# Patient Record
Sex: Female | Born: 1960
Health system: Southern US, Community
[De-identification: ages and names within clinical notes are randomized; demographics above are authoritative.]

## PROBLEM LIST (undated history)

## (undated) DIAGNOSIS — F32A Depression, unspecified: Secondary | ICD-10-CM

## (undated) DIAGNOSIS — F419 Anxiety disorder, unspecified: Secondary | ICD-10-CM

## (undated) DIAGNOSIS — Z9889 Other specified postprocedural states: Secondary | ICD-10-CM

## (undated) DIAGNOSIS — E876 Hypokalemia: Secondary | ICD-10-CM

## (undated) DIAGNOSIS — R112 Nausea with vomiting, unspecified: Secondary | ICD-10-CM

## (undated) HISTORY — PX: AUGMENTATION MAMMAPLASTY: SUR837

## (undated) HISTORY — PX: BACK SURGERY: SHX140

## (undated) HISTORY — DX: Hypokalemia: E87.6

## (undated) HISTORY — PX: BREAST BIOPSY: SHX20

## (undated) HISTORY — PX: FRACTURE SURGERY: SHX138

---

## 1994-11-20 HISTORY — PX: AUGMENTATION MAMMAPLASTY: SUR837

## 2016-05-15 MED FILL — ATOMOXETINE HCL 80 MG CAP: 80 | 30 days supply | Qty: 30 | Fill #0

## 2016-05-15 MED FILL — traZODone HCL 50 MG TABS: 50 | 30 days supply | Qty: 30 | Fill #0

## 2016-06-06 MED FILL — FLUoxetine HCL 20 MG CAPS: 20 | 30 days supply | Qty: 60 | Fill #0

## 2016-06-06 MED FILL — busPIRone HCL 5 MG TABS: 5 | 30 days supply | Qty: 90 | Fill #0

## 2016-06-13 DIAGNOSIS — F5101 Primary insomnia: Secondary | ICD-10-CM | POA: Diagnosis not present

## 2016-06-13 DIAGNOSIS — F411 Generalized anxiety disorder: Secondary | ICD-10-CM | POA: Diagnosis not present

## 2016-06-13 DIAGNOSIS — F1021 Alcohol dependence, in remission: Secondary | ICD-10-CM | POA: Diagnosis not present

## 2016-06-13 DIAGNOSIS — Z79890 Hormone replacement therapy (postmenopausal): Secondary | ICD-10-CM | POA: Diagnosis not present

## 2016-06-21 MED FILL — traZODone HCL 50 MG TABS: 50 | 30 days supply | Qty: 30 | Fill #0

## 2016-07-19 MED FILL — traZODone HCL 50 MG TABS: 50 | 30 days supply | Qty: 30 | Fill #1

## 2016-07-20 MED FILL — FLUoxetine HCL 20 MG CAPS: 20 | 30 days supply | Qty: 60 | Fill #0

## 2016-07-21 MED FILL — DEXTROAMP-AMPHET ER 15 MG C: 15 | 30 days supply | Qty: 30 | Fill #0

## 2016-08-21 MED FILL — traZODone HCL 50 MG TABS: 50 | 30 days supply | Qty: 30 | Fill #2

## 2016-08-21 MED FILL — FLUoxetine HCL 20 MG CAPS: 20 | 30 days supply | Qty: 60 | Fill #1

## 2016-08-31 MED FILL — DEXTROAMP-AMPHET ER 15 MG C: 15 | 30 days supply | Qty: 30 | Fill #0

## 2016-09-01 MED FILL — PROGESTERONE 100 MG CAPSULE: 100 | 30 days supply | Qty: 30 | Fill #0

## 2016-09-01 MED FILL — ESTRADIOL 0.05 MG PATCH: 0.05 | 28 days supply | Qty: 8 | Fill #0

## 2016-10-02 MED FILL — PROGESTERONE 100 MG CAPSULE: 100 | 30 days supply | Qty: 30 | Fill #1

## 2016-10-02 MED FILL — busPIRone HCL 5 MG TABS: 5 | 30 days supply | Qty: 90 | Fill #1

## 2016-10-02 MED FILL — traZODone HCL 50 MG TABS: 50 | 30 days supply | Qty: 30 | Fill #3

## 2016-10-23 MED FILL — DEXTROAMP-AMPHET ER 15 MG C: 15 | 30 days supply | Qty: 30 | Fill #0

## 2016-10-23 MED FILL — FLUoxetine HCL 20 MG CAPS: 20 | 30 days supply | Qty: 60 | Fill #2

## 2016-10-23 MED FILL — ESTRADIOL 0.05 MG PATCH: 0.05 | 28 days supply | Qty: 8 | Fill #1

## 2016-11-14 MED FILL — PROGESTERONE 100 MG CAPSULE: 100 | 30 days supply | Qty: 30 | Fill #2

## 2016-11-14 MED FILL — traZODone HCL 50 MG TABS: 50 | 30 days supply | Qty: 30 | Fill #4

## 2016-11-30 MED FILL — ESTRADIOL 0.05 MG PATCH: 0.05 | 28 days supply | Qty: 8 | Fill #2

## 2016-12-27 DIAGNOSIS — Z5181 Encounter for therapeutic drug level monitoring: Secondary | ICD-10-CM | POA: Diagnosis not present

## 2016-12-27 DIAGNOSIS — E782 Mixed hyperlipidemia: Secondary | ICD-10-CM | POA: Diagnosis not present

## 2016-12-27 DIAGNOSIS — F411 Generalized anxiety disorder: Secondary | ICD-10-CM | POA: Diagnosis not present

## 2016-12-27 DIAGNOSIS — Z0000 Encounter for general adult medical examination without abnormal findings: Secondary | ICD-10-CM | POA: Diagnosis not present

## 2016-12-27 DIAGNOSIS — H9313 Tinnitus, bilateral: Secondary | ICD-10-CM | POA: Diagnosis not present

## 2016-12-27 DIAGNOSIS — I7300 Raynaud's syndrome without gangrene: Secondary | ICD-10-CM | POA: Diagnosis not present

## 2016-12-27 DIAGNOSIS — F1021 Alcohol dependence, in remission: Secondary | ICD-10-CM | POA: Diagnosis not present

## 2016-12-27 MED FILL — GABAPENTIN 300 MG CAPSULE: 300 | 30 days supply | Qty: 90 | Fill #0

## 2017-01-01 MED FILL — PROGESTERONE 100 MG CAPSULE: 100 | 30 days supply | Qty: 30 | Fill #3

## 2017-01-01 MED FILL — traZODone HCL 50 MG TABS: 50 | 30 days supply | Qty: 30 | Fill #5

## 2017-01-24 MED FILL — FLUoxetine HCL 20 MG CAPS: 20 | 30 days supply | Qty: 60 | Fill #3

## 2017-01-25 MED FILL — PROGESTERONE 100 MG CAPSULE: 100 | 30 days supply | Qty: 30 | Fill #4

## 2017-02-26 MED FILL — traZODone HCL 50 MG TABS: 50 | 30 days supply | Qty: 30 | Fill #6

## 2017-02-26 MED FILL — PROGESTERONE 100 MG CAPSULE: 100 | 30 days supply | Qty: 30 | Fill #5

## 2017-02-26 MED FILL — GABAPENTIN 300 MG CAPSULE: 300 | 30 days supply | Qty: 90 | Fill #1

## 2017-02-26 MED FILL — FLUoxetine HCL 20 MG CAPS: 20 | 30 days supply | Qty: 60 | Fill #4

## 2017-02-26 MED FILL — ESTRADIOL 0.05 MG PATCH: 0.05 | 28 days supply | Qty: 8 | Fill #3

## 2017-05-03 MED FILL — ESTRADIOL 0.05 MG PATCH: 0.05 | 28 days supply | Qty: 8 | Fill #4

## 2017-05-03 MED FILL — GABAPENTIN 300 MG CAPSULE: 300 | 30 days supply | Qty: 90 | Fill #2

## 2017-05-03 MED FILL — FLUoxetine HCL 20 MG CAPS: 20 | 30 days supply | Qty: 60 | Fill #5

## 2017-05-03 MED FILL — traZODone HCL 50 MG TABS: 50 | 30 days supply | Qty: 30 | Fill #7

## 2017-07-10 MED FILL — traZODone HCL 50 MG TABS: 50 | 30 days supply | Qty: 30 | Fill #0

## 2017-07-10 MED FILL — GABAPENTIN 300 MG CAPSULE: 300 | 30 days supply | Qty: 90 | Fill #3

## 2017-07-10 MED FILL — ESTRADIOL 0.05 MG PATCH: 0.05 | 28 days supply | Qty: 8 | Fill #5

## 2017-07-10 MED FILL — FLUoxetine HCL 20 MG CAPS: 20 | 30 days supply | Qty: 60 | Fill #6

## 2017-08-14 DIAGNOSIS — L259 Unspecified contact dermatitis, unspecified cause: Secondary | ICD-10-CM | POA: Diagnosis not present

## 2017-08-14 DIAGNOSIS — F5101 Primary insomnia: Secondary | ICD-10-CM | POA: Diagnosis not present

## 2017-08-14 MED FILL — CLOBETASOL PROP 0.05% SPRAY: 0.05 | 14 days supply | Qty: 59 | Fill #0

## 2017-08-14 MED FILL — clonazePAM 1 MG TABS: 1 | 30 days supply | Qty: 30 | Fill #0

## 2017-09-17 MED FILL — GABAPENTIN 300 MG CAPSULE: 300 | 30 days supply | Qty: 90 | Fill #4

## 2017-09-17 MED FILL — traZODone HCL 50 MG TABS: 50 | 30 days supply | Qty: 30 | Fill #1

## 2017-09-17 MED FILL — FLUoxetine HCL 20 MG CAPS: 20 | 30 days supply | Qty: 60 | Fill #0

## 2017-09-27 ENCOUNTER — Encounter (HOSPITAL_COMMUNITY): Payer: Self-pay | Admitting: Emergency Medicine

## 2017-09-27 ENCOUNTER — Ambulatory Visit (HOSPITAL_COMMUNITY)
Admission: EM | Admit: 2017-09-27 | Discharge: 2017-09-27 | Disposition: A | Discharge status: Home / Self Care | Payer: 59 | Attending: Family Medicine | Admitting: Family Medicine

## 2017-09-27 DIAGNOSIS — H10013 Acute follicular conjunctivitis, bilateral: Secondary | ICD-10-CM

## 2017-09-27 HISTORY — DX: Anxiety disorder, unspecified: F41.9

## 2017-09-27 MED ORDER — TOBRAMYCIN-DEXAMETHASONE 0.3-0.1 % OP SUSP: 1.0000 [drp] | Freq: Three times a day (TID) | OPHTHALMIC | 0 refills | Status: DC

## 2017-09-27 NOTE — ED Provider Notes (Signed)
  [place]   [id number] 09/27/17 Arrival Time: 1905   SUBJECTIVE:  Debra Rhodes is a 56 y.o. female who presents to the urgent care with complaint of bilateral eye redness and itching for 3 weeks. PT assumed it was allergies, but PT has worsened itching and pain.      Past Medical History:  Diagnosis Date  . Anxiety    No family history on file. Social History   Socioeconomic History  . Marital status: Single    Spouse name: Not on file  . Number of children: Not on file  . Years of education: Not on file  . Highest education level: Not on file  Social Needs  . Financial resource strain: Not on file  . Food insecurity - worry: Not on file  . Food insecurity - inability: Not on file  . Transportation needs - medical: Not on file  . Transportation needs - non-medical: Not on file  Occupational History  . Not on file  Tobacco Use  . Smoking status: Never Smoker  . Smokeless tobacco: Never Used  Substance and Sexual Activity  . Alcohol use: No    Frequency: Never  . Drug use: No  . Sexual activity: Not on file  Other Topics Concern  . Not on file  Social History Narrative  . Not on file   Current Meds  Medication Sig  . fexofenadine (ALLEGRA) 30 MG tablet Take 30 mg 2 (two) times daily by mouth.  Marland Kitchen FLUoxetine (PROZAC) 20 MG tablet Take 20 mg daily by mouth.  . traZODone (DESYREL) 50 MG tablet Take 50 mg at bedtime by mouth.   Allergies  Allergen Reactions  . Latex Rash      ROS: As per HPI, remainder of ROS negative.   OBJECTIVE:   Vitals:   09/27/17 1938  BP: 120/76  Pulse: (!) 50  Resp: 16  Temp: 98.2 F (36.8 C)  TempSrc: Oral  SpO2: 100%  Weight: 127 lb (57.6 kg)  Height: 5\' 3"  (1.6 m)     General appearance: alert; no distress Eyes: PERRL; EOMI; conjunctiva normal HENT: normocephalic; atraumatic; TMs normal, canal normal, external ears normal without trauma; nasal mucosa normal; oral mucosa normal Neck: supple Lungs:  clear to auscultation bilaterally Heart: regular rate and rhythm Abdomen: soft, non-tender; bowel sounds normal; no masses or organomegaly; no guarding or rebound tenderness Back: no CVA tenderness Extremities: no cyanosis or edema; symmetrical with no gross deformities Skin: warm and dry Neurologic: normal gait; grossly normal Psychological: alert and cooperative; normal mood and affect      Labs:  No results found for this or any previous visit.  Labs Reviewed - No data to display  No results found.     ASSESSMENT & PLAN:  No diagnosis found.  Meds ordered this encounter  Medications  . FLUoxetine (PROZAC) 20 MG tablet    Sig: Take 20 mg daily by mouth.  . traZODone (DESYREL) 50 MG tablet    Sig: Take 50 mg at bedtime by mouth.  . fexofenadine (ALLEGRA) 30 MG tablet    Sig: Take 30 mg 2 (two) times daily by mouth.    Reviewed expectations re: course of current medical issues. Questions answered. Outlined signs and symptoms indicating need for more acute intervention. Patient verbalized understanding. After Visit Summary given.    Procedures:      Robyn Haber, MD 09/27/17 2045

## 2017-09-27 NOTE — ED Triage Notes (Signed)
PT reports bilateral eye redness and itching for 3 weeks. PT assumed it was allergies, but PT has worsened itching and pain.

## 2017-10-30 MED FILL — FLUoxetine HCL 20 MG CAPS: 20 | 30 days supply | Qty: 60 | Fill #1

## 2017-10-30 MED FILL — traZODone HCL 50 MG TABS: 50 | 30 days supply | Qty: 30 | Fill #2

## 2017-10-30 MED FILL — GABAPENTIN 300 MG CAPSULE: 300 | 30 days supply | Qty: 90 | Fill #5

## 2017-11-27 MED FILL — traZODone HCL 50 MG TABS: 50 | 30 days supply | Qty: 30 | Fill #3

## 2017-12-12 ENCOUNTER — Ambulatory Visit: Payer: Self-pay | Admitting: Emergency Medicine

## 2017-12-12 VITALS — BP 100/75 | HR 52 | Temp 97.4°F | Resp 16 | Wt 131.6 lb

## 2017-12-12 DIAGNOSIS — J40 Bronchitis, not specified as acute or chronic: Secondary | ICD-10-CM

## 2017-12-12 MED ORDER — PREDNISONE 50 MG PO TABS: ORAL_TABLET | ORAL | 0 refills | Status: DC

## 2017-12-12 MED ORDER — GUAIFENESIN-CODEINE 100-10 MG/5ML PO SOLN: 5.0000 mL | Freq: Four times a day (QID) | ORAL | 0 refills | Status: DC | PRN

## 2017-12-12 MED ORDER — AZITHROMYCIN 250 MG PO TABS: ORAL_TABLET | ORAL | 0 refills | Status: DC

## 2017-12-12 NOTE — Patient Instructions (Signed)

## 2017-12-12 NOTE — Progress Notes (Signed)
Subjective:     Debra Rhodes is a 57 y.o. female who presents for evaluation of symptoms of a URI. Symptoms include cough described as productive of green sputum and fatigue. Onset of symptoms was 2 weeks ago, and has been gradually worsening since that time. Treatment to date: antihistamines, cough suppressants and decongestants.  The following portions of the patient's history were reviewed and updated as appropriate: allergies and current medications.  Review of Systems Pertinent items noted in HPI and remainder of comprehensive ROS otherwise negative.   Objective:    BP 100/75 (BP Location: Right Arm, Patient Position: Sitting, Cuff Size: Normal)   Pulse (!) 52   Temp (!) 97.4 F (36.3 C) (Oral)   Resp 16   Wt 131 lb 9.6 oz (59.7 kg)   SpO2 99%   BMI 23.31 kg/m  General appearance: alert, cooperative and appears stated age Head: Normocephalic, without obvious abnormality, atraumatic Eyes: negative Ears: normal TM's and external ear canals both ears Nose: Nares normal. Septum midline. Mucosa normal. No drainage or sinus tenderness. Throat: lips, mucosa, and tongue normal; teeth and gums normal Lungs: clear to auscultation bilaterally Heart: regular rate and rhythm Pulses: 2+ and symmetric Skin: Skin color, texture, turgor normal. No rashes or lesions   Assessment:    bronchitis and viral upper respiratory illness   Plan:    Suggested symptomatic OTC remedies. Nasal saline spray for congestion. Zithromax per orders. Follow up as needed.   Counseling provided on avoiding driving or sedating medications while taking codeine.

## 2017-12-26 MED FILL — GABAPENTIN 300 MG CAPSULE: 300 | 30 days supply | Qty: 90 | Fill #6

## 2017-12-28 MED FILL — traZODone HCL 50 MG TABS: 50 | 30 days supply | Qty: 30 | Fill #4

## 2018-01-09 ENCOUNTER — Telehealth: Payer: Self-pay

## 2018-01-09 DIAGNOSIS — F988 Other specified behavioral and emotional disorders with onset usually occurring in childhood and adolescence: Secondary | ICD-10-CM | POA: Diagnosis not present

## 2018-01-09 DIAGNOSIS — Z0000 Encounter for general adult medical examination without abnormal findings: Secondary | ICD-10-CM | POA: Diagnosis not present

## 2018-01-09 DIAGNOSIS — E782 Mixed hyperlipidemia: Secondary | ICD-10-CM | POA: Diagnosis not present

## 2018-01-09 DIAGNOSIS — Z5181 Encounter for therapeutic drug level monitoring: Secondary | ICD-10-CM | POA: Diagnosis not present

## 2018-01-09 DIAGNOSIS — R001 Bradycardia, unspecified: Secondary | ICD-10-CM | POA: Diagnosis not present

## 2018-01-09 NOTE — Telephone Encounter (Signed)
SENT REFERRAL TO SCHEDULING 

## 2018-01-10 MED FILL — METHYLPHENIDATE 5 MG TABLET: 5 | 30 days supply | Qty: 60 | Fill #0

## 2018-01-15 ENCOUNTER — Other Ambulatory Visit: Payer: Self-pay | Admitting: Family Medicine

## 2018-01-15 DIAGNOSIS — Z139 Encounter for screening, unspecified: Secondary | ICD-10-CM

## 2018-01-30 MED FILL — traZODone HCL 50 MG TABS: 50 | 30 days supply | Qty: 30 | Fill #5

## 2018-02-05 ENCOUNTER — Ambulatory Visit
Admission: RE | Admit: 2018-02-05 | Discharge: 2018-02-05 | Disposition: A | Discharge status: Home / Self Care | Payer: 59 | Source: Ambulatory Visit | Attending: Family Medicine | Admitting: Family Medicine

## 2018-02-05 DIAGNOSIS — Z139 Encounter for screening, unspecified: Secondary | ICD-10-CM

## 2018-02-05 DIAGNOSIS — Z1231 Encounter for screening mammogram for malignant neoplasm of breast: Secondary | ICD-10-CM | POA: Diagnosis not present

## 2018-02-26 MED FILL — traZODone HCL 50 MG TABS: 50 | 30 days supply | Qty: 30 | Fill #6

## 2018-02-27 MED FILL — GABAPENTIN 300 MG CAPSULE: 300 | 30 days supply | Qty: 90 | Fill #0

## 2018-03-13 MED FILL — PARoxetine HCL 20 MG TABS: 20 | 30 days supply | Qty: 30 | Fill #0

## 2018-04-01 MED FILL — traZODone HCL 50 MG TABS: 50 | 30 days supply | Qty: 30 | Fill #7

## 2018-04-30 MED FILL — traZODone HCL 50 MG TABS: 50 | 30 days supply | Qty: 30 | Fill #8

## 2018-04-30 MED FILL — PARoxetine HCL 20 MG TABS: 20 | 30 days supply | Qty: 30 | Fill #1

## 2018-06-05 MED FILL — PARoxetine HCL 20 MG TABS: 20 | 30 days supply | Qty: 30 | Fill #2

## 2018-06-05 MED FILL — traZODone HCL 50 MG TABS: 50 | 30 days supply | Qty: 30 | Fill #9

## 2018-07-08 MED FILL — traZODone HCL 50 MG TABS: 50 | 30 days supply | Qty: 30 | Fill #10

## 2018-07-08 MED FILL — GABAPENTIN 300 MG CAPSULE: 300 | 30 days supply | Qty: 90 | Fill #1

## 2018-08-14 MED FILL — metroNIDAZOLE 500 MG TABS: 500 | 7 days supply | Qty: 14 | Fill #0

## 2018-08-14 MED FILL — FLUCONAZOLE 150 MG TABS: 150 | 4 days supply | Qty: 2 | Fill #0

## 2018-08-15 MED FILL — traZODone HCL 50 MG TABS: 50 | 30 days supply | Qty: 30 | Fill #0

## 2018-08-22 DIAGNOSIS — Z23 Encounter for immunization: Secondary | ICD-10-CM | POA: Diagnosis not present

## 2018-08-22 DIAGNOSIS — Q828 Other specified congenital malformations of skin: Secondary | ICD-10-CM | POA: Diagnosis not present

## 2018-08-22 DIAGNOSIS — D485 Neoplasm of uncertain behavior of skin: Secondary | ICD-10-CM | POA: Diagnosis not present

## 2018-09-19 MED FILL — PARoxetine HCL 20 MG TABS: 20 | 30 days supply | Qty: 30 | Fill #0

## 2018-09-19 MED FILL — traZODone HCL 50 MG TABS: 50 | 30 days supply | Qty: 30 | Fill #1

## 2018-10-07 DIAGNOSIS — F988 Other specified behavioral and emotional disorders with onset usually occurring in childhood and adolescence: Secondary | ICD-10-CM | POA: Diagnosis not present

## 2018-10-07 DIAGNOSIS — R5382 Chronic fatigue, unspecified: Secondary | ICD-10-CM | POA: Diagnosis not present

## 2018-10-07 DIAGNOSIS — Z1211 Encounter for screening for malignant neoplasm of colon: Secondary | ICD-10-CM | POA: Diagnosis not present

## 2018-10-21 MED FILL — traZODone HCL 50 MG TABS: 50 | 30 days supply | Qty: 30 | Fill #2

## 2018-11-21 MED FILL — traZODone HCL 50 MG TABS: 50 | 30 days supply | Qty: 30 | Fill #3

## 2018-11-21 MED FILL — PARoxetine HCL 20 MG TABS: 20 | 30 days supply | Qty: 30 | Fill #1

## 2018-12-27 MED FILL — traZODone HCL 50 MG TABS: 50 | 30 days supply | Qty: 30 | Fill #4

## 2019-01-24 MED FILL — traZODone HCL 50 MG TABS: 50 | 30 days supply | Qty: 30 | Fill #5

## 2019-01-24 MED FILL — OLOPATADINE HCL 0.1% EYE DR: 0.1 | 25 days supply | Qty: 5 | Fill #0

## 2019-01-24 MED FILL — PARoxetine HCL 20 MG TABS: 20 | 30 days supply | Qty: 30 | Fill #2

## 2019-02-03 MED FILL — traMADol HCL 50 MG TABS: 50 | 10 days supply | Qty: 40 | Fill #0

## 2019-02-03 MED FILL — predniSONE 20 MG TABS: 20 | 5 days supply | Qty: 10 | Fill #0

## 2019-02-04 DIAGNOSIS — M25561 Pain in right knee: Secondary | ICD-10-CM | POA: Insufficient documentation

## 2019-02-04 HISTORY — DX: Pain in right knee: M25.561

## 2019-02-10 DIAGNOSIS — M7120 Synovial cyst of popliteal space [Baker], unspecified knee: Secondary | ICD-10-CM | POA: Insufficient documentation

## 2019-02-10 MED FILL — OLOPATADINE HCL 0.1 % SOLN: 0.1 | 25 days supply | Qty: 5 | Fill #1

## 2019-02-10 MED FILL — GABAPENTIN 300 MG CAPSULE: 300 | 30 days supply | Qty: 90 | Fill #0

## 2019-02-10 MED FILL — PARoxetine HCL 20 MG TABS: 20 | 30 days supply | Qty: 30 | Fill #3

## 2019-02-11 MED FILL — traZODone HCL 100 MG TABS: 100 | 90 days supply | Qty: 90 | Fill #0

## 2019-02-17 ENCOUNTER — Other Ambulatory Visit (HOSPITAL_COMMUNITY)
Admission: RE | Admit: 2019-02-17 | Discharge: 2019-02-17 | Disposition: A | Discharge status: Home / Self Care | Payer: No Typology Code available for payment source | Source: Ambulatory Visit | Attending: Family Medicine | Admitting: Family Medicine

## 2019-02-17 ENCOUNTER — Other Ambulatory Visit: Payer: Self-pay | Admitting: Family Medicine

## 2019-02-17 DIAGNOSIS — Z01411 Encounter for gynecological examination (general) (routine) with abnormal findings: Secondary | ICD-10-CM | POA: Insufficient documentation

## 2019-02-19 LAB — CYTOLOGY - PAP
Adequacy: ABSENT
Diagnosis: NEGATIVE
HPV: NOT DETECTED

## 2019-03-20 MED FILL — FLUCONAZOLE 150 MG TABS: 150 | 3 days supply | Qty: 2 | Fill #0

## 2019-03-20 MED FILL — METRONIDAZOLE 500 MG TABS: 500 | 7 days supply | Qty: 14 | Fill #0

## 2019-03-20 MED FILL — traMADol HCL 50 MG TABS: 50 | 10 days supply | Qty: 40 | Fill #1

## 2019-05-05 NOTE — H&P (Signed)
TOTAL KNEE ADMISSION H&P  Patient is being admitted for right total knee arthroplasty.  Subjective:  Chief Complaint:      Right knee primary OA / pain  HPI: Debra Rhodes, 58 y.o. female, has a history of pain and functional disability in the right knee due to arthritis and has failed non-surgical conservative treatments for greater than 12 weeks to includeNSAID's and/or analgesics, corticosteriod injections and activity modification.  Onset of symptoms was gradual, starting >10 years ago with gradually worsening course since that time. The patient noted no past surgery on the right knee(s).  Patient currently rates pain in the right knee(s) at 6 out of 10 with activity. Patient has night pain, worsening of pain with activity and weight bearing, pain that interferes with activities of daily living and pain with passive range of motion.  Patient has evidence of periarticular osteophytes and joint space narrowing by imaging studies.  There is no active infection. Risks, benefits and expectations were discussed with the patient.  Risks including but not limited to the risk of anesthesia, blood clots, nerve damage, blood vessel damage, failure of the prosthesis, infection and up to and including death.  Patient understand the risks, benefits and expectations and wishes to proceed with surgery.    PCP: Maurice Small, MD  D/C Plans:       Home   Post-op Meds:       No Rx given  Tranexamic Acid:      To be given - IV   Decadron:      Is to be given  FYI:     ASA  Norco  Low resting HR  DME:    Rx given for - RW & 3-n-1  PT:   OPPT  Pharmacy: [facility]    Past Medical History:  Diagnosis Date  . Anxiety     Past Surgical History:  Procedure Laterality Date  . AUGMENTATION MAMMAPLASTY Bilateral   . BACK SURGERY    . CESAREAN SECTION    . FRACTURE SURGERY      No current facility-administered medications for this encounter.    Current Outpatient Medications   Medication Sig Dispense Refill Last Dose  . Biotin w/ Vitamins C & E (HAIR/SKIN/NAILS PO) Take 1 tablet by mouth daily.     . fexofenadine (ALLEGRA) 180 MG tablet Take 180 mg by mouth daily.      Marland Kitchen gabapentin (NEURONTIN) 100 MG capsule Take 100 mg by mouth daily as needed (pain).      . Magnesium 500 MG TABS Take 500 mg by mouth daily.     Marland Kitchen PARoxetine (PAXIL) 20 MG tablet Take 20 mg by mouth daily.     . traZODone (DESYREL) 50 MG tablet Take 50-75 mg by mouth at bedtime.      Marland Kitchen azithromycin (ZITHROMAX) 250 MG tablet Take 2 tablets today, then one daily until finished (Patient not taking: Reported on 05/02/2019) 6 tablet 0 Not Taking at Unknown time  . guaiFENesin-codeine 100-10 MG/5ML syrup Take 5 mLs by mouth every 6 (six) hours as needed for cough. (Patient not taking: Reported on 05/02/2019) 120 mL 0 Not Taking at Unknown time  . predniSONE (DELTASONE) 50 MG tablet Take 1 tablet daily with food (Patient not taking: Reported on 05/02/2019) 5 tablet 0 Not Taking at Unknown time  . tobramycin-dexamethasone (TOBRADEX) ophthalmic solution Place 1 drop 3 (three) times daily into both eyes. (Patient not taking: Reported on 12/12/2017) 5 mL 0  Allergies  Allergen Reactions  . Latex Shortness Of Breath, Swelling and Rash    Social History   Tobacco Use  . Smoking status: Never Smoker  . Smokeless tobacco: Never Used  Substance Use Topics  . Alcohol use: No    Frequency: Never       Review of Systems  Constitutional: Negative.   HENT: Negative.   Eyes: Negative.   Respiratory: Negative.   Cardiovascular: Negative.   Gastrointestinal: Negative.   Genitourinary: Negative.   Musculoskeletal: Positive for joint pain.  Skin: Negative.   Neurological: Negative.   Endo/Heme/Allergies: Negative.   Psychiatric/Behavioral: The patient is nervous/anxious.     Objective:  Physical Exam  Constitutional: She is oriented to person, place, and time. She appears well-developed.  HENT:  Head:  Normocephalic.  Eyes: Pupils are equal, round, and reactive to light.  Neck: Neck supple. No JVD present. No tracheal deviation present. No thyromegaly present.  Cardiovascular: Normal rate, regular rhythm and intact distal pulses.  Respiratory: Effort normal and breath sounds normal. No respiratory distress. She has no wheezes.  GI: Soft. There is no abdominal tenderness. There is no guarding.  Musculoskeletal:     Right knee: She exhibits swelling and bony tenderness. She exhibits no ecchymosis, no deformity, no laceration and no erythema. Tenderness found.  Lymphadenopathy:    She has no cervical adenopathy.  Neurological: She is alert and oriented to person, place, and time. A sensory deficit (left foot numbness) is present.  Skin: Skin is warm and dry.  Psychiatric: She has a normal mood and affect.      Labs:  Estimated body mass index is 23.31 kg/m as calculated from the following:   Height as of 09/27/17: 5\' 3"  (1.6 m).   Weight as of 12/12/17: 59.7 kg.   Imaging Review  Plain radiographs demonstrate severe degenerative joint disease of the right knee(s).  The bone quality appears to be good for age and reported activity level.      Assessment/Plan:  End stage arthritis, right knee   The patient history, physical examination, clinical judgment of the provider and imaging studies are consistent with end stage degenerative joint disease of the right knee(s) and total knee arthroplasty is deemed medically necessary. The treatment options including medical management, injection therapy arthroscopy and arthroplasty were discussed at length. The risks and benefits of total knee arthroplasty were presented and reviewed. The risks due to aseptic loosening, infection, stiffness, patella tracking problems, thromboembolic complications and other imponderables were discussed. The patient acknowledged the explanation, agreed to proceed with the plan and consent was signed. Patient is  being admitted for inpatient treatment for surgery, pain control, PT, OT, prophylactic antibiotics, VTE prophylaxis, progressive ambulation and ADL's and discharge planning. The patient is planning to be discharged home.    Patient's anticipated LOS is less than 2 midnights, meeting these requirements: - Younger than 54 - Lives within 1 hour of care - Has a competent adult at home to recover with post-op recover - NO history of  - Chronic pain requiring opiods  - Diabetes  - Coronary Artery Disease  - Heart failure  - Heart attack  - Stroke  - DVT/VTE  - Cardiac arrhythmia  - Respiratory Failure/COPD  - Renal failure  - Anemia  - Advanced Liver disease    [place]. Nikia Mangino   PA-C  05/05/2019, 11:11 AM

## 2019-05-07 NOTE — Patient Instructions (Addendum)
Debra Rhodes    Your procedure is scheduled on: 05-13-2019   Report to [facility]  Entrance Report to admitting at 1:15 PM   YOU NEED TO HAVE A COVID 19 TEST ON_6/18______ @_12 :00______, THIS TEST MUST BE DONE BEFORE SURGERY, COME TO [place] ENTRANCE  ONCE YOUR COVID TEST IS COMPLETED, PLEASE BEGIN THE QUARANTINE INSTRUCTIONS AS OUTLINED IN YOUR HANDOUT.   Call this number if you have problems the morning of surgery [phone]    Remember:  [place], NO CHEWING GUM CANDY OR MINTS.   NO SOLID FOOD AFTER MIDNIGHT THE NIGHT PRIOR TO SURGERY. NOTHING BY MOUTH EXCEPT CLEAR LIQUIDS UNTIL 9563 PM. . PLEASE FINISH ENSURE DRINK PER SURGEON ORDER 3 HOURS PRIOR TO SCHEDULED SURGERY TIME WHICH NEEDS TO BE COMPLETED AT 1245 PM.    CLEAR LIQUID DIET   Foods Allowed                                                                     Foods Excluded  Coffee and tea, regular and decaf                             liquids that you cannot  Plain Jell-O in any flavor                                             see through such as: Fruit ices (not with fruit pulp)                                     milk, soups, orange juice  Iced Popsicles                                    All solid food Carbonated beverages, regular and diet                                    Cranberry, grape and apple juices Sports drinks like Gatorade Lightly seasoned clear broth or consume(fat free) Sugar, honey syrup  Sample Menu Breakfast                                Lunch                                     Supper Cranberry juice                    Beef broth  Chicken broth Jell-O                                     Grape juice                           Apple juice Coffee or tea                        Jell-O                                      Popsicle                 Coffee or tea                        Coffee or tea  _____________________________________________________________________     Take these medicines the morning of surgery with A SIP OF WATER: GABAPENTIN (NEURONTIN), PAROXETINE (PAXIL), FEXOFENADINE (ALLEGRA)                                You may not have any metal on your body including hair pins and              piercings  Do not wear jewelry, make-up, lotions, powders or perfumes, deodorant             Do not wear nail polish.  Do not shave  48 hours prior to surgery.                 Do not bring valuables to the hospital. [place].  Contacts, dentures or bridgework may not be worn into surgery.  Leave suitcase in the car. After surgery it may be brought to your room.     _____________________________________________________________________             [facility] - Preparing for Surgery Before surgery, you can play an important role.  Because skin is not sterile, your skin needs to be as free of germs as possible.  You can reduce the number of germs on your skin by washing with CHG (chlorahexidine gluconate) soap before surgery.  CHG is an antiseptic cleaner which kills germs and bonds with the skin to continue killing germs even after washing. Please DO NOT use if you have an allergy to CHG or antibacterial soaps.  If your skin becomes reddened/irritated stop using the CHG and inform your nurse when you arrive at Short Stay. Do not shave (including legs and underarms) for at least 48 hours prior to the first CHG shower.  You may shave your face/neck. Please follow these instructions carefully:  1.  Shower with CHG Soap the night before surgery and the  morning of Surgery.  2.  If you choose to wash your hair, wash your hair first as usual with your  normal  shampoo.  3.  After you shampoo, rinse your hair and body thoroughly to remove the  shampoo.  4.  Use CHG as you would any other liquid soap.  You can apply chg directly  to the skin and wash                       Gently with a scrungie or clean washcloth.  5.  Apply the CHG Soap to your body ONLY FROM THE NECK DOWN.   Do not use on face/ open                           Wound or open sores. Avoid contact with eyes, ears mouth and genitals (private parts).                       Wash face,  Genitals (private parts) with your normal soap.             6.  Wash thoroughly, paying special attention to the area where your surgery  will be performed.  7.  Thoroughly rinse your body with warm water from the neck down.  8.  DO NOT shower/wash with your normal soap after using and rinsing off  the CHG Soap.                9.  Pat yourself dry with a clean towel.            10.  Wear clean pajamas.            11.  Place clean sheets on your bed the night of your first shower and do not  sleep with pets. Day of Surgery : Do not apply any lotions/deodorants the morning of surgery.  Please wear clean clothes to the hospital/surgery center.  FAILURE TO FOLLOW THESE INSTRUCTIONS MAY RESULT IN THE CANCELLATION OF YOUR SURGERY PATIENT SIGNATURE_________________________________  NURSE SIGNATURE__________________________________  ________________________________________________________________________   Adam Phenix  An incentive spirometer is a tool that can help keep your lungs clear and active. This tool measures how well you are filling your lungs with each breath. Taking long deep breaths may help reverse or decrease the chance of developing breathing (pulmonary) problems (especially infection) following:  A long period of time when you are unable to move or be active. BEFORE THE PROCEDURE   If the spirometer includes an indicator to show your best effort, your nurse or respiratory therapist will set it to a desired goal.  If possible, sit up straight or lean slightly forward.  Try not to slouch.  Hold the incentive spirometer in an upright position. INSTRUCTIONS FOR USE  1. Sit on the edge of your bed if possible, or sit up as far as you can in bed or on a chair. 2. Hold the incentive spirometer in an upright position. 3. Breathe out normally. 4. Place the mouthpiece in your mouth and seal your lips tightly around it. 5. Breathe in slowly and as deeply as possible, raising the piston or the ball toward the top of the column. 6. Hold your breath for 3-5 seconds or for as long as possible. Allow the piston or ball to fall to the bottom of the column. 7. Remove the mouthpiece from your mouth and breathe out normally. 8. Rest for a few seconds and repeat Steps 1 through 7 at least 10 times every 1-2 hours when you are awake. Take your time and take a few normal breaths between deep breaths. 9. The spirometer may include an indicator to show  your best effort. Use the indicator as a goal to work toward during each repetition. 10. After each set of 10 deep breaths, practice coughing to be sure your lungs are clear. If you have an incision (the cut made at the time of surgery), support your incision when coughing by placing a pillow or rolled up towels firmly against it. Once you are able to get out of bed, walk around indoors and cough well. You may stop using the incentive spirometer when instructed by your caregiver.  RISKS AND COMPLICATIONS  Take your time so you do not get dizzy or light-headed.  If you are in pain, you may need to take or ask for pain medication before doing incentive spirometry. It is harder to take a deep breath if you are having pain. AFTER USE  Rest and breathe slowly and easily.  It can be helpful to keep track of a log of your progress. Your caregiver can provide you with a simple table to help with this. If you are using the spirometer at home, follow these instructions: [place] IF:   You are having difficultly using the  spirometer.  You have trouble using the spirometer as often as instructed.  Your pain medication is not giving enough relief while using the spirometer.  You develop fever of 100.5 F (38.1 C) or higher. SEEK IMMEDIATE MEDICAL CARE IF:   You cough up bloody sputum that had not been present before.  You develop fever of 102 F (38.9 C) or greater.  You develop worsening pain at or near the incision site. MAKE SURE YOU:   Understand these instructions.  Will watch your condition.  Will get help right away if you are not doing well or get worse. Document Released: 03/19/2007 Document Revised: 01/29/2012 Document Reviewed: 05/20/2007 Exit[employer] Patient Information 2014 Exit[employer], [place].   ________________________________________________________________________  WHAT IS A BLOOD TRANSFUSION? Blood Transfusion Information  A transfusion is the replacement of blood or some of its parts. Blood is made up of multiple cells which provide different functions.  Red blood cells carry oxygen and are used for blood loss replacement.  White blood cells fight against infection.  Platelets control bleeding.  Plasma helps clot blood.  Other blood products are available for specialized needs, such as hemophilia or other clotting disorders. BEFORE THE TRANSFUSION  Who gives blood for transfusions?   Healthy volunteers who are fully evaluated to make sure their blood is safe. This is blood bank blood. Transfusion therapy is the safest it has ever been in the practice of medicine. Before blood is taken from a donor, a complete history is taken to make sure that person has no history of diseases nor engages in risky social behavior (examples are intravenous drug use or sexual activity with multiple partners). The donor's travel history is screened to minimize risk of transmitting infections, such as malaria. The donated blood is tested for signs of infectious diseases, such as HIV and hepatitis.  The blood is then tested to be sure it is compatible with you in order to minimize the chance of a transfusion reaction. If you or a relative donates blood, this is often done in anticipation of surgery and is not appropriate for emergency situations. It takes many days to process the donated blood. RISKS AND COMPLICATIONS Although transfusion therapy is very safe and saves many lives, the main dangers of transfusion include:   Getting an infectious disease.  Developing a transfusion reaction. This is an allergic reaction to  something in the blood you were given. Every precaution is taken to prevent this. The decision to have a blood transfusion has been considered carefully by your caregiver before blood is given. Blood is not given unless the benefits outweigh the risks. AFTER THE TRANSFUSION  Right after receiving a blood transfusion, you will usually feel much better and more energetic. This is especially true if your red blood cells have gotten low (anemic). The transfusion raises the level of the red blood cells which carry oxygen, and this usually causes an energy increase.  The nurse administering the transfusion will monitor you carefully for complications. HOME CARE INSTRUCTIONS  No special instructions are needed after a transfusion. You may find your energy is better. Speak with your caregiver about any limitations on activity for underlying diseases you may have. SEEK MEDICAL CARE IF:   Your condition is not improving after your transfusion.  You develop redness or irritation at the intravenous (IV) site. SEEK IMMEDIATE MEDICAL CARE IF:  Any of the following symptoms occur over the next 12 hours:  Shaking chills.  You have a temperature by mouth above 102 F (38.9 C), not controlled by medicine.  Chest, back, or muscle pain.  People around you feel you are not acting correctly or are confused.  Shortness of breath or difficulty breathing.  Dizziness and fainting.  You  get a rash or develop hives.  You have a decrease in urine output.  Your urine turns a dark color or changes to pink, red, or brown. Any of the following symptoms occur over the next 10 days:  You have a temperature by mouth above 102 F (38.9 C), not controlled by medicine.  Shortness of breath.  Weakness after normal activity.  The white part of the eye turns yellow (jaundice).  You have a decrease in the amount of urine or are urinating less often.  Your urine turns a dark color or changes to pink, red, or brown. Document Released: 11/03/2000 Document Revised: 01/29/2012 Document Reviewed: 06/22/2008 [facility] Patient Information 2014 [place], [place].  _______________________________________________________________________

## 2019-05-08 ENCOUNTER — Encounter (HOSPITAL_COMMUNITY): Payer: Self-pay

## 2019-05-08 ENCOUNTER — Other Ambulatory Visit: Payer: Self-pay

## 2019-05-08 ENCOUNTER — Encounter (HOSPITAL_COMMUNITY)
Admission: RE | Admit: 2019-05-08 | Discharge: 2019-05-08 | Disposition: A | Discharge status: Home / Self Care | Payer: No Typology Code available for payment source | Source: Ambulatory Visit | Attending: Orthopedic Surgery | Admitting: Orthopedic Surgery

## 2019-05-08 DIAGNOSIS — Z01812 Encounter for preprocedural laboratory examination: Secondary | ICD-10-CM | POA: Insufficient documentation

## 2019-05-08 HISTORY — DX: Other specified postprocedural states: Z98.890

## 2019-05-08 HISTORY — DX: Other specified postprocedural states: R11.2

## 2019-05-08 LAB — CBC
HCT: 39.6 % (ref 36.0–46.0)
Hemoglobin: 12.9 g/dL (ref 12.0–15.0)
MCH: 30.5 pg (ref 26.0–34.0)
MCHC: 32.6 g/dL (ref 30.0–36.0)
MCV: 93.6 fL (ref 80.0–100.0)
Platelets: 223 10*3/uL (ref 150–400)
RBC: 4.23 MIL/uL (ref 3.87–5.11)
RDW: 12.6 % (ref 11.5–15.5)
WBC: 3.7 10*3/uL — ABNORMAL LOW (ref 4.0–10.5)
nRBC: 0 % (ref 0.0–0.2)

## 2019-05-08 LAB — SURGICAL PCR SCREEN
MRSA, PCR: NEGATIVE
Staphylococcus aureus: NEGATIVE

## 2019-05-08 LAB — ABO/RH: ABO/RH(D): A POS

## 2019-05-09 ENCOUNTER — Other Ambulatory Visit (HOSPITAL_COMMUNITY)
Admission: RE | Admit: 2019-05-09 | Discharge: 2019-05-09 | Disposition: A | Discharge status: Home / Self Care | Payer: No Typology Code available for payment source | Source: Ambulatory Visit | Attending: Orthopedic Surgery | Admitting: Orthopedic Surgery

## 2019-05-09 DIAGNOSIS — Z01812 Encounter for preprocedural laboratory examination: Secondary | ICD-10-CM | POA: Diagnosis not present

## 2019-05-09 LAB — SARS CORONAVIRUS 2 (TAT 6-24 HRS): SARS Coronavirus 2: NEGATIVE

## 2019-05-09 MED FILL — PARoxetine HCL 20 MG TABS: 20 | 30 days supply | Qty: 30 | Fill #0

## 2019-05-09 MED FILL — traZODone HCL 100 MG TABS: 100 | 90 days supply | Qty: 90 | Fill #1

## 2019-05-13 ENCOUNTER — Other Ambulatory Visit: Payer: Self-pay

## 2019-05-13 ENCOUNTER — Ambulatory Visit (HOSPITAL_COMMUNITY): Payer: No Typology Code available for payment source | Admitting: Certified Registered Nurse Anesthetist

## 2019-05-13 ENCOUNTER — Encounter (HOSPITAL_COMMUNITY): Payer: Self-pay | Admitting: *Deleted

## 2019-05-13 ENCOUNTER — Observation Stay (HOSPITAL_COMMUNITY)
Admission: RE | Admit: 2019-05-13 | Discharge: 2019-05-14 | Disposition: A | Discharge status: Home / Self Care | Payer: No Typology Code available for payment source | Attending: Orthopedic Surgery | Admitting: Orthopedic Surgery

## 2019-05-13 ENCOUNTER — Encounter (HOSPITAL_COMMUNITY)
Admission: RE | Disposition: A | Discharge status: Home / Self Care | Payer: Self-pay | Source: Home / Self Care | Attending: Orthopedic Surgery

## 2019-05-13 ENCOUNTER — Ambulatory Visit (HOSPITAL_COMMUNITY): Payer: No Typology Code available for payment source | Admitting: Physician Assistant

## 2019-05-13 DIAGNOSIS — E663 Overweight: Secondary | ICD-10-CM | POA: Diagnosis present

## 2019-05-13 DIAGNOSIS — F419 Anxiety disorder, unspecified: Secondary | ICD-10-CM | POA: Diagnosis not present

## 2019-05-13 DIAGNOSIS — Z79899 Other long term (current) drug therapy: Secondary | ICD-10-CM | POA: Insufficient documentation

## 2019-05-13 DIAGNOSIS — M25761 Osteophyte, right knee: Secondary | ICD-10-CM | POA: Diagnosis not present

## 2019-05-13 DIAGNOSIS — M1711 Unilateral primary osteoarthritis, right knee: Principal | ICD-10-CM | POA: Insufficient documentation

## 2019-05-13 DIAGNOSIS — Z96651 Presence of right artificial knee joint: Secondary | ICD-10-CM

## 2019-05-13 HISTORY — PX: TOTAL KNEE ARTHROPLASTY: SHX125

## 2019-05-13 LAB — TYPE AND SCREEN
ABO/RH(D): A POS
Antibody Screen: NEGATIVE

## 2019-05-13 SURGERY — ARTHROPLASTY, KNEE, TOTAL
Anesthesia: Spinal | Site: Knee | Laterality: Right

## 2019-05-13 MED ORDER — BUPIVACAINE IN DEXTROSE 0.75-8.25 % IT SOLN
INTRATHECAL | Status: DC | PRN
  Administered 2019-05-13: 1.8 mL via INTRATHECAL

## 2019-05-13 MED ORDER — CELECOXIB 200 MG PO CAPS
200.0000 mg | ORAL_CAPSULE | Freq: Two times a day (BID) | ORAL | Status: DC
  Administered 2019-05-13 – 2019-05-14 (×2): 200 mg via ORAL
  Filled 2019-05-13 (×2): qty 1

## 2019-05-13 MED ORDER — CEFAZOLIN SODIUM-DEXTROSE 2-4 GM/100ML-% IV SOLN
2.0000 g | Freq: Four times a day (QID) | INTRAVENOUS | Status: AC
  Administered 2019-05-13 – 2019-05-14 (×2): 2 g via INTRAVENOUS
  Filled 2019-05-13 (×2): qty 100

## 2019-05-13 MED ORDER — LACTATED RINGERS IV SOLN
INTRAVENOUS | Status: DC
  Administered 2019-05-13 (×3): via INTRAVENOUS

## 2019-05-13 MED ORDER — HYDROMORPHONE HCL 1 MG/ML IJ SOLN
INTRAMUSCULAR | Status: AC
  Filled 2019-05-13: qty 2

## 2019-05-13 MED ORDER — HYDROCODONE-ACETAMINOPHEN 7.5-325 MG PO TABS
1.0000 | ORAL_TABLET | ORAL | Status: DC | PRN
  Administered 2019-05-13 – 2019-05-14 (×2): 1 via ORAL
  Filled 2019-05-13 (×2): qty 1

## 2019-05-13 MED ORDER — TRAZODONE HCL 50 MG PO TABS
50.0000 mg | ORAL_TABLET | Freq: Every day | ORAL | Status: DC
  Filled 2019-05-13: qty 1

## 2019-05-13 MED ORDER — PROPOFOL 10 MG/ML IV BOLUS
INTRAVENOUS | Status: AC
  Filled 2019-05-13: qty 80

## 2019-05-13 MED ORDER — PROPOFOL 10 MG/ML IV BOLUS
INTRAVENOUS | Status: DC | PRN
  Administered 2019-05-13: 30 mg via INTRAVENOUS

## 2019-05-13 MED ORDER — PROMETHAZINE HCL 25 MG/ML IJ SOLN: 6.2500 mg | INTRAMUSCULAR | Status: DC | PRN

## 2019-05-13 MED ORDER — HYDROMORPHONE HCL 1 MG/ML IJ SOLN
0.2500 mg | INTRAMUSCULAR | Status: DC | PRN
  Administered 2019-05-13 (×4): 0.5 mg via INTRAVENOUS

## 2019-05-13 MED ORDER — MIDAZOLAM HCL 2 MG/2ML IJ SOLN
INTRAMUSCULAR | Status: AC
  Filled 2019-05-13: qty 2

## 2019-05-13 MED ORDER — PHENOL 1.4 % MT LIQD: 1.0000 | OROMUCOSAL | Status: DC | PRN

## 2019-05-13 MED ORDER — ROPIVACAINE HCL 5 MG/ML IJ SOLN
INTRAMUSCULAR | Status: DC | PRN
  Administered 2019-05-13: 20 mL via PERINEURAL

## 2019-05-13 MED ORDER — OXYCODONE HCL 5 MG PO TABS: 5.0000 mg | ORAL_TABLET | Freq: Once | ORAL | Status: DC | PRN

## 2019-05-13 MED ORDER — LORATADINE 10 MG PO TABS
10.0000 mg | ORAL_TABLET | Freq: Every day | ORAL | Status: DC
  Administered 2019-05-14: 10 mg via ORAL
  Filled 2019-05-13: qty 1

## 2019-05-13 MED ORDER — DOCUSATE SODIUM 100 MG PO CAPS
100.0000 mg | ORAL_CAPSULE | Freq: Two times a day (BID) | ORAL | Status: DC
  Administered 2019-05-13 – 2019-05-14 (×2): 100 mg via ORAL
  Filled 2019-05-13 (×2): qty 1

## 2019-05-13 MED ORDER — TRANEXAMIC ACID-NACL 1000-0.7 MG/100ML-% IV SOLN
1000.0000 mg | Freq: Once | INTRAVENOUS | Status: AC
  Administered 2019-05-13: 1000 mg via INTRAVENOUS
  Filled 2019-05-13: qty 100

## 2019-05-13 MED ORDER — ACETAMINOPHEN 325 MG PO TABS: 325.0000 mg | ORAL_TABLET | Freq: Four times a day (QID) | ORAL | Status: DC | PRN

## 2019-05-13 MED ORDER — MIDAZOLAM HCL 5 MG/5ML IJ SOLN
INTRAMUSCULAR | Status: DC | PRN
  Administered 2019-05-13: 2 mg via INTRAVENOUS

## 2019-05-13 MED ORDER — POLYETHYLENE GLYCOL 3350 17 G PO PACK
17.0000 g | PACK | Freq: Two times a day (BID) | ORAL | Status: DC
  Administered 2019-05-13 – 2019-05-14 (×2): 17 g via ORAL
  Filled 2019-05-13 (×2): qty 1

## 2019-05-13 MED ORDER — SODIUM CHLORIDE (PF) 0.9 % IJ SOLN
INTRAMUSCULAR | Status: DC | PRN
  Administered 2019-05-13: 30 mL

## 2019-05-13 MED ORDER — CEFAZOLIN SODIUM-DEXTROSE 2-4 GM/100ML-% IV SOLN
2.0000 g | INTRAVENOUS | Status: AC
  Administered 2019-05-13: 15:00:00 2 g via INTRAVENOUS
  Filled 2019-05-13: qty 100

## 2019-05-13 MED ORDER — PROPOFOL 10 MG/ML IV BOLUS
INTRAVENOUS | Status: AC
  Filled 2019-05-13: qty 20

## 2019-05-13 MED ORDER — METHOCARBAMOL 500 MG PO TABS
500.0000 mg | ORAL_TABLET | Freq: Four times a day (QID) | ORAL | Status: DC | PRN
  Administered 2019-05-14 (×2): 500 mg via ORAL
  Filled 2019-05-13 (×2): qty 1

## 2019-05-13 MED ORDER — HYDROMORPHONE HCL 1 MG/ML IJ SOLN: 0.2500 mg | INTRAMUSCULAR | Status: DC | PRN

## 2019-05-13 MED ORDER — MAGNESIUM CITRATE PO SOLN: 1.0000 | Freq: Once | ORAL | Status: DC | PRN

## 2019-05-13 MED ORDER — HYDROCODONE-ACETAMINOPHEN 5-325 MG PO TABS
1.0000 | ORAL_TABLET | ORAL | Status: DC | PRN
  Administered 2019-05-14: 2 via ORAL
  Administered 2019-05-14: 1 via ORAL
  Filled 2019-05-13: qty 1
  Filled 2019-05-13: qty 2

## 2019-05-13 MED ORDER — SODIUM CHLORIDE 0.9 % IR SOLN
Status: DC | PRN
  Administered 2019-05-13: 1000 mL

## 2019-05-13 MED ORDER — FERROUS SULFATE 325 (65 FE) MG PO TABS
325.0000 mg | ORAL_TABLET | Freq: Two times a day (BID) | ORAL | Status: DC
  Administered 2019-05-14: 325 mg via ORAL
  Filled 2019-05-13: qty 1

## 2019-05-13 MED ORDER — METHOCARBAMOL 500 MG IVPB - SIMPLE MED
INTRAVENOUS | Status: AC
  Filled 2019-05-13: qty 50

## 2019-05-13 MED ORDER — DIPHENHYDRAMINE HCL 12.5 MG/5ML PO ELIX: 12.5000 mg | ORAL_SOLUTION | ORAL | Status: DC | PRN

## 2019-05-13 MED ORDER — ONDANSETRON HCL 4 MG/2ML IJ SOLN
INTRAMUSCULAR | Status: AC
  Filled 2019-05-13: qty 2

## 2019-05-13 MED ORDER — KETOROLAC TROMETHAMINE 30 MG/ML IJ SOLN
INTRAMUSCULAR | Status: AC
  Filled 2019-05-13: qty 1

## 2019-05-13 MED ORDER — DEXAMETHASONE SODIUM PHOSPHATE 10 MG/ML IJ SOLN
10.0000 mg | Freq: Once | INTRAMUSCULAR | Status: AC
  Administered 2019-05-13: 16:00:00 10 mg via INTRAVENOUS

## 2019-05-13 MED ORDER — POVIDONE-IODINE 10 % EX SWAB
2.0000 "application " | Freq: Once | CUTANEOUS | Status: AC
  Administered 2019-05-13: 2 via TOPICAL

## 2019-05-13 MED ORDER — ONDANSETRON HCL 4 MG/2ML IJ SOLN: 4.0000 mg | Freq: Four times a day (QID) | INTRAMUSCULAR | Status: DC | PRN

## 2019-05-13 MED ORDER — ONDANSETRON HCL 4 MG PO TABS: 4.0000 mg | ORAL_TABLET | Freq: Four times a day (QID) | ORAL | Status: DC | PRN

## 2019-05-13 MED ORDER — METOCLOPRAMIDE HCL 5 MG/ML IJ SOLN: 5.0000 mg | Freq: Three times a day (TID) | INTRAMUSCULAR | Status: DC | PRN

## 2019-05-13 MED ORDER — SODIUM CHLORIDE 0.9 % IV SOLN
INTRAVENOUS | Status: DC
  Administered 2019-05-13: 18:00:00 via INTRAVENOUS

## 2019-05-13 MED ORDER — ALUM & MAG HYDROXIDE-SIMETH 200-200-20 MG/5ML PO SUSP: 15.0000 mL | ORAL | Status: DC | PRN

## 2019-05-13 MED ORDER — ONDANSETRON HCL 4 MG/2ML IJ SOLN
INTRAMUSCULAR | Status: DC | PRN
  Administered 2019-05-13: 4 mg via INTRAVENOUS

## 2019-05-13 MED ORDER — METOCLOPRAMIDE HCL 5 MG PO TABS: 5.0000 mg | ORAL_TABLET | Freq: Three times a day (TID) | ORAL | Status: DC | PRN

## 2019-05-13 MED ORDER — STERILE WATER FOR IRRIGATION IR SOLN
Status: DC | PRN
  Administered 2019-05-13 (×2): 1000 mL

## 2019-05-13 MED ORDER — METHOCARBAMOL 500 MG IVPB - SIMPLE MED
500.0000 mg | Freq: Four times a day (QID) | INTRAVENOUS | Status: DC | PRN
  Administered 2019-05-13: 500 mg via INTRAVENOUS
  Filled 2019-05-13: qty 50

## 2019-05-13 MED ORDER — ASPIRIN 81 MG PO CHEW
81.0000 mg | CHEWABLE_TABLET | Freq: Two times a day (BID) | ORAL | Status: DC
  Administered 2019-05-14: 81 mg via ORAL
  Filled 2019-05-13: qty 1

## 2019-05-13 MED ORDER — EPHEDRINE 5 MG/ML INJ
INTRAVENOUS | Status: AC
  Filled 2019-05-13: qty 10

## 2019-05-13 MED ORDER — SODIUM CHLORIDE (PF) 0.9 % IJ SOLN
INTRAMUSCULAR | Status: AC
  Filled 2019-05-13: qty 50

## 2019-05-13 MED ORDER — BUPIVACAINE-EPINEPHRINE (PF) 0.25% -1:200000 IJ SOLN
INTRAMUSCULAR | Status: DC | PRN
  Administered 2019-05-13: 30 mL

## 2019-05-13 MED ORDER — PHENYLEPHRINE 40 MCG/ML (10ML) SYRINGE FOR IV PUSH (FOR BLOOD PRESSURE SUPPORT)
PREFILLED_SYRINGE | INTRAVENOUS | Status: DC | PRN
  Administered 2019-05-13: 80 ug via INTRAVENOUS

## 2019-05-13 MED ORDER — CHLORHEXIDINE GLUCONATE 4 % EX LIQD: 60.0000 mL | Freq: Once | CUTANEOUS | Status: DC

## 2019-05-13 MED ORDER — OXYCODONE HCL 5 MG/5ML PO SOLN: 5.0000 mg | Freq: Once | ORAL | Status: DC | PRN

## 2019-05-13 MED ORDER — KETOROLAC TROMETHAMINE 30 MG/ML IJ SOLN
INTRAMUSCULAR | Status: DC | PRN
  Administered 2019-05-13: 30 mg

## 2019-05-13 MED ORDER — PHENYLEPHRINE 40 MCG/ML (10ML) SYRINGE FOR IV PUSH (FOR BLOOD PRESSURE SUPPORT)
PREFILLED_SYRINGE | INTRAVENOUS | Status: AC
  Filled 2019-05-13: qty 10

## 2019-05-13 MED ORDER — HYDROMORPHONE HCL 1 MG/ML IJ SOLN
0.5000 mg | INTRAMUSCULAR | Status: DC | PRN
  Administered 2019-05-13: 0.5 mg via INTRAVENOUS
  Administered 2019-05-14 (×3): 1 mg via INTRAVENOUS
  Filled 2019-05-13 (×5): qty 1

## 2019-05-13 MED ORDER — BISACODYL 10 MG RE SUPP: 10.0000 mg | Freq: Every day | RECTAL | Status: DC | PRN

## 2019-05-13 MED ORDER — TRANEXAMIC ACID-NACL 1000-0.7 MG/100ML-% IV SOLN
1000.0000 mg | INTRAVENOUS | Status: AC
  Administered 2019-05-13: 16:00:00 1000 mg via INTRAVENOUS
  Filled 2019-05-13: qty 100

## 2019-05-13 MED ORDER — MENTHOL 3 MG MT LOZG: 1.0000 | LOZENGE | OROMUCOSAL | Status: DC | PRN

## 2019-05-13 MED ORDER — MIDAZOLAM HCL 2 MG/2ML IJ SOLN
1.0000 mg | INTRAMUSCULAR | Status: DC
  Administered 2019-05-13: 2 mg via INTRAVENOUS
  Filled 2019-05-13: qty 2

## 2019-05-13 MED ORDER — EPHEDRINE SULFATE-NACL 50-0.9 MG/10ML-% IV SOSY
PREFILLED_SYRINGE | INTRAVENOUS | Status: DC | PRN
  Administered 2019-05-13: 10 mg via INTRAVENOUS
  Administered 2019-05-13: 5 mg via INTRAVENOUS

## 2019-05-13 MED ORDER — DEXAMETHASONE SODIUM PHOSPHATE 10 MG/ML IJ SOLN
10.0000 mg | Freq: Once | INTRAMUSCULAR | Status: AC
  Administered 2019-05-14: 10 mg via INTRAVENOUS
  Filled 2019-05-13: qty 1

## 2019-05-13 MED ORDER — BUPIVACAINE-EPINEPHRINE (PF) 0.25% -1:200000 IJ SOLN
INTRAMUSCULAR | Status: AC
  Filled 2019-05-13: qty 30

## 2019-05-13 MED ORDER — PAROXETINE HCL 20 MG PO TABS
20.0000 mg | ORAL_TABLET | Freq: Every day | ORAL | Status: DC
  Administered 2019-05-14: 20 mg via ORAL
  Filled 2019-05-13: qty 1

## 2019-05-13 MED ORDER — PROPOFOL 500 MG/50ML IV EMUL
INTRAVENOUS | Status: DC | PRN
  Administered 2019-05-13: 75 ug/kg/min via INTRAVENOUS

## 2019-05-13 MED ORDER — FENTANYL CITRATE (PF) 100 MCG/2ML IJ SOLN
50.0000 ug | INTRAMUSCULAR | Status: DC
  Administered 2019-05-13: 100 ug via INTRAVENOUS
  Filled 2019-05-13: qty 2

## 2019-05-13 MED ORDER — DEXAMETHASONE SODIUM PHOSPHATE 10 MG/ML IJ SOLN
INTRAMUSCULAR | Status: AC
  Filled 2019-05-13: qty 1

## 2019-05-13 MED ORDER — GABAPENTIN 100 MG PO CAPS: 100.0000 mg | ORAL_CAPSULE | Freq: Every day | ORAL | Status: DC | PRN

## 2019-05-13 SURGICAL SUPPLY — 57 items
ATTUNE MED ANAT PAT 35 KNEE (Knees) ×1 IMPLANT
ATTUNE PSFEM RTSZ4 NARCEM KNEE (Femur) ×1 IMPLANT
ATTUNE PSRP INSR SZ4 5 KNEE (Insert) ×1 IMPLANT
BAG ZIPLOCK 12X15 (MISCELLANEOUS) IMPLANT
BANDAGE ACE 6X5 VEL STRL LF (GAUZE/BANDAGES/DRESSINGS) ×2 IMPLANT
BASEPLATE TIBIAL ROTATING SZ 4 (Knees) ×1 IMPLANT
BLADE SAW SGTL 11.0X1.19X90.0M (BLADE) ×1 IMPLANT
BLADE SAW SGTL 13.0X1.19X90.0M (BLADE) ×2 IMPLANT
BLADE SURG SZ10 CARB STEEL (BLADE) ×4 IMPLANT
BOWL SMART MIX CTS (DISPOSABLE) ×2 IMPLANT
CEMENT HV SMART SET (Cement) ×2 IMPLANT
COVER SURGICAL LIGHT HANDLE (MISCELLANEOUS) ×2 IMPLANT
COVER WAND RF STERILE (DRAPES) IMPLANT
CUFF TOURN SGL QUICK 34 (TOURNIQUET CUFF) ×1
CUFF TRNQT CYL 34X4.125X (TOURNIQUET CUFF) ×1 IMPLANT
DECANTER SPIKE VIAL GLASS SM (MISCELLANEOUS) ×4 IMPLANT
DERMABOND ADVANCED (GAUZE/BANDAGES/DRESSINGS) ×1
DERMABOND ADVANCED .7 DNX12 (GAUZE/BANDAGES/DRESSINGS) ×1 IMPLANT
DRAPE U-SHAPE 47X51 STRL (DRAPES) ×2 IMPLANT
DRESSING AQUACEL AG SP 3.5X10 (GAUZE/BANDAGES/DRESSINGS) ×1 IMPLANT
DRSG AQUACEL AG SP 3.5X10 (GAUZE/BANDAGES/DRESSINGS) ×2
DURAPREP 26ML APPLICATOR (WOUND CARE) ×4 IMPLANT
ELECT REM PT RETURN 15FT ADLT (MISCELLANEOUS) ×2 IMPLANT
GLOVE BIO SURGEON STRL SZ 6 (GLOVE) ×2 IMPLANT
GLOVE BIOGEL PI IND STRL 6.5 (GLOVE) ×1 IMPLANT
GLOVE BIOGEL PI IND STRL 7.5 (GLOVE) ×1 IMPLANT
GLOVE BIOGEL PI IND STRL 8.5 (GLOVE) ×1 IMPLANT
GLOVE BIOGEL PI INDICATOR 6.5 (GLOVE) ×1
GLOVE BIOGEL PI INDICATOR 7.5 (GLOVE) ×1
GLOVE BIOGEL PI INDICATOR 8.5 (GLOVE) ×1
GLOVE ECLIPSE 8.0 STRL XLNG CF (GLOVE) ×2 IMPLANT
GLOVE ORTHO TXT STRL SZ7.5 (GLOVE) ×2 IMPLANT
GOWN STRL REUS W/ TWL LRG LVL3 (GOWN DISPOSABLE) ×1 IMPLANT
GOWN STRL REUS W/TWL 2XL LVL3 (GOWN DISPOSABLE) ×2 IMPLANT
GOWN STRL REUS W/TWL LRG LVL3 (GOWN DISPOSABLE) ×3 IMPLANT
HANDPIECE INTERPULSE COAX TIP (DISPOSABLE) ×1
HOLDER FOLEY CATH W/STRAP (MISCELLANEOUS) IMPLANT
KIT TURNOVER KIT A (KITS) IMPLANT
MANIFOLD NEPTUNE II (INSTRUMENTS) ×2 IMPLANT
NDL SAFETY ECLIPSE 18X1.5 (NEEDLE) IMPLANT
NEEDLE HYPO 18GX1.5 SHARP (NEEDLE)
NS IRRIG 1000ML POUR BTL (IV SOLUTION) ×2 IMPLANT
PACK TOTAL KNEE CUSTOM (KITS) ×2 IMPLANT
PIN THREADED HEADED SIGMA (PIN) ×1 IMPLANT
PROTECTOR NERVE ULNAR (MISCELLANEOUS) ×2 IMPLANT
SET HNDPC FAN SPRY TIP SCT (DISPOSABLE) ×1 IMPLANT
SET PAD KNEE POSITIONER (MISCELLANEOUS) ×2 IMPLANT
SUT MNCRL AB 4-0 PS2 18 (SUTURE) ×2 IMPLANT
SUT STRATAFIX PDS+ 0 24IN (SUTURE) ×2 IMPLANT
SUT VIC AB 1 CT1 36 (SUTURE) ×3 IMPLANT
SUT VIC AB 2-0 CT1 27 (SUTURE) ×3
SUT VIC AB 2-0 CT1 TAPERPNT 27 (SUTURE) ×3 IMPLANT
SYR 3ML LL SCALE MARK (SYRINGE) ×2 IMPLANT
TRAY FOLEY MTR SLVR 16FR STAT (SET/KITS/TRAYS/PACK) ×2 IMPLANT
WATER STERILE IRR 1000ML POUR (IV SOLUTION) ×4 IMPLANT
WRAP KNEE MAXI GEL POST OP (GAUZE/BANDAGES/DRESSINGS) ×2 IMPLANT
YANKAUER SUCT BULB TIP 10FT TU (MISCELLANEOUS) ×2 IMPLANT

## 2019-05-13 NOTE — Progress Notes (Signed)
PT Cancellation Note  Patient Details Name: Debra Rhodes MRN: [id number] DOB: 05-08-1961   Cancelled Treatment:    Reason Eval/Treat Not Completed: Fatigue/lethargy limiting ability to participate - Pt sleeping soundly upon PT arrival to room, did not wake when PT tried to wake her. Pt also with spinal end time of 1707. PT to check back tomorrow am.  Julien Girt, PT Acute Rehabilitation Services Pager [phone]  Office [phone]    [place] 05/13/2019, 6:57 PM

## 2019-05-13 NOTE — Anesthesia Procedure Notes (Signed)
Anesthesia Regional Block: Adductor canal block   Pre-Anesthetic Checklist: ,, timeout performed, Correct Patient, Correct Site, Correct Laterality, Correct Procedure, Correct Position, site marked, Risks and benefits discussed,  Surgical consent,  Pre-op evaluation,  At surgeon's request and post-op pain management  Laterality: Right  Prep: chloraprep       Needles:  Injection technique: Single-shot  Needle Type: Stimiplex     Needle Length: 9cm  Needle Gauge: 21     Additional Needles:   Procedures:,,,, ultrasound used (permanent image in chart),,,,  Narrative:  Start time: 05/13/2019 2:23 PM End time: 05/13/2019 2:28 PM Injection made incrementally with aspirations every 5 mL.  Performed by: Personally  Anesthesiologist: Lynda Rainwater, MD

## 2019-05-13 NOTE — Anesthesia Procedure Notes (Signed)
Spinal  Patient location during procedure: OR Start time: 05/13/2019 3:13 PM End time: 05/13/2019 3:16 PM Reason for block: at surgeon's request Staffing Resident/CRNA: West Pugh, CRNA Performed: resident/CRNA  Preanesthetic Checklist Completed: patient identified, site marked, surgical consent, pre-op evaluation, timeout performed, IV checked, risks and benefits discussed and monitors and equipment checked Spinal Block Patient position: sitting Prep: DuraPrep Patient monitoring: heart rate, continuous pulse ox and blood pressure Approach: midline Location: L3-4 Injection technique: single-shot Needle Needle type: Pencan  Needle gauge: 24 G Needle length: 9 cm Assessment Sensory level: T6 Additional Notes Expiration of kit checked and confirmed. Patient tolerated procedure well,without complications with noted clear CSF. Loss of motor and sensory on exam post injection. Dr Sabra Heck present.

## 2019-05-13 NOTE — Anesthesia Procedure Notes (Signed)
Procedure Name: MAC Date/Time: 05/13/2019 3:08 PM Performed by: West Pugh, CRNA Pre-anesthesia Checklist: Patient identified, Emergency Drugs available, Suction available, Patient being monitored and Timeout performed Patient Re-evaluated:Patient Re-evaluated prior to induction Oxygen Delivery Method: Simple face mask Preoxygenation: Pre-oxygenation with 100% oxygen Induction Type: IV induction Placement Confirmation: positive ETCO2 Dental Injury: Teeth and Oropharynx as per pre-operative assessment

## 2019-05-13 NOTE — Transfer of Care (Signed)
Immediate Anesthesia Transfer of Care Note  Patient: Debra Rhodes  Procedure(s) Performed: TOTAL KNEE ARTHROPLASTY (Right Knee)  Patient Location: PACU  Anesthesia Type:Spinal and MAC combined with regional for post-op pain  Level of Consciousness: awake, alert , oriented and patient cooperative  Airway & Oxygen Therapy: Patient Spontanous Breathing and Patient connected to face mask oxygen  Post-op Assessment: Report given to RN and Post -op Vital signs reviewed and stable  Post vital signs: Reviewed and stable  Last Vitals:  Vitals Value Taken Time  BP 120/60 05/13/19 1704  Temp    Pulse 87 05/13/19 1706  Resp 14 05/13/19 1706  SpO2 100 % 05/13/19 1706  Vitals shown include unvalidated device data.  Last Pain:  Vitals:   05/13/19 1425  TempSrc:   PainSc: 0-No pain         Complications: No apparent anesthesia complications

## 2019-05-13 NOTE — Anesthesia Postprocedure Evaluation (Signed)
Anesthesia Post Note  Patient: Debra Rhodes  Procedure(s) Performed: TOTAL KNEE ARTHROPLASTY (Right Knee)     Patient location during evaluation: PACU Anesthesia Type: Spinal Level of consciousness: oriented and awake and alert Pain management: pain level controlled Vital Signs Assessment: post-procedure vital signs reviewed and stable Respiratory status: spontaneous breathing and respiratory function stable Cardiovascular status: blood pressure returned to baseline and stable Postop Assessment: no headache, no backache and no apparent nausea or vomiting Anesthetic complications: no    Last Vitals:  Vitals:   05/13/19 1754 05/13/19 1901  BP: (!) 144/73 109/68  Pulse: 78 73  Resp: 16 16  Temp: 36.6 C (!) 36.3 C  SpO2: 97% 100%    Last Pain:  Vitals:   05/13/19 1901  TempSrc: Oral  PainSc:                  Lynda Rainwater

## 2019-05-13 NOTE — Discharge Instructions (Signed)

## 2019-05-13 NOTE — Anesthesia Preprocedure Evaluation (Signed)
Anesthesia Evaluation  Patient identified by MRN, date of birth, ID band Patient awake    Reviewed: Allergy & Precautions, NPO status , Patient's Chart, lab work & pertinent test results  History of Anesthesia Complications (+) PONV  Airway Mallampati: II  TM Distance: >3 FB Neck ROM: Full    Dental no notable dental hx.    Pulmonary neg pulmonary ROS,    Pulmonary exam normal breath sounds clear to auscultation       Cardiovascular negative cardio ROS Normal cardiovascular exam Rhythm:Regular Rate:Normal     Neuro/Psych Anxiety negative neurological ROS  negative psych ROS   GI/Hepatic negative GI ROS, Neg liver ROS,   Endo/Other  negative endocrine ROS  Renal/GU negative Renal ROS  negative genitourinary   Musculoskeletal negative musculoskeletal ROS (+)   Abdominal   Peds negative pediatric ROS (+)  Hematology negative hematology ROS (+)   Anesthesia Other Findings   Reproductive/Obstetrics negative OB ROS                             Anesthesia Physical Anesthesia Plan  ASA: II  Anesthesia Plan: Spinal   Post-op Pain Management:  Regional for Post-op pain   Induction: Intravenous  PONV Risk Score and Plan: 3 and Ondansetron, Propofol infusion, Midazolam and Treatment may vary due to age or medical condition  Airway Management Planned: Simple Face Mask  Additional Equipment:   Intra-op Plan:   Post-operative Plan:   Informed Consent: I have reviewed the patients History and Physical, chart, labs and discussed the procedure including the risks, benefits and alternatives for the proposed anesthesia with the patient or authorized representative who has indicated his/her understanding and acceptance.     Dental advisory given  Plan Discussed with: CRNA  Anesthesia Plan Comments:         Anesthesia Quick Evaluation

## 2019-05-13 NOTE — Op Note (Signed)
NAME:  Debra Rhodes RECORD NO.:  [id number]                             FACILITY:  [facility]      PHYSICIAN:  Pietro Cassis. Alvan Dame, M.D.  DATE OF BIRTH:  1960/12/20      DATE OF PROCEDURE:  05/13/2019                                     OPERATIVE REPORT         PREOPERATIVE DIAGNOSIS:  Right knee osteoarthritis.      POSTOPERATIVE DIAGNOSIS:  Right knee osteoarthritis.      FINDINGS:  The patient was noted to have complete loss of cartilage and   bone-on-bone arthritis with associated osteophytes in the medial and patellofemoral compartments of   the knee with a significant synovitis and associated effusion.  The patient had failed months of conservative treatment including medications, injection therapy, activity modification.     PROCEDURE:  Right total knee replacement.      COMPONENTS USED:  DePuy Attune rotating platform posterior stabilized knee   system, a size 4N femur, 4 tibia, size 5 mm PS AOX insert, and 35 anatomic patellar   button.      SURGEON:  Pietro Cassis. Alvan Dame, M.D.      ASSISTANT:  Griffith Citron, PA-C.      ANESTHESIA:  Regional and Spinal.      SPECIMENS:  None.      COMPLICATION:  None.      DRAINS:  None.  EBL: <100cc      TOURNIQUET TIME:  29 min at 250 mmHg   The patient was stable to the recovery room.      INDICATION FOR PROCEDURE:  Debra Rhodes is a 58 y.o. female patient of   mine.  The patient had been seen, evaluated, and treated for months conservatively in the   office with medication, activity modification, and injections.  The patient had   radiographic changes of bone-on-bone arthritis with endplate sclerosis and osteophytes noted.  Based on the radiographic changes and failed conservative measures, the patient   decided to proceed with definitive treatment, total knee replacement.  Risks of infection, DVT, component failure, need for revision surgery, neurovascular injury were reviewed in the office setting.  The  postop course was reviewed stressing the efforts to maximize post-operative satisfaction and function.  Consent was obtained for benefit of pain   relief.      PROCEDURE IN DETAIL:  The patient was brought to the operative theater.   Once adequate anesthesia, preoperative antibiotics, 2 gm of Ancef,1 gm of Tranexamic Acid, and 10 mg of Decadron administered, the patient was positioned supine with a right thigh tourniquet placed.  The  right lower extremity was prepped and draped in sterile fashion.  A time-   out was performed identifying the patient, planned procedure, and the appropriate extremity.      The right lower extremity was placed in the [facility] leg holder.  The leg was   exsanguinated, tourniquet elevated to 250 mmHg.  A midline incision was   made followed by median parapatellar arthrotomy.  Following initial   exposure, attention was first directed to  the patella.  Precut   measurement was noted to be 20 mm.  I resected down to 12-13 mm and used a   35 anatomic patellar button to restore patellar height as well as cover the cut surface.      The lug holes were drilled and a metal shim was placed to protect the   patella from retractors and saw blade during the procedure.      At this point, attention was now directed to the femur.  The femoral   canal was opened with a drill, irrigated to try to prevent fat emboli.  An   intramedullary rod was passed at 3 degrees valgus, 9 mm of bone was   resected off the distal femur.  Following this resection, the tibia was   subluxated anteriorly.  Using the extramedullary guide, 2 mm of bone was resected off   the proximal medial tibia.  We confirmed the gap would be   stable medially and laterally with a size 5 spacer block as well as confirmed that the tibial cut was perpendicular in the coronal plane, checking with an alignment rod.      Once this was done, I sized the femur to be a size 4 in the anterior-   posterior dimension, chose  a narrow component based on medial and   lateral dimension.  The size 4 rotation block was then pinned in   position anterior referenced using the C-clamp to set rotation.  The   anterior, posterior, and  chamfer cuts were made without difficulty nor   notching making certain that I was along the anterior cortex to help   with flexion gap stability.      The final box cut was made off the lateral aspect of distal femur.      At this point, the tibia was sized to be a size 4.  The size 4 tray was   then pinned in position through the medial third of the tubercle,   drilled, and keel punched.  Trial reduction was now carried with a 4 femur,  4 tibia, a size 5 mm PS insert, and the 35 anatomic patella botton.  The knee was brought to full extension with good flexion stability with the patella   tracking through the trochlea without application of pressure.  Given   all these findings the trial components removed.  Final components were   opened and cement was mixed.  The knee was irrigated with normal saline solution and pulse lavage.  The synovial lining was   then injected with 30 cc of 0.25% Marcaine with epinephrine, 1 cc of Toradol and 30 cc of NS for a total of 61 cc.     Final implants were then cemented onto cleaned and dried cut surfaces of bone with the knee brought to extension with a size 5 mm PS trial insert.      Once the cement had fully cured, excess cement was removed   throughout the knee.  I confirmed that I was satisfied with the range of   motion and stability, and the final size 5 mm PS AOX insert was chosen.  It was   placed into the knee.      The tourniquet had been let down at 29 minutes.  No significant   hemostasis was required.  The extensor mechanism was then reapproximated using #1 Vicryl and #1 Stratafix sutures with the knee   in flexion.  The   remaining wound  was closed with 2-0 Vicryl and running 4-0 Monocryl.   The knee was cleaned, dried, dressed  sterilely using Dermabond and   Aquacel dressing.  The patient was then   brought to recovery room in stable condition, tolerating the procedure   well.   Please note that Physician Assistant, Griffith Citron, PA-C was present for the entirety of the case, and was utilized for pre-operative positioning, peri-operative retractor management, general facilitation of the procedure and for primary wound closure at the end of the case.              Pietro Cassis Alvan Dame, M.D.    05/13/2019 2:06 PM

## 2019-05-13 NOTE — Interval H&P Note (Signed)
History and Physical Interval Note:  05/13/2019 1:58 PM  Debra Rhodes  has presented today for surgery, with the diagnosis of Right knee osteoarthritis.  The various methods of treatment have been discussed with the patient and family. After consideration of risks, benefits and other options for treatment, the patient has consented to  Procedure(s) with comments: TOTAL KNEE ARTHROPLASTY (Right) - 70 mins as a surgical intervention.  The patient's history has been reviewed, patient examined, no change in status, stable for surgery.  I have reviewed the patient's chart and labs.  Questions were answered to the patient's satisfaction.     Mauri Pole

## 2019-05-13 NOTE — Progress Notes (Signed)
AssistedDr. Miller with right, ultrasound guided, adductor canal block. Side rails up, monitors on throughout procedure. See vital signs in flow sheet. Tolerated Procedure well.  

## 2019-05-14 ENCOUNTER — Encounter (HOSPITAL_COMMUNITY): Payer: Self-pay | Admitting: Orthopedic Surgery

## 2019-05-14 DIAGNOSIS — M1711 Unilateral primary osteoarthritis, right knee: Secondary | ICD-10-CM | POA: Diagnosis not present

## 2019-05-14 DIAGNOSIS — E663 Overweight: Secondary | ICD-10-CM

## 2019-05-14 HISTORY — DX: Overweight: E66.3

## 2019-05-14 LAB — CBC
HCT: 36 % (ref 36.0–46.0)
Hemoglobin: 11.4 g/dL — ABNORMAL LOW (ref 12.0–15.0)
MCH: 30 pg (ref 26.0–34.0)
MCHC: 31.7 g/dL (ref 30.0–36.0)
MCV: 94.7 fL (ref 80.0–100.0)
Platelets: 203 10*3/uL (ref 150–400)
RBC: 3.8 MIL/uL — ABNORMAL LOW (ref 3.87–5.11)
RDW: 12.4 % (ref 11.5–15.5)
WBC: 7.6 10*3/uL (ref 4.0–10.5)
nRBC: 0 % (ref 0.0–0.2)

## 2019-05-14 LAB — BASIC METABOLIC PANEL
Anion gap: 7 (ref 5–15)
BUN: 8 mg/dL (ref 6–20)
CO2: 28 mmol/L (ref 22–32)
Calcium: 8.9 mg/dL (ref 8.9–10.3)
Chloride: 105 mmol/L (ref 98–111)
Creatinine, Ser: 0.63 mg/dL (ref 0.44–1.00)
GFR calc Af Amer: >60 mL/min (ref >60)
GFR calc non Af Amer: >60 mL/min (ref >60)
Glucose, Bld: 144 mg/dL — ABNORMAL HIGH (ref 70–99)
Potassium: 4.9 mmol/L (ref 3.5–5.1)
Sodium: 140 mmol/L (ref 135–145)

## 2019-05-14 MED ORDER — FERROUS SULFATE 325 (65 FE) MG PO TABS: 325.0000 mg | ORAL_TABLET | Freq: Three times a day (TID) | ORAL | 0 refills | Status: DC

## 2019-05-14 MED ORDER — ASPIRIN 81 MG PO CHEW: 81.0000 mg | CHEWABLE_TABLET | Freq: Two times a day (BID) | ORAL | 0 refills | Status: AC

## 2019-05-14 MED ORDER — POLYETHYLENE GLYCOL 3350 17 G PO PACK: 17.0000 g | PACK | Freq: Two times a day (BID) | ORAL | 0 refills | Status: DC

## 2019-05-14 MED ORDER — DOCUSATE SODIUM 100 MG PO CAPS: 100.0000 mg | ORAL_CAPSULE | Freq: Two times a day (BID) | ORAL | 0 refills | Status: DC

## 2019-05-14 MED ORDER — METHOCARBAMOL 500 MG PO TABS: 500.0000 mg | ORAL_TABLET | Freq: Four times a day (QID) | ORAL | 0 refills | Status: DC | PRN

## 2019-05-14 MED ORDER — HYDROCODONE-ACETAMINOPHEN 7.5-325 MG PO TABS: 1.0000 | ORAL_TABLET | ORAL | 0 refills | Status: DC | PRN

## 2019-05-14 MED FILL — METHOCARBAMOL 500 MG TABS: 500 | 10 days supply | Qty: 40 | Fill #0

## 2019-05-14 MED FILL — HYDROCODON-APAP 7.5-325: 7.5-325 | 5 days supply | Qty: 60 | Fill #0

## 2019-05-14 MED FILL — ASPIRIN LOW DOSE 81 MG CHEW: 81 | 30 days supply | Qty: 60 | Fill #0

## 2019-05-14 NOTE — Progress Notes (Signed)
Physical Therapy Treatment Patient Details Name: Debra Rhodes MRN: [id number] DOB: Jan 14, 1961 Today's Date: 05/14/2019    History of Present Illness Pt s/p R TKR    PT Comments    Pt progressing well with mobility and eager for dc home.  Pt reviewed single step into apartment and home therex with written instruction provided.   Follow Up Recommendations  Follow surgeon's recommendation for DC plan and follow-up therapies     Equipment Recommendations  None recommended by PT    Recommendations for Other Services       Precautions / Restrictions Precautions Precautions: Knee;Fall Restrictions Weight Bearing Restrictions: No    Mobility  Bed Mobility Overal bed mobility: Needs Assistance Bed Mobility: Supine to Sit;Sit to Supine     Supine to sit: Supervision Sit to supine: Supervision   General bed mobility comments: min guard R LE  Transfers Overall transfer level: Needs assistance Equipment used: Rolling walker (2 wheeled) Transfers: Sit to/from Stand Sit to Stand: Supervision         General transfer comment: cues for use of UEs to self assist  Ambulation/Gait Ambulation/Gait assistance: Min guard;Supervision Gait Distance (Feet): 100 Feet Assistive device: Rolling walker (2 wheeled) Gait Pattern/deviations: Step-to pattern;Decreased step length - right;Decreased step length - left;Shuffle;Trunk flexed Gait velocity: decr   General Gait Details: cues for sequence, posture and position from RW   Stairs Stairs: Yes Stairs assistance: Min guard Stair Management: No rails;Step to pattern;Forwards;Backwards;With walker Number of Stairs: 3 General stair comments: single step twice fwd and once bkwd; cues for sequence and foot/RW placement   Wheelchair Mobility    Modified Rankin (Stroke Patients Only)       Balance Overall balance assessment: Mild deficits observed, not formally tested                                           Cognition Arousal/Alertness: Awake/alert Behavior During Therapy: WFL for tasks assessed/performed Overall Cognitive Status: Within Functional Limits for tasks assessed                                        Exercises Total Joint Exercises Ankle Circles/Pumps: AROM;Both;15 reps;Supine Quad Sets: AROM;Both;10 reps;Supine Heel Slides: AAROM;Right;15 reps;Supine Straight Leg Raises: AAROM;AROM;Right;10 reps;Supine    General Comments        Pertinent Vitals/Pain Pain Assessment: 0-10 Pain Score: 5  Pain Location: R knee Pain Descriptors / Indicators: Aching;Sore Pain Intervention(s): Limited activity within patient's tolerance;Monitored during session;Premedicated before session;Ice applied    Home Living Family/patient expects to be discharged to:: Private residence Living Arrangements: Spouse/significant other Available Help at Discharge: Family Type of Home: House Home Access: Stairs to enter   Home Layout: One level Home Equipment: Environmental consultant - 2 wheels;Cane - single point;Cane - quad      Prior Function Level of Independence: Independent          PT Goals (current goals can now be found in the care plan section) Acute Rehab PT Goals Patient Stated Goal: walk without pain PT Goal Formulation: With patient Time For Goal Achievement: 05/21/19 Potential to Achieve Goals: Good Progress towards PT goals: Progressing toward goals    Frequency    7X/week      PT Plan Current plan remains appropriate    Co-evaluation  AM-PAC PT "6 Clicks" Mobility   Outcome Measure  Help needed turning from your back to your side while in a flat bed without using bedrails?: A Little Help needed moving from lying on your back to sitting on the side of a flat bed without using bedrails?: A Little Help needed moving to and from a bed to a chair (including a wheelchair)?: A Little Help needed standing up from a chair using your arms (e.g.,  wheelchair or bedside chair)?: A Little Help needed to walk in hospital room?: A Little Help needed climbing 3-5 steps with a railing? : A Little 6 Click Score: 18    End of Session Equipment Utilized During Treatment: Gait belt Activity Tolerance: Patient tolerated treatment well Patient left: with call bell/phone within reach;in bed Nurse Communication: Mobility status PT Visit Diagnosis: Difficulty in walking, not elsewhere classified (R26.2)     Time: 6606-0045 PT Time Calculation (min) (ACUTE ONLY): 34 min  Charges:  $Gait Training: 8-22 mins $Therapeutic Exercise: 8-22 mins                     [facility] Pager [phone] Office [phone]    Keeva Reisen 05/14/2019, 12:52 PM

## 2019-05-14 NOTE — Evaluation (Signed)
Physical Therapy Evaluation Patient Details Name: Debra Rhodes MRN: [id number] DOB: 1961-10-16 Today's Date: 05/14/2019   History of Present Illness  Pt s/p R TKR  Clinical Impression  Pt s/p R TKR and presents with decreased R LE strength/ROM and post op pain limiting functional mobility.  Pt should progress well to dc home with family assist and reports first OP appt Friday 05/16/19.    Follow Up Recommendations Follow surgeon's recommendation for DC plan and follow-up therapies    Equipment Recommendations  None recommended by PT    Recommendations for Other Services       Precautions / Restrictions Precautions Precautions: Knee;Fall Restrictions Weight Bearing Restrictions: No      Mobility  Bed Mobility Overal bed mobility: Needs Assistance Bed Mobility: Supine to Sit     Supine to sit: Min guard     General bed mobility comments: min guard R LE  Transfers Overall transfer level: Needs assistance Equipment used: Rolling walker (2 wheeled) Transfers: Sit to/from Stand Sit to Stand: Min guard         General transfer comment: steady assist with cues for LE management and use of UEs to self assist  Ambulation/Gait Ambulation/Gait assistance: Min guard Gait Distance (Feet): 100 Feet(and 15' back from bathroom) Assistive device: Rolling walker (2 wheeled) Gait Pattern/deviations: Step-to pattern;Decreased step length - right;Decreased step length - left;Shuffle;Trunk flexed Gait velocity: decr   General Gait Details: cues for sequence, posture and position from [employer]            Wheelchair Mobility    Modified Rankin (Stroke Patients Only)       Balance Overall balance assessment: Mild deficits observed, not formally tested                                           Pertinent Vitals/Pain Pain Assessment: 0-10 Pain Score: 6  Pain Location: R knee Pain Descriptors / Indicators: Aching;Sore Pain Intervention(s):  Limited activity within patient's tolerance;Monitored during session;Premedicated before session;Ice applied    Home Living Family/patient expects to be discharged to:: Private residence Living Arrangements: Spouse/significant other Available Help at Discharge: Family Type of Home: House Home Access: Stairs to enter   Technical brewer of Steps: 1 Home Layout: One level Home Equipment: Environmental consultant - 2 wheels;Cane - single point;Cane - quad      Prior Function Level of Independence: Independent               Hand Dominance        Extremity/Trunk Assessment   Upper Extremity Assessment Upper Extremity Assessment: Overall WFL for tasks assessed    Lower Extremity Assessment Lower Extremity Assessment: RLE deficits/detail RLE Deficits / Details: 3/5 quads with IND SLR, AAROM at knee -5 - 75       Communication   Communication: No difficulties  Cognition Arousal/Alertness: Awake/alert Behavior During Therapy: WFL for tasks assessed/performed Overall Cognitive Status: Within Functional Limits for tasks assessed                                        General Comments      Exercises Total Joint Exercises Ankle Circles/Pumps: AROM;Both;15 reps;Supine Quad Sets: AROM;Both;10 reps;Supine Heel Slides: AAROM;Right;15 reps;Supine Straight Leg Raises: AAROM;AROM;Right;10 reps;Supine   Assessment/Plan    PT Assessment  Patient needs continued PT services  PT Problem List Decreased strength;Decreased range of motion;Decreased activity tolerance;Decreased mobility;Pain;Decreased knowledge of use of DME       PT Treatment Interventions DME instruction;Gait training;Stair training;Functional mobility training;Therapeutic activities;Therapeutic exercise;Patient/family education    PT Goals (Current goals can be found in the Care Plan section)  Acute Rehab PT Goals Patient Stated Goal: walk without pain PT Goal Formulation: With patient Time For Goal  Achievement: 05/21/19 Potential to Achieve Goals: Good    Frequency 7X/week   Barriers to discharge        Co-evaluation               AM-PAC PT "6 Clicks" Mobility  Outcome Measure Help needed turning from your back to your side while in a flat bed without using bedrails?: A Little Help needed moving from lying on your back to sitting on the side of a flat bed without using bedrails?: A Little Help needed moving to and from a bed to a chair (including a wheelchair)?: A Little Help needed standing up from a chair using your arms (e.g., wheelchair or bedside chair)?: A Little Help needed to walk in hospital room?: A Little Help needed climbing 3-5 steps with a railing? : A Little 6 Click Score: 18    End of Session Equipment Utilized During Treatment: Gait belt Activity Tolerance: Patient tolerated treatment well Patient left: in chair;with call bell/phone within reach Nurse Communication: Mobility status PT Visit Diagnosis: Difficulty in walking, not elsewhere classified (R26.2)    Time: 0223-3612 PT Time Calculation (min) (ACUTE ONLY): 33 min   Charges:   PT Evaluation $PT Eval Low Complexity: 1 Low PT Treatments $Therapeutic Exercise: 8-22 mins        Debe Coder PT Acute Rehabilitation Services Pager [phone] Office [phone]   Jacquline Terrill 05/14/2019, 11:39 AM

## 2019-05-14 NOTE — Progress Notes (Signed)
Discharge Plan of Care: Outpatient physical therapy  Has Rolling Walker, Declines 3 in 1

## 2019-05-14 NOTE — Progress Notes (Signed)
     Subjective: 1 Day Post-Op Procedure(s) (LRB): TOTAL KNEE ARTHROPLASTY (Right)   Patient reports pain as mild, controlled with with medications.  No reported events throughout the night.  Dr. Alvan Dame discussed the procedure and expectations moving forward.  Ready to be discharged home if they do well with PT.   Patient's anticipated LOS is less than 2 midnights, meeting these requirements: - Younger than 60 - Lives within 1 hour of care - Has a competent adult at home to recover with post-op recover - NO history of  - Chronic pain requiring opiods  - Diabetes  - Coronary Artery Disease  - Heart failure  - Heart attack  - Stroke  - DVT/VTE  - Cardiac arrhythmia  - Respiratory Failure/COPD  - Renal failure  - Anemia  - Advanced Liver disease       Objective:   VITALS:   Vitals:   05/14/19 0140 05/14/19 0442  BP: 105/73 102/90  Pulse: 61 67  Resp: 16 16  Temp: 97.9 F (36.6 C) 97.8 F (36.6 C)  SpO2: 99% 100%    Dorsiflexion/Plantar flexion intact Incision: dressing C/D/I No cellulitis present Compartment soft  LABS Recent Labs    05/14/19 0317  HGB 11.4*  HCT 36.0  WBC 7.6  PLT 203    Recent Labs    05/14/19 0317  NA 140  K 4.9  BUN 8  CREATININE 0.63  GLUCOSE 144*     Assessment/Plan: 1 Day Post-Op Procedure(s) (LRB): TOTAL KNEE ARTHROPLASTY (Right) Foley cath d/c'ed Advance diet Up with therapy D/C IV fluids Discharge home Follow up in 2 weeks at [facility] ([place]). Follow up with OLIN,Cutler Sunday D in 2 weeks.  Contact information:  [facility]) [address], [place] [phone]    Overweight (BMI 25-29.9) Estimated body mass index is 25.93 kg/m as calculated from the following:   Height as of this encounter: 5' 0.5" (1.537 m).   Weight as of this encounter: 61.2 kg. Patient also counseled that weight may inhibit the healing process  Patient counseled that losing weight will help with future health issues       West Pugh. Liliya Fullenwider   PAC  05/14/2019, 8:27 AM

## 2019-05-15 NOTE — Discharge Summary (Signed)
Physician Discharge Summary  Patient ID: Debra Rhodes MRN: [id number] DOB/AGE: Mar 03, 1961 58 y.o.  Admit date: 05/13/2019 Discharge date: 05/14/2019   Procedures:  Procedure(s) (LRB): TOTAL KNEE ARTHROPLASTY (Right)  Attending Physician:  Dr. Paralee Cancel   Admission Diagnoses:   Right knee primary OA / pain  Discharge Diagnoses:  Principal Problem:   S/P right TKA Active Problems:   Overweight (BMI 25.0-29.9)  Past Medical History:  Diagnosis Date  . Anxiety   . PONV (postoperative nausea and vomiting)     HPI:    Debra Rhodes, 58 y.o. female, has a history of pain and functional disability in the right knee due to arthritis and has failed non-surgical conservative treatments for greater than 12 weeks to includeNSAID's and/or analgesics, corticosteriod injections and activity modification.  Onset of symptoms was gradual, starting >10 years ago with gradually worsening course since that time. The patient noted no past surgery on the right knee(s).  Patient currently rates pain in the right knee(s) at 6 out of 10 with activity. Patient has night pain, worsening of pain with activity and weight bearing, pain that interferes with activities of daily living and pain with passive range of motion.  Patient has evidence of periarticular osteophytes and joint space narrowing by imaging studies.  There is no active infection. Risks, benefits and expectations were discussed with the patient.  Risks including but not limited to the risk of anesthesia, blood clots, nerve damage, blood vessel damage, failure of the prosthesis, infection and up to and including death.  Patient understand the risks, benefits and expectations and wishes to proceed with surgery.   PCP: Maurice Small, MD   Discharged Condition: good  Hospital Course:  Patient underwent the above stated procedure on 05/13/2019. Patient tolerated the procedure well and brought to the recovery room in good condition and subsequently to  the floor.  POD #1 BP: 102/90 ; Pulse: 67 ; Temp: 97.8 F (36.6 C) ; Resp: 16 Patient reports pain asmild, controlled with with medications.No reported events throughout the night. Dr. Alvan Dame discussed the procedure and expectations moving forward. Ready to be discharged home. Dorsiflexion/plantar flexion intact, incision: dressing C/D/I, no cellulitis present and compartment soft.   LABS  Basename    HGB     11.4  HCT     36.0    Discharge Exam: General appearance: alert, cooperative and no distress Extremities: Homans sign is negative, no sign of DVT, no edema, redness or tenderness in the calves or thighs and no ulcers, gangrene or trophic changes  Disposition:  Home with follow up in 2 weeks   Follow-up Information    Paralee Cancel, MD. Schedule an appointment as soon as possible for a visit in 2 weeks.   Specialty: Orthopedic Surgery Contact information: [address] [place] [address] [phone]           Discharge Instructions    Call MD / Call 911   Complete by: As directed    If you experience chest pain or shortness of breath, CALL 911 and be transported to the hospital emergency room.  If you develope a fever above 101 F, pus (white drainage) or increased drainage or redness at the wound, or calf pain, call your surgeon's office.   Change dressing   Complete by: As directed    Maintain surgical dressing until follow up in the clinic. If the edges start to pull up, may reinforce with tape. If the dressing is no longer  working, may remove and cover with gauze and tape, but must keep the area dry and clean.  Call with any questions or concerns.   Constipation Prevention   Complete by: As directed    Drink plenty of fluids.  Prune juice may be helpful.  You may use a stool softener, such as Colace (over the counter) 100 mg twice a day.  Use MiraLax (over the counter) for constipation as needed.   Diet - low sodium heart healthy   Complete by:  As directed    Discharge instructions   Complete by: As directed    Maintain surgical dressing until follow up in the clinic. If the edges start to pull up, may reinforce with tape. If the dressing is no longer working, may remove and cover with gauze and tape, but must keep the area dry and clean.  Follow up in 2 weeks at [facility]. Call with any questions or concerns.   Increase activity slowly as tolerated   Complete by: As directed    Weight bearing as tolerated with assist device (walker, cane, etc) as directed, use it as long as suggested by your surgeon or therapist, typically at least 4-6 weeks.   TED hose   Complete by: As directed    Use stockings (TED hose) for 2 weeks on both leg(s).  You may remove them at night for sleeping.      Allergies as of 05/14/2019      Reactions   Latex Shortness Of Breath, Swelling, Rash      Medication List    STOP taking these medications   azithromycin 250 MG tablet Commonly known as: ZITHROMAX   guaiFENesin-codeine 100-10 MG/5ML syrup   predniSONE 50 MG tablet Commonly known as: DELTASONE   tobramycin-dexamethasone ophthalmic solution Commonly known as: TobraDex     TAKE these medications   aspirin 81 MG chewable tablet Commonly known as: Aspirin Childrens Chew 1 tablet (81 mg total) by mouth 2 (two) times daily for 30 days. Take for 4 weeks, then resume regular dose.   docusate sodium 100 MG capsule Commonly known as: Colace Take 1 capsule (100 mg total) by mouth 2 (two) times daily.   ferrous sulfate 325 (65 FE) MG tablet Commonly known as: FerrouSul Take 1 tablet (325 mg total) by mouth 3 (three) times daily with meals for 14 days.   fexofenadine 180 MG tablet Commonly known as: ALLEGRA Take 180 mg by mouth daily.   gabapentin 100 MG capsule Commonly known as: NEURONTIN Take 100 mg by mouth daily as needed (pain).   HAIR/SKIN/NAILS PO Take 1 tablet by mouth daily.   HYDROcodone-acetaminophen 7.5-325  MG tablet Commonly known as: Norco Take 1-2 tablets by mouth every 4 (four) hours as needed for moderate pain.   Magnesium 500 MG Tabs Take 500 mg by mouth daily.   methocarbamol 500 MG tablet Commonly known as: Robaxin Take 1 tablet (500 mg total) by mouth every 6 (six) hours as needed for muscle spasms.   PARoxetine 20 MG tablet Commonly known as: PAXIL Take 20 mg by mouth daily.   polyethylene glycol 17 g packet Commonly known as: MIRALAX / GLYCOLAX Take 17 g by mouth 2 (two) times daily.   traZODone 50 MG tablet Commonly known as: DESYREL Take 50-75 mg by mouth at bedtime.            Discharge Care Instructions  (From admission, onward)         Start  Ordered   05/14/19 0000  Change dressing    Comments: Maintain surgical dressing until follow up in the clinic. If the edges start to pull up, may reinforce with tape. If the dressing is no longer working, may remove and cover with gauze and tape, but must keep the area dry and clean.  Call with any questions or concerns.   05/14/19 0831           Signed: West Pugh. Jonathyn Carothers   PA-C  05/15/2019, 8:31 AM

## 2019-05-16 ENCOUNTER — Encounter: Payer: Self-pay | Admitting: *Deleted

## 2019-05-16 ENCOUNTER — Other Ambulatory Visit: Payer: Self-pay | Admitting: *Deleted

## 2019-05-16 NOTE — Patient Outreach (Signed)
[facility]) Care Management  05/16/2019  NIVEDITA MIRABELLA 1961-01-31 [id number]  Transition of care telephone call  Referral received: 05/09/19 Initial outreach: 05/16/19 Insurance: [place]  Initial unsuccessful telephone call to patient's preferred number in order to complete transition of care assessment; no answer, left HIPAA compliant voicemail message requesting return call.   Objective: Per the electronic medical record, Talin Rozeboom was hospitalized at [facility] from 6/23-6/24/20 for right total knee replacement. Comorbidities include: anxiety, overweight She was discharged to home on 05/14/19 without the need for home health services or durable medical equipment per the discharge summary. She was given a home exercise plan by hospital physical therapist.   Plan: This RNCM will route unsuccessful outreach letter with [place] Management pamphlet and 24 hour Nurse Advice Line Magnet to [place] [employer] clinical pool to be mailed to patient's home address. This RNCM will attempt another outreach within 4 business days.  Barrington Ellison RN,CCM,CDE [place] Management Coordinator Office Phone [phone] Office Fax [phone]

## 2019-05-21 ENCOUNTER — Other Ambulatory Visit: Payer: Self-pay | Admitting: *Deleted

## 2019-05-21 ENCOUNTER — Encounter: Payer: Self-pay | Admitting: *Deleted

## 2019-05-21 MED FILL — HYDROCODON-APAP 7.5-325: 7.5-325 | 7 days supply | Qty: 60 | Fill #0

## 2019-05-21 NOTE — Patient Outreach (Signed)
Debra [facility]) Care Management  05/21/2019  Debra Rhodes September 06, 1961 [id number]  Transition of care call/case closure   Referral received: 05/09/19 Initial outreach: 05/16/19 Insurance: [place] Focus Plan   Subjective: Successful telephone call to patient's home/mobile number in order to complete transition of care assessment; 2 HIPAA identifiers verified. Explained purpose of call and completed transition of care assessment.  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 She says she is currently treating her surgical pain with Ibuprofen and robaxin. She says she stopped taking the low dose aspirin because a nurse at the surgeon's office told her to stop the aspirin since she was taking ibuprofen for pain. She also says she is not wearing her TEDs. She says the surgical dressing is intact without drainage. She says she was severely constipated but was able to resolve the problem with colace, miralax and prune juice. She says she is not taking the iron tablets because they are contributing to her constipation  Her boyfriend is assisting with her recovery.  She says she purchased the hospital indemnity insurance but is having difficulty securing a document with a time stamp of her length of stay in the hospital that [name] is requesting..  She states she is an active participant of [name]. She says she would like the criteria to meet the [name] e-mailed to her personal e-mail address.   Objective:  Debra Rhodes was hospitalized at [name] from 6/23-6/24/20 for right total knee replacement. Comorbidities include: anxiety, overweight She was discharged  to home on 05/14/19 without the need for home health services or durable medical equipment per the discharge summary. She was given a home exercise plan by hospital physical therapist and is participating in outpatient physical therapy.   Assessment:  Patient has questions about resuming aspiring therapy and duration of TEDs therapy, otherwise voices good understanding of all discharge instructions.  See transition of care flowsheet for assessment details.   Plan:  Discussed the importance of adequate pain relief to promote recovery and post operative antithrombolytic therapy. Advised her to call her surgeon's office today to ask about the refill for her narcotic analgesic and if she should resume the low dose aspirin. Instructed her to wear her TEDs for 2 weeks post discharge and that she can remove them at night. Since the iron tablet constipated her, reviewed foods high in iron to treat her mild anemia ( Hgb was 11.4 on day of discharge). Advised her to contact [name] to determine what document is needed to film her claim and contact this RNCM for assistance prn.    Reviewed hospital discharge diagnosis of right total knee arthroplasty and treatment plan using hospital discharge instructions, assessing medication adherence, reviewing postoperative problems requiring provider notification, and discussing the importance of follow up with surgeon as directed. E-mailed the [name] to qualify for the 2021 Healthy Lifestyle Premium to [name] personal e-mail address per her request. No ongoing care management needs identified so will close case to [place] Management services.  Barrington Ellison RN,CCM,CDE [place] Management Coordinator Office Phone [phone] Office Fax [phone]

## 2019-05-27 MED FILL — GABAPENTIN 300 MG CAPSULE: 300 | 30 days supply | Qty: 90 | Fill #0

## 2019-05-30 MED FILL — HYDROCODON-APAP 5-325: 5-325 | 8 days supply | Qty: 60 | Fill #0

## 2019-06-10 MED FILL — traMADol HCL 50 MG TABS: 50 | 10 days supply | Qty: 40 | Fill #2

## 2019-06-10 MED FILL — HYDROCODON-APAP 5-325: 5-325 | 8 days supply | Qty: 60 | Fill #0

## 2019-06-27 MED FILL — HYDROCODON-APAP 5-325: 5-325 | 20 days supply | Qty: 20 | Fill #0

## 2019-06-27 MED FILL — CELECOXIB 200 MG CAPSULE: 200 | 53 days supply | Qty: 60 | Fill #0

## 2019-06-30 DIAGNOSIS — J342 Deviated nasal septum: Secondary | ICD-10-CM | POA: Insufficient documentation

## 2019-06-30 DIAGNOSIS — H906 Mixed conductive and sensorineural hearing loss, bilateral: Secondary | ICD-10-CM | POA: Insufficient documentation

## 2019-06-30 DIAGNOSIS — H9313 Tinnitus, bilateral: Secondary | ICD-10-CM | POA: Insufficient documentation

## 2019-06-30 DIAGNOSIS — J310 Chronic rhinitis: Secondary | ICD-10-CM | POA: Insufficient documentation

## 2019-06-30 DIAGNOSIS — J343 Hypertrophy of nasal turbinates: Secondary | ICD-10-CM | POA: Insufficient documentation

## 2019-06-30 MED FILL — FLUTICASONE PROP 50 MCG SPR: 50 | 60 days supply | Qty: 16 | Fill #0

## 2019-07-21 MED FILL — GABAPENTIN 300 MG CAPSULE: 300 | 30 days supply | Qty: 90 | Fill #0

## 2019-07-21 MED FILL — PARoxetine HCL 20 MG TABS: 20 | 30 days supply | Qty: 30 | Fill #1

## 2019-07-23 MED FILL — CELECOXIB 200 MG CAP: 200 | 30 days supply | Qty: 30 | Fill #0

## 2019-07-25 MED FILL — ATOMOXETINE HCL 40 MG CAPS: 40 | 28 days supply | Qty: 42 | Fill #0

## 2019-08-21 MED FILL — CELECOXIB 200 MG CAP: 200 | 30 days supply | Qty: 30 | Fill #1

## 2019-08-21 MED FILL — PARoxetine HCL 20 MG TABS: 20 | 30 days supply | Qty: 30 | Fill #2

## 2019-08-22 MED FILL — traZODone HCL 100 MG TABS: 100 | 90 days supply | Qty: 90 | Fill #0

## 2019-10-06 MED FILL — ATOMOXETINE HCL 40 MG CAPS: 40 | 28 days supply | Qty: 42 | Fill #0

## 2019-10-06 MED FILL — NYSTATIN-TRIAMCINOLONE CRM: 100000-0.1 | 10 days supply | Qty: 30 | Fill #0

## 2019-10-20 MED FILL — GABAPENTIN 300 MG CAPSULE: 300 | 30 days supply | Qty: 90 | Fill #1

## 2019-10-20 MED FILL — PARoxetine HCL 20 MG TABS: 20 | 30 days supply | Qty: 30 | Fill #3

## 2019-11-04 MED FILL — ESTRADIOL 10 MCG TABS: 10 | 14 days supply | Qty: 14 | Fill #0

## 2019-11-24 ENCOUNTER — Other Ambulatory Visit: Payer: Self-pay | Admitting: Family Medicine

## 2019-11-24 DIAGNOSIS — Z1231 Encounter for screening mammogram for malignant neoplasm of breast: Secondary | ICD-10-CM

## 2019-12-05 MED FILL — ATOMOXETINE HCL 40 MG CAPS: 40 | 28 days supply | Qty: 42 | Fill #1

## 2019-12-05 MED FILL — PARoxetine HCL 20 MG TABS: 20 | 30 days supply | Qty: 30 | Fill #4

## 2019-12-24 DIAGNOSIS — M25511 Pain in right shoulder: Secondary | ICD-10-CM

## 2019-12-24 HISTORY — DX: Pain in right shoulder: M25.511

## 2020-01-05 ENCOUNTER — Other Ambulatory Visit: Payer: Self-pay

## 2020-01-05 ENCOUNTER — Ambulatory Visit
Admission: RE | Admit: 2020-01-05 | Discharge: 2020-01-05 | Disposition: A | Discharge status: Home / Self Care | Payer: No Typology Code available for payment source | Source: Ambulatory Visit | Attending: Family Medicine | Admitting: Family Medicine

## 2020-01-05 DIAGNOSIS — Z1231 Encounter for screening mammogram for malignant neoplasm of breast: Secondary | ICD-10-CM

## 2020-01-12 MED FILL — traZODone HCL 100 MG TABS: 100 | 90 days supply | Qty: 90 | Fill #1

## 2020-01-12 MED FILL — GABAPENTIN 300 MG CAPSULE: 300 | 30 days supply | Qty: 90 | Fill #2

## 2020-01-12 MED FILL — PARoxetine HCL 20 MG TABS: 20 | 30 days supply | Qty: 30 | Fill #5

## 2020-03-05 MED FILL — PARoxetine HCL 20 MG TABS: 20 | 30 days supply | Qty: 30 | Fill #6

## 2020-03-10 ENCOUNTER — Other Ambulatory Visit: Payer: Self-pay

## 2020-03-10 ENCOUNTER — Ambulatory Visit (INDEPENDENT_AMBULATORY_CARE_PROVIDER_SITE_OTHER): Payer: No Typology Code available for payment source | Admitting: Psychiatry

## 2020-03-10 ENCOUNTER — Encounter: Payer: Self-pay | Admitting: Psychiatry

## 2020-03-10 VITALS — BP 105/63 | HR 56 | Ht 64.0 in | Wt 127.0 lb

## 2020-03-10 DIAGNOSIS — F411 Generalized anxiety disorder: Secondary | ICD-10-CM

## 2020-03-10 MED ORDER — BUSPIRONE HCL 15 MG PO TABS: ORAL_TABLET | ORAL | 1 refills | Status: DC

## 2020-03-10 MED FILL — busPIRone HCL 15 MG TABS: 15 | 30 days supply | Qty: 60 | Fill #0

## 2020-03-10 NOTE — Progress Notes (Signed)
[facility] MD/PA/NP Initial Note  03/10/2020 12:48 PM Debra Rhodes  MRN:  [id number]  Chief Complaint:  Chief Complaint    Anxiety; Insomnia; Other      HPI: Patient is a 59 year old female being seen for initial evaluation for anxiety and mood signs symptoms.  She is being seen after her therapist, Thomasene Mohair, [facility] recommended that patient may want to have her medications reevaluated.  Patient reports that she has been diagnosed with anxiety, depression, and ADD in the past. She has been in recovery for 4 years and had brief 4-day relapse in March with last use on March 22.  She reports experiencing worsening anxiety and depression with some vague SI before most recent relapse.  She reports that her mood and anxiety has improved some since then.  She reports that she has had depression and anxiety for as long as she can remember with first suicide attempt at 41. Recalls feeling sad and fearful and crying frequently as child. She reports that she has long-standing social anxiety and reports that social anxiety is not as severe now compared to childhood. Long-stadning worry and rumination. Has occ stomach upset and HA's with anxiety. Has had panic attacks but not as many compared to the past. Has not had a full blown panic attack since her 30's. She reports that she occasionally feels a drive to straighten certain things and other things do not upset her. Denies any checking behaviors. Occ night sweats. Notices muscle tension in jaw and tongue from clinching at night and thinks this has worsened over the last 7 months  She reports that her depression has been improving and there is only some depression remaining and tends to be more noticeable at night. She reports some irritability and impatience, and that this varies some. She reports that she has difficulty falling asleep and stay asleep and will think about different things after taking Trazodone. Estimates sleeping 5-6 hours a night.  Currently waking up frequently. She reports that she tends to stress eat. Denies any change in weight. She reports that her energy varies in response to her mood. Motivation also varies. She reports that concentration also varies and some days she is daydreaming and is more focused when she is hyper. Reports occ passive death wishes. Denies SI.   She reports h/o impulsivity. She reports that when she has a high stress events that she can "go on high octane and not sleep as much." Reports that her mood will be elevated.  Will exercise more. Has periods of increased energy and increased goal directed activity. Occ excessive spending. Denies high risk behavior. She reports that she has periods of increased self-confidence. She reports that her sex drive is higher at times. She reports that she has more racing thoughts and is more social at times. Periods tend to last several days. She reports that the longest it has lasted was 2 weeks. She reports that these periods are triggered by major life events.   Reports that she was dx'd with ADD in her 55's. Reports that her old report cards showed that she was talking frequently in class and not paying attention. She reports that she struggled in school and did better in college when the material was more interesting. Reports that she will start multiple projects without completing them and will jump to different activities. She reports that she frequently loses and misplaces things. She reports difficulty prioritizing things.   Has had a few periods of severe depression, particularly while she  was actively drinking. Estimates 4-5 severe depressive episodes in her lifetime. Reports periods of milder depression.   Denies AH or VH. Denies paranoia.   Born and raised in [place], [place]. She has 5 older living siblings and one sibling that is deceased (brother committed suicide). She was the baby. She reports that she was the youngest of 81 and all but one of her  siblings had moved out. She reports that her parents did not talk much about their feelings. Completed bachelor's degree. 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 Married twice and divorced twice. Had 3 daughters with second husband of 42 years. 73 yo daughter had recent serious health issue. Daughters are 74, 7, and 68 yo that are in [place]. Moved to [place] 4 years ago. Broke up with fiance and had knee replacement in June. Had to move in August. Daughters are supportive. Has a close relationship with 3 of her siblings and also friends. Active in AA. Loves to hike. Has started enjoying photography, music, painting furniture, reading, cooking, and baking.   Past Psychiatric Medication Trials: Paxil- Has taken it since 59 yo. Has had times where it has been less effective. Prozac- felt more anxious Wellbutrin- increased irritability Trazodone-helpful but causes some excessive somnolence, especially at 100 mg dose.  Strattera- Effective but feels "wired" Gabapentin- Takes as needed for anxiety and leg cramps.   Visit Diagnosis:    ICD-10-CM   1. Generalized anxiety disorder  F41.1 busPIRone (BUSPAR) 15 MG tablet    Past Psychiatric History: Reports that she has been hospitalized twice when using ETOH heavily and was suicidal in her 71's. Reports that she was drinking 2 bottles of wine daily. Reports h/o overdose attempt. Was in [employer]. Was seeing a psychiatrist, Dr. Jerold Coombe, in [place]. Has seen different therapists in the past. Currently seeing Thomasene Mohair since last June.   Past Medical History:  Past Medical History:  Diagnosis Date  . Anxiety   . PONV (postoperative nausea and vomiting)     Past Surgical History:  Procedure Laterality Date  . AUGMENTATION MAMMAPLASTY Bilateral   . BACK SURGERY    . CESAREAN SECTION    . FRACTURE SURGERY    . 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 Reports that parents did not talk about family history.   Family History:  Family History  Problem Relation Age of Onset  . Anxiety disorder Sister   . Depression Brother   . Schizophrenia Brother   . Suicidality Brother   . Anxiety disorder Brother   . Alcohol abuse Sister   . Alcohol abuse Brother   . Alcohol abuse Brother     Social History:  Social History   Socioeconomic History  . Marital status: Divorced    Spouse name: Not on file  . Number of children: Not on file  . Years of education: Not on file  . Highest education level: Not on file  Occupational History  . Not on file  Tobacco Use  . Smoking status: Never Smoker  . Smokeless tobacco: Never Used  Substance and Sexual Activity  . 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 Drug use: No  . Sexual activity: Not on file  Other Topics Concern  .  Not on file  Social History Narrative  . Not on file   Social Determinants of Health   Financial Resource Strain:   . Difficulty of Paying Living Expenses:   Food Insecurity:   . Worried About Charity fundraiser in the Last Year:   . Arboriculturist in the Last Year:   Transportation Needs:   . Film/video editor (Medical):   Marland Kitchen Lack of Transportation (Non-Medical):   Physical Activity:   . Days of Exercise per Week:   . Minutes of Exercise per Session:   Stress:   . Feeling of Stress :   Social Connections:   . Frequency of Communication with Friends and Family:   . Frequency of Social Gatherings with Friends and Family:   . Attends Religious Services:   . Active Member of Clubs or Organizations:   . Attends Archivist Meetings:   Marland Kitchen Marital Status:     Allergies:  Allergies  Allergen Reactions  . Latex Shortness Of Breath, Swelling and Rash    Metabolic Disorder  Labs: No results found for: HGBA1C, MPG No results found for: PROLACTIN No results found for: CHOL, TRIG, HDL, CHOLHDL, VLDL, LDLCALC No results found for: TSH  Therapeutic Level Labs: No results found for: LITHIUM No results found for: VALPROATE No components found for:  CBMZ  Current Medications: Current Outpatient Medications  Medication Sig Dispense Refill  . Biotin w/ Vitamins C & E (HAIR/SKIN/NAILS PO) Take 1 tablet by mouth daily.    . fexofenadine (ALLEGRA) 180 MG tablet Take 180 mg by mouth daily.     Marland Kitchen gabapentin (NEURONTIN) 100 MG capsule Take 100 mg by mouth daily as needed (pain).     . Magnesium 500 MG TABS Take 500 mg by mouth daily.    . Omega-3 Fatty Acids (FISH OIL) 1000 MG CAPS Take by mouth.    Marland Kitchen PARoxetine (PAXIL) 20 MG tablet Take 20 mg by mouth daily.    . traZODone (DESYREL) 50 MG tablet Take 50-75 mg by mouth at bedtime.     . busPIRone (BUSPAR) 15 MG tablet Take 1/3 tablet p.o. twice daily for 1 week, then take 2/3 tablet p.o. twice daily for 1 week, then take 1 tablet p.o. twice daily 60 tablet 1  . ferrous sulfate (FERROUSUL) 325 (65 FE) MG tablet Take 1 tablet (325 mg total) by mouth 3 (three) times daily with meals for 14 days. 42 tablet 0   No current facility-administered medications for this visit.    Medication Side Effects: none  Orders placed this visit:  No orders of the defined types were placed in this encounter.   Psychiatric Specialty Exam:  Review of Systems  Constitutional: Negative.   HENT: Positive for hearing loss and tinnitus.   Eyes: Positive for redness.  Respiratory: Negative.   Cardiovascular: Negative.   Gastrointestinal: Negative.   Endocrine: Negative.   Genitourinary: Negative.   Musculoskeletal: Negative.   Skin: Negative.   Allergic/Immunologic: Negative.   Neurological: Negative.   Hematological: Negative.   Psychiatric/Behavioral:       Please refer to HPI    There were no vitals taken for this visit.There  is no height or weight on file to calculate BMI.  General Appearance: Neat and Well Groomed  Eye Contact:  Good  Speech:  Clear and Coherent and Talkative  Volume:  Normal  Mood:  Anxious  Affect:  Appropriate, Congruent and Full Range  Thought Process:  Coherent, Linear and  Descriptions of Associations: Intact  Orientation:  Full (Time, Place, and Person)  Thought Content: Logical and Hallucinations: None   Suicidal Thoughts:  No  Homicidal Thoughts:  No  Memory:  WNL  Judgement:  Good  Insight:  Good  Psychomotor Activity:  Normal  Concentration:  Concentration: Fair and Attention Span: Good  Recall:  Good  Fund of Knowledge: Good  Language: Good  Assets:  Communication Skills Desire for Improvement Resilience Social Support Talents/Skills  ADL's:  Intact  Cognition: WNL  Prognosis:  Good   Receiving Psychotherapy: Yes   Treatment Plan/Recommendations: Patient seen for 60 minutes and time spent counseling patient regarding mood and anxiety signs and symptoms.  Discussed mood charting and provided patient with copy of mood chart to track mood signs and symptoms.  Discussed that she is describing some possible mildly elevated moods alternating with mildly depressed moods, with some periods of severe depression during her lifetime.  Discussed that she has also had some adverse effects with antidepressants with worsening anxiety and/or irritability in the past, and therefore may better tolerate buspirone for anxiety.  Discussed potential benefits, risks, and side effects of BuSpar.  Patient agrees to starting trial of BuSpar. Start BuSpar 15 mg 1/3 tablet twice daily for 1 week, then increase to 2/3 tablet twice daily for 1 week, then increase to 1 tablet twice daily for anxiety.  Will continue Paxil 20 mg daily at this time and may consider decrease in the future based upon response to BuSpar.  Discussed also considering a medication such as lamotrigine in the future that may be helpful  for depressive signs and symptoms and stabilizing mood.  Recommend continuing psychotherapy.  Patient to follow-up with this provider in 6 weeks or sooner if clinically indicated. Patient advised to contact office with any questions, adverse effects, or acute worsening in signs and symptoms.     Thayer Headings, PMHNP

## 2020-03-16 ENCOUNTER — Ambulatory Visit: Payer: No Typology Code available for payment source | Admitting: Adult Health

## 2020-03-24 MED FILL — GABAPENTIN 300 MG CAPSULE: 300 | 30 days supply | Qty: 90 | Fill #3

## 2020-04-08 ENCOUNTER — Telehealth: Payer: Self-pay | Admitting: Psychiatry

## 2020-04-08 NOTE — Telephone Encounter (Signed)
Patient called and left a message stating that new medication and she is having side effects. Please call her to discuss the next steps. [place]

## 2020-04-09 NOTE — Telephone Encounter (Signed)
Patient notified and will follow up once symptoms resolved.

## 2020-04-21 ENCOUNTER — Ambulatory Visit (INDEPENDENT_AMBULATORY_CARE_PROVIDER_SITE_OTHER): Payer: No Typology Code available for payment source | Admitting: Psychiatry

## 2020-04-21 ENCOUNTER — Other Ambulatory Visit: Payer: Self-pay

## 2020-04-21 ENCOUNTER — Encounter: Payer: Self-pay | Admitting: Psychiatry

## 2020-04-21 DIAGNOSIS — F411 Generalized anxiety disorder: Secondary | ICD-10-CM

## 2020-04-21 DIAGNOSIS — F329 Major depressive disorder, single episode, unspecified: Secondary | ICD-10-CM

## 2020-04-21 DIAGNOSIS — F32A Depression, unspecified: Secondary | ICD-10-CM

## 2020-04-21 MED ORDER — SERTRALINE HCL 50 MG PO TABS: ORAL_TABLET | ORAL | 1 refills | Status: DC

## 2020-04-21 MED ORDER — PAROXETINE HCL 10 MG PO TABS: ORAL_TABLET | ORAL | 0 refills | Status: DC

## 2020-04-21 MED FILL — PARoxetine HCL 10 MG TABS: 10 | 30 days supply | Qty: 45 | Fill #0

## 2020-04-21 MED FILL — SERTRALINE HCL 50 MG TABS: 50 | 30 days supply | Qty: 45 | Fill #0

## 2020-04-21 NOTE — Patient Instructions (Signed)
Decrease Paxil to 15 mg (1.5 of a 10 mg tab) for 10 days, then decrease to 10 mg daily for 10 days, then decrease to 5 mg (1/2 of a 10 mg tab) for 10 days, then stop.   Start Sertraline (Zoloft) 25 mg (1/2 of a 50 mg tab) for 10 days, then increase to 50 mg daily for 10 days, then increase to 75 mg (1.5 of the 50 mg tabs daily.   Start Sertraline while decreasing Paxil. Call with any side effects or significant discontinuation signs and symptoms.

## 2020-04-21 NOTE — Progress Notes (Signed)
Debra Rhodes [id number] Aug 31, 1961 59 y.o.  Subjective:   Patient ID:  Debra Rhodes is a 59 y.o. (DOB 1961/07/06) female.  Chief Complaint:  Chief Complaint  Patient presents with  . Anxiety  . Depression    HPI Debra Rhodes presents to the office today for follow-up of anxiety and depression. She reports that she tolerated lowest dose of Buspar without difficulty. She reports that when she increased dose of Buspar she became "agitated" to the point where her mother and daughter noticed. She reports that when she came off Buspar she immediately felt more depressed which was around the same time that her daughter was in the hospital. Agitation improved after stopping Buspar and anxiety returned to baseline. Came close to a panic attack on Sunday and used deep breathing and coping skills to redirect. Continued worry and rumination.   She reports that she had a "really low day" on Sunday which could have been due to being alone on a holiday weekend. Mood has been low overall. She reports that her energy and motivation have been lower to the point where she was not as motivated towards self-hygiene/self-care. Has not been wanting to do chores. Has been not wanting to get out of bed. Sleeping sometimes 11 hours a night which is significantly more than usual. She had been socially withdrawn. Appetite has been slightly decreased. Continuing to eat 2-3 meals a day. She reports poor concentration and focus. Reports being less productive at work. Diminished interest and enjoyment in things. Had brief episode of fleeting vague suicidal thoughts. Denies SI.  Reports brain zaps, "buzzing," and equilibrium is off with missed doses of Paxil  Active in meetings.    Past Psychiatric Medication Trials: Paxil- Has taken it since 59 yo. Has had times where it has been less effective. Had wt gain and sexual side effects at higher doses (40 mg and 60 mg). Prozac- felt more anxious Wellbutrin- increased  irritability Trazodone-helpful but causes some excessive somnolence, especially at 100 mg dose.  Strattera- Effective but feels "wired" Gabapentin- Takes as needed for anxiety and leg cramps.  Buspar-Increased agitation and anxiety  Review of Systems:  Review of Systems  Gastrointestinal: Negative.   Musculoskeletal: Negative for gait problem.  Neurological: Negative for tremors and headaches.  Hematological:       Iron has been low and this is causing some fatigue. Just started Iron and plans to f/u with PCP if this does not improve.   Psychiatric/Behavioral:       Please refer to HPI    Medications: I have reviewed the patient's current medications.  Current Outpatient Medications  Medication Sig Dispense Refill  . Biotin w/ Vitamins C & E (HAIR/SKIN/NAILS PO) Take 1 tablet by mouth daily.    . fexofenadine (ALLEGRA) 180 MG tablet Take 180 mg by mouth daily.     Marland Kitchen gabapentin (NEURONTIN) 100 MG capsule Take 100 mg by mouth daily as needed (pain).     Marland Kitchen glucosamine-chondroitin 500-400 MG tablet Take 1 tablet by mouth daily.    . Magnesium 500 MG TABS Take 500 mg by mouth daily.    . Omega-3 Fatty Acids (FISH OIL) 1000 MG CAPS Take by mouth.    Marland Kitchen PARoxetine (PAXIL) 10 MG tablet Take 1.5 tabs po qd for 10 days, then 1 tab po qd for 10 days, then 1/2 tab qd for 10 days 45 tablet 0  . traZODone (DESYREL) 50 MG tablet Take 50-75 mg by mouth at bedtime.     Marland Kitchen  ferrous sulfate (FERROUSUL) 325 (65 FE) MG tablet Take 1 tablet (325 mg total) by mouth 3 (three) times daily with meals for 14 days. 42 tablet 0  . sertraline (ZOLOFT) 50 MG tablet Take 1/2 tab po qd x 10 days, then 1 tab po qd x 10 days, then 1.5 tabs po qd 45 tablet 1   No current facility-administered medications for this visit.    Medication Side Effects: Other: Increased agitation with Buspar  Allergies:  Allergies  Allergen Reactions  . Latex Shortness Of Breath, Swelling and Rash    Past Medical History:  Diagnosis  Date  . Anxiety   . PONV (postoperative nausea and vomiting)     Family History  Problem Relation Age of Onset  . Anxiety disorder Sister   . Depression Brother   . Schizophrenia Brother   . Suicidality Brother   . Anxiety disorder Brother   . Alcohol abuse Sister   . Alcohol abuse Brother   . Alcohol abuse Brother     Social History   Socioeconomic History  . Marital status: Divorced    Spouse name: Not on file  . Number of children: Not on file  . Years of education: Not on file  . Highest education level: Not on file  Occupational History  . Not on file  Tobacco Use  . Smoking status: Never Smoker  . Smokeless tobacco: Never Used  Substance and Sexual Activity  . 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 Drug use: No  . Sexual activity: Not on file  Other Topics Concern  . Not on file  Social History Narrative  . Not on file   Social Determinants of Health   Financial Resource Strain:   . Difficulty of Paying Living Expenses:   Food Insecurity:   . Worried About Charity fundraiser in the Last Year:   . Arboriculturist in the Last Year:   Transportation Needs:   . Film/video editor (Medical):   Marland Kitchen Lack of Transportation (Non-Medical):   Physical Activity:   . Days of Exercise per Week:   . Minutes of Exercise per Session:   Stress:   . Feeling of Stress :   Social Connections:   . Frequency of Communication with Friends and Family:   . Frequency of Social Gatherings with Friends and Family:   . Attends Religious Services:   . Active Member of Clubs or Organizations:   . Attends Archivist Meetings:   Marland Kitchen Marital Status:   Intimate Partner Violence:   . Fear of Current or Ex-Partner:   . Emotionally Abused:   Marland Kitchen Physically Abused:   . Sexually Abused:     Past Medical History, Surgical history, Social history, and Family history were reviewed and updated as appropriate.   Please see review of systems for further  details on the patient's review from today.   Objective:   Physical Exam:  There were no vitals taken for this visit.  Physical Exam Constitutional:      General: She is not in acute distress. Musculoskeletal:        General: No deformity.  Neurological:     Mental Status: She is alert and oriented to person, place, and time.     Coordination: Coordination normal.  Psychiatric:        Attention and Perception: Attention and perception normal. She does not perceive auditory or visual hallucinations.  Mood and Affect: Mood is anxious and depressed. Affect is not labile, blunt, angry or inappropriate.        Speech: Speech normal.        Behavior: Behavior normal.        Thought Content: Thought content normal. Thought content is not paranoid or delusional. Thought content does not include homicidal or suicidal ideation. Thought content does not include homicidal or suicidal plan.        Cognition and Memory: Cognition and memory normal.        Judgment: Judgment normal.     Comments: Insight intact     Lab Review:     Component Value Date/Time   NA 140 05/14/2019 0317   K 4.9 05/14/2019 0317   CL 105 05/14/2019 0317   CO2 28 05/14/2019 0317   GLUCOSE 144 (H) 05/14/2019 0317   BUN 8 05/14/2019 0317   CREATININE 0.63 05/14/2019 0317   CALCIUM 8.9 05/14/2019 0317   GFRNONAA >60 05/14/2019 0317   GFRAA >60 05/14/2019 0317       Component Value Date/Time   WBC 7.6 05/14/2019 0317   RBC 3.80 (L) 05/14/2019 0317   HGB 11.4 (L) 05/14/2019 0317   HCT 36.0 05/14/2019 0317   PLT 203 05/14/2019 0317   MCV 94.7 05/14/2019 0317   MCH 30.0 05/14/2019 0317   MCHC 31.7 05/14/2019 0317   RDW 12.4 05/14/2019 0317    No results found for: POCLITH, LITHIUM   No results found for: PHENYTOIN, PHENOBARB, VALPROATE, CBMZ   .res Assessment: Plan:   Pt seen for 30 minutes and time spent counseling pt re: possible tx options to include increasing Paxil or switching to another  medication. Pt reports that she has had side effects with higher doses of Paxil in the past and would prefer to switch medications. Discussed potential benefits, risks, and side effects of Sertraline. Discussed potential discontinuation side effects with decreasing and discontinuing Paxil, particularly considering length of treatment. Discussed gradually reducing Paxil and cross-titrating to Sertraline to minimize discontinuation s/s.  Pt to f/u in 4 weeks or sooner if clinically indicated.  Patient advised to contact office with any questions, adverse effects, or acute worsening in signs and symptoms.  Debra Rhodes was seen today for anxiety and depression.  Diagnoses and all orders for this visit:  Generalized anxiety disorder -     PARoxetine (PAXIL) 10 MG tablet; Take 1.5 tabs po qd for 10 days, then 1 tab po qd for 10 days, then 1/2 tab qd for 10 days -     sertraline (ZOLOFT) 50 MG tablet; Take 1/2 tab po qd x 10 days, then 1 tab po qd x 10 days, then 1.5 tabs po qd  Depression, unspecified depression type -     PARoxetine (PAXIL) 10 MG tablet; Take 1.5 tabs po qd for 10 days, then 1 tab po qd for 10 days, then 1/2 tab qd for 10 days -     sertraline (ZOLOFT) 50 MG tablet; Take 1/2 tab po qd x 10 days, then 1 tab po qd x 10 days, then 1.5 tabs po qd     Please see After Visit Summary for patient specific instructions.  Future Appointments  Date Time Provider [place]  05/19/2020  8:00 AM Thayer Headings, PMHNP [facility] None    No orders of the defined types were placed in this encounter.   -------------------------------

## 2020-05-19 ENCOUNTER — Encounter: Payer: Self-pay | Admitting: Psychiatry

## 2020-05-19 ENCOUNTER — Other Ambulatory Visit: Payer: Self-pay

## 2020-05-19 ENCOUNTER — Ambulatory Visit (INDEPENDENT_AMBULATORY_CARE_PROVIDER_SITE_OTHER): Payer: No Typology Code available for payment source | Admitting: Psychiatry

## 2020-05-19 VITALS — BP 104/72 | HR 63

## 2020-05-19 DIAGNOSIS — F411 Generalized anxiety disorder: Secondary | ICD-10-CM | POA: Diagnosis not present

## 2020-05-19 DIAGNOSIS — F329 Major depressive disorder, single episode, unspecified: Secondary | ICD-10-CM | POA: Diagnosis not present

## 2020-05-19 DIAGNOSIS — F32A Depression, unspecified: Secondary | ICD-10-CM

## 2020-05-19 DIAGNOSIS — G629 Polyneuropathy, unspecified: Secondary | ICD-10-CM | POA: Diagnosis not present

## 2020-05-19 MED ORDER — SERTRALINE HCL 100 MG PO TABS: 100.0000 mg | ORAL_TABLET | Freq: Every day | ORAL | 0 refills | Status: DC

## 2020-05-19 MED ORDER — GABAPENTIN 100 MG PO CAPS: 100.0000 mg | ORAL_CAPSULE | Freq: Three times a day (TID) | ORAL | 0 refills | Status: DC | PRN

## 2020-05-19 MED FILL — SERTRALINE HCL 100 MG TABS: 100 | 90 days supply | Qty: 90 | Fill #0

## 2020-05-19 NOTE — Progress Notes (Signed)
Debra Rhodes [id number] 12-09-1960 59 y.o.  Subjective:   Patient ID:  Debra Rhodes is a 59 y.o. (DOB [birth year]) female.  Chief Complaint:  Chief Complaint  Patient presents with  . Anxiety  . Follow-up    h/o Depression, and Trazodone    HPI Debra Rhodes presents to the office today for follow-up of depression, anxiety, and insomnia. She reports that she increased Sertraline to 100 mg po qd about 5 days ago. She reports that she has had some increased stressors that have likely increased anxiety. She reports that she feels nervous. Reports chronic worry since childhood. She reports some obsessive thoughts and perfectionistic thinking. She reports some rumination about upcoming presentation. She reports that she has been using Gabapentin prn more often, about 1-2 times daily. She reports that she will have increased heart rate, sweating, and shaking with acute anxiety. She reports that sleep has improved. Appetite has been good. She reports that she tends to stress eat and will crave sugar. She reports that her depression, "seems to be gone." Motivation has been improved. Energy has improved. She reports that concentration and focus has been "awful" and attributes this to stress. Denies SI.   Past Psychiatric Medication Trials: Paxil- Has taken it since 59 yo. Has had times where it has been less effective. Had wt gain and sexual side effects at higher doses (40 mg and 60 mg). Prozac- felt more anxious Sertraline Wellbutrin- increased irritability Trazodone-helpful but causes some excessive somnolence, especially at 100 mg dose.  Strattera- Effective but feels "wired" Gabapentin- Takes as needed for anxiety and leg cramps. Buspar-Increased agitation and anxiety    Review of Systems:  Review of Systems  Gastrointestinal: Negative.   Musculoskeletal: Negative for gait problem.  Neurological: Negative for tremors.  Psychiatric/Behavioral:       Please refer to HPI     Medications: I have reviewed the patient's current medications.  Current Outpatient Medications  Medication Sig Dispense Refill  . Biotin w/ Vitamins C & E (HAIR/SKIN/NAILS PO) Take 1 tablet by mouth daily.    . fexofenadine (ALLEGRA) 180 MG tablet Take 180 mg by mouth daily.     Marland Kitchen gabapentin (NEURONTIN) 100 MG capsule Take 1 capsule (100 mg total) by mouth 3 (three) times daily as needed (pain). 270 capsule 0  . glucosamine-chondroitin 500-400 MG tablet Take 1 tablet by mouth daily.    . Magnesium 500 MG TABS Take 500 mg by mouth daily.    . Omega-3 Fatty Acids (FISH OIL) 1000 MG CAPS Take by mouth.    . sertraline (ZOLOFT) 100 MG tablet Take 1 tablet (100 mg total) by mouth daily. 90 tablet 0  . traZODone (DESYREL) 50 MG tablet Take 50-75 mg by mouth at bedtime.     . ferrous sulfate (FERROUSUL) 325 (65 FE) MG tablet Take 1 tablet (325 mg total) by mouth 3 (three) times daily with meals for 14 days. 42 tablet 0   No current facility-administered medications for this visit.    Medication Side Effects: None  Allergies:  Allergies  Allergen Reactions  . Latex Shortness Of Breath, Swelling and Rash    Past Medical History:  Diagnosis Date  . Anxiety   . PONV (postoperative nausea and vomiting)     Family History  Problem Relation Age of Onset  . Anxiety disorder Sister   . Depression Brother   . Schizophrenia Brother   . Suicidality Brother   . Anxiety disorder Brother   . Alcohol  abuse Sister   . Alcohol abuse Brother   . Alcohol abuse Brother     Social History   Socioeconomic History  . Marital status: Divorced    Spouse name: Not on file  . Number of children: Not on file  . Years of education: Not on file  . Highest education level: Not on file  Occupational History  . Not on file  Tobacco Use  . Smoking status: Never Smoker  . Smokeless tobacco: Never Used  Substance and Sexual Activity  . 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  Drug use: No  . Sexual activity: Not on file  Other Topics Concern  . Not on file  Social History Narrative  . Not on file   Social Determinants of Health   Financial Resource Strain:   . Difficulty of Paying Living Expenses:   Food Insecurity:   . Worried About Charity fundraiser in the Last Year:   . Arboriculturist in the Last Year:   Transportation Needs:   . Film/video editor (Medical):   Marland Kitchen Lack of Transportation (Non-Medical):   Physical Activity:   . Days of Exercise per Week:   . Minutes of Exercise per Session:   Stress:   . Feeling of Stress :   Social Connections:   . Frequency of Communication with Friends and Family:   . Frequency of Social Gatherings with Friends and Family:   . Attends Religious Services:   . Active Member of Clubs or Organizations:   . Attends Archivist Meetings:   Marland Kitchen Marital Status:   Intimate Partner Violence:   . Fear of Current or Ex-Partner:   . Emotionally Abused:   Marland Kitchen Physically Abused:   . Sexually Abused:     Past Medical History, Surgical history, Social history, and Family history were reviewed and updated as appropriate.   Please see review of systems for further details on the patient's review from today.   Objective:   Physical Exam:  BP 104/72   Pulse 63   Physical Exam Constitutional:      General: She is not in acute distress. Musculoskeletal:        General: No deformity.  Neurological:     Mental Status: She is alert and oriented to person, place, and time.     Coordination: Coordination normal.  Psychiatric:        Attention and Perception: Attention and perception normal. She does not perceive auditory or visual hallucinations.        Mood and Affect: Mood is anxious. Mood is not depressed. Affect is not labile, blunt, angry or inappropriate.        Speech: Speech normal.        Behavior: Behavior normal.        Thought Content: Thought content normal. Thought content is not paranoid or  delusional. Thought content does not include homicidal or suicidal ideation. Thought content does not include homicidal or suicidal plan.        Cognition and Memory: Cognition and memory normal.        Judgment: Judgment normal.     Comments: Insight intact     Lab Review:     Component Value Date/Time   NA 140 05/14/2019 0317   K 4.9 05/14/2019 0317   CL 105 05/14/2019 0317   CO2 28 05/14/2019 0317   GLUCOSE 144 (H) 05/14/2019 0317   BUN 8 05/14/2019 0317  CREATININE 0.63 05/14/2019 0317   CALCIUM 8.9 05/14/2019 0317   GFRNONAA >60 05/14/2019 0317   GFRAA >60 05/14/2019 0317       Component Value Date/Time   WBC 7.6 05/14/2019 0317   RBC 3.80 (L) 05/14/2019 0317   HGB 11.4 (L) 05/14/2019 0317   HCT 36.0 05/14/2019 0317   PLT 203 05/14/2019 0317   MCV 94.7 05/14/2019 0317   MCH 30.0 05/14/2019 0317   MCHC 31.7 05/14/2019 0317   RDW 12.4 05/14/2019 0317    No results found for: POCLITH, LITHIUM   No results found for: PHENYTOIN, PHENOBARB, VALPROATE, CBMZ   .res Assessment: Plan:   Patient seen for 30 minutes and time spent counseling patient regarding possible treatment options for anxiety, to include potential benefits, risks, and side effects of increasing gabapentin, increasing sertraline, or initiating propanolol.  Discussed concerns about possible bradycardia with propanolol since patient reports that she typically has a low heart rate at baseline.  Discussed that more time is needed to determine response to recent increase in sertraline.  Patient also reports that she would not want to further increase sertraline at this time since she has several significant events occurring.  We will therefore continue to monitor response to recent increase in sertraline to 100 mg daily.  Discussed that gabapentin can be used as needed for acute anxiety and that this provider could assume management of gabapentin.  Patient is in agreement with this plan and prescription sent for  gabapentin 100 mg 3 times daily as needed for anxiety. Will continue trazodone for insomnia. Patient to follow-up in 4 weeks or sooner if clinically indicated. Patient advised to contact office with any questions, adverse effects, or acute worsening in signs and symptoms.  Shamirah was seen today for anxiety and follow-up.  Diagnoses and all orders for this visit:  Generalized anxiety disorder -     gabapentin (NEURONTIN) 100 MG capsule; Take 1 capsule (100 mg total) by mouth 3 (three) times daily as needed (pain). -     sertraline (ZOLOFT) 100 MG tablet; Take 1 tablet (100 mg total) by mouth daily.  Depression, unspecified depression type -     sertraline (ZOLOFT) 100 MG tablet; Take 1 tablet (100 mg total) by mouth daily.  Neuropathy -     gabapentin (NEURONTIN) 100 MG capsule; Take 1 capsule (100 mg total) by mouth 3 (three) times daily as needed (pain).     Please see After Visit Summary for patient specific instructions.  Future Appointments  Date Time Provider [place]  06/16/2020  8:30 AM Thayer Headings, PMHNP [facility] None    No orders of the defined types were placed in this encounter.   -------------------------------

## 2020-06-04 MED FILL — GABAPENTIN 300 MG CAPSULE: 300 | 30 days supply | Qty: 90 | Fill #0

## 2020-06-04 MED FILL — traZODone HCL 100 MG TABS: 100 | 90 days supply | Qty: 90 | Fill #0

## 2020-06-16 ENCOUNTER — Ambulatory Visit (INDEPENDENT_AMBULATORY_CARE_PROVIDER_SITE_OTHER): Payer: No Typology Code available for payment source | Admitting: Psychiatry

## 2020-06-16 ENCOUNTER — Encounter: Payer: Self-pay | Admitting: Psychiatry

## 2020-06-16 ENCOUNTER — Other Ambulatory Visit: Payer: Self-pay

## 2020-06-16 DIAGNOSIS — F411 Generalized anxiety disorder: Secondary | ICD-10-CM

## 2020-06-16 DIAGNOSIS — G629 Polyneuropathy, unspecified: Secondary | ICD-10-CM

## 2020-06-16 DIAGNOSIS — G4700 Insomnia, unspecified: Secondary | ICD-10-CM

## 2020-06-16 DIAGNOSIS — F32A Depression, unspecified: Secondary | ICD-10-CM

## 2020-06-16 DIAGNOSIS — F329 Major depressive disorder, single episode, unspecified: Secondary | ICD-10-CM | POA: Diagnosis not present

## 2020-06-16 MED ORDER — GABAPENTIN 300 MG PO CAPS: 300.0000 mg | ORAL_CAPSULE | Freq: Three times a day (TID) | ORAL | 0 refills | Status: DC | PRN

## 2020-06-16 MED ORDER — TRAZODONE HCL 50 MG PO TABS: ORAL_TABLET | ORAL | 0 refills | Status: DC

## 2020-06-16 MED ORDER — SERTRALINE HCL 100 MG PO TABS: 150.0000 mg | ORAL_TABLET | Freq: Every day | ORAL | 0 refills | Status: DC

## 2020-06-16 NOTE — Progress Notes (Signed)
Debra Rhodes [id number] Jan 19, 1961 59 y.o.  Subjective:   Patient ID:  Debra Rhodes is a 59 y.o. (DOB 07/17/1961) female.  Chief Complaint:  Chief Complaint  Patient presents with  . Anxiety  . Follow-up    HPI Debra Rhodes presents to the office today for follow-up of anxiety, depression, and insomnia. She reports, "no depression, but my anxiety" is elevated. She reports that she accepted a new job offer and will be starting Monday. She is going to be training in [place] next week. Has some anxiety about the training. Has been has been having some shaking, increased HR, and shortness of breath at times. Has had some mild GI s/s with anxiety. Denies full blown attacks. She reports some situational anxiety and worry. She reports that she has not been sleeping well for the last 2 weeks. Having some early morning awakening. Has been averaging about 6 hours of sleep a night and typically needs 8-9 hours a night. Appetite has been good. Energy and motivation have been good. Concentration has been impaired. Denies SI.   Has plan in place to help maintain sobriety while traveling.     Past Psychiatric Medication Trials: Paxil- Has taken it since 59 yo. Has had times where it has been less effective.Had wt gain and sexual side effects at higher doses (40 mg and 60 mg). Prozac- felt more anxious Sertraline Wellbutrin- increased irritability Trazodone-helpful but causes some excessive somnolence, especially at 100 mg dose.  Strattera- Effective but feels "wired" Gabapentin- Takes as needed for anxiety and leg cramps. Buspar-Increased agitation and anxiety  Review of Systems:  Review of Systems  Gastrointestinal:       Some GI s/s due to anxiety.   Musculoskeletal: Negative for gait problem.  Neurological: Positive for tremors.  Psychiatric/Behavioral:       Please refer to HPI    Medications: I have reviewed the patient's current medications.  Current Outpatient Medications   Medication Sig Dispense Refill  . Biotin w/ Vitamins C & E (HAIR/SKIN/NAILS PO) Take 1 tablet by mouth daily.    . fexofenadine (ALLEGRA) 180 MG tablet Take 180 mg by mouth daily.     Marland Kitchen glucosamine-chondroitin 500-400 MG tablet Take 1 tablet by mouth daily.    . Magnesium 500 MG TABS Take 500 mg by mouth daily.    . Omega-3 Fatty Acids (FISH OIL) 1000 MG CAPS Take by mouth.    . ferrous sulfate (FERROUSUL) 325 (65 FE) MG tablet Take 1 tablet (325 mg total) by mouth 3 (three) times daily with meals for 14 days. 42 tablet 0  . gabapentin (NEURONTIN) 300 MG capsule Take 1 capsule (300 mg total) by mouth 3 (three) times daily as needed (pain). 270 capsule 0  . sertraline (ZOLOFT) 100 MG tablet Take 1.5 tablets (150 mg total) by mouth daily. 135 tablet 0  . traZODone (DESYREL) 50 MG tablet Take 50-75 mg by mouth at bedtime. 135 tablet 0   No current facility-administered medications for this visit.    Medication Side Effects: None  Allergies:  Allergies  Allergen Reactions  . Latex Shortness Of Breath, Swelling and Rash    Past Medical History:  Diagnosis Date  . Anxiety   . PONV (postoperative nausea and vomiting)     Family History  Problem Relation Age of Onset  . Anxiety disorder Sister   . Depression Brother   . Schizophrenia Brother   . Suicidality Brother   . Anxiety disorder Brother   .  Alcohol abuse Sister   . Alcohol abuse Brother   . Alcohol abuse Brother     Social History   Socioeconomic History  . Marital status: Divorced    Spouse name: Not on file  . Number of children: Not on file  . Years of education: Not on file  . Highest education level: Not on file  Occupational History  . Not on file  Tobacco Use  . Smoking status: Never Smoker  . Smokeless tobacco: Never Used  Substance and Sexual Activity  . 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 Drug use: No  . Sexual activity: Not on file  Other Topics Concern  . Not on file   Social History Narrative  . Not on file   Social Determinants of Health   Financial Resource Strain:   . Difficulty of Paying Living Expenses:   Food Insecurity:   . Worried About Charity fundraiser in the Last Year:   . Arboriculturist in the Last Year:   Transportation Needs:   . Film/video editor (Medical):   Marland Kitchen Lack of Transportation (Non-Medical):   Physical Activity:   . Days of Exercise per Week:   . Minutes of Exercise per Session:   Stress:   . Feeling of Stress :   Social Connections:   . Frequency of Communication with Friends and Family:   . Frequency of Social Gatherings with Friends and Family:   . Attends Religious Services:   . Active Member of Clubs or Organizations:   . Attends Archivist Meetings:   Marland Kitchen Marital Status:   Intimate Partner Violence:   . Fear of Current or Ex-Partner:   . Emotionally Abused:   Marland Kitchen Physically Abused:   . Sexually Abused:     Past Medical History, Surgical history, Social history, and Family history were reviewed and updated as appropriate.   Please see review of systems for further details on the patient's review from today.   Objective:   Physical Exam:  There were no vitals taken for this visit.  Physical Exam Constitutional:      General: She is not in acute distress. Musculoskeletal:        General: No deformity.  Neurological:     Mental Status: She is alert and oriented to person, place, and time.     Coordination: Coordination normal.  Psychiatric:        Attention and Perception: Attention and perception normal. She does not perceive auditory or visual hallucinations.        Mood and Affect: Mood is anxious. Mood is not depressed. Affect is not labile, blunt, angry or inappropriate.        Speech: Speech normal.        Behavior: Behavior normal.        Thought Content: Thought content normal. Thought content is not paranoid or delusional. Thought content does not include homicidal or suicidal  ideation. Thought content does not include homicidal or suicidal plan.        Cognition and Memory: Cognition and memory normal.        Judgment: Judgment normal.     Comments: Insight intact     Lab Review:     Component Value Date/Time   NA 140 05/14/2019 0317   K 4.9 05/14/2019 0317   CL 105 05/14/2019 0317   CO2 28 05/14/2019 0317   GLUCOSE 144 (H) 05/14/2019 0317   BUN 8  05/14/2019 0317   CREATININE 0.63 05/14/2019 0317   CALCIUM 8.9 05/14/2019 0317   GFRNONAA >60 05/14/2019 0317   GFRAA >60 05/14/2019 0317       Component Value Date/Time   WBC 7.6 05/14/2019 0317   RBC 3.80 (L) 05/14/2019 0317   HGB 11.4 (L) 05/14/2019 0317   HCT 36.0 05/14/2019 0317   PLT 203 05/14/2019 0317   MCV 94.7 05/14/2019 0317   MCH 30.0 05/14/2019 0317   MCHC 31.7 05/14/2019 0317   RDW 12.4 05/14/2019 0317    No results found for: POCLITH, LITHIUM   No results found for: PHENYTOIN, PHENOBARB, VALPROATE, CBMZ   .res Assessment: Plan:   Discussed potential benefits, risks, and side effects of increasing sertraline to 150 mg daily to improve anxiety since patient reports that she is currently experiencing some significant anxiety signs and symptoms.  She reports that her depression has resolved with sertraline 100 mg daily and anxiety signs and symptoms are not fully controlled. Discussed that she can continue to use gabapentin 300 mg 3 times daily as needed for anxiety or insomnia.  Discussed that gabapentin may be helpful for middle of the night awakenings. Discussed taking trazodone 75 mg at bedtime since 50 mg is often not effective and 100 mg causes excessive somnolence. Patient to follow-up in 6 weeks or sooner if clinically indicated. Patient advised to contact office with any questions, adverse effects, or acute worsening in signs and symptoms.  Tanishia was seen today for anxiety and follow-up.  Diagnoses and all orders for this visit:  Insomnia, unspecified type -     traZODone  (DESYREL) 50 MG tablet; Take 50-75 mg by mouth at bedtime.  Generalized anxiety disorder -     gabapentin (NEURONTIN) 300 MG capsule; Take 1 capsule (300 mg total) by mouth 3 (three) times daily as needed (pain). -     sertraline (ZOLOFT) 100 MG tablet; Take 1.5 tablets (150 mg total) by mouth daily.  Neuropathy -     gabapentin (NEURONTIN) 300 MG capsule; Take 1 capsule (300 mg total) by mouth 3 (three) times daily as needed (pain).  Depression, unspecified depression type -     sertraline (ZOLOFT) 100 MG tablet; Take 1.5 tablets (150 mg total) by mouth daily.     Please see After Visit Summary for patient specific instructions.  Future Appointments  Date Time Provider [place]  07/28/2020  8:00 AM Thayer Headings, PMHNP CP[facility] None    No orders of the defined types were placed in this encounter.   -------------------------------

## 2020-06-17 MED FILL — COMBIPATCH 0.05-0.14 MG PTC: 0.05-0.14 | 84 days supply | Qty: 24 | Fill #1

## 2020-07-07 MED FILL — SERTRALINE HCL 100 MG TABS: 100 | 90 days supply | Qty: 135 | Fill #0

## 2020-07-15 MED FILL — GABAPENTIN 300 MG CAPSULE: 300 | 30 days supply | Qty: 90 | Fill #0

## 2020-07-28 ENCOUNTER — Ambulatory Visit (INDEPENDENT_AMBULATORY_CARE_PROVIDER_SITE_OTHER): Payer: BC Managed Care – PPO | Admitting: Psychiatry

## 2020-07-28 ENCOUNTER — Other Ambulatory Visit: Payer: Self-pay

## 2020-07-28 ENCOUNTER — Encounter: Payer: Self-pay | Admitting: Psychiatry

## 2020-07-28 DIAGNOSIS — F411 Generalized anxiety disorder: Secondary | ICD-10-CM

## 2020-07-28 DIAGNOSIS — F329 Major depressive disorder, single episode, unspecified: Secondary | ICD-10-CM | POA: Diagnosis not present

## 2020-07-28 DIAGNOSIS — G4700 Insomnia, unspecified: Secondary | ICD-10-CM | POA: Diagnosis not present

## 2020-07-28 DIAGNOSIS — G629 Polyneuropathy, unspecified: Secondary | ICD-10-CM

## 2020-07-28 DIAGNOSIS — F32A Depression, unspecified: Secondary | ICD-10-CM

## 2020-07-28 MED ORDER — TRAZODONE HCL 50 MG PO TABS: ORAL_TABLET | ORAL | 0 refills | Status: DC

## 2020-07-28 MED ORDER — GABAPENTIN 300 MG PO CAPS: 300.0000 mg | ORAL_CAPSULE | Freq: Three times a day (TID) | ORAL | 0 refills | Status: DC | PRN

## 2020-07-28 MED ORDER — SERTRALINE HCL 100 MG PO TABS: 150.0000 mg | ORAL_TABLET | Freq: Every day | ORAL | 0 refills | Status: DC

## 2020-07-28 MED FILL — traZODone HCL 50 MG TABS: 50 | 90 days supply | Qty: 135 | Fill #0

## 2020-07-28 MED FILL — SERTRALINE HCL 100 MG TABS: 100 | 90 days supply | Qty: 135 | Fill #0

## 2020-07-28 NOTE — Progress Notes (Signed)
Debra Rhodes [id number] May 13, 1961 59 y.o.  Subjective:   Patient ID:  Debra Rhodes is a 59 y.o. (DOB July 19, 1961) female.  Chief Complaint:  Chief Complaint  Patient presents with  . Follow-up    h/o Depression, Anxiety, and insomnia    HPI Debra Rhodes presents to the office today for follow-up of anxiety, depression, and insomnia. One of her best friends committed suicide about 1.5 weeks ago. She reports that her ex-fiance texted her last night that he was having suicidal thoughts. New job is going well but is somewhat stressful. Training went well. She used more gabapentin initially and has been able to reduce gabapentin and plans to use it more as needed. She has been reducing coffee since it seems to be exacerbating anxiety. She reports that she experienced sadness "but didn't go to that dark place." Denies affective dulling. She notices some irritability which is not constant. Felt on the verge of panic when she first started her job. Has had some excessive worry in response to situational stress. Has had some sleep disturbance in response to recent stressors. Appetite has been good and is attempting to decrease sugar. Motivation is low. Reports chronically impaired concentration. Energy is good. Denies SI.   In a new relationship with someone that she has known for 4 years.   Reports that she was able to maintain sobriety while traveling.  H/o seasonal depression.   Review of Systems:  Review of Systems  Musculoskeletal: Negative for gait problem.  Neurological: Negative for tremors.  Psychiatric/Behavioral:       Please refer to HPI    Medications: I have reviewed the patient's current medications.  Current Outpatient Medications  Medication Sig Dispense Refill  . Biotin w/ Vitamins C & E (HAIR/SKIN/NAILS PO) Take 1 tablet by mouth daily.    . fexofenadine (ALLEGRA) 180 MG tablet Take 180 mg by mouth daily.     Marland Kitchen gabapentin (NEURONTIN) 300 MG capsule Take 1 capsule  (300 mg total) by mouth 3 (three) times daily as needed (pain). 270 capsule 0  . glucosamine-chondroitin 500-400 MG tablet Take 1 tablet by mouth daily.    . Magnesium 500 MG TABS Take 500 mg by mouth daily.    . Omega-3 Fatty Acids (FISH OIL) 1000 MG CAPS Take by mouth.    . sertraline (ZOLOFT) 100 MG tablet Take 1.5 tablets (150 mg total) by mouth daily. 135 tablet 0  . traZODone (DESYREL) 50 MG tablet Take 50-75 mg by mouth at bedtime. 135 tablet 0  . ferrous sulfate (FERROUSUL) 325 (65 FE) MG tablet Take 1 tablet (325 mg total) by mouth 3 (three) times daily with meals for 14 days. 42 tablet 0   No current facility-administered medications for this visit.    Medication Side Effects: Other: Concentration impairment with higher doses of gabapentin  Allergies:  Allergies  Allergen Reactions  . Latex Shortness Of Breath, Swelling and Rash    Past Medical History:  Diagnosis Date  . Anxiety   . PONV (postoperative nausea and vomiting)     Family History  Problem Relation Age of Onset  . Anxiety disorder Sister   . Depression Brother   . Schizophrenia Brother   . Suicidality Brother   . Anxiety disorder Brother   . Alcohol abuse Sister   . Alcohol abuse Brother   . Alcohol abuse Brother     Social History   Socioeconomic History  . Marital status: Divorced    Spouse name: Not  on file  . Number of children: Not on file  . Years of education: Not on file  . Highest education level: Not on file  Occupational History  . Not on file  Tobacco Use  . Smoking status: Never Smoker  . Smokeless tobacco: Never Used  Substance and Sexual Activity  . 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 Drug use: No  . Sexual activity: Not on file  Other Topics Concern  . Not on file  Social History Narrative  . Not on file   Social Determinants of Health   Financial Resource Strain:   . Difficulty of Paying Living Expenses: Not on file  Food Insecurity:   .  Worried About Charity fundraiser in the Last Year: Not on file  . Ran Out of Food in the Last Year: Not on file  Transportation Needs:   . Lack of Transportation (Medical): Not on file  . Lack of Transportation (Non-Medical): Not on file  Physical Activity:   . Days of Exercise per Week: Not on file  . Minutes of Exercise per Session: Not on file  Stress:   . Feeling of Stress : Not on file  Social Connections:   . Frequency of Communication with Friends and Family: Not on file  . Frequency of Social Gatherings with Friends and Family: Not on file  . Attends Religious Services: Not on file  . Active Member of Clubs or Organizations: Not on file  . Attends Archivist Meetings: Not on file  . Marital Status: Not on file  Intimate Partner Violence:   . Fear of Current or Ex-Partner: Not on file  . Emotionally Abused: Not on file  . Physically Abused: Not on file  . Sexually Abused: Not on file    Past Medical History, Surgical history, Social history, and Family history were reviewed and updated as appropriate.   Please see review of systems for further details on the patient's review from today.   Objective:   Physical Exam:  There were no vitals taken for this visit.  Physical Exam Constitutional:      General: She is not in acute distress. Musculoskeletal:        General: No deformity.  Neurological:     Mental Status: She is alert and oriented to person, place, and time.     Coordination: Coordination normal.  Psychiatric:        Attention and Perception: Attention and perception normal. She does not perceive auditory or visual hallucinations.        Mood and Affect: Affect is not labile, blunt, angry or inappropriate.        Speech: Speech normal.        Behavior: Behavior normal.        Thought Content: Thought content normal. Thought content is not paranoid or delusional. Thought content does not include homicidal or suicidal ideation. Thought content does  not include homicidal or suicidal plan.        Cognition and Memory: Cognition and memory normal.        Judgment: Judgment normal.     Comments: Insight intact Mood is appropriate to content.  Affect is congruent.     Lab Review:     Component Value Date/Time   NA 140 05/14/2019 0317   K 4.9 05/14/2019 0317   CL 105 05/14/2019 0317   CO2 28 05/14/2019 0317   GLUCOSE 144 (H) 05/14/2019 6301  BUN 8 05/14/2019 0317   CREATININE 0.63 05/14/2019 0317   CALCIUM 8.9 05/14/2019 0317   GFRNONAA >60 05/14/2019 0317   GFRAA >60 05/14/2019 0317       Component Value Date/Time   WBC 7.6 05/14/2019 0317   RBC 3.80 (L) 05/14/2019 0317   HGB 11.4 (L) 05/14/2019 0317   HCT 36.0 05/14/2019 0317   PLT 203 05/14/2019 0317   MCV 94.7 05/14/2019 0317   MCH 30.0 05/14/2019 0317   MCHC 31.7 05/14/2019 0317   RDW 12.4 05/14/2019 0317    No results found for: POCLITH, LITHIUM   No results found for: PHENYTOIN, PHENOBARB, VALPROATE, CBMZ   .res Assessment: Plan:   Discussed possible treatment options and patient reports that she has been contemplating continuing sertraline 150 mg daily or increasing to 200 mg daily as discussed during previous visits, however she reports that current anxiety seems to be mostly situational and agreed that she has had Significant psychosocial stressors in the last several weeks she reports that she would prefer to continue current medications without changes and will contact office if anxiety worsens or does not improve. Will continue sertraline 150 mg daily for anxiety depression. Continue trazodone 50 to 75 mg at bedtime for insomnia. Continue gabapentin as needed for anxiety and pain. Patient to follow-up in 2 months or sooner if clinically indicated. Patient advised to contact office with any questions, adverse effects, or acute worsening in signs and symptoms.  Debra Rhodes was seen today for follow-up.  Diagnoses and all orders for this visit:  Insomnia,  unspecified type -     traZODone (DESYREL) 50 MG tablet; Take 50-75 mg by mouth at bedtime.  Generalized anxiety disorder -     sertraline (ZOLOFT) 100 MG tablet; Take 1.5 tablets (150 mg total) by mouth daily. -     gabapentin (NEURONTIN) 300 MG capsule; Take 1 capsule (300 mg total) by mouth 3 (three) times daily as needed (pain).  Depression, unspecified depression type -     sertraline (ZOLOFT) 100 MG tablet; Take 1.5 tablets (150 mg total) by mouth daily.  Neuropathy -     gabapentin (NEURONTIN) 300 MG capsule; Take 1 capsule (300 mg total) by mouth 3 (three) times daily as needed (pain).     Please see After Visit Summary for patient specific instructions.  Future Appointments  Date Time Provider [place]  09/27/2020  8:00 AM Thayer Headings, PMHNP [facility] None    No orders of the defined types were placed in this encounter.   -------------------------------

## 2020-09-09 DIAGNOSIS — F1021 Alcohol dependence, in remission: Secondary | ICD-10-CM | POA: Diagnosis not present

## 2020-09-27 ENCOUNTER — Encounter: Payer: Self-pay | Admitting: Psychiatry

## 2020-09-27 ENCOUNTER — Other Ambulatory Visit: Payer: Self-pay | Admitting: Psychiatry

## 2020-09-27 ENCOUNTER — Other Ambulatory Visit: Payer: Self-pay

## 2020-09-27 ENCOUNTER — Ambulatory Visit (INDEPENDENT_AMBULATORY_CARE_PROVIDER_SITE_OTHER): Payer: BC Managed Care – PPO | Admitting: Psychiatry

## 2020-09-27 VITALS — BP 77/46 | HR 74

## 2020-09-27 DIAGNOSIS — F411 Generalized anxiety disorder: Secondary | ICD-10-CM

## 2020-09-27 DIAGNOSIS — F902 Attention-deficit hyperactivity disorder, combined type: Secondary | ICD-10-CM | POA: Diagnosis not present

## 2020-09-27 DIAGNOSIS — F32A Depression, unspecified: Secondary | ICD-10-CM

## 2020-09-27 DIAGNOSIS — G4700 Insomnia, unspecified: Secondary | ICD-10-CM

## 2020-09-27 MED ORDER — TRAZODONE HCL 50 MG PO TABS: ORAL_TABLET | ORAL | 0 refills | Status: DC

## 2020-09-27 MED ORDER — SERTRALINE HCL 100 MG PO TABS: 150.0000 mg | ORAL_TABLET | Freq: Every day | ORAL | 0 refills | Status: DC

## 2020-09-27 MED ORDER — METHYLPHENIDATE HCL ER (OSM) 27 MG PO TBCR: 27.0000 mg | EXTENDED_RELEASE_TABLET | ORAL | 0 refills | Status: DC

## 2020-09-27 MED FILL — SERTRALINE HCL 100 MG TABS: 100 | 90 days supply | Qty: 135 | Fill #0

## 2020-09-27 MED FILL — traZODone HCL 50 MG TABS: 50 | 90 days supply | Qty: 135 | Fill #0

## 2020-09-27 NOTE — Progress Notes (Signed)
Debra Rhodes [id number] [birth year] 59 y.o.  Subjective:   Patient ID:  Debra Rhodes is a 59 y.o. (DOB 04/18/1961) female.  Chief Complaint:  Chief Complaint  Patient presents with  . Follow-up    Anxiety, depression, and insomnia  . ADD    HPI ANALILIA GEDDIS presents to the office today for follow-up of depression, anxiety, and insomnia. "No complaints." She reports that she had a period of depression for 3 days and attributes this to several consecutive nights of poor sleep. She reports that she was watching a series on TV that she was interested in and stayed up later. She reports that mood improved once she had adequate sleep. She reports that sleep is usually adequate except for when she gets in later from work. She reports that her mood has been stable other than the brief period of depression. She reports that her anxiety has been ok. She reports that she was taking Gabapentin prn more when she first started her new job. Has had occasional social anxiety. Denies panic attacks. Occ notices she is holding her breath and feelin some chest tightness in social situations. Does not have anxiety in typical work situations. Has some anticipatory anxiety about upcoming training in [place].  She reports that she has some irritability when she is tired. She reports poor concentration. She reports that she has been dx'd with ADD in the past. She reports that she is easily distracted by her phone.She reports that she will lose her train of thought while talking. She reports that during personal conversations she will think about things she needs to accomplish. Denies SI.   She is now traveling during the day. Enjoying new job. She went hiking over the weekend with a friend.   Denies any recent ETOH use.    Past Psychiatric Medication Trials: Paxil- Has taken it since 60 yo. Has had times where it has been less effective.Had wt gain and sexual side effects at higher doses (40 mg and 60  mg). Prozac- felt more anxious Sertraline Wellbutrin- increased irritability Trazodone-helpful but causes some excessive somnolence, especially at 100 mg dose.  Strattera- Effective but feels "wired" Gabapentin- Takes as needed for anxiety and leg cramps. Buspar-Increased agitation and anxiety   Review of Systems:  Review of Systems  Cardiovascular:       Reports occ palpitations when she has more coffee.  Musculoskeletal: Negative for gait problem.  Neurological: Negative for tremors.  Psychiatric/Behavioral:       Please refer to HPI    Medications: I have reviewed the patient's current medications.  Current Outpatient Medications  Medication Sig Dispense Refill  . Biotin w/ Vitamins C & E (HAIR/SKIN/NAILS PO) Take 1 tablet by mouth daily.    . fexofenadine (ALLEGRA) 180 MG tablet Take 180 mg by mouth daily.     Marland Kitchen gabapentin (NEURONTIN) 300 MG capsule Take 1 capsule (300 mg total) by mouth 3 (three) times daily as needed (pain). 270 capsule 0  . glucosamine-chondroitin 500-400 MG tablet Take 1 tablet by mouth daily.    . Magnesium 500 MG TABS Take 500 mg by mouth daily.    . Omega-3 Fatty Acids (FISH OIL) 1000 MG CAPS Take by mouth.    . sertraline (ZOLOFT) 100 MG tablet Take 1.5 tablets (150 mg total) by mouth daily. 135 tablet 0  . traZODone (DESYREL) 50 MG tablet Take 50-75 mg by mouth at bedtime. 135 tablet 0  . ferrous sulfate (FERROUSUL) 325 (65 FE) MG tablet  Take 1 tablet (325 mg total) by mouth 3 (three) times daily with meals for 14 days. 42 tablet 0  . methylphenidate (CONCERTA) 27 MG PO CR tablet Take 1 tablet (27 mg total) by mouth every morning. 30 tablet 0   No current facility-administered medications for this visit.    Medication Side Effects: None  Allergies:  Allergies  Allergen Reactions  . Latex Shortness Of Breath, Swelling and Rash    Past Medical History:  Diagnosis Date  . Anxiety   . PONV (postoperative nausea and vomiting)     Family  History  Problem Relation Age of Onset  . Anxiety disorder Sister   . Depression Brother   . Schizophrenia Brother   . Suicidality Brother   . Anxiety disorder Brother   . Alcohol abuse Sister   . Alcohol abuse Brother   . Alcohol abuse Brother     Social History   Socioeconomic History  . Marital status: Divorced    Spouse name: Not on file  . Number of children: Not on file  . Years of education: Not on file  . Highest education level: Not on file  Occupational History  . Not on file  Tobacco Use  . Smoking status: Never Smoker  . Smokeless tobacco: Never Used  Substance and Sexual Activity  . 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 Drug use: No  . Sexual activity: Not on file  Other Topics Concern  . Not on file  Social History Narrative  . Not on file   Social Determinants of Health   Financial Resource Strain:   . Difficulty of Paying Living Expenses: Not on file  Food Insecurity:   . Worried About Charity fundraiser in the Last Year: Not on file  . Ran Out of Food in the Last Year: Not on file  Transportation Needs:   . Lack of Transportation (Medical): Not on file  . Lack of Transportation (Non-Medical): Not on file  Physical Activity:   . Days of Exercise per Week: Not on file  . Minutes of Exercise per Session: Not on file  Stress:   . Feeling of Stress : Not on file  Social Connections:   . Frequency of Communication with Friends and Family: Not on file  . Frequency of Social Gatherings with Friends and Family: Not on file  . Attends Religious Services: Not on file  . Active Member of Clubs or Organizations: Not on file  . Attends Archivist Meetings: Not on file  . Marital Status: Not on file  Intimate Partner Violence:   . Fear of Current or Ex-Partner: Not on file  . Emotionally Abused: Not on file  . Physically Abused: Not on file  . Sexually Abused: Not on file    Past Medical History, Surgical history,  Social history, and Family history were reviewed and updated as appropriate.   Please see review of systems for further details on the patient's review from today.   Objective:   Physical Exam:  BP (!) 77/46   Pulse 74   Physical Exam Constitutional:      General: She is not in acute distress. Musculoskeletal:        General: No deformity.  Neurological:     Mental Status: She is alert and oriented to person, place, and time.     Coordination: Coordination normal.  Psychiatric:        Attention and Perception: Attention and  perception normal. She does not perceive auditory or visual hallucinations.        Mood and Affect: Mood normal. Mood is not anxious or depressed. Affect is not labile, blunt, angry or inappropriate.        Speech: Speech normal.        Behavior: Behavior normal.        Thought Content: Thought content normal. Thought content is not paranoid or delusional. Thought content does not include homicidal or suicidal ideation. Thought content does not include homicidal or suicidal plan.        Cognition and Memory: Cognition and memory normal.        Judgment: Judgment normal.     Comments: Insight intact     Lab Review:     Component Value Date/Time   NA 140 05/14/2019 0317   K 4.9 05/14/2019 0317   CL 105 05/14/2019 0317   CO2 28 05/14/2019 0317   GLUCOSE 144 (H) 05/14/2019 0317   BUN 8 05/14/2019 0317   CREATININE 0.63 05/14/2019 0317   CALCIUM 8.9 05/14/2019 0317   GFRNONAA >60 05/14/2019 0317   GFRAA >60 05/14/2019 0317       Component Value Date/Time   WBC 7.6 05/14/2019 0317   RBC 3.80 (L) 05/14/2019 0317   HGB 11.4 (L) 05/14/2019 0317   HCT 36.0 05/14/2019 0317   PLT 203 05/14/2019 0317   MCV 94.7 05/14/2019 0317   MCH 30.0 05/14/2019 0317   MCHC 31.7 05/14/2019 0317   RDW 12.4 05/14/2019 0317    No results found for: POCLITH, LITHIUM   No results found for: PHENYTOIN, PHENOBARB, VALPROATE, CBMZ   .res Assessment: Plan:   Discussed  potential benefits, risks, and side effects of Concerta. Discussed potential benefits, risks, and side effects of stimulants with patient to include increased heart rate, palpitations, insomnia, increased anxiety, increased irritability, or decreased appetite.  Instructed patient to contact office if experiencing any significant tolerability issues. Pt agrees to trial of Concerta. Will start Concerta 27 mg po q am for attention deficit disorder.  Continue Sertraline for depression and anxiety.  Continue Trazodone 50-75 mg po QHS for insomnia.  Continue Gabapentin 300 mg po TID prn anxiety or insomnia.  Pt to follow-up in 4 weeks or sooner if clinically indicated.  Patient advised to contact office with any questions, adverse effects, or acute worsening in signs and symptoms.  Zniyah was seen today for follow-up and add.  Diagnoses and all orders for this visit:  Attention deficit hyperactivity disorder (ADHD), combined type -     methylphenidate (CONCERTA) 27 MG PO CR tablet; Take 1 tablet (27 mg total) by mouth every morning.  Generalized anxiety disorder -     sertraline (ZOLOFT) 100 MG tablet; Take 1.5 tablets (150 mg total) by mouth daily.  Depression, unspecified depression type -     sertraline (ZOLOFT) 100 MG tablet; Take 1.5 tablets (150 mg total) by mouth daily.  Insomnia, unspecified type -     traZODone (DESYREL) 50 MG tablet; Take 50-75 mg by mouth at bedtime.     Please see After Visit Summary for patient specific instructions.  Future Appointments  Date Time Provider [facility]  11/01/2020 10:30 AM Thayer Headings, PMHNP [facility] None    No orders of the defined types were placed in this encounter.   -------------------------------

## 2020-09-28 ENCOUNTER — Telehealth: Payer: Self-pay | Admitting: Psychiatry

## 2020-09-28 NOTE — Telephone Encounter (Signed)
Pt called due to insurance change all Rx should go to [facility] [phone]. Please do not send any Rx to [facility] any longer. Also, Pt asking to send  Concerta 27 mg  to [facility] so insurance will pay and canc Rx @ [place] did pick up Zoloft & Trazodone @ [facility] already.

## 2020-10-04 ENCOUNTER — Telehealth: Payer: Self-pay | Admitting: Psychiatry

## 2020-10-04 ENCOUNTER — Other Ambulatory Visit: Payer: Self-pay

## 2020-10-04 DIAGNOSIS — F32A Depression, unspecified: Secondary | ICD-10-CM

## 2020-10-04 DIAGNOSIS — F902 Attention-deficit hyperactivity disorder, combined type: Secondary | ICD-10-CM

## 2020-10-04 DIAGNOSIS — F411 Generalized anxiety disorder: Secondary | ICD-10-CM

## 2020-10-04 DIAGNOSIS — G4700 Insomnia, unspecified: Secondary | ICD-10-CM

## 2020-10-04 MED ORDER — TRAZODONE HCL 50 MG PO TABS: ORAL_TABLET | ORAL | 0 refills | Status: DC

## 2020-10-04 MED ORDER — METHYLPHENIDATE HCL ER (OSM) 27 MG PO TBCR: 27.0000 mg | EXTENDED_RELEASE_TABLET | ORAL | 0 refills | Status: DC

## 2020-10-04 MED ORDER — SERTRALINE HCL 100 MG PO TABS: 150.0000 mg | ORAL_TABLET | Freq: Every day | ORAL | 0 refills | Status: DC

## 2020-10-05 DIAGNOSIS — F1020 Alcohol dependence, uncomplicated: Secondary | ICD-10-CM | POA: Diagnosis not present

## 2020-10-05 NOTE — Telephone Encounter (Signed)
Error

## 2020-11-01 ENCOUNTER — Other Ambulatory Visit: Payer: Self-pay

## 2020-11-01 ENCOUNTER — Encounter: Payer: Self-pay | Admitting: Psychiatry

## 2020-11-01 ENCOUNTER — Ambulatory Visit (INDEPENDENT_AMBULATORY_CARE_PROVIDER_SITE_OTHER): Payer: BC Managed Care – PPO | Admitting: Psychiatry

## 2020-11-01 DIAGNOSIS — F411 Generalized anxiety disorder: Secondary | ICD-10-CM

## 2020-11-01 DIAGNOSIS — G4700 Insomnia, unspecified: Secondary | ICD-10-CM | POA: Diagnosis not present

## 2020-11-01 DIAGNOSIS — G629 Polyneuropathy, unspecified: Secondary | ICD-10-CM

## 2020-11-01 DIAGNOSIS — F32A Depression, unspecified: Secondary | ICD-10-CM | POA: Diagnosis not present

## 2020-11-01 MED ORDER — SERTRALINE HCL 100 MG PO TABS: 200.0000 mg | ORAL_TABLET | Freq: Every day | ORAL | 0 refills | Status: DC

## 2020-11-01 MED ORDER — TRAZODONE HCL 50 MG PO TABS: ORAL_TABLET | ORAL | 0 refills | Status: DC

## 2020-11-01 MED ORDER — GABAPENTIN 300 MG PO CAPS: 300.0000 mg | ORAL_CAPSULE | Freq: Three times a day (TID) | ORAL | 0 refills | Status: DC | PRN

## 2020-11-01 NOTE — Progress Notes (Signed)
Debra Rhodes [id number] 1961-03-27 59 y.o.  Subjective:   Patient ID:  Debra Rhodes is a 59 y.o. (DOB 29-Jan-1961) female.  Chief Complaint:  Chief Complaint  Patient presents with  . Anxiety  . Depression    HPI TANASIA BUDZINSKI presents to the office today for follow-up of depression and anxiety. She reports, "I'm still having some depression." Waking up in the middle of the night a few times a week with a feeling of hopelessness.  She has She reports occasional sadness for "no reason." She reports, "I'm happy with my life right now." Denies any panic s/s. She reports that she was using Gabapentin prn up to TID during recent training with some improvement. Has been having increased worry and anxious thoughts. Energy may be slightly decreased. She reports that Concerta 27 mg has been partially effective. She had some jitteriness intially. She then took 1/2 tab of Concerta and this was better tolerated. Appetite has been increased and reports increased emotional eating. Has not been exercising as much and has been going to bed earlier. She reports that she has had some occasional "what's the point?" thoughts. She denies SI.   Taking Vitamin D.    Had training in [place] again for a week. She reports that there were increased triggers and temptations to drinking. She denies any recent ETOH use. Has to go for training again in late January.    Past Psychiatric Medication Trials: Paxil- Has taken it since 59 yo. Has had times where it has been less effective.Had wt gain and sexual side effects at higher doses (40 mg and 60 mg). Prozac- felt more anxious Sertraline Wellbutrin- increased irritability Trazodone-helpful but causes some excessive somnolence, especially at 100 mg dose.  Strattera- Effective but feels "wired" Gabapentin- Takes as needed for anxiety and leg cramps. Buspar-Increased agitation and anxiety   Review of Systems:  Review of Systems  Musculoskeletal: Negative  for gait problem.  Neurological: Positive for tremors.       She reports that her hands will occasionally shake due to possible med side effects and anxiety,  Psychiatric/Behavioral:       Please refer to HPI    Medications: I have reviewed the patient's current medications.  Current Outpatient Medications  Medication Sig Dispense Refill  . Biotin w/ Vitamins C & E (HAIR/SKIN/NAILS PO) Take 1 tablet by mouth daily.    . fexofenadine (ALLEGRA) 180 MG tablet Take 180 mg by mouth daily.     Marland Kitchen glucosamine-chondroitin 500-400 MG tablet Take 1 tablet by mouth daily.    . Magnesium 500 MG TABS Take 500 mg by mouth daily.    . methylphenidate (CONCERTA) 27 MG PO CR tablet Take 1 tablet (27 mg total) by mouth every morning. 30 tablet 0  . ferrous sulfate (FERROUSUL) 325 (65 FE) MG tablet Take 1 tablet (325 mg total) by mouth 3 (three) times daily with meals for 14 days. 42 tablet 0  . gabapentin (NEURONTIN) 300 MG capsule Take 1 capsule (300 mg total) by mouth 3 (three) times daily as needed. 270 capsule 0  . Omega-3 Fatty Acids (FISH OIL) 1000 MG CAPS Take by mouth. (Patient not taking: Reported on 11/01/2020)    . sertraline (ZOLOFT) 100 MG tablet Take 2 tablets (200 mg total) by mouth daily. 180 tablet 0  . traZODone (DESYREL) 50 MG tablet Take 50-75 mg by mouth at bedtime. 135 tablet 0   No current facility-administered medications for this visit.    Medication  Side Effects: None  Allergies:  Allergies  Allergen Reactions  . Latex Shortness Of Breath, Swelling and Rash    Past Medical History:  Diagnosis Date  . Anxiety   . PONV (postoperative nausea and vomiting)     Family History  Problem Relation Age of Onset  . Anxiety disorder Sister   . Depression Brother   . Schizophrenia Brother   . Suicidality Brother   . Anxiety disorder Brother   . Alcohol abuse Sister   . Alcohol abuse Brother   . Alcohol abuse Brother     Social History   Socioeconomic History  . Marital  status: Divorced    Spouse name: Not on file  . Number of children: Not on file  . Years of education: Not on file  . Highest education level: Not on file  Occupational History  . Not on file  Tobacco Use  . Smoking status: Never Smoker  . Smokeless tobacco: Never Used  Substance and Sexual Activity  . 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 Drug use: No  . Sexual activity: Not on file  Other Topics Concern  . Not on file  Social History Narrative  . Not on file   Social Determinants of Health   Financial Resource Strain: Not on file  Food Insecurity: Not on file  Transportation Needs: Not on file  Physical Activity: Not on file  Stress: Not on file  Social Connections: Not on file  Intimate Partner Violence: Not on file    Past Medical History, Surgical history, Social history, and Family history were reviewed and updated as appropriate.   Please see review of systems for further details on the patient's review from today.   Objective:   Physical Exam:  BP 117/77   Pulse 60   Physical Exam Constitutional:      General: She is not in acute distress. Musculoskeletal:        General: No deformity.  Neurological:     Mental Status: She is alert and oriented to person, place, and time.     Coordination: Coordination normal.  Psychiatric:        Attention and Perception: Attention and perception normal. She does not perceive auditory or visual hallucinations.        Mood and Affect: Mood is anxious and depressed. Affect is not labile, blunt, angry or inappropriate.        Speech: Speech normal.        Behavior: Behavior normal.        Thought Content: Thought content normal. Thought content is not paranoid or delusional. Thought content does not include homicidal or suicidal ideation. Thought content does not include homicidal or suicidal plan.        Cognition and Memory: Cognition and memory normal.        Judgment: Judgment normal.      Comments: Insight intact     Lab Review:     Component Value Date/Time   NA 140 05/14/2019 0317   K 4.9 05/14/2019 0317   CL 105 05/14/2019 0317   CO2 28 05/14/2019 0317   GLUCOSE 144 (H) 05/14/2019 0317   BUN 8 05/14/2019 0317   CREATININE 0.63 05/14/2019 0317   CALCIUM 8.9 05/14/2019 0317   GFRNONAA >60 05/14/2019 0317   GFRAA >60 05/14/2019 0317       Component Value Date/Time   WBC 7.6 05/14/2019 0317   RBC 3.80 (L) 05/14/2019 8144  HGB 11.4 (L) 05/14/2019 0317   HCT 36.0 05/14/2019 0317   PLT 203 05/14/2019 0317   MCV 94.7 05/14/2019 0317   MCH 30.0 05/14/2019 0317   MCHC 31.7 05/14/2019 0317   RDW 12.4 05/14/2019 0317    No results found for: POCLITH, LITHIUM   No results found for: PHENYTOIN, PHENOBARB, VALPROATE, CBMZ   .res Assessment: Plan:   Will try Concerta 27 mg. Discussed switching to 18 mg po q am if needed. Pt to call with update in a week to discuss if she is now able to tolerate 27 mg po q am and would like additional script submitted or if Concerta needs to be decreased to 18 mg po qd.  Discussed potential benefits, risks, and side effects of increasing Sertraline. Will increase to Sertraline to 200 mg po qd. Discussed that Sertraline could be changed back to Paxil if no improvement with increase in Sertraline since pt asks about switching back to Paxil.  Continue Gabapentin 300 mg po TID prn anxiety.  Continue Trazodone 50-75 mg po QHS prn insomnia. Pt to follow-up in 6-7 weeks or sooner if clinically indicated.  Patient advised to contact office with any questions, adverse effects, or acute worsening in signs and symptoms.   Kuuipo was seen today for anxiety and depression.  Diagnoses and all orders for this visit:  Generalized anxiety disorder -     sertraline (ZOLOFT) 100 MG tablet; Take 2 tablets (200 mg total) by mouth daily. -     gabapentin (NEURONTIN) 300 MG capsule; Take 1 capsule (300 mg total) by mouth 3 (three) times daily as  needed.  Depression, unspecified depression type -     sertraline (ZOLOFT) 100 MG tablet; Take 2 tablets (200 mg total) by mouth daily.  Insomnia, unspecified type -     traZODone (DESYREL) 50 MG tablet; Take 50-75 mg by mouth at bedtime.  Neuropathy -     gabapentin (NEURONTIN) 300 MG capsule; Take 1 capsule (300 mg total) by mouth 3 (three) times daily as needed.     Please see After Visit Summary for patient specific instructions.  Future Appointments  Date Time Provider [place]  12/23/2020  9:00 AM Thayer Headings, PMHNP [facility] None    No orders of the defined types were placed in this encounter.   -------------------------------

## 2020-11-22 ENCOUNTER — Telehealth: Payer: Self-pay | Admitting: Psychiatry

## 2020-11-22 DIAGNOSIS — F32A Depression, unspecified: Secondary | ICD-10-CM

## 2020-11-22 DIAGNOSIS — F411 Generalized anxiety disorder: Secondary | ICD-10-CM

## 2020-11-22 NOTE — Telephone Encounter (Signed)
Debra Rhodes called and LM regarding her meds. She has been on Zoloft and her depression seems to be getting worse. She wants to go back on the Paxil. Can she do this?

## 2020-11-23 MED ORDER — PAROXETINE HCL 20 MG PO TABS: ORAL_TABLET | ORAL | 1 refills | Status: DC

## 2020-11-23 MED ORDER — SERTRALINE HCL 100 MG PO TABS: ORAL_TABLET | ORAL | 0 refills | Status: DC

## 2020-11-23 NOTE — Telephone Encounter (Signed)
Called and left pt detailed message about switching back to paxil. Will start Paxil 20 mg 1/2 tablet daily for 10 days, then increase to 1 tablet daily. Advised to decrease Sertraline by 50 mg every 5 days, ie. 150 mg po qd for 5 days, then 100 mg po qd for 5 days, then 50 mg po qd for 5 days, then stop. Advised to call office with any discontinuation s/s or questions.

## 2020-11-24 ENCOUNTER — Other Ambulatory Visit: Payer: BC Managed Care – PPO

## 2020-11-24 DIAGNOSIS — Z20822 Contact with and (suspected) exposure to covid-19: Secondary | ICD-10-CM | POA: Diagnosis not present

## 2020-11-26 LAB — NOVEL CORONAVIRUS, NAA: SARS-CoV-2, NAA: NOT DETECTED

## 2020-11-26 LAB — SARS-COV-2, NAA 2 DAY TAT

## 2020-12-17 DIAGNOSIS — E611 Iron deficiency: Secondary | ICD-10-CM | POA: Diagnosis not present

## 2020-12-17 DIAGNOSIS — E538 Deficiency of other specified B group vitamins: Secondary | ICD-10-CM | POA: Diagnosis not present

## 2020-12-17 DIAGNOSIS — R5382 Chronic fatigue, unspecified: Secondary | ICD-10-CM | POA: Diagnosis not present

## 2020-12-17 DIAGNOSIS — R0602 Shortness of breath: Secondary | ICD-10-CM | POA: Diagnosis not present

## 2020-12-23 ENCOUNTER — Ambulatory Visit (INDEPENDENT_AMBULATORY_CARE_PROVIDER_SITE_OTHER): Payer: BC Managed Care – PPO | Admitting: Psychiatry

## 2020-12-23 ENCOUNTER — Other Ambulatory Visit: Payer: Self-pay

## 2020-12-23 ENCOUNTER — Encounter: Payer: Self-pay | Admitting: Psychiatry

## 2020-12-23 DIAGNOSIS — G4700 Insomnia, unspecified: Secondary | ICD-10-CM

## 2020-12-23 DIAGNOSIS — F411 Generalized anxiety disorder: Secondary | ICD-10-CM | POA: Diagnosis not present

## 2020-12-23 DIAGNOSIS — F32A Depression, unspecified: Secondary | ICD-10-CM

## 2020-12-23 DIAGNOSIS — F902 Attention-deficit hyperactivity disorder, combined type: Secondary | ICD-10-CM

## 2020-12-23 MED ORDER — PAROXETINE HCL 40 MG PO TABS: 40.0000 mg | ORAL_TABLET | Freq: Every day | ORAL | 2 refills | Status: DC

## 2020-12-23 MED ORDER — METHYLPHENIDATE HCL ER 18 MG PO TB24: 18.0000 mg | ORAL_TABLET | Freq: Every day | ORAL | 0 refills | Status: DC

## 2020-12-23 MED ORDER — TRAZODONE HCL 50 MG PO TABS: ORAL_TABLET | ORAL | 0 refills | Status: DC

## 2020-12-23 NOTE — Progress Notes (Signed)
Debra Rhodes [id number] [birth year] 60 y.o.  Subjective:   Patient ID:  Debra Rhodes is a 60 y.o. (DOB 07-31-1961) female.  Chief Complaint:  Chief Complaint  Patient presents with  . Depression  . Anxiety    HPI [name] presents to the office today for follow-up of anxiety and depression. She reports that the "Paxil has made a big difference." She reports that she was having suicidal thoughts prior to med change. She reports that she tolerated cross-titration. She reports that she increased her dose of Paxil to 30 mg daily about 3 weeks ago. She reports "I still feel kind of blah." She reports that her anxiety has been "more than usual." Has not had full blown panic attacks. Has had some shortness of breath with anxiety. Has had worry and anxious thoughts. Reports that she was sleeping 10-12 hours when she was more depressed and reports that this is improving. Sleeping about 9 hours a night. Energy and motivation are low, but improved compared to a month ago. Appetite has been ok. Has noticed some slight improvement in concentration. Denies anhedonia. She reports passive death wishes. Denies SI.   She reports that she travels more with work than expected. Reports some recent changes at work.   She reports drinking ETOH for about 3 days around [place] Years. She reports that she has some thoughts about drinking. Remains active in [place].   Plans to start EAP.  Past Psychiatric Medication Trials: Paxil- Has taken it since 60 yo. Has had times where it has been less effective.Had wt gain and sexual side effects at higher doses (40 mg and 60 mg). Prozac- felt more anxious Sertraline Wellbutrin- increased irritability Trazodone-helpful but causes some excessive somnolence, especially at 100 mg dose.  Strattera- Effective but feels "wired" Gabapentin- Takes as needed for anxiety and leg cramps. Buspar-Increased agitation and anxiety Concerta- Had increased anxiety  Review of Systems:   Review of Systems  Constitutional: Positive for fatigue.  Musculoskeletal: Negative for gait problem.  Allergic/Immunologic: Positive for environmental allergies.  Neurological: Negative for tremors.  Hematological:       Reports iron levels were normal  Psychiatric/Behavioral:       Please refer to HPI  Has upcoming physical exam in March  Medications: I have reviewed the patient's current medications.  Current Outpatient Medications  Medication Sig Dispense Refill  . Biotin w/ Vitamins C & E (HAIR/SKIN/NAILS PO) Take 1 tablet by mouth daily.    . fexofenadine (ALLEGRA) 180 MG tablet Take 180 mg by mouth daily.     Marland Kitchen gabapentin (NEURONTIN) 300 MG capsule Take 1 capsule (300 mg total) by mouth 3 (three) times daily as needed. 270 capsule 0  . glucosamine-chondroitin 500-400 MG tablet Take 1 tablet by mouth daily.    . methylphenidate 18 MG PO CR tablet Take 1 tablet (18 mg total) by mouth daily. 30 tablet 0  . ferrous sulfate (FERROUSUL) 325 (65 FE) MG tablet Take 1 tablet (325 mg total) by mouth 3 (three) times daily with meals for 14 days. 42 tablet 0  . Omega-3 Fatty Acids (FISH OIL) 1000 MG CAPS Take by mouth. (Patient not taking: Reported on 11/01/2020)    . PARoxetine (PAXIL) 40 MG tablet Take 1 tablet (40 mg total) by mouth daily. 30 tablet 2  . traZODone (DESYREL) 50 MG tablet Take 50-75 mg by mouth at bedtime. 135 tablet 0   No current facility-administered medications for this visit.    Medication Side Effects:  Other: Some sexual side effects  Allergies:  Allergies  Allergen Reactions  . Latex Shortness Of Breath, Swelling and Rash    Past Medical History:  Diagnosis Date  . Anxiety   . PONV (postoperative nausea and vomiting)     Family History  Problem Relation Age of Onset  . Anxiety disorder Sister   . Depression Brother   . Schizophrenia Brother   . Suicidality Brother   . Anxiety disorder Brother   . Alcohol abuse Sister   . Alcohol abuse Brother    . Alcohol abuse Brother     Social History   Socioeconomic History  . Marital status: Divorced    Spouse name: Not on file  . Number of children: Not on file  . Years of education: Not on file  . Highest education level: Not on file  Occupational History  . Not on file  Tobacco Use  . Smoking status: Never Smoker  . Smokeless tobacco: Never Used  Substance and Sexual Activity  . 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 Drug use: No  . Sexual activity: Not on file  Other Topics Concern  . Not on file  Social History Narrative  . Not on file   Social Determinants of Health   Financial Resource Strain: Not on file  Food Insecurity: Not on file  Transportation Needs: Not on file  Physical Activity: Not on file  Stress: Not on file  Social Connections: Not on file  Intimate Partner Violence: Not on file    Past Medical History, Surgical history, Social history, and Family history were reviewed and updated as appropriate.   Please see review of systems for further details on the patient's review from today.   Objective:   Physical Exam:  There were no vitals taken for this visit.  Physical Exam Constitutional:      General: She is not in acute distress. Musculoskeletal:        General: No deformity.  Neurological:     Mental Status: She is alert and oriented to person, place, and time.     Coordination: Coordination normal.  Psychiatric:        Attention and Perception: Attention and perception normal. She does not perceive auditory or visual hallucinations.        Mood and Affect: Mood is anxious and depressed. Affect is not labile, blunt, angry or inappropriate.        Speech: Speech normal.        Behavior: Behavior normal.        Thought Content: Thought content normal. Thought content is not paranoid or delusional. Thought content does not include homicidal or suicidal ideation. Thought content does not include homicidal or suicidal plan.         Cognition and Memory: Cognition and memory normal.        Judgment: Judgment normal.     Comments: Insight intact     Lab Review:     Component Value Date/Time   NA 140 05/14/2019 0317   K 4.9 05/14/2019 0317   CL 105 05/14/2019 0317   CO2 28 05/14/2019 0317   GLUCOSE 144 (H) 05/14/2019 0317   BUN 8 05/14/2019 0317   CREATININE 0.63 05/14/2019 0317   CALCIUM 8.9 05/14/2019 0317   GFRNONAA >60 05/14/2019 0317   GFRAA >60 05/14/2019 0317       Component Value Date/Time   WBC 7.6 05/14/2019 0317   RBC 3.80 (L) 05/14/2019  0317   HGB 11.4 (L) 05/14/2019 0317   HCT 36.0 05/14/2019 0317   PLT 203 05/14/2019 0317   MCV 94.7 05/14/2019 0317   MCH 30.0 05/14/2019 0317   MCHC 31.7 05/14/2019 0317   RDW 12.4 05/14/2019 0317    No results found for: POCLITH, LITHIUM   No results found for: PHENYTOIN, PHENOBARB, VALPROATE, CBMZ   .res Assessment: Plan:     Discussed potential benefits, risks, and side effects of increasing Paxil to 40 mg daily.  Patient agrees to increase in Paxil to 40 mg to improve mood and anxiety. Continue trazodone 50 to 75 mg daily for insomnia. Discussed that Concerta is available in an 18 mg dose and this may have less risk of side effects compared to 27 mg dose.  Patient agrees in dose change. Patient to follow-up in 2 months or sooner if clinically indicated. Patient advised to contact office with any questions, adverse effects, or acute worsening in signs and symptoms.   Soni was seen today for depression and anxiety.  Diagnoses and all orders for this visit:  Attention deficit hyperactivity disorder (ADHD), combined type -     methylphenidate 18 MG PO CR tablet; Take 1 tablet (18 mg total) by mouth daily.  Generalized anxiety disorder -     PARoxetine (PAXIL) 40 MG tablet; Take 1 tablet (40 mg total) by mouth daily.  Depression, unspecified depression type -     PARoxetine (PAXIL) 40 MG tablet; Take 1 tablet (40 mg total) by mouth  daily.  Insomnia, unspecified type -     traZODone (DESYREL) 50 MG tablet; Take 50-75 mg by mouth at bedtime.     Please see After Visit Summary for patient specific instructions.  Future Appointments  Date Time Provider [place]  02/01/2021  9:40 AM Vivi Barrack, MD [facility]  02/21/2021  9:00 AM Thayer Headings, PMHNP [facility] None    No orders of the defined types were placed in this encounter.   -------------------------------

## 2021-01-17 ENCOUNTER — Encounter (HOSPITAL_COMMUNITY): Payer: Self-pay

## 2021-01-17 ENCOUNTER — Emergency Department (HOSPITAL_COMMUNITY)
Admission: EM | Admit: 2021-01-17 | Discharge: 2021-01-18 | Disposition: A | Discharge status: Other Acute Inpatient Hospital | Payer: BC Managed Care – PPO | Source: Home / Self Care | Attending: Emergency Medicine | Admitting: Emergency Medicine

## 2021-01-17 ENCOUNTER — Other Ambulatory Visit: Payer: Self-pay

## 2021-01-17 DIAGNOSIS — Z79899 Other long term (current) drug therapy: Secondary | ICD-10-CM | POA: Diagnosis not present

## 2021-01-17 DIAGNOSIS — F1010 Alcohol abuse, uncomplicated: Secondary | ICD-10-CM | POA: Diagnosis not present

## 2021-01-17 DIAGNOSIS — F10129 Alcohol abuse with intoxication, unspecified: Secondary | ICD-10-CM | POA: Insufficient documentation

## 2021-01-17 DIAGNOSIS — Z20822 Contact with and (suspected) exposure to covid-19: Secondary | ICD-10-CM | POA: Insufficient documentation

## 2021-01-17 DIAGNOSIS — Z9152 Personal history of nonsuicidal self-harm: Secondary | ICD-10-CM | POA: Diagnosis not present

## 2021-01-17 DIAGNOSIS — Z91040 Latex allergy status: Secondary | ICD-10-CM | POA: Diagnosis not present

## 2021-01-17 DIAGNOSIS — R45851 Suicidal ideations: Secondary | ICD-10-CM

## 2021-01-17 DIAGNOSIS — F419 Anxiety disorder, unspecified: Secondary | ICD-10-CM | POA: Diagnosis not present

## 2021-01-17 DIAGNOSIS — F32A Depression, unspecified: Secondary | ICD-10-CM | POA: Diagnosis not present

## 2021-01-17 DIAGNOSIS — Z96651 Presence of right artificial knee joint: Secondary | ICD-10-CM | POA: Insufficient documentation

## 2021-01-17 DIAGNOSIS — F988 Other specified behavioral and emotional disorders with onset usually occurring in childhood and adolescence: Secondary | ICD-10-CM | POA: Diagnosis not present

## 2021-01-17 DIAGNOSIS — F332 Major depressive disorder, recurrent severe without psychotic features: Secondary | ICD-10-CM | POA: Insufficient documentation

## 2021-01-17 DIAGNOSIS — Z811 Family history of alcohol abuse and dependence: Secondary | ICD-10-CM | POA: Diagnosis not present

## 2021-01-17 DIAGNOSIS — Z818 Family history of other mental and behavioral disorders: Secondary | ICD-10-CM | POA: Diagnosis not present

## 2021-01-17 HISTORY — DX: Depression, unspecified: F32.A

## 2021-01-17 LAB — COMPREHENSIVE METABOLIC PANEL
ALT: 25 U/L (ref 0–44)
AST: 40 U/L (ref 15–41)
Albumin: 4.5 g/dL (ref 3.5–5.0)
Alkaline Phosphatase: 48 U/L (ref 38–126)
Anion gap: 19 — ABNORMAL HIGH (ref 5–15)
BUN: 7 mg/dL (ref 6–20)
CO2: 23 mmol/L (ref 22–32)
Calcium: 9.3 mg/dL (ref 8.9–10.3)
Chloride: 102 mmol/L (ref 98–111)
Creatinine, Ser: 0.67 mg/dL (ref 0.44–1.00)
GFR, Estimated: >60 mL/min (ref >60)
Glucose, Bld: 98 mg/dL (ref 70–99)
Potassium: 3.8 mmol/L (ref 3.5–5.1)
Sodium: 144 mmol/L (ref 135–145)
Total Bilirubin: 0.5 mg/dL (ref 0.3–1.2)
Total Protein: 7.5 g/dL (ref 6.5–8.1)

## 2021-01-17 LAB — ACETAMINOPHEN LEVEL: Acetaminophen (Tylenol), Serum: <10 ug/mL — ABNORMAL LOW (ref 10–30)

## 2021-01-17 LAB — SALICYLATE LEVEL: Salicylate Lvl: <7 mg/dL — ABNORMAL LOW (ref 7.0–30.0)

## 2021-01-17 LAB — ETHANOL: Alcohol, Ethyl (B): 267 mg/dL — ABNORMAL HIGH (ref <10)

## 2021-01-17 LAB — RAPID URINE DRUG SCREEN, HOSP PERFORMED
Amphetamines: NOT DETECTED
Barbiturates: NOT DETECTED
Benzodiazepines: NOT DETECTED
Cocaine: NOT DETECTED
Opiates: NOT DETECTED
Tetrahydrocannabinol: NOT DETECTED

## 2021-01-17 LAB — CBC
HCT: 42.4 % (ref 36.0–46.0)
Hemoglobin: 14.1 g/dL (ref 12.0–15.0)
MCH: 31.1 pg (ref 26.0–34.0)
MCHC: 33.3 g/dL (ref 30.0–36.0)
MCV: 93.6 fL (ref 80.0–100.0)
Platelets: 216 10*3/uL (ref 150–400)
RBC: 4.53 MIL/uL (ref 3.87–5.11)
RDW: 13.2 % (ref 11.5–15.5)
WBC: 3.9 10*3/uL — ABNORMAL LOW (ref 4.0–10.5)
nRBC: 0 % (ref 0.0–0.2)

## 2021-01-17 LAB — I-STAT BETA HCG BLOOD, ED (MC, WL, AP ONLY): I-stat hCG, quantitative: 7.3 m[IU]/mL — ABNORMAL HIGH (ref <5)

## 2021-01-17 MED ORDER — IBUPROFEN 200 MG PO TABS
600.0000 mg | ORAL_TABLET | Freq: Once | ORAL | Status: AC
  Administered 2021-01-17: 600 mg via ORAL
  Filled 2021-01-17: qty 3

## 2021-01-17 MED ORDER — ACETAMINOPHEN 325 MG PO TABS
650.0000 mg | ORAL_TABLET | ORAL | Status: DC | PRN
  Administered 2021-01-18: 650 mg via ORAL
  Filled 2021-01-17: qty 2

## 2021-01-17 MED ORDER — LORAZEPAM 2 MG/ML IJ SOLN: 0.0000 mg | Freq: Four times a day (QID) | INTRAMUSCULAR | Status: DC

## 2021-01-17 MED ORDER — LORAZEPAM 1 MG PO TABS
1.0000 mg | ORAL_TABLET | Freq: Once | ORAL | Status: AC
  Administered 2021-01-17: 1 mg via ORAL
  Filled 2021-01-17: qty 1

## 2021-01-17 MED ORDER — LORAZEPAM 1 MG PO TABS
0.0000 mg | ORAL_TABLET | Freq: Four times a day (QID) | ORAL | Status: DC
  Administered 2021-01-18: 1 mg via ORAL
  Filled 2021-01-17: qty 1

## 2021-01-17 MED ORDER — LORAZEPAM 2 MG/ML IJ SOLN: 0.0000 mg | Freq: Two times a day (BID) | INTRAMUSCULAR | Status: DC

## 2021-01-17 MED ORDER — ONDANSETRON HCL 4 MG PO TABS: 4.0000 mg | ORAL_TABLET | Freq: Three times a day (TID) | ORAL | Status: DC | PRN

## 2021-01-17 MED ORDER — LORAZEPAM 1 MG PO TABS: 0.0000 mg | ORAL_TABLET | Freq: Two times a day (BID) | ORAL | Status: DC

## 2021-01-17 MED ORDER — THIAMINE HCL 100 MG/ML IJ SOLN: 100.0000 mg | Freq: Every day | INTRAMUSCULAR | Status: DC

## 2021-01-17 MED ORDER — THIAMINE HCL 100 MG PO TABS
100.0000 mg | ORAL_TABLET | Freq: Every day | ORAL | Status: DC
  Administered 2021-01-18: 100 mg via ORAL
  Filled 2021-01-17: qty 1

## 2021-01-17 NOTE — ED Notes (Signed)
Pt reports daily consumption of ETOH - 3 bottles of wine per day. Last drink 11 am this morning.

## 2021-01-17 NOTE — ED Notes (Signed)
Visitor provided rules and asked to leave based off policy and procedure. Patient states well why does he have to leave he is my husband. Show patient where it says 107min visits. Husband understands.

## 2021-01-17 NOTE — ED Notes (Signed)
Patient's belongings are in two bags and are in the cabinet above computer for 16-22 nurse.

## 2021-01-17 NOTE — ED Notes (Signed)
Patient husband was told he could not come back. Patient is resting. Husband came back anyway. As charge nurse told him about visiting hours and visiting patient asked the charge nurse name. Charge nurse showed visitor her badge and visitor started pulling on the badge.

## 2021-01-17 NOTE — ED Notes (Signed)
Patient requesting motrin for headache. MD notified.

## 2021-01-17 NOTE — ED Notes (Signed)
Husband here to visit patient

## 2021-01-17 NOTE — ED Notes (Signed)
Husband has left

## 2021-01-17 NOTE — ED Provider Notes (Signed)
[facility] DEPT Provider Note   CSN: [id number] Arrival date & time: 01/17/21  1641     History Chief Complaint  Patient presents with  . Suicidal    Debra Rhodes is a 60 y.o. female.  Presents to ER with concern for suicidal thoughts.  Reports she has a long history of depression.  More recently has had increased depression, some thoughts of suicide.  States that she has thought about cutting herself and tried to cut her wrists today but did not make any significant damage.  Has been prescribed Paxil but has not been taking this recently.  Has a psychiatry PA locally.  Also reports that she struggles from alcohol abuse.  Last drink was yesterday around 11 AM.  Denies access to firearms at home.  Is interested in voluntarily receiving psychiatric care.  HPI     Past Medical History:  Diagnosis Date  . Anxiety   . Depression   . PONV (postoperative nausea and vomiting)     Patient Active Problem List   Diagnosis Date Noted  . Overweight (BMI 25.0-29.9) 05/14/2019  . S/P right TKA 05/13/2019  . Synovial cyst of knee 02/10/2019  . Pain in right knee 02/04/2019    Past Surgical History:  Procedure Laterality Date  . AUGMENTATION MAMMAPLASTY Bilateral   . BACK SURGERY    . CESAREAN SECTION    . FRACTURE SURGERY    . 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   Procedure: TOTAL KNEE ARTHROPLASTY;  Surgeon: Paralee Cancel, MD;  Location: [facility];  Service: Orthopedics;  Laterality: Right;  70 mins     OB History   No obstetric history on file.     Family History  Problem Relation Age of Onset  . Anxiety disorder Sister   . Depression Brother   . Schizophrenia Brother   . Suicidality Brother   . Anxiety disorder Brother   . Alcohol abuse Sister   . Alcohol abuse Brother   . Alcohol abuse Brother     Social History   Tobacco Use  . Smoking status: Never Smoker  . Smokeless tobacco: Never Used  Substance Use Topics  . Alcohol use:  Yes    Comment: binge drinking- was in recovery for past 4 years  . Drug use: No    Home Medications Prior to Admission medications   Medication Sig Start Date End Date Taking? Authorizing Provider  Biotin w/ Vitamins C & E (HAIR/SKIN/NAILS PO) Take 1 tablet by mouth daily.    [provider]  ferrous sulfate (FERROUSUL) 325 (65 FE) MG tablet Take 1 tablet (325 mg total) by mouth 3 (three) times daily with meals for 14 days. 05/14/19 05/28/19  Danae Orleans, PA-C  fexofenadine (ALLEGRA) 180 MG tablet Take 180 mg by mouth daily.     [provider]  gabapentin (NEURONTIN) 300 MG capsule Take 1 capsule (300 mg total) by mouth 3 (three) times daily as needed. 11/01/20 01/30/21  Thayer Headings, PMHNP  glucosamine-chondroitin 500-400 MG tablet Take 1 tablet by mouth daily.    [provider]  methylphenidate 18 MG PO CR tablet Take 1 tablet (18 mg total) by mouth daily. 12/23/20   Thayer Headings, PMHNP  Omega-3 Fatty Acids (FISH OIL) 1000 MG CAPS Take by mouth. Patient not taking: Reported on 11/01/2020    [provider]  PARoxetine (PAXIL) 40 MG tablet Take 1 tablet (40 mg total) by mouth daily. 12/23/20 01/22/21  Thayer Headings, PMHNP  traZODone ([place])  50 MG tablet Take 50-75 mg by mouth at bedtime. 12/23/20   Thayer Headings, PMHNP    Allergies    Latex  Review of Systems   Review of Systems  Constitutional: Negative for chills and fever.  HENT: Negative for ear pain and sore throat.   Eyes: Negative for pain and visual disturbance.  Respiratory: Negative for cough and shortness of breath.   Cardiovascular: Negative for chest pain and palpitations.  Gastrointestinal: Negative for abdominal pain and vomiting.  Genitourinary: Negative for dysuria and hematuria.  Musculoskeletal: Negative for arthralgias and back pain.  Skin: Negative for color change and rash.  Neurological: Negative for seizures and syncope.  Psychiatric/Behavioral: Positive for  dysphoric mood and self-injury. The patient is nervous/anxious.   All other systems reviewed and are negative.   Physical Exam Updated Vital Signs BP 139/89 (BP Location: Right Arm)   Pulse 78   Temp 98.2 F (36.8 C) (Oral)   Resp 18   Ht 5\' 4"  (1.626 m)   Wt 59 kg   SpO2 95%   BMI 22.31 kg/m   Physical Exam Vitals and nursing note reviewed.  Constitutional:      General: She is not in acute distress.    Appearance: She is well-developed and well-nourished.  HENT:     Head: Normocephalic and atraumatic.  Eyes:     Conjunctiva/sclera: Conjunctivae normal.  Cardiovascular:     Rate and Rhythm: Normal rate and regular rhythm.     Heart sounds: No murmur heard.   Pulmonary:     Effort: Pulmonary effort is normal. No respiratory distress.     Breath sounds: Normal breath sounds.  Abdominal:     Palpations: Abdomen is soft.     Tenderness: There is no abdominal tenderness.  Musculoskeletal:        General: No deformity, signs of injury or edema.     Cervical back: Neck supple.  Skin:    General: Skin is warm and dry.     Capillary Refill: Capillary refill takes less than 2 seconds.  Neurological:     General: No focal deficit present.     Mental Status: She is alert.  Psychiatric:        Mood and Affect: Mood and affect normal.     Comments: Tearful, flattened affect, positive SI, negative for HI or AVH     ED Results / Procedures / Treatments   Labs (all labs ordered are listed, but only abnormal results are displayed) Labs Reviewed - No data to display  EKG None  Radiology No results found.  Procedures Procedures   Medications Ordered in ED Medications - No data to display  ED Course  I have reviewed the triage vital signs and the nursing notes.  Pertinent labs & imaging results that were available during my care of the patient were reviewed by me and considered in my medical decision making (see chart for details).    MDM Rules/Calculators/A&P                          54-year-old lady presenting to ER with concern for increased depression, suicidal thoughts.  She also endorsed some cutting behavior, she noted to have some very superficial abrasions on her wrists but no significant lacerations requiring repair.  Basic lab work was notable for elevated alcohol but otherwise grossly stable.  Patient is currently voluntary.  She is medically cleared for psychiatric evaluation.  Suspect she may have  some mild withdrawal symptoms and was provided oral Ativan. Consult to TTS.  Final Clinical Impression(s) / ED Diagnoses Final diagnoses:  Alcohol abuse  Suicidal ideation  Depression, unspecified depression type    Rx / DC Orders ED Discharge Orders    None       Lucrezia Starch, MD 01/18/21 2005
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
## 2021-01-17 NOTE — ED Notes (Signed)
Pt has been seen and wand by security.  Pt has 2 bags of belongings.  Belongings carried with EMT to pt's assigned bed.

## 2021-01-18 ENCOUNTER — Encounter (HOSPITAL_COMMUNITY): Payer: Self-pay | Admitting: Psychiatry

## 2021-01-18 ENCOUNTER — Other Ambulatory Visit: Payer: Self-pay | Admitting: Registered Nurse

## 2021-01-18 ENCOUNTER — Inpatient Hospital Stay (HOSPITAL_COMMUNITY)
Admission: RE | Admit: 2021-01-18 | Discharge: 2021-01-24 | DRG: 885 | Disposition: A | Discharge status: Home / Self Care | Payer: BC Managed Care – PPO | Source: Intra-hospital | Attending: Psychiatry | Admitting: Psychiatry

## 2021-01-18 DIAGNOSIS — Z9152 Personal history of nonsuicidal self-harm: Secondary | ICD-10-CM | POA: Diagnosis not present

## 2021-01-18 DIAGNOSIS — Z811 Family history of alcohol abuse and dependence: Secondary | ICD-10-CM

## 2021-01-18 DIAGNOSIS — Z96651 Presence of right artificial knee joint: Secondary | ICD-10-CM | POA: Diagnosis present

## 2021-01-18 DIAGNOSIS — Z91040 Latex allergy status: Secondary | ICD-10-CM | POA: Diagnosis not present

## 2021-01-18 DIAGNOSIS — F1010 Alcohol abuse, uncomplicated: Secondary | ICD-10-CM

## 2021-01-18 DIAGNOSIS — Z79899 Other long term (current) drug therapy: Secondary | ICD-10-CM | POA: Diagnosis not present

## 2021-01-18 DIAGNOSIS — D259 Leiomyoma of uterus, unspecified: Secondary | ICD-10-CM | POA: Diagnosis not present

## 2021-01-18 DIAGNOSIS — F419 Anxiety disorder, unspecified: Secondary | ICD-10-CM | POA: Diagnosis present

## 2021-01-18 DIAGNOSIS — Z818 Family history of other mental and behavioral disorders: Secondary | ICD-10-CM

## 2021-01-18 DIAGNOSIS — F322 Major depressive disorder, single episode, severe without psychotic features: Secondary | ICD-10-CM | POA: Diagnosis not present

## 2021-01-18 DIAGNOSIS — F988 Other specified behavioral and emotional disorders with onset usually occurring in childhood and adolescence: Secondary | ICD-10-CM | POA: Diagnosis present

## 2021-01-18 DIAGNOSIS — F332 Major depressive disorder, recurrent severe without psychotic features: Principal | ICD-10-CM

## 2021-01-18 DIAGNOSIS — Z20822 Contact with and (suspected) exposure to covid-19: Secondary | ICD-10-CM | POA: Diagnosis present

## 2021-01-18 DIAGNOSIS — E349 Endocrine disorder, unspecified: Secondary | ICD-10-CM

## 2021-01-18 LAB — RESP PANEL BY RT-PCR (FLU A&B, COVID) ARPGX2
Influenza A by PCR: NEGATIVE
Influenza B by PCR: NEGATIVE
SARS Coronavirus 2 by RT PCR: NEGATIVE

## 2021-01-18 MED ORDER — ADULT MULTIVITAMIN W/MINERALS CH: 1.0000 | ORAL_TABLET | Freq: Every day | ORAL | Status: DC

## 2021-01-18 MED ORDER — THIAMINE HCL 100 MG PO TABS
100.0000 mg | ORAL_TABLET | Freq: Every day | ORAL | Status: DC
  Administered 2021-01-18 – 2021-01-24 (×7): 100 mg via ORAL
  Filled 2021-01-18 (×8): qty 1

## 2021-01-18 MED ORDER — LOPERAMIDE HCL 2 MG PO CAPS: 2.0000 mg | ORAL_CAPSULE | ORAL | Status: AC | PRN

## 2021-01-18 MED ORDER — THIAMINE HCL 100 MG PO TABS: 100.0000 mg | ORAL_TABLET | Freq: Every day | ORAL | Status: DC

## 2021-01-18 MED ORDER — LOPERAMIDE HCL 2 MG PO CAPS: 2.0000 mg | ORAL_CAPSULE | ORAL | Status: DC | PRN

## 2021-01-18 MED ORDER — THIAMINE HCL 100 MG/ML IJ SOLN: 100.0000 mg | Freq: Once | INTRAMUSCULAR | Status: DC

## 2021-01-18 MED ORDER — PAROXETINE HCL 20 MG PO TABS
ORAL_TABLET | ORAL | Status: AC
  Administered 2021-01-18: 20 mg
  Filled 2021-01-18: qty 1

## 2021-01-18 MED ORDER — LORAZEPAM 1 MG PO TABS: 1.0000 mg | ORAL_TABLET | ORAL | Status: DC | PRN

## 2021-01-18 MED ORDER — TRAZODONE HCL 50 MG PO TABS
50.0000 mg | ORAL_TABLET | Freq: Every evening | ORAL | Status: DC | PRN
  Administered 2021-01-18 – 2021-01-20 (×3): 50 mg via ORAL
  Filled 2021-01-18 (×11): qty 1

## 2021-01-18 MED ORDER — THIAMINE HCL 100 MG PO TABS
100.0000 mg | ORAL_TABLET | Freq: Every day | ORAL | Status: DC
  Administered 2021-01-18: 100 mg via ORAL
  Filled 2021-01-18 (×2): qty 1

## 2021-01-18 MED ORDER — MAGNESIUM HYDROXIDE 400 MG/5ML PO SUSP: 30.0000 mL | Freq: Every day | ORAL | Status: DC | PRN

## 2021-01-18 MED ORDER — ADULT MULTIVITAMIN W/MINERALS CH
1.0000 | ORAL_TABLET | Freq: Every day | ORAL | Status: DC
  Administered 2021-01-18 – 2021-01-24 (×7): 1 via ORAL
  Filled 2021-01-18 (×9): qty 1

## 2021-01-18 MED ORDER — LORAZEPAM 2 MG/ML IJ SOLN: 1.0000 mg | INTRAMUSCULAR | Status: DC | PRN

## 2021-01-18 MED ORDER — THIAMINE HCL 100 MG/ML IJ SOLN: 100.0000 mg | Freq: Every day | INTRAMUSCULAR | Status: DC

## 2021-01-18 MED ORDER — ONDANSETRON 4 MG PO TBDP
4.0000 mg | ORAL_TABLET | Freq: Four times a day (QID) | ORAL | Status: AC | PRN
  Administered 2021-01-18: 4 mg via ORAL
  Filled 2021-01-18: qty 1

## 2021-01-18 MED ORDER — FOLIC ACID 1 MG PO TABS
1.0000 mg | ORAL_TABLET | Freq: Every day | ORAL | Status: DC
  Administered 2021-01-18 – 2021-01-24 (×7): 1 mg via ORAL
  Filled 2021-01-18 (×9): qty 1

## 2021-01-18 MED ORDER — FOLIC ACID 1 MG PO TABS
1.0000 mg | ORAL_TABLET | Freq: Every day | ORAL | Status: DC
  Filled 2021-01-18 (×2): qty 1

## 2021-01-18 MED ORDER — ONDANSETRON 4 MG PO TBDP: 4.0000 mg | ORAL_TABLET | Freq: Four times a day (QID) | ORAL | Status: DC | PRN

## 2021-01-18 MED ORDER — IBUPROFEN 400 MG PO TABS
400.0000 mg | ORAL_TABLET | Freq: Four times a day (QID) | ORAL | Status: DC | PRN
  Administered 2021-01-18 – 2021-01-19 (×2): 400 mg via ORAL
  Filled 2021-01-18 (×2): qty 1

## 2021-01-18 MED ORDER — ALUM & MAG HYDROXIDE-SIMETH 200-200-20 MG/5ML PO SUSP: 30.0000 mL | ORAL | Status: DC | PRN

## 2021-01-18 MED ORDER — LORAZEPAM 1 MG PO TABS: 1.0000 mg | ORAL_TABLET | Freq: Four times a day (QID) | ORAL | Status: DC | PRN

## 2021-01-18 MED ORDER — HYDROXYZINE HCL 25 MG PO TABS
25.0000 mg | ORAL_TABLET | Freq: Four times a day (QID) | ORAL | Status: AC | PRN
  Administered 2021-01-18 – 2021-01-20 (×5): 25 mg via ORAL
  Filled 2021-01-18 (×6): qty 1

## 2021-01-18 MED ORDER — ADULT MULTIVITAMIN W/MINERALS CH
1.0000 | ORAL_TABLET | Freq: Every day | ORAL | Status: DC
  Administered 2021-01-18: 1 via ORAL
  Filled 2021-01-18 (×2): qty 1

## 2021-01-18 MED ORDER — LORAZEPAM 1 MG PO TABS: 2.0000 mg | ORAL_TABLET | Freq: Once | ORAL | Status: DC | PRN

## 2021-01-18 MED ORDER — ACETAMINOPHEN 325 MG PO TABS
650.0000 mg | ORAL_TABLET | Freq: Four times a day (QID) | ORAL | Status: DC | PRN
  Administered 2021-01-20: 650 mg via ORAL
  Filled 2021-01-18: qty 2

## 2021-01-18 MED ORDER — HYDROXYZINE HCL 25 MG PO TABS: 25.0000 mg | ORAL_TABLET | Freq: Four times a day (QID) | ORAL | Status: DC | PRN

## 2021-01-18 MED ORDER — LORAZEPAM 1 MG PO TABS
1.0000 mg | ORAL_TABLET | Freq: Four times a day (QID) | ORAL | Status: AC | PRN
  Administered 2021-01-18: 1 mg via ORAL
  Filled 2021-01-18: qty 1

## 2021-01-18 MED ORDER — PAROXETINE HCL 20 MG PO TABS
20.0000 mg | ORAL_TABLET | Freq: Every day | ORAL | Status: DC
  Administered 2021-01-19 – 2021-01-21 (×3): 20 mg via ORAL
  Filled 2021-01-18 (×6): qty 1

## 2021-01-18 NOTE — ED Notes (Signed)
Pt alert, cooperative at this time.  Pt reports ongoing feeling of "hopelessness....shame" Reports impending fear of losing her job.   One personal cloth bag with multicolored stripes removed from pt, placed with pt label in cupboard behind RN station labeled "Rm 16-18 RES A"

## 2021-01-18 NOTE — ED Notes (Signed)
4 bags- 3 pt belonging bags and 1 personal bag returned to pt.  Pt ambulatory to ED entrance to meet with safe transport.  No further needs expressed by pt.
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
## 2021-01-18 NOTE — Progress Notes (Signed)
Adult Psychoeducational Group Note  Date:  01/18/2021 Time:  9:17 PM  Group Topic/Focus:  Wrap-Up Group:   The focus of this group is to help patients review their daily goal of treatment and discuss progress on daily workbooks.  Participation Level:  Minimal  Participation Quality:  Resistant  Affect:  Depressed  Cognitive:  Disorganized  Insight: Lacking  Engagement in Group:  Lacking  Modes of Intervention:  Limit-setting  Additional Comments:  Patient said her day was 2. Her goal for today help with not feeling suicide and not to drink. Patient said she achieve her goal. She is not sure what coping skills she has. Patient said she is still feeling hopeless.    Debra Rhodes Long 01/18/2021, 9:17 PM

## 2021-01-18 NOTE — Progress Notes (Signed)
Recreation Therapy Notes  Animal-Assisted Activity (AAA) Program Checklist/Progress Notes Patient Eligibility Criteria Checklist & Daily Group note for Rec Tx Intervention  Date: 01/18/2021 Time: 3:00pm Location: [address]   AAA/T Program Assumption of Risk Form signed by Patient/ or Parent Legal Guardian YES  Patient is free of allergies or severe asthma YES  Patient reports no fear of animals YES  Patient reports no history of cruelty to animals YES  Patient understands their participation is voluntary YES  Patient washes hands before animal contact YES  Patient washes hands after animal contact YES  Behavioral Response: Active  Education: Contractor, Appropriate Animal Interaction   Education Outcome: Acknowledges understanding  Clinical Observations/Feedback: Pt attended group session and interacted appropriately with peers, staff, Nurse, adult and therapy dog, Bodi.   Nunzio Cory Mylen Mangan, LRT/CTRS Bjorn Loser Jaze Rodino 01/18/2021, 4:09 PM

## 2021-01-18 NOTE — Tx Team (Signed)
Initial Treatment Plan 01/18/2021 2:53 PM [name] KJI:[id number]    PATIENT STRESSORS: Financial difficulties Health problems Medication change or noncompliance Occupational concerns Substance abuse   PATIENT STRENGTHS: Ability for insight Average or above average intelligence Capable of independent living Communication skills Motivation for treatment/growth Physical Health Supportive family/friends Work skills   PATIENT IDENTIFIED PROBLEMS: anxiety  depression  Suicidal ideations  Substance use/abuse               DISCHARGE CRITERIA:  Ability to meet basic life and health needs Improved stabilization in mood, thinking, and/or behavior Motivation to continue treatment in a less acute level of care Need for constant or close observation no longer present  PRELIMINARY DISCHARGE PLAN: Attend aftercare/continuing care group Attend 12-step recovery group Outpatient therapy Return to previous living arrangement Return to previous work or school arrangements  PATIENT/FAMILY INVOLVEMENT: This treatment plan has been presented to and reviewed with the patient, Debra Rhodes.  The patient and family have been given the opportunity to ask questions and make suggestions.  Baron Sane, RN 01/18/2021, 2:53 PM

## 2021-01-18 NOTE — BH Assessment (Signed)
[place] Assessment Progress Note  Per Shuvon Rankin, NP, this pt requires psychiatric hospitalization at this time.  Danika, RN, [facility] has assigned pt to [facility] Rm 301-2 to the service of Myles Lipps, MD.  [facility] will be ready to receive pt at 12:30.  Pt has signed Voluntary Admission and Consent for Treatment, as well as Consent to Release Information to her outpatient provider, Thayer Headings, NP, as well as her daughter and her sponsor, and a notification call has been placed to the provider.  Signed forms have been faxed to [facility].  EDP Lacretia Leigh, MD and pt's nurse, Carolynne, have been notified, and Carolynne agrees to send original paperwork along with pt via [employer], and to call report to [phone].  Jalene Mullet, [place] Coordinator [phone]

## 2021-01-18 NOTE — Progress Notes (Signed)
Adult Psychoeducational Group Note  Date:  01/18/2021 Time:  4:33 PM  Group Topic/Focus:  Goals Group:   The focus of this group is to help patients establish daily goals to achieve during treatment and discuss how the patient can incorporate goal setting into their daily lives to aide in recovery.  Participation Level:  Active  Participation Quality:  Appropriate  Affect:  Appropriate  Cognitive:  Alert  Insight: Appropriate  Engagement in Group:  Engaged  Modes of Intervention:  Discussion  Additional Comments:  Pt attended group and participated in discussion.  Dimitris Shanahan R Mikias Lanz 01/18/2021, 4:33 PM

## 2021-01-18 NOTE — ED Notes (Signed)
Attempted to report to receiving unit at [facility], no answer.
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
## 2021-01-18 NOTE — Progress Notes (Signed)
D: Patient presents with depressed and anxious affect. Patient reports having a rough day and a phone conversation with her daughter that hurt her feelings. Patient is cooperative and pleasant during assessment. Patient is positive for passive SI but verbally contracts for safety. Patient denies HI/AH/VH at this time. A: Provided positive reinforcement and encouragement.  R: Patient cooperative and receptive to efforts. Patient remains safe on the unit.   01/18/21 2113  Psych Admission Type (Psych Patients Only)  Admission Status Voluntary  Psychosocial Assessment  Patient Complaints Anxiety;Depression;Sadness  Eye Contact Fair  Facial Expression Anxious;Sad;Pensive  Affect Anxious;Sad;Depressed  Speech Logical/coherent  Interaction Assertive  Motor Activity Fidgety  Appearance/Hygiene Unremarkable  Behavior Characteristics Cooperative;Appropriate to situation  Mood Anxious;Depressed;Sad  Thought Process  Coherency WDL  Content Blaming self  Delusions None reported or observed  Perception WDL  Hallucination None reported or observed  Judgment Poor  Confusion None  Danger to Self  Current suicidal ideation? Passive  Self-Injurious Behavior Some self-injurious ideation observed or expressed.  No lethal plan expressed   Agreement Not to Harm Self Yes  Description of Agreement verbal contract  Danger to Others  Danger to Others None reported or observed

## 2021-01-19 DIAGNOSIS — F322 Major depressive disorder, single episode, severe without psychotic features: Secondary | ICD-10-CM | POA: Diagnosis not present

## 2021-01-19 LAB — URINALYSIS, ROUTINE W REFLEX MICROSCOPIC
Bilirubin Urine: NEGATIVE
Glucose, UA: NEGATIVE mg/dL
Ketones, ur: NEGATIVE mg/dL
Leukocytes,Ua: NEGATIVE
Nitrite: NEGATIVE
Protein, ur: NEGATIVE mg/dL
Specific Gravity, Urine: 1.009 (ref 1.005–1.030)
pH: 6 (ref 5.0–8.0)

## 2021-01-19 LAB — HEMOGLOBIN A1C
Hgb A1c MFr Bld: 5.2 % (ref 4.8–5.6)
Mean Plasma Glucose: 102.54 mg/dL

## 2021-01-19 LAB — LIPID PANEL
Cholesterol: 300 mg/dL — ABNORMAL HIGH (ref 0–200)
HDL: 120 mg/dL (ref >40)
LDL Cholesterol: 172 mg/dL — ABNORMAL HIGH (ref 0–99)
Total CHOL/HDL Ratio: 2.5 RATIO
Triglycerides: 41 mg/dL (ref <150)
VLDL: 8 mg/dL (ref 0–40)

## 2021-01-19 LAB — PREGNANCY, URINE: Preg Test, Ur: NEGATIVE

## 2021-01-19 LAB — TSH: TSH: 2.87 u[IU]/mL (ref 0.350–4.500)

## 2021-01-19 MED ORDER — WHITE PETROLATUM EX OINT
TOPICAL_OINTMENT | CUTANEOUS | Status: AC
  Filled 2021-01-19: qty 5

## 2021-01-19 MED ORDER — GABAPENTIN 300 MG PO CAPS
300.0000 mg | ORAL_CAPSULE | Freq: Three times a day (TID) | ORAL | Status: DC
  Administered 2021-01-19 – 2021-01-23 (×13): 300 mg via ORAL
  Filled 2021-01-19 (×18): qty 1

## 2021-01-19 NOTE — Progress Notes (Signed)
D: Patient presents with sad affect but is pleasant and cooperative at time of assessment. Patient reports feeling much better than the previous day and states she enjoyed the opportunity to get some fresh air. Patient is still positive for passive SI stating the suicidal thoughts come and go but verbally contracts for safety. Patient denies HI/AH/VH at this time.  A: Provided positive reinforcement and encouragement.  R: Patient cooperative and receptive to efforts. Patient remains safe on the unit.   01/19/21 2117  Psych Admission Type (Psych Patients Only)  Admission Status Voluntary  Psychosocial Assessment  Patient Complaints Anxiety;Depression  Eye Contact Fair  Facial Expression Pensive  Affect Depressed;Sad  Speech Logical/coherent  Interaction Assertive  Motor Activity Fidgety  Appearance/Hygiene Unremarkable  Behavior Characteristics Cooperative;Appropriate to situation  Mood Depressed  Thought Process  Coherency WDL  Content Blaming self  Delusions None reported or observed  Perception WDL  Hallucination None reported or observed  Judgment Poor  Confusion None  Danger to Self  Current suicidal ideation? Passive  Self-Injurious Behavior No self-injurious ideation or behavior indicators observed or expressed   Agreement Not to Harm Self Yes  Description of Agreement verbal contract  Danger to Others  Danger to Others None reported or observed

## 2021-01-19 NOTE — BHH Group Notes (Signed)
LCSW Group Therapy Note  Type of Therapy/Topic: Group Therapy: Six Dimensions of Wellness  Participation Level: Active  Description of Group:  This group will address the concept of wellness and the six concepts of wellness: occupational, physical, social, intellectual, spiritual, and emotional. Patients will be encouraged to process areas in their lives that are out of balance and identify reasons for remaining unbalanced. Patients will be encouraged to explore ways to practice healthy habits daily to attain better physical and mental health outcomes.  Therapeutic Goals:  1. Identify aspects of wellness that they are doing well.  2. Identify aspects of wellness that they would like to improve upon.  3. Identify one action they can take to improve an aspect of wellness in their lives.  Summary of Patient Progress:  Debra Rhodes spent time discussing wellness with her peers during a recreational activity.
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
## 2021-01-19 NOTE — Progress Notes (Signed)
Recreation Therapy Notes  Date: 01/19/2021 Time: 930a Location: [address] Dayroom  Group Topic: Stress Management  Goal Area(s) Addresses:  Patient will identify positive stress management techniques. Patient will identify benefits of using stress management post d/c.  Behavioral Response: Appropriate, Active  Intervention: Relaxation, Therapeutic Coloring  Activity: [place]andalas and Music. Patients were provided the choice of various mandala and positive mantra coloring pages to reflect on during the group session. Patient used colored pencils to create their own patterns and practice artistic expression. LRT used a calming playlist of instrumental music and spa sounds to promote mindfulness and relaxation.   Education:  Stress Management, Discharge Planning  Education Outcome: Acknowledges Education  Clinical Observations/Feedback: Pt was attentive and pleasant throughout group session. Pt selected coloring page with positive mantra reading "Just Breathe". Pt called out to meet with Dr and CSW at various points in group. Pt noted to smile and socialize with peers during participation. Pt reported positive experience as a result of engagement in offered activity.    Fabiola Backer, LRT/CTRS Bjorn Loser Nithila Sumners 01/19/2021, 12:31 PM

## 2021-01-19 NOTE — Tx Team (Signed)
Interdisciplinary Treatment and Diagnostic Plan Update  01/19/2021 Time of Session: [address] MRN: [id number]  Principal Diagnosis: <principal problem not specified>  Secondary Diagnoses: Active Problems:   MDD (major depressive disorder), single episode, severe , no psychosis ([place])   Current Medications:  Current Facility-Administered Medications  Medication Dose Route Frequency Provider Last Rate Last Admin   acetaminophen (TYLENOL) tablet 650 mg  650 mg Oral Q6H PRN Rankin, Shuvon B, NP       alum & mag hydroxide-simeth (MAALOX/MYLANTA) 200-200-20 MG/5ML suspension 30 mL  30 mL Oral Q4H PRN Rankin, Shuvon B, NP       folic acid (FOLVITE) tablet 1 mg  1 mg Oral Daily Ethelene Hal, NP   1 mg at 01/19/21 0854   hydrOXYzine (ATARAX/VISTARIL) tablet 25 mg  25 mg Oral Q6H PRN Ethelene Hal, NP   25 mg at 01/18/21 2113   ibuprofen (ADVIL) tablet 400 mg  400 mg Oral Q6H PRN Ethelene Hal, NP   400 mg at 01/19/21 1131   loperamide (IMODIUM) capsule 2-4 mg  2-4 mg Oral PRN Ethelene Hal, NP       LORazepam (ATIVAN) tablet 1 mg  1 mg Oral Q6H PRN Ethelene Hal, NP   1 mg at 01/18/21 1712   multivitamin with minerals tablet 1 tablet  1 tablet Oral Daily Ethelene Hal, NP   1 tablet at 01/19/21 0854   ondansetron (ZOFRAN-ODT) disintegrating tablet 4 mg  4 mg Oral Q6H PRN Ethelene Hal, NP   4 mg at 01/18/21 1713   PARoxetine (PAXIL) tablet 20 mg  20 mg Oral Daily Ethelene Hal, NP   20 mg at 01/19/21 9983   thiamine tablet 100 mg  100 mg Oral Daily Ethelene Hal, NP   100 mg at 01/19/21 0855   traZODone (DESYREL) tablet 50 mg  50 mg Oral QHS,MR X 1 Ethelene Hal, NP   50 mg at 01/18/21 2113   PTA Medications: Medications Prior to Admission  Medication Sig Dispense Refill Last Dose   Biotin w/ Vitamins C & E (HAIR/SKIN/NAILS PO) Take 1 tablet by mouth daily.      cetirizine (ZYRTEC) 10 MG tablet Take 10 mg by  mouth daily.      estradiol-norethindrone (COMBIPATCH) 0.05-0.14 MG/DAY Place 1 patch onto the skin once a week.      ferrous sulfate (FERROUSUL) 325 (65 FE) MG tablet Take 1 tablet (325 mg total) by mouth 3 (three) times daily with meals for 14 days. (Patient not taking: Reported on 01/18/2021) 42 tablet 0    gabapentin (NEURONTIN) 300 MG capsule Take 1 capsule (300 mg total) by mouth 3 (three) times daily as needed. (Patient taking differently: Take 300 mg by mouth 3 (three) times daily as needed (anxiety).) 270 capsule 0    glucosamine-chondroitin 500-400 MG tablet Take 1 tablet by mouth daily.      ibuprofen (ADVIL) 200 MG tablet Take 600 mg by mouth every 6 (six) hours as needed for fever, headache or mild pain.      methylphenidate 18 MG PO CR tablet Take 1 tablet (18 mg total) by mouth daily. (Patient taking differently: Take 18 mg by mouth daily as needed (for work).) 30 tablet 0    PARoxetine (PAXIL) 40 MG tablet Take 1 tablet (40 mg total) by mouth daily. 30 tablet 2    polyvinyl alcohol (LIQUIFILM TEARS) 1.4 % ophthalmic solution Place 1 drop into both eyes  as needed for dry eyes.      traZODone (DESYREL) 50 MG tablet Take 50-75 mg by mouth at bedtime. (Patient taking differently: Take 50 mg by mouth at bedtime.) 135 tablet 0    VITAMIN D PO Take 1 tablet by mouth daily.       Patient Stressors: Financial difficulties Health problems Medication change or noncompliance Occupational concerns Substance abuse  Patient Strengths: Ability for insight Average or above average intelligence Capable of independent living Communication skills Motivation for treatment/growth Physical Health Supportive family/friends Work skills  Treatment Modalities: Medication Management, Group therapy, Case management,  1 to 1 session with clinician, Psychoeducation, Recreational therapy.   Physician Treatment Plan for Primary Diagnosis: <principal problem not specified> Long Term Goal(s):     Short  Term Goals:    Medication Management: Evaluate patient's response, side effects, and tolerance of medication regimen.  Therapeutic Interventions: 1 to 1 sessions, Unit Group sessions and Medication administration.  Evaluation of Outcomes: Not Met  Physician Treatment Plan for Secondary Diagnosis: Active Problems:   MDD (major depressive disorder), single episode, severe , no psychosis ([place])  Long Term Goal(s):     Short Term Goals:       Medication Management: Evaluate patient's response, side effects, and tolerance of medication regimen.  Therapeutic Interventions: 1 to 1 sessions, Unit Group sessions and Medication administration.  Evaluation of Outcomes: Not Met   RN Treatment Plan for Primary Diagnosis: <principal problem not specified> Long Term Goal(s): Knowledge of disease and therapeutic regimen to maintain health will improve  Short Term Goals: Ability to verbalize feelings will improve, Ability to identify and develop effective coping behaviors will improve and Compliance with prescribed medications will improve  Medication Management: RN will administer medications as ordered by provider, will assess and evaluate patient's response and provide education to patient for prescribed medication. RN will report any adverse and/or side effects to prescribing provider.  Therapeutic Interventions: 1 on 1 counseling sessions, Psychoeducation, Medication administration, Evaluate responses to treatment, Monitor vital signs and CBGs as ordered, Perform/monitor CIWA, COWS, AIMS and Fall Risk screenings as ordered, Perform wound care treatments as ordered.  Evaluation of Outcomes: Not Met   LCSW Treatment Plan for Primary Diagnosis: <principal problem not specified> Long Term Goal(s): Safe transition to appropriate next level of care at discharge, Engage patient in therapeutic group addressing interpersonal concerns.  Short Term Goals: Engage patient in aftercare planning with  referrals and resources, Increase social support and Facilitate patient progression through stages of change regarding substance use diagnoses and concerns  Therapeutic Interventions: Assess for all discharge needs, 1 to 1 time with Social worker, Explore available resources and support systems, Assess for adequacy in community support network, Educate family and significant other(s) on suicide prevention, Complete Psychosocial Assessment, Interpersonal group therapy.  Evaluation of Outcomes: Not Met   Progress in Treatment: Attending groups: Yes. Participating in groups: Yes. Taking medication as prescribed: Yes. Toleration medication: Yes. Family/Significant other contact made: No, will contact:  friend Patient understands diagnosis: Yes. Discussing patient identified problems/goals with staff: Yes. Medical problems stabilized or resolved: Yes. Denies suicidal/homicidal ideation: Yes. Issues/concerns per patient self-inventory: Yes. Other: None  New problem(s) identified: No, Describe:  CSW will continue to assess  New Short Term/Long Term Goal(s):medication stabilization, elimination of SI thoughts, development of comprehensive mental wellness plan.   Patient Goals: "not be suicidal."   Discharge Plan or Barriers: Patient recently admitted. CSW will continue to follow and assess for appropriate referrals and possible discharge planning.  Reason for Continuation of Hospitalization: Depression Medication stabilization Suicidal ideation  Estimated Length of Stay: 3-5 days  Attendees: Patient: Debra Rhodes 01/19/2021   Physician: Myles Lipps, MD 01/19/2021   Nursing:  01/19/2021   RN Care Manager: 01/19/2021   Social Worker: Toney Reil, [place] 01/19/2021   Recreational Therapist:  01/19/2021   Other:  01/19/2021   Other:  01/19/2021   Other: 01/19/2021     Scribe for Treatment Team: Mliss Fritz, Latanya Presser 01/19/2021 2:57 PM

## 2021-01-19 NOTE — Progress Notes (Incomplete)
Pt presents with sadness, depression and anxiety.  Passive SI thoughts with no plan are noted.  Pt contracts for safety on the unit.  Pt said her goal was to be out of her room as much as possible and to spend time with other pt's.

## 2021-01-19 NOTE — Progress Notes (Signed)
The patient stated that her coping skill for discharge is to "reach out to people". Her goal for tomorrow is to make phone calls once her thoughts begin to clear.

## 2021-01-20 DIAGNOSIS — F322 Major depressive disorder, single episode, severe without psychotic features: Secondary | ICD-10-CM | POA: Diagnosis not present

## 2021-01-20 MED ORDER — MAGNESIUM HYDROXIDE 400 MG/5ML PO SUSP
30.0000 mL | Freq: Every day | ORAL | Status: DC | PRN
  Administered 2021-01-20 – 2021-01-21 (×2): 30 mL via ORAL
  Filled 2021-01-20 (×3): qty 30

## 2021-01-20 MED ORDER — DULOXETINE HCL 30 MG PO CPEP
30.0000 mg | ORAL_CAPSULE | Freq: Every day | ORAL | Status: DC
  Administered 2021-01-20 – 2021-01-22 (×3): 30 mg via ORAL
  Filled 2021-01-20 (×6): qty 1

## 2021-01-20 MED ORDER — ACAMPROSATE CALCIUM 333 MG PO TBEC
666.0000 mg | DELAYED_RELEASE_TABLET | Freq: Two times a day (BID) | ORAL | Status: DC
  Administered 2021-01-20 – 2021-01-24 (×8): 666 mg via ORAL
  Filled 2021-01-20 (×10): qty 2
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
## 2021-01-20 NOTE — Progress Notes (Incomplete)
Adult Psychoeducational Group Note  Date:  01/20/2021 Time:  10:00 PM  Group Topic/Focus:  Wrap-Up Group:   The focus of this group is to help patients review their daily goal of treatment and discuss progress on daily workbooks.  Participation Level:  Active  Participation Quality:  Appropriate  Affect:  Appropriate  Cognitive:  Appropriate  Insight: Appropriate  Engagement in Group:  Engaged  Modes of Intervention:  Discussion  Additional Comments:  Patient said her day was a 5. Her goal for today not to have suicidal thoughts. She mainly achieve her goal. The coping skills staying with the group and keeping buzy.  Debra Rhodes 01/20/2021, 10:00 PM

## 2021-01-20 NOTE — Progress Notes (Signed)
[facility] MD Progress Note  01/20/2021 9:58 AM Debra Rhodes  MRN:  [id number] Subjective: Patient is a 60 year old female with a past psychiatric history significant for alcohol dependence, major depression, anxiety who presented to the [facility] emergency department on 01/17/2021 secondary to excessive drinking over 3 days prior to admission, decreased sleep and suicidal ideation.  Objective: Patient is seen and examined. Patient is a 59 year old female with the above-stated past psychiatric history who is seen in follow-up. She stated she feels a bit better today. She stated this morning she has had some shaking chills and tremors. Her most recent CIWA this morning was 8. We had a more lengthy discussion today about her medications. Review of her medication showed at least during her episodes of sobriety in 2021 the Paxil was not assisting with her depression. We discussed the transition to another medication. She stated that she had previously had problems switching to sertraline, and that she felt as though her anxiety worsened. She also has been on other SSRIs in the past. We discussed the possibility of switching to duloxetine or venlafaxine. This morning her mood is essentially unchanged. Her vital signs are stable, she is afebrile. Pulse oximetry was 100% on room air. She continues to have fleeting suicidal ideation. As previously mentioned her total cholesterol was 300, and her LDL was 172. We discussed contacting her primary care provider after discharge and doing dietary changes prior to initiating any statin therapy. We also discussed the fact that the statins are highly active, and given the alcohol situation would need to be more closely monitored. TSH from 3/2 was normal at 2.870. No other new laboratories. She slept 6.25 hours last night.  Principal Problem: <principal problem not specified> Diagnosis: Active Problems:   MDD (major depressive disorder), single episode, severe ,  no psychosis ([place])  Total Time spent with patient: 20 minutes  Past Psychiatric History: See admission H&P  Past Medical History:  Past Medical History:  Diagnosis Date  . Anxiety   . Depression   . PONV (postoperative nausea and vomiting)     Past Surgical History:  Procedure Laterality Date  . AUGMENTATION MAMMAPLASTY Bilateral   . BACK SURGERY    . CESAREAN SECTION    . FRACTURE SURGERY    . 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 Anxiety disorder Sister   . Depression Brother   . Schizophrenia Brother   . Suicidality Brother   . Anxiety disorder Brother   . Alcohol abuse Sister   . Alcohol abuse Brother   . Alcohol abuse Brother    Family Psychiatric  History: See admission H&P Social History:  Social History   Substance and Sexual Activity  Alcohol Use Yes   Comment: binge drinking- was in recovery for past 4 years     Social History   Substance and Sexual Activity  Drug Use No    Social History   Socioeconomic History  . Marital status: Divorced    Spouse name: Not on file  . Number of children: Not on file  . Years of education: Not on file  . Highest education level: Not on file  Occupational History  . Not on file  Tobacco Use  . Smoking status: Never Smoker  . Smokeless tobacco: Never  Used  Vaping Use  . Vaping Use: Never used  Substance and Sexual Activity  . 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 Drug use: No  . Sexual activity: Not Currently  Other Topics Concern  . Not on file  Social History Narrative  . Not on file   Social Determinants of Health   Financial Resource Strain: Not on file  Food Insecurity: Not on file  Transportation Needs: Not on file  Physical Activity: Not on file   Stress: Not on file  Social Connections: Not on file   Additional Social History:                         Sleep: Fair  Appetite:  Fair  Current Medications: Current Facility-Administered Medications  Medication Dose Route Frequency Provider Last Rate Last Admin  . acetaminophen (TYLENOL) tablet 650 mg  650 mg Oral Q6H PRN Rankin, Shuvon B, NP      . alum & mag hydroxide-simeth (MAALOX/MYLANTA) 200-200-20 MG/5ML suspension 30 mL  30 mL Oral Q4H PRN Rankin, Shuvon B, NP      . folic acid (FOLVITE) tablet 1 mg  1 mg Oral Daily Ethelene Hal, NP   1 mg at 01/20/21 0818  . gabapentin (NEURONTIN) capsule 300 mg  300 mg Oral TID Lindell Spar I, NP   300 mg at 01/20/21 0818  . hydrOXYzine (ATARAX/VISTARIL) tablet 25 mg  25 mg Oral Q6H PRN Ethelene Hal, NP   25 mg at 01/20/21 0819  . ibuprofen (ADVIL) tablet 400 mg  400 mg Oral Q6H PRN Ethelene Hal, NP   400 mg at 01/19/21 1131  . loperamide (IMODIUM) capsule 2-4 mg  2-4 mg Oral PRN Ethelene Hal, NP      . LORazepam (ATIVAN) tablet 1 mg  1 mg Oral Q6H PRN Ethelene Hal, NP   1 mg at 01/18/21 1712  . multivitamin with minerals tablet 1 tablet  1 tablet Oral Daily Ethelene Hal, NP   1 tablet at 01/20/21 0818  . ondansetron (ZOFRAN-ODT) disintegrating tablet 4 mg  4 mg Oral Q6H PRN Ethelene Hal, NP   4 mg at 01/18/21 1713  . PARoxetine (PAXIL) tablet 20 mg  20 mg Oral Daily Ethelene Hal, NP   20 mg at 01/20/21 0818  . thiamine tablet 100 mg  100 mg Oral Daily Ethelene Hal, NP   100 mg at 01/20/21 0818  . traZODone (DESYREL) tablet 50 mg  50 mg Oral QHS,MR X 1 Ethelene Hal, NP   50 mg at 01/19/21 2117    Lab Results:  Results for orders placed or performed during the hospital encounter of 01/18/21 (from the past 48 hour(s))  Urinalysis, Routine w reflex microscopic Urine, Clean Catch     Status: Abnormal   Collection Time: 01/18/21  9:19 PM  Result  Value Ref Range   Color, Urine YELLOW YELLOW   APPearance CLEAR CLEAR   Specific Gravity, Urine 1.009 1.005 - 1.030   pH 6.0 5.0 - 8.0   Glucose, UA NEGATIVE NEGATIVE mg/dL   Hgb urine dipstick MODERATE (A) NEGATIVE   Bilirubin Urine NEGATIVE NEGATIVE   Ketones, ur NEGATIVE NEGATIVE mg/dL   Protein, ur NEGATIVE NEGATIVE mg/dL   Nitrite NEGATIVE NEGATIVE   Leukocytes,Ua NEGATIVE NEGATIVE   RBC / HPF 0-5 0 - 5 RBC/hpf   WBC, UA  0-5 0 - 5 WBC/hpf   Bacteria, UA RARE (A) NONE SEEN   Squamous Epithelial / LPF 0-5 0 - 5    Comment: Performed at [facility] [place], [place] [address] [place], [place] [address]  Pregnancy, urine     Status: None   Collection Time: 01/18/21  9:19 PM  Result Value Ref Range   Preg Test, Ur NEGATIVE NEGATIVE    Comment:        THE SENSITIVITY OF THIS METHODOLOGY IS >24 mIU/mL Performed at [facility] [address] [place], [place] [address]   TSH     Status: None   Collection Time: 01/19/21  6:22 AM  Result Value Ref Range   TSH 2.870 0.350 - 4.500 uIU/mL    Comment: Performed by a 3rd Generation assay with a functional sensitivity of <=0.01 uIU/mL. Performed at [facility], [place] [address] [place], [place] [address]   Lipid panel     Status: Abnormal   Collection Time: 01/19/21  6:22 AM  Result Value Ref Range   Cholesterol 300 (H) 0 - 200 mg/dL   Triglycerides 41 <150 mg/dL   HDL 120 >40 mg/dL   Total CHOL/HDL Ratio 2.5 RATIO   VLDL 8 0 - 40 mg/dL   LDL Cholesterol 172 (H) 0 - 99 mg/dL    Comment:        Total Cholesterol/HDL:CHD Risk Coronary Heart Disease Risk Table                     Men   Women  1/2 Average Risk   3.4   3.3  Average Risk       5.0   4.4  2 X Average Risk   9.6   7.1  3 X Average Risk  23.4   11.0        Use the calculated Patient Ratio above and the CHD Risk Table to determine the patient's CHD Risk.        ATP III CLASSIFICATION (LDL):  <100     mg/dL    Optimal  100-129  mg/dL   Near or Above                    Optimal  130-159  mg/dL   Borderline  160-189  mg/dL   High  >190     mg/dL   Very High Performed at [facility] [address] [place], [place] [address]   Hemoglobin A1c     Status: None   Collection Time: 01/19/21  6:22 AM  Result Value Ref Range   Hgb A1c MFr Bld 5.2 4.8 - 5.6 %    Comment: (NOTE) Pre diabetes:          5.7%-6.4%  Diabetes:              >6.4%  Glycemic control for   <7.0% adults with diabetes    Mean Plasma Glucose 102.54 mg/dL    Comment: Performed at [facility] [address], [place], [place] [address]    Blood Alcohol level:  Lab Results  Component Value Date   ETH 267 (H) 00/93/8182    Metabolic Disorder Labs: Lab Results  Component Value Date   HGBA1C 5.2 01/19/2021   MPG 102.54 01/19/2021   No results found for: PROLACTIN Lab Results  Component Value Date   CHOL 300 (H) 01/19/2021   TRIG 41 01/19/2021   HDL  120 01/19/2021   CHOLHDL 2.5 01/19/2021   VLDL 8 01/19/2021   LDLCALC 172 (H) 01/19/2021    Physical Findings: AIMS: Facial and Oral Movements Muscles of Facial Expression: None, normal Lips and Perioral Area: None, normal Jaw: None, normal Tongue: None, normal,Extremity Movements Upper (arms, wrists, hands, fingers): None, normal Lower (legs, knees, ankles, toes): None, normal, Trunk Movements Neck, shoulders, hips: None, normal, Overall Severity Severity of abnormal movements (highest score from questions above): None, normal Incapacitation due to abnormal movements: None, normal Patient's awareness of abnormal movements (rate only patient's report): No Awareness, Dental Status Current problems with teeth and/or dentures?: No Does patient usually wear dentures?: No  CIWA:  CIWA-Ar Total: 8 COWS:     Musculoskeletal: Strength & Muscle Tone: within normal limits Gait & Station: normal Patient leans: N/A  Psychiatric Specialty  Exam: Physical Exam Vitals and nursing note reviewed.  HENT:     Head: Normocephalic and atraumatic.  Pulmonary:     Effort: Pulmonary effort is normal.  Neurological:     General: No focal deficit present.     Mental Status: She is alert and oriented to person, place, and time.     Review of Systems  Blood pressure (!) 87/74, pulse 86, temperature 98.3 F (36.8 C), temperature source Oral, resp. rate 16, height 5\' 4"  (1.626 m), weight 54 kg, SpO2 100 %.Body mass index is 20.43 kg/m.  General Appearance: Fairly Groomed  Eye Contact:  Fair  Speech:  Normal Rate  Volume:  Decreased  Mood:  Anxious and Depressed  Affect:  Congruent and Tearful  Thought Process:  Coherent and Descriptions of Associations: Intact  Orientation:  Full (Time, Place, and Person)  Thought Content:  Logical  Suicidal Thoughts:  Yes.  without intent/plan  Homicidal Thoughts:  No  Memory:  Immediate;   Good Recent;   Good Remote;   Good  Judgement:  Intact  Insight:  Fair  Psychomotor Activity:  Increased  Concentration:  Concentration: Fair and Attention Span: Fair  Recall:  [employer] of Knowledge:  Fair  Language:  Good  Akathisia:  Negative  Handed:  Right  AIMS (if indicated):     Assets:  Desire for Improvement Housing Resilience  ADL's:  Intact  Cognition:  WNL  Sleep:  Number of Hours: 6.75     Treatment Plan Summary: Daily contact with patient to assess and evaluate symptoms and progress in treatment, Medication management and Plan : Patient is seen and examined. Patient is a 60 year old female with the above-stated past psychiatric history who is seen in follow-up.   Diagnosis: 1. Major depression, recurrent, severe without psychotic features. 2. Alcohol dependence/alcohol use disorder, severe, dependence. 3. Generalized anxiety disorder. 4. Alcohol withdrawal, noncomplicated  Pertinent findings on examination today: 1. Continued depressive symptoms. 2. Continued fleeting  suicidal ideation. 3. Moderate withdrawal symptoms from alcohol.  Plan: 1. Continue folic acid 1 mg p.o. daily for nutritional supplementation. 2. Continue gabapentin 300 mg p.o. 3 times daily for alcohol dependence. 3. Continue hydroxyzine 25 mg p.o. every 6 hours as needed anxiety. 4. Continue ibuprofen 400 mg p.o. every 6 hours as needed headache. 5. Continue Imodium 2 to 4 mg p.o. as needed diarrhea or loose stool. 6. Continue lorazepam 1 mg p.o. every 6 hours as needed a CIWA greater than 10. 7. Continue Zofran 4 mg p.o. every 6 hours as needed nausea or vomiting. 8. Continue paroxetine 20 mg p.o. daily for depression and anxiety. 9. Start Cymbalta 30 mg  p.o. daily and titrate up for depression and anxiety. 10. Start Campral 666 mg p.o. twice daily to decrease alcohol cravings. 11. Continue thiamine 100 mg p.o. daily for nutritional supplementation. 12. Continue trazodone 50 mg p.o. nightly as needed insomnia. 13. Disposition planning-in progress.  Sharma Covert, MD 01/20/2021, 9:58 AM

## 2021-01-20 NOTE — Progress Notes (Signed)
   01/20/21 0621  Vital Signs  Temp 98.3 F (36.8 C)  Temp Source Oral  Pulse Rate 72  Pulse Rate Source Monitor  BP 97/69  BP Location Left Arm  BP Method Automatic  Patient Position (if appropriate) Sitting  Oxygen Therapy  SpO2 100 %   D: Patient admits to passive SI without a plan. Pt. Denies HI/AVH. Patient rated anxiety 7//10 and depression 8/10.  A:  Patient took scheduled medicine. Pt. Given 25 mg of Vistaril for anxiety.  Pt.'s morning CIWA  Was 8.  Support and encouragement provided Routine safety checks conducted every 15 minutes. Patient  Informed to notify staff with any concerns.  R:  Safety maintained.

## 2021-01-20 NOTE — BH Assessment (Signed)
Pt stopped CSW on the unit and asked if she would be here tomorrow. CSW stated she would. Pt stated that she is interested in substance use treatment but was unsure if she wanted inpatient or outpatient. CSW encouraged pt to think about it tonight and shared that they could meet in the morning to discuss pt's decision.   Toney Reil, [place] Worker [employer]

## 2021-01-20 NOTE — Progress Notes (Signed)
Patient's hearing aid charger and hearing aid case located behind nurse's station.

## 2021-01-20 NOTE — Progress Notes (Signed)
Patient concern lights not working properly in her room. She wants to find a long term treatment program.

## 2021-01-20 NOTE — Progress Notes (Addendum)
   01/20/21 2149  Psych Admission Type (Psych Patients Only)  Admission Status Voluntary  Psychosocial Assessment  Patient Complaints Anxiety;Depression;Other (Comment);Crying spells (guilt over past mistakes)  Eye Contact Fair  Facial Expression Pensive;Anxious;Sad  Affect Depressed;Sad;Anxious  Speech Logical/coherent  Interaction Assertive  Motor Activity Other (Comment) (wnl)  Appearance/Hygiene Unremarkable  Behavior Characteristics Cooperative;Anxious  Mood Depressed;Anxious;Guilty  Thought Process  Coherency WDL  Content Blaming self  Delusions None reported or observed  Perception WDL  Hallucination None reported or observed  Judgment Poor  Confusion None  Danger to Self  Current suicidal ideation? Passive  Self-Injurious Behavior No self-injurious ideation or behavior indicators observed or expressed   Agreement Not to Harm Self Yes  Description of Agreement verbal contract  Danger to Others  Danger to Others None reported or observed   Pt endorses passive SI but contracts for safety. Rates anxiety 7/10  And depression 5/10. Blaming self and feeling guilt. Pt tearful during assessment. Pt educated on how to counter negative talk with concrete positive thoughts. Pt encouraged to work on her needs while here. Pt c/o constipation from medication. Rates pain 6/10 from headache.

## 2021-01-21 DIAGNOSIS — F1010 Alcohol abuse, uncomplicated: Secondary | ICD-10-CM

## 2021-01-21 DIAGNOSIS — F332 Major depressive disorder, recurrent severe without psychotic features: Principal | ICD-10-CM

## 2021-01-21 HISTORY — DX: Alcohol abuse, uncomplicated: F10.10

## 2021-01-21 MED ORDER — HYDROXYZINE HCL 25 MG PO TABS
25.0000 mg | ORAL_TABLET | Freq: Three times a day (TID) | ORAL | Status: DC | PRN
  Administered 2021-01-21: 25 mg via ORAL

## 2021-01-21 MED ORDER — PAROXETINE HCL 10 MG PO TABS
10.0000 mg | ORAL_TABLET | Freq: Every day | ORAL | Status: AC
  Administered 2021-01-22: 10 mg via ORAL
  Filled 2021-01-21 (×2): qty 1

## 2021-01-21 MED ORDER — TRAZODONE HCL 50 MG PO TABS
50.0000 mg | ORAL_TABLET | Freq: Every evening | ORAL | Status: DC | PRN
  Administered 2021-01-21 – 2021-01-22 (×2): 50 mg via ORAL
  Filled 2021-01-21 (×2): qty 1

## 2021-01-21 MED ORDER — MAGNESIUM CITRATE PO SOLN
0.5000 | Freq: Once | ORAL | Status: AC | PRN
  Administered 2021-01-22: 0.5 via ORAL

## 2021-01-21 NOTE — Progress Notes (Signed)
Urine specimen cup provided to the pt. Education provided.

## 2021-01-21 NOTE — BHH Group Notes (Signed)
Type of Therapy and Topic:  Group Therapy:  Positive Affirmations   Participation Level:  Active  Description of Group: This group addressed positive affirmation toward self and others. Patients went around the room and identified two positive things about themselves and two positive things about a peer in the room. Patients reflected on how it felt to share something positive with others, to identify positive things about themselves, and to hear positive things from others. Patients were encouraged to have a daily reflection of positive characteristics or circumstances. Therapeutic Goals 1. Patient will verbalize two of their positive qualities 2. Patient will demonstrate empathy for others by stating two positive qualities about a peer in the group 3. Patient will verbalize their feelings when voicing positive self affirmations and when voicing positive affirmations of others 4. Patients will discuss the potential positive impact on their wellness/recovery of focusing on positive traits of self and others. Summary of Patient Progress:  The Pt attended group and accepted the worksheets that were provided.   Therapeutic Modalities Cognitive Behavioral Therapy Motivational Interviewing

## 2021-01-21 NOTE — Progress Notes (Addendum)
Patient rates depression 5/10, anxiety 6/10, and hopelessness 6/10. Patient reports passive suicidal ideations with no plan or intent. She verbally contracts for safety. She reports fair sleep and states that she had wired dreams last night. She reports having a fair appetite today. She reports having withdrawal symptoms: agitation, irritability and cravings. Her goal for today "figure out what to do when I'm discharged." CSW informed that the pt would like to discuss discharge plans. Patient c/o constipation since Monday. She requested medication for constipation. She was given milk of magnesia. Patient encouraged to speak with MD about taking a stool softener or stronger laxative if she does not get relief from the MOM. Patient encouraged to increase fluid and fiber intake.   Orders reviewed.  Vital signs reviewed. Verbal support provided. 15 minute checks performed for safety.   Patient compliant with treatment plan.

## 2021-01-21 NOTE — BHH Counselor (Signed)
CSW spoke with Debra Rhodes about residential treatment centers.  Debra Rhodes states that she is thinking about going to one that is out of state but is not sure at this time.  Debra Rhodes states that she is insterested in [place] in [place], [place], and [facility] in [place].  Debra Rhodes accepted the pamphlets and brochures for several treatment centers.  Debra Rhodes states she will review these centers and will inform a CSW of her choice tomorrow.  Debra Rhodes is also willing to call and facilitate some of her own treatment.  CSW will continue to work with Debra Rhodes on further discharge planning.

## 2021-01-21 NOTE — Progress Notes (Signed)
Adult Psychoeducational Group Note  Date:  01/21/2021 Time:  9:53 AM  Group Topic/Focus:  Goals Group:   The focus of this group is to help patients establish daily goals to achieve during treatment and discuss how the patient can incorporate goal setting into their daily lives to aide in recovery.  Participation Level:  Active  Participation Quality:  Appropriate  Affect:  Appropriate  Cognitive:  Appropriate  Insight: Appropriate  Engagement in Group:  Engaged  Modes of Intervention:  Discussion  Additional Comments:  Patient attended group and participated.   Delicia W [place]ah 11/25/1094, 9:53 AM

## 2021-01-21 NOTE — Progress Notes (Signed)
   01/21/21 2302  Psych Admission Type (Psych Patients Only)  Admission Status Voluntary  Psychosocial Assessment  Patient Complaints Anxiety;Depression  Eye Contact Fair  Facial Expression Sad  Affect Depressed;Anxious  Speech Logical/coherent  Interaction Assertive  Motor Activity Fidgety  Appearance/Hygiene Unremarkable  Behavior Characteristics Cooperative;Appropriate to situation;Anxious  Mood Depressed;Anxious;Pleasant  Thought Process  Coherency WDL  Content WDL  Delusions None reported or observed  Perception WDL  Hallucination None reported or observed  Judgment Poor  Confusion None  Danger to Self  Current suicidal ideation? Passive  Self-Injurious Behavior Some self-injurious ideation observed or expressed.  No lethal plan expressed   Agreement Not to Harm Self Yes  Description of Agreement verbal contract  Danger to Others  Danger to Others None reported or observed   Pt endorses passive SI but contracts for safety. Rates anxiety 6/10 and depression 5/10. "I feel better today than yesterday." Pt working on discharge planning. Wants to stay in [place] for impatient treatment to be close to her daughters who are in [place]. Pt c/o constipation. Educated that trazodone can cause constipation. Pt says she has taken this medication for a while but eats more fiber-rich foods at home so it is not a problem.

## 2021-01-21 NOTE — Progress Notes (Signed)
Recreation Therapy Notes  Date: 01/21/2021 Time: 9:30a Location: [address] Day[address]  Group Topic: Stress Management  Goal Area(s) Addresses:  Patient will identify positive stress management techniques. Patient will identify benefits of using stress management post d/c.  Behavioral Response: Engaged  Intervention: Worksheet, Group brain storming  Activity :  Mind Map.  Patient was provided a blank template of a diagram with 32 blank boxes in a tiered system, branching from the center (similar to a bubble chart). LRT directed patients to label the middle of the diagram "Stress" and consider 8 different sources of stress in their day to day life. Pt were directed to record their stressors in the 2nd tier boxes closest to the center. Patients were to then come up with 3 effective techniques to address each identified area in the remaining boxes stemming from a particular stressor. Pts were encouraged to share ideas with one another and ask for suggestions of peers and Probation officer when stuck on a certain category of stress.  Education:  Stress Management, Discharge Planning.   Education Outcome: Acknowledges Education  Clinical Observations/Feedback: Pt was interactive and attentive in group session. Pt shared personal struggles with the group. Pt expressed that it is hard for them to ask for help because they do not feel worthy of it. Pt was supportive of others and offered suggestions to address challenges. Pt was appropriate and on-task throughout activity, successfully filling in all blank spaces.    Fabiola Backer, LRT/CTRS Bjorn Loser Duwane Gewirtz 01/21/2021, 12:32 PM

## 2021-01-21 NOTE — Progress Notes (Signed)
[facility] MD Progress Note  01/21/2021 3:46 PM Debra Rhodes  MRN:  [id number]   Chief Complaint: suicidal ideation and depression  Subjective:  Debra Rhodes is a 60 y.o. female with a history of alcohol dependence, major depression, and anxiety who presented to the [facility] emergency department on 01/17/2021 secondary to excessive drinking over 3 days prior to admission, decreased sleep, and suicidal ideation. The patient is currently on Hospital Day 3.   Chart Review from last 24 hours:  The patient's chart was reviewed and nursing notes were reviewed. The patient's case was discussed in multidisciplinary team rounds. Per nursing notes, the patient has had passive SI but could contract for safety. She has denied AVH or HI. Per [facility] she has been compliant with scheduled medications and did receive Vistaril X2 yesterday for anxiety. She received MOM X 1 yesterday and X1 today for constipation.   Information Obtained Today During Patient Interview: The patient was seen and evaluated on the unit. On assessment today the patient reports that "each day I am getting better" but she continues to endorse depressed mood. She reports one episode of SI with thoughts that she could use a utensil to cut herself earlier today, but she states the thought was passing. She denies intent or plan to harm herself and can contract for safety on the unit. She denies HI or AVH. She denies current cravings for alcohol or signs of withdrawal. She is interested in residential rehab options after discharge. She reports low appetite that is improving and fair sleep. She voices no physical complaints. She denies medication side-effects.   Principal Problem: MDD (major depressive disorder), single episode, severe , no psychosis ([place]) Diagnosis: Principal Problem:   MDD (major depressive disorder), single episode, severe , no psychosis ([place]) Active Problems:   Alcohol abuse  Total Time Spent in Direct Patient  Care:  I personally spent 30 minutes on the unit in direct patient care. The direct patient care time included face-to-face time with the patient, reviewing the patient's chart, communicating with other professionals, and coordinating care. Greater than 50% of this time was spent in counseling or coordinating care with the patient regarding goals of hospitalization, psycho-education, and discharge planning needs.  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   Procedure: TOTAL KNEE ARTHROPLASTY;  Surgeon: Paralee Cancel, MD;  Location: [facility] ORS;  Service: Orthopedics;  Laterality: Right;  70 mins   Family History:  Family History  Problem Relation Age of Onset   Anxiety disorder Sister    Depression Brother    Schizophrenia Brother    Suicidality Brother    Anxiety disorder Brother    Alcohol abuse Sister    Alcohol abuse Brother    Alcohol abuse Brother    Family Psychiatric  History: see admission H&P  Social History:  Social History   Substance and Sexual Activity  Alcohol Use Yes   Comment: binge drinking- was in recovery for past 4 years     Social History   Substance and Sexual Activity  Drug Use No    Social History  Socioeconomic History   Marital status: Divorced    Spouse name: Not on file   Number of children: Not on file   Years of education: Not on file   Highest education level: Not on file  Occupational History   Not on file  Tobacco Use   Smoking status: Never Smoker   Smokeless tobacco: Never Used  Vaping Use   Vaping Use: Never used  Substance and Sexual Activity   Alcohol use: Yes    Comment:  binge drinking- was in recovery for past 4 years   Drug use: No   Sexual activity: Not Currently  Other Topics Concern   Not on file  Social History Narrative   Not on file   Social Determinants of Health   Financial Resource Strain: Not on file  Food Insecurity: Not on file  Transportation Needs: Not on file  Physical Activity: Not on file  Stress: Not on file  Social Connections: Not on file   Sleep: Fair  Appetite:  Fair  Current Medications: Current Facility-Administered Medications  Medication Dose Route Frequency Provider Last Rate Last Admin   acamprosate (CAMPRAL) tablet 666 mg  666 mg Oral BID WC Sharma Covert, MD   666 mg at 01/21/21 6283   acetaminophen (TYLENOL) tablet 650 mg  650 mg Oral Q6H PRN Rankin, Shuvon B, NP   650 mg at 01/20/21 2200   alum & mag hydroxide-simeth (MAALOX/MYLANTA) 200-200-20 MG/5ML suspension 30 mL  30 mL Oral Q4H PRN Rankin, Shuvon B, NP       DULoxetine (CYMBALTA) DR capsule 30 mg  30 mg Oral Daily Sharma Covert, MD   30 mg at 15/17/61 6073   folic acid (FOLVITE) tablet 1 mg  1 mg Oral Daily Ethelene Hal, NP   1 mg at 01/21/21 7106   gabapentin (NEURONTIN) capsule 300 mg  300 mg Oral TID Lindell Spar I, NP   300 mg at 01/21/21 1234   hydrOXYzine (ATARAX/VISTARIL) tablet 25 mg  25 mg Oral Q6H PRN Ethelene Hal, NP   25 mg at 01/20/21 2200   ibuprofen (ADVIL) tablet 400 mg  400 mg Oral Q6H PRN Ethelene Hal, NP   400 mg at 01/19/21 1131   loperamide (IMODIUM) capsule 2-4 mg  2-4 mg Oral PRN Ethelene Hal, NP       LORazepam (ATIVAN) tablet 1 mg  1 mg Oral Q6H PRN Ethelene Hal, NP   1 mg at 01/18/21 1712   magnesium hydroxide (MILK OF MAGNESIA) suspension 30 mL  30 mL Oral Daily PRN Connye Burkitt, NP   30 mL at 01/21/21 1235   multivitamin with minerals tablet 1 tablet  1 tablet Oral Daily Ethelene Hal, NP   1 tablet at 01/21/21 2694   ondansetron (ZOFRAN-ODT)  disintegrating tablet 4 mg  4 mg Oral Q6H PRN Ethelene Hal, NP   4 mg at 01/18/21 1713   PARoxetine (PAXIL) tablet 20 mg  20 mg Oral Daily Ethelene Hal, NP   20 mg at 01/21/21 8546   thiamine tablet 100 mg  100 mg Oral Daily Ethelene Hal, NP   100 mg at 01/21/21 2703   traZODone (DESYREL) tablet 50 mg  50 mg Oral QHS,MR X 1 Ethelene Hal, NP   50 mg at 01/20/21 2200    Lab Results: No results found for this or any previous visit (from the past 48 hour(s)).  Blood Alcohol level:  Lab  Results  Component Value Date   ETH 267 (H) 62/13/0865    Metabolic Disorder Labs: Lab Results  Component Value Date   HGBA1C 5.2 01/19/2021   MPG 102.54 01/19/2021   No results found for: PROLACTIN Lab Results  Component Value Date   CHOL 300 (H) 01/19/2021   TRIG 41 01/19/2021   HDL 120 01/19/2021   CHOLHDL 2.5 01/19/2021   VLDL 8 01/19/2021   LDLCALC 172 (H) 01/19/2021    Physical Findings: AIMS: Facial and Oral Movements Muscles of Facial Expression: None, normal Lips and Perioral Area: None, normal Jaw: None, normal Tongue: None, normal,Extremity Movements Upper (arms, wrists, hands, fingers): None, normal Lower (legs, knees, ankles, toes): None, normal, Trunk Movements Neck, shoulders, hips: None, normal, Overall Severity Severity of abnormal movements (highest score from questions above): None, normal Incapacitation due to abnormal movements: None, normal Patient's awareness of abnormal movements (rate only patient's report): No Awareness, Dental Status Current problems with teeth and/or dentures?: No Does patient usually wear dentures?: No  CIWA:  CIWA-Ar Total: 1   Musculoskeletal: Strength & Muscle Tone: within normal limits Gait & Station: normal Patient leans: N/A  Psychiatric Specialty Exam: Physical Exam Vitals reviewed.  HENT:     Head: Normocephalic.  Pulmonary:     Effort: Pulmonary effort is normal.  Neurological:      Mental Status: She is alert.     Review of Systems  Respiratory: Negative for shortness of breath.   Cardiovascular: Negative for chest pain.  Gastrointestinal: Negative for nausea and vomiting.    Blood pressure 120/88, pulse 71, temperature 98 F (36.7 C), temperature source Oral, resp. rate 16, height 5\' 4"  (1.626 m), weight 54 kg, SpO2 100 %.Body mass index is 20.43 kg/m.  General Appearance: adequate hygiene, well engaged with examiner, casually dressed  Eye Contact:  Good  Speech:  Clear and Coherent and Normal Rate  Volume:  Normal  Mood:  Anxious and Dysphoric  Affect:  Congruent  Thought Process:  Goal Directed and Linear  Orientation:  Full (Time, Place, and Person)  Thought Content:  Denies AVH, paranoia, or delusions; no acute psychosis noted on exam  Suicidal Thoughts:  Passive thoughts of hurting herself with a utensil but contracts for safety on the unit  Homicidal Thoughts:  Denied  Memory:  Immediate;   Good  Judgement:  Fair  Insight:  Fair  Psychomotor Activity:  Normal  Concentration:  Concentration: Fair and Attention Span: Fair  Recall:  [employer] of Knowledge:  Good  Language:  Good  Akathisia:  Negative  Assets:  Communication Skills Desire for Improvement Resilience  ADL's:  Intact  Cognition:  WNL  Sleep:  Number of Hours: 6.75   Treatment Plan Summary: Diagnoses / Active Problems: MDD recurrent severe without psychotic features (r/o alcohol induced depressive d/o) Alcohol use d/o GAD  PLAN: 1. Safety and Monitoring:  -- Voluntary admission to inpatient psychiatric unit for safety, stabilization and treatment  -- Daily contact with patient to assess and evaluate symptoms and progress in treatment  -- Patient's case to be discussed in multi-disciplinary team meeting  -- Observation Level : q15 minute checks  -- Vital signs:  q12 hours  -- Precautions: suicide  2. Psychiatric Diagnoses and Treatment:   MDD recurrent severe without  psychotic features (r/o alcohol induced depressive d/o)  GAD  -- Continue to cross-taper off Paxil and onto Cymbalta (will reduce Paxil to 10mg  daily tomorrow X1 dose and stop - continue Cymbalta 30mg  daily  titrating up as tolerated)  -- Continue Vistaril 25mg  q6 hours PRN anxiety  -- Continue Trazodone 50mg  qhs PRN insomnia  -- Encouraged patient to participate in unit milieu and in scheduled group therapies   -- Short Term Goals: Ability to identify changes in lifestyle to reduce recurrence of condition will improve and Ability to identify and develop effective coping behaviors will improve  -- Long Term Goals: Improvement in symptoms so as ready for discharge   Alcohol use d/o  -- ETOH 267 on admission  -- Continue CIWA protocol for withdrawal monitoring with oral thiamine, folic acid, and MVI replacement (recent CIWA scores: 8,0,2,4,1,1)  -- Continue Campral 666mg  bid  -- Continue Neurontin 300mg  tid  -- Elevated anion gap on admission - repeat CMP tomorrow for monitoring  -- Social work to discussion residential rehab options with the patient   -- Short Term Goals: Ability to identify triggers associated with substance abuse/mental health issues will improve  -- Long Term Goals: Improvement in symptoms so as ready for discharge   3. Medical Issues Being Addressed:   Hypercholesterolemia   -- Patient prefers to work on diet and exercise changes prior to starting a statin - will need to see PCP for monitoring and management after discharge   Leukopenia on admission (WBC 3.9)  -- Repeating CBC tomorrow for trending   Quant HCG 7.3 on admission  -- Repeating quant HCG; urine pregnancy test negative   Moderate blood on UA  -- Clean catch UA repeat pending    Constipation  -- MOM PRN given - will order Mag citrate 1/2 bottle PRN if MOM ineffective  4. Discharge Planning:   -- Social work and case management to assist with discharge planning and identification of hospital follow-up  needs prior to discharge  -- Estimated LOS: 2-3 days  -- Discharge Concerns: Need to establish a safety plan; Medication compliance and effectiveness  -- Discharge Goals: Return home with outpatient referrals for mental health follow-up including medication management/psychotherapy  Harlow Asa, MD, FAPA 01/21/2021, 3:46 PM

## 2021-01-22 LAB — URINALYSIS, COMPLETE (UACMP) WITH MICROSCOPIC
Bacteria, UA: NONE SEEN
Bilirubin Urine: NEGATIVE
Glucose, UA: NEGATIVE mg/dL
Ketones, ur: NEGATIVE mg/dL
Leukocytes,Ua: NEGATIVE
Nitrite: NEGATIVE
Protein, ur: NEGATIVE mg/dL
Specific Gravity, Urine: 1.008 (ref 1.005–1.030)
pH: 7 (ref 5.0–8.0)

## 2021-01-22 LAB — CBC WITH DIFFERENTIAL/PLATELET
Abs Immature Granulocytes: 0.01 10*3/uL (ref 0.00–0.07)
Basophils Absolute: 0 10*3/uL (ref 0.0–0.1)
Basophils Relative: 1 %
Eosinophils Absolute: 0.2 10*3/uL (ref 0.0–0.5)
Eosinophils Relative: 4 %
HCT: 41.7 % (ref 36.0–46.0)
Hemoglobin: 13.8 g/dL (ref 12.0–15.0)
Immature Granulocytes: 0 %
Lymphocytes Relative: 37 %
Lymphs Abs: 1.4 10*3/uL (ref 0.7–4.0)
MCH: 31.1 pg (ref 26.0–34.0)
MCHC: 33.1 g/dL (ref 30.0–36.0)
MCV: 93.9 fL (ref 80.0–100.0)
Monocytes Absolute: 0.4 10*3/uL (ref 0.1–1.0)
Monocytes Relative: 10 %
Neutro Abs: 1.9 10*3/uL (ref 1.7–7.7)
Neutrophils Relative %: 48 %
Platelets: 198 10*3/uL (ref 150–400)
RBC: 4.44 MIL/uL (ref 3.87–5.11)
RDW: 12.7 % (ref 11.5–15.5)
WBC: 3.9 10*3/uL — ABNORMAL LOW (ref 4.0–10.5)
nRBC: 0 % (ref 0.0–0.2)

## 2021-01-22 LAB — COMPREHENSIVE METABOLIC PANEL
ALT: 30 U/L (ref 0–44)
AST: 40 U/L (ref 15–41)
Albumin: 4.1 g/dL (ref 3.5–5.0)
Alkaline Phosphatase: 42 U/L (ref 38–126)
Anion gap: 8 (ref 5–15)
BUN: 13 mg/dL (ref 6–20)
CO2: 26 mmol/L (ref 22–32)
Calcium: 9.4 mg/dL (ref 8.9–10.3)
Chloride: 100 mmol/L (ref 98–111)
Creatinine, Ser: 0.69 mg/dL (ref 0.44–1.00)
GFR, Estimated: >60 mL/min (ref >60)
Glucose, Bld: 110 mg/dL — ABNORMAL HIGH (ref 70–99)
Potassium: 4 mmol/L (ref 3.5–5.1)
Sodium: 134 mmol/L — ABNORMAL LOW (ref 135–145)
Total Bilirubin: 0.9 mg/dL (ref 0.3–1.2)
Total Protein: 6.8 g/dL (ref 6.5–8.1)

## 2021-01-22 LAB — HCG, QUANTITATIVE, PREGNANCY: hCG, Beta Chain, Quant, S: 8 m[IU]/mL — ABNORMAL HIGH (ref <5)

## 2021-01-22 MED ORDER — DOCUSATE SODIUM 100 MG PO CAPS
ORAL_CAPSULE | ORAL | Status: AC
  Filled 2021-01-22: qty 1

## 2021-01-22 MED ORDER — DOCUSATE SODIUM 100 MG PO CAPS
100.0000 mg | ORAL_CAPSULE | Freq: Every day | ORAL | Status: DC
  Administered 2021-01-22 – 2021-01-23 (×2): 100 mg via ORAL
  Filled 2021-01-22 (×3): qty 1

## 2021-01-22 MED ORDER — BIOTENE DRY MOUTH MT LIQD
15.0000 mL | OROMUCOSAL | Status: DC | PRN
  Administered 2021-01-23 – 2021-01-24 (×2): 15 mL via OROMUCOSAL
  Filled 2021-01-22: qty 237

## 2021-01-22 MED ORDER — TRAZODONE HCL 50 MG PO TABS
50.0000 mg | ORAL_TABLET | Freq: Every evening | ORAL | Status: DC | PRN
  Administered 2021-01-23 (×2): 50 mg via ORAL
  Filled 2021-01-22 (×2): qty 1

## 2021-01-22 NOTE — Progress Notes (Addendum)
   01/22/21 1300  Psych Admission Type (Psych Patients Only)  Admission Status Voluntary  Psychosocial Assessment  Patient Complaints Anxiety  Eye Contact Fair  Facial Expression Sad  Affect Depressed;Anxious  Speech Logical/coherent  Interaction Assertive  Motor Activity Fidgety  Appearance/Hygiene Unremarkable  Behavior Characteristics Cooperative;Appropriate to situation  Mood Anxious  Thought Process  Coherency WDL  Content WDL  Delusions None reported or observed  Perception WDL  Hallucination None reported or observed  Judgment Poor  Confusion None  Danger to Self  Current suicidal ideation? Passive  Self-Injurious Behavior Some self-injurious ideation observed or expressed.  No lethal plan expressed   Agreement Not to Harm Self Yes  Description of Agreement verbal contract  Danger to Others  Danger to Others None reported or observed   D. Pt presents with an anxious affect/mood, pleasant upon approach. Pt continues to complain of constipation Pt observed interacting well with peers and staff-  Pt currently denies SI/HI and AVH  A. Labs and vitals monitored. Pt administered scheduled meds and given magnesium citrate for constipation. Pt supported emotionally and encouraged to express concerns and ask questions.   R. Pt remains safe with 15 minute checks. Will continue POC.

## 2021-01-22 NOTE — [facility] Suicide Risk Assessment (Signed)
[place] INPATIENT:  Family/Significant Other Suicide Prevention Education  Suicide Prevention Education:  Education Completed; Debra Rhodes Rhodes [phone] ,  (name of family member/significant other) has been identified by the patient as the family member/significant other with whom the patient will be residing, and identified as the person(s) who will aid the patient in the event of a mental health crisis (suicidal ideations/suicide attempt).  With written consent from the patient, the family member/significant other has been provided the following suicide prevention education, prior to the and/or following the discharge of the patient.  The suicide prevention education provided includes the following:  Suicide risk factors  Suicide prevention and interventions  National Suicide Hotline telephone number  [facility] assessment telephone number  [facility] Emergency Assistance [place] and/or Residential Mobile Crisis Unit telephone number  Request made of family/significant other to:  Remove weapons (e.g., guns, rifles, knives), all items previously/currently identified as safety concern.    Remove drugs/medications (over-the-counter, prescriptions, illicit drugs), all items previously/currently identified as a safety concern.  The family member/significant other verbalizes understanding of the suicide prevention education information provided.  The family member/significant other agrees to remove the items of safety concern listed above.  CSW asked Debra Rhodes Rhodes he felt led pt to the hospital. Debra Rhodes stated, "she made a bad decision to get back with a boyfriend. She realized that this was an unhealthy decision but did not know how to break up with him and this caused her to relapse on alcohol. She got depressed and began saying she did not want to live.". Reports no weapons in pts home. Reports his only safety concern is if pt relapses on alcohol "it won't be  pretty". Debra Rhodes believe that pt does not need to be alone at discharge. He has discussed this with pt and she agreed that she does not want to be alone and she would ask her daughters to stay with her.    Debra Rhodes 01/22/2021, 4:00 PM

## 2021-01-22 NOTE — Progress Notes (Signed)
Adult Psychoeducational Group Note  Date:  01/22/2021 Time:  10:10 AM  Group Topic/Focus:  Developing a Wellness Toolbox:   The focus of this group is to help patients develop a "wellness toolbox" with skills and strategies to promote recovery upon discharge.  Participation Level:  Active  Participation Quality:  Appropriate  Affect:  Appropriate  Cognitive:  Appropriate  Insight: Appropriate  Engagement in Group:  Engaged  Modes of Intervention:  Discussion  Additional Comments:  Patient attended morning orientation and Wellness group and participated.   Delicia W [place] 07/22/1193, 10:10 AM

## 2021-01-22 NOTE — Progress Notes (Signed)
   01/22/21 2135  Psych Admission Type (Psych Patients Only)  Admission Status Voluntary  Psychosocial Assessment  Patient Complaints Anxiety  Eye Contact Fair  Facial Expression Other (Comment) (Pleasant)  Affect Appropriate to circumstance  Speech Logical/coherent  Interaction Assertive  Motor Activity Other (Comment) (WDL)  Appearance/Hygiene Unremarkable  Behavior Characteristics Appropriate to situation  Mood Pleasant  Thought Process  Coherency WDL  Content WDL  Delusions None reported or observed  Perception WDL  Hallucination None reported or observed  Judgment Poor  Confusion None  Danger to Self  Current suicidal ideation? Denies  Self-Injurious Behavior No self-injurious ideation or behavior indicators observed or expressed   Agreement Not to Harm Self Yes  Description of Agreement verbal contract  Danger to Others  Danger to Others None reported or observed

## 2021-01-22 NOTE — Progress Notes (Signed)
[facility] MD Progress Note  01/22/2021 7:25 AM Debra Rhodes  MRN:  [id number]   Chief Complaint: suicidal ideation and depression  Subjective:  Debra Rhodes is a 60 y.o. female with a history of alcohol dependence, major depression, and anxiety who presented to the [facility] emergency department on 01/17/2021 secondary to excessive drinking over 3 days prior to admission, decreased sleep, and suicidal ideation. The patient is currently on Hospital Day 4.   Chart Review from last 24 hours:  The patient's chart was reviewed and nursing notes were reviewed. The patient's case was discussed in multidisciplinary team rounds. Per nursing notes, the patient continues to have passive SI but can contract for safety. She attended groups. She has denied alcohol withdrawal symptoms. Per [facility] she has been compliant with scheduled medications. She did receive Vistaril X1 for anxiety and Trazodone X1 for sleep. She received MOM X1 for constipation.   Information Obtained Today During Patient Interview: The patient was seen and evaluated on the unit. On assessment today the patient states that her mood is brighter today but she is still ruminative about her aftercare plans once discharged. She remains hopeful to go to a residential rehab program after discharge. She denies current cravings for alcohol or signs of withdrawal. She denies SI, intent or plan today, and denies HI or AVH. She has been constipated and did take some mag citrate today without relief yet. She is passing gas and denies current N/V, stool leakage, or abdominal pains. I discussed the positive hemoglobin in her urine and she denies current signs of dysuria, hematuria, vaginal bleeding, urinary urgency or frequency. We discussed her positive Beta HCG and she states she had a uterine ablation years ago and has not had a period in 22 years. She is s/p BTL and is postmenopausal. She states that she did not fall asleep easily last night  secondary to ruminating worry thoughts but her appetite is improving despite her constipation. She c/o dry mouth and we discussed that several of her medications could be causing this. She is open to biotene and increase fluid intake and does not wish to make medication changes at this time. She states she "felt a little off balance" earlier today but this resolved with ambulation. We discussed that given her cross-taper off Paxil and onto Cymbalta, her alcohol detox, and her start of Campral and Neurontin that she could have nonspecific side-effects with these changes. She agrees to monitor.   Principal Problem: MDD (major depressive disorder), recurrent severe, without psychosis ([place]) Diagnosis: Principal Problem:   MDD (major depressive disorder), recurrent severe, without psychosis ([place]) Active Problems:   Alcohol abuse  Total Time Spent in Direct Patient Care:  I personally spent 35 minutes on the unit in direct patient care. The direct patient care time included face-to-face time with the patient, reviewing the patient's chart, communicating with other professionals, and coordinating care. Greater than 50% of this time was spent in counseling or coordinating care with the patient regarding goals of hospitalization, psycho-education, and discharge planning needs.  Past Psychiatric History: see admission H&P  Past Medical History:  Past Medical History:  Diagnosis Date  . Anxiety   . Depression   . PONV (postoperative nausea and vomiting)     Past Surgical History:  Procedure Laterality Date  . AUGMENTATION MAMMAPLASTY Bilateral   . BACK SURGERY    . CESAREAN SECTION    . FRACTURE SURGERY    . 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 Anxiety disorder Sister   . Depression  Brother   . Schizophrenia Brother   . Suicidality Brother   . Anxiety disorder Brother   . Alcohol abuse Sister   . Alcohol abuse Brother   . Alcohol abuse Brother    Family Psychiatric  History: see admission H&P  Social History:  Social History   Substance and Sexual Activity  Alcohol Use Yes   Comment: binge drinking- was in recovery for past 4 years     Social History   Substance and Sexual Activity  Drug Use No    Social History   Socioeconomic History  . Marital status: Divorced    Spouse name: Not on file  . Number of children: Not on file  . Years of education: Not on file  . Highest education level: Not on file  Occupational History  . Not on file  Tobacco Use  . Smoking status: Never Smoker  . Smokeless tobacco: Never Used  Vaping Use  . Vaping Use: Never used  Substance and Sexual Activity  . 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 Drug use: No  . Sexual activity: Not Currently  Other Topics Concern  . Not on file  Social History Narrative  . Not on file   Social Determinants of Health   Financial Resource Strain: Not on file  Food Insecurity: Not on file  Transportation Needs: Not on file  Physical Activity: Not on file  Stress: Not on file  Social Connections: Not on file   Sleep: Fair  Appetite:  Fair  Current Medications: Current Facility-Administered Medications  Medication Dose Route Frequency Provider Last Rate Last Admin  . acamprosate (CAMPRAL) tablet 666 mg  666 mg Oral BID WC Sharma Covert, MD   666 mg at 01/21/21 1639  . acetaminophen (TYLENOL) tablet 650 mg  650 mg Oral Q6H PRN Rankin, Shuvon B, NP   650 mg at 01/20/21 2200  . alum & mag hydroxide-simeth (MAALOX/MYLANTA) 200-200-20 MG/5ML suspension 30 mL  30 mL Oral Q4H PRN Rankin, Shuvon B, NP      . DULoxetine (CYMBALTA) DR capsule 30 mg  30 mg Oral Daily Sharma Covert, MD   30 mg at 01/21/21 5176  . folic acid (FOLVITE)  tablet 1 mg  1 mg Oral Daily Ethelene Hal, NP   1 mg at 01/21/21 1607  . gabapentin (NEURONTIN) capsule 300 mg  300 mg Oral TID Lindell Spar I, NP   300 mg at 01/21/21 1639  . hydrOXYzine (ATARAX/VISTARIL) tablet 25 mg  25 mg Oral TID PRN Prescilla Sours, PA-C   25 mg at 01/21/21 2205  . ibuprofen (ADVIL) tablet 400 mg  400 mg Oral Q6H PRN Ethelene Hal, NP   400 mg at 01/19/21 1131  . magnesium citrate solution 0.5 Bottle  0.5 Bottle Oral Once PRN Nelda Marseille, Amy E, MD      . magnesium hydroxide (MILK OF MAGNESIA) suspension 30 mL  30 mL Oral Daily PRN Connye Burkitt, NP   30 mL at 01/21/21 1235  . multivitamin with minerals tablet 1 tablet  1 tablet Oral Daily Ethelene Hal, NP   1 tablet at 01/21/21 3710  . PARoxetine (  PAXIL) tablet 10 mg  10 mg Oral Daily Singleton, Amy E, MD      . thiamine tablet 100 mg  100 mg Oral Daily Ethelene Hal, NP   100 mg at 01/21/21 4431  . traZODone (DESYREL) tablet 50 mg  50 mg Oral QHS PRN Harlow Asa, MD   50 mg at 01/21/21 2205    Lab Results: No results found for this or any previous visit (from the past 53 hour(s)).  Blood Alcohol level:  Lab Results  Component Value Date   ETH 267 (H) 54/00/8676    Metabolic Disorder Labs: Lab Results  Component Value Date   HGBA1C 5.2 01/19/2021   MPG 102.54 01/19/2021   No results found for: PROLACTIN Lab Results  Component Value Date   CHOL 300 (H) 01/19/2021   TRIG 41 01/19/2021   HDL 120 01/19/2021   CHOLHDL 2.5 01/19/2021   VLDL 8 01/19/2021   LDLCALC 172 (H) 01/19/2021    Physical Findings: AIMS: Facial and Oral Movements Muscles of Facial Expression: None, normal Lips and Perioral Area: None, normal Jaw: None, normal Tongue: None, normal,Extremity Movements Upper (arms, wrists, hands, fingers): None, normal Lower (legs, knees, ankles, toes): None, normal, Trunk Movements Neck, shoulders, hips: None, normal, Overall Severity Severity of abnormal  movements (highest score from questions above): None, normal Incapacitation due to abnormal movements: None, normal Patient's awareness of abnormal movements (rate only patient's report): No Awareness, Dental Status Current problems with teeth and/or dentures?: No Does patient usually wear dentures?: No  CIWA:  CIWA-Ar Total: 2   Musculoskeletal: Strength & Muscle Tone: within normal limits Gait & Station: normal Patient leans: N/A  Psychiatric Specialty Exam: Physical Exam Vitals reviewed.  HENT:     Head: Normocephalic.  Pulmonary:     Effort: Pulmonary effort is normal.  Neurological:     Mental Status: She is alert.     Review of Systems  Gastrointestinal: Positive for constipation. Negative for abdominal pain, diarrhea, nausea and vomiting.  Genitourinary: Negative for decreased urine volume, difficulty urinating, frequency, hematuria, menstrual problem and vaginal bleeding.  Neurological: Positive for light-headedness.    Blood pressure 92/68, pulse 76, temperature (!) 97.4 F (36.3 C), temperature source Oral, resp. rate 16, height 5\' 4"  (1.626 m), weight 54 kg, SpO2 100 %.Body mass index is 20.43 kg/m.  General Appearance: good hygiene, well engaged with examiner, casually dressed  Eye Contact:  Good  Speech:  Clear and Coherent and Normal Rate  Volume:  Normal  Mood:  Described as improving - appears more euthymic  Affect:  Mildly anxious  Thought Process:  Goal Directed and Linear  Orientation:  Full (Time, Place, and Person)  Thought Content:  Denies AVH, paranoia, or delusions; no acute psychosis noted on exam  Suicidal Thoughts:  Denied  Homicidal Thoughts:  Denied  Memory:  Immediate;   Good  Judgement:  Fair  Insight:  Fair  Psychomotor Activity:  Normal  Concentration:  Good  Recall:  Leigh of Knowledge:  Good  Language:  Good  Akathisia:  Negative  Assets:  Communication Skills Desire for Improvement Resilience  ADL's:  Intact  Cognition:   WNL  Sleep:  Number of Hours: 6.25   Treatment Plan Summary: Diagnoses / Active Problems: MDD recurrent severe without psychotic features (r/o alcohol induced depressive d/o) Alcohol use d/o GAD Abnormal Beta HCG Hematuria Leukopenia Hypercholesterolemia Constipation Mild hyponatremia  PLAN: 1. Safety and Monitoring:  -- Voluntary admission to inpatient psychiatric unit  for safety, stabilization and treatment  -- Daily contact with patient to assess and evaluate symptoms and progress in treatment  -- Patient's case to be discussed in multi-disciplinary team meeting  -- Observation Level : q15 minute checks  -- Vital signs:  q12 hours  -- Precautions: suicide  2. Psychiatric Diagnoses and Treatment:   MDD recurrent severe without psychotic features (r/o alcohol induced depressive d/o)  GAD  -- Reduce Paxil to 10mg  daily X1 dose today and stop - continue Cymbalta 30mg  daily titrating up as tolerated  -- Given dry mouth and constipation, will discontinue PRN Vistaril and will start biotene  -- Continue Trazodone 50mg  qhs PRN insomnia  -- Encouraged patient to participate in unit milieu and in scheduled group therapies   -- Short Term Goals: Ability to identify changes in lifestyle to reduce recurrence of condition will improve and Ability to identify and develop effective coping behaviors will improve  -- Long Term Goals: Improvement in symptoms so as ready for discharge   Alcohol use d/o  -- ETOH 267 on admission  -- Continue CIWA protocol for withdrawal monitoring with oral thiamine, folic acid, and MVI replacement (recent CIWA scores:1,1,0,2)  -- Continue Campral 666mg  bid  -- Continue Neurontin 300mg  tid  -- Elevated anion gap on admission with repeat CMP today showing that anion gap normalized at 8  -- Social work to discussion residential rehab options with the patient   -- Short Term Goals: Ability to identify triggers associated with substance abuse/mental health issues  will improve  -- Long Term Goals: Improvement in symptoms so as ready for discharge   3. Medical Issues Being Addressed:   Hypercholesterolemia   -- Patient prefers to work on diet and exercise changes prior to starting a statin - will need to see PCP for monitoring and management after discharge   Leukopenia on admission (WBC 3.9)  -- Repeat CBC shows WBC 3.9 with ANC 1900 - will need monitoring with PCP after discharge (per review of records has had WBC 3.7 in June 2020)   Quant HCG 7.3 on admission  -- Repeating quant HCG on 01/22/21 8 - I contacted Dr. Glory Rosebush with Gyn and he advised that we repeat a urine pregnancy test and repeat a quant HCG tomorrow. If the quant HCG is above 5 he states we should call them back for further recommendations.   Moderate blood on admission UA  -- Repeat UA shows small hbg on dipstick with red cells 0-5. Will send for urine culture and will need to see PCP after discharge for further w/u   Constipation  -- MOM PRN given and  Mag citrate 1/2 bottle given today - awaiting results  --  start Colace 100mg  daily and encouraged po hydration   Mild hyponatremia (Na+ 134)  -- possibly due to cross-taper off Paxil and onto Cymbalta - will repeat BMP tomorrow for monitoring  4. Discharge Planning:   -- Social work and case management to assist with discharge planning and identification of hospital follow-up needs prior to discharge  -- Estimated LOS: 2-3 days  -- Discharge Concerns: Need to establish a safety plan; Medication compliance and effectiveness  -- Discharge Goals: Return home with outpatient referrals for mental health follow-up including medication management/psychotherapy  Harlow Asa, MD, FAPA 01/22/2021, 7:25 AM

## 2021-01-22 NOTE — [facility] Counselor (Signed)
CSW sent paperwork to [facility] in [place], [place], [place]

## 2021-01-22 NOTE — Progress Notes (Signed)
   01/22/21 2132  COVID-19 Daily Checkoff  Have you had a fever (temp > 37.80C/100F)  in the past 24 hours?  No  If you have had runny nose, nasal congestion, sneezing in the past 24 hours, has it worsened? No  COVID-19 EXPOSURE  Have you traveled outside the state in the past 14 days? No  Have you been in contact with someone with a confirmed diagnosis of COVID-19 or PUI in the past 14 days without wearing appropriate PPE? No  Have you been living in the same home as a person with confirmed diagnosis of COVID-19 or a PUI (household contact)? No  Have you been diagnosed with COVID-19? No

## 2021-01-22 NOTE — Progress Notes (Signed)
[place] NOVEL CORONAVIRUS (COVID-19) DAILY CHECK-OFF SYMPTOMS - answer yes or no to each - every day NO YES  Have you had a fever in the past 24 hours?  . Fever (Temp > 37.80C / 100F) X   Have you had any of these symptoms in the past 24 hours? . New Cough .  Sore Throat  .  Shortness of Breath .  Difficulty Breathing .  Unexplained Body Aches   X   Have you had any one of these symptoms in the past 24 hours not related to allergies?   . Runny Nose .  Nasal Congestion .  Sneezing   X   If you have had runny nose, nasal congestion, sneezing in the past 24 hours, has it worsened?  X   EXPOSURES - check yes or no X   Have you traveled outside the state in the past 14 days?  X   Have you been in contact with someone with a confirmed diagnosis of COVID-19 or PUI in the past 14 days without wearing appropriate PPE?  X   Have you been living in the same home as a person with confirmed diagnosis of COVID-19 or a PUI (household contact)?    X   Have you been diagnosed with COVID-19?    X              What to do next: Answered NO to all: Answered YES to anything:   Proceed with unit schedule Follow the [facility] Inpatient Flowsheet.   

## 2021-01-22 NOTE — Progress Notes (Signed)
[facility] Notes:  (Nursing/MHT/Case Management/Adjunct)  Date:  01/22/2021  Time:  2015 Type of Therapy:  wrap up group  Participation Level:  Active  Participation Quality:  Appropriate, Attentive, Sharing and Supportive  Affect:  Appropriate  Cognitive:  Appropriate  Insight:  Improving  Engagement in Group:  Engaged  Modes of Intervention:  Clarification, Education and Support  Summary of Progress/Problems:Positive thinking and positive change were discussed.  [name] 01/22/2021, 8:55 PM

## 2021-01-23 ENCOUNTER — Inpatient Hospital Stay (HOSPITAL_COMMUNITY): Payer: BC Managed Care – PPO

## 2021-01-23 LAB — BASIC METABOLIC PANEL
Anion gap: 7 (ref 5–15)
BUN: 14 mg/dL (ref 6–20)
CO2: 30 mmol/L (ref 22–32)
Calcium: 9.2 mg/dL (ref 8.9–10.3)
Chloride: 101 mmol/L (ref 98–111)
Creatinine, Ser: 0.7 mg/dL (ref 0.44–1.00)
GFR, Estimated: >60 mL/min (ref >60)
Glucose, Bld: 91 mg/dL (ref 70–99)
Potassium: 4.2 mmol/L (ref 3.5–5.1)
Sodium: 138 mmol/L (ref 135–145)

## 2021-01-23 LAB — PREGNANCY, URINE: Preg Test, Ur: NEGATIVE

## 2021-01-23 LAB — HCG, QUANTITATIVE, PREGNANCY: hCG, Beta Chain, Quant, S: 8 m[IU]/mL — ABNORMAL HIGH (ref <5)

## 2021-01-23 MED ORDER — DULOXETINE HCL 20 MG PO CPEP
40.0000 mg | ORAL_CAPSULE | Freq: Every day | ORAL | Status: DC
  Administered 2021-01-23 – 2021-01-24 (×2): 40 mg via ORAL
  Filled 2021-01-23 (×3): qty 2

## 2021-01-23 MED ORDER — DULOXETINE HCL 20 MG PO CPEP: 40.0000 mg | ORAL_CAPSULE | Freq: Every day | ORAL | Status: DC

## 2021-01-23 NOTE — Progress Notes (Signed)
[place] Group Notes:  (Nursing/MHT/Case Management/Adjunct)  Date:  01/23/2021  Time:  2030  Type of Therapy:  wrap up group  Participation Level:  Active  Participation Quality:  Appropriate, Attentive, Sharing and Supportive  Affect:  Appropriate  Cognitive:  Appropriate  Insight:  Improving  Engagement in Group:  Engaged  Modes of Intervention:  Clarification, Education and Support  Summary of Progress/Problems: Positive thinking and self-care were discussed.   [name] 01/23/2021, 9:29 PM

## 2021-01-23 NOTE — Progress Notes (Signed)
Writer called [facility] [place] at [phone] along with Dr. Nelda Marseille. [place] technician stated that she will call nurse with a time for the [place] to be completed as they are backed up at this time.

## 2021-01-23 NOTE — Progress Notes (Signed)
[facility] MD Progress Note  01/23/2021 7:11 AM Debra Rhodes  MRN:  [id number]   Chief Complaint: suicidal ideation and depression  Subjective:  ADRIEANA Rhodes is a 60 y.o. female with a history of alcohol dependence, major depression, and anxiety who presented to the [facility] emergency department on 01/17/2021 secondary to excessive drinking over 3 days prior to admission, decreased sleep, and suicidal ideation. The patient is currently on Hospital Day 5.   Chart Review from last 24 hours:  The patient's chart was reviewed and nursing notes were reviewed. The patient's case was discussed in multidisciplinary team rounds. Per nursing notes, the patient has been somewhat anxious appearing but has denied SI, HI, or AVH. She has been visible in the milieu interacting well with staff and peers. Per report of nursing she had a BM yesterday with mag citrate. Per [facility] she was compliant with scheduled medications and did receive 2 doses of Trazodone for sleep.  Information Obtained Today During Patient Interview: The patient was seen and evaluated on the unit. On assessment today the patient states that she has decided to go to the residential rehab program in [place] and hopes to have a bed Tuesday based on her intake with them. She would ideally like to discharge tomorrow to have time to work on FMLA leave paperwork and get her things together before going to the program. We discussed the increase today in her Cymbalta to avoid discontinuation symptoms when cross-tapering off Paxil, and we discussed that her Na+ today has normalized. She states her dry mouth has improved with the discontinuation of Paxil and she was encouraged to continue po hydration. She moved her bowels and states she feels better today with improved appetite. She denies cravings for alcohol or current signs of withdrawal. She states her sleep was interrupted last night due to frequent stooling after mag citrate  administration. She states her mood is getting brighter every day and she denies SI, HI, AVH. She denies any dizziness today and voices no physical complaints.   Principal Problem: MDD (major depressive disorder), recurrent severe, without psychosis ([place]) Diagnosis: Principal Problem:   MDD (major depressive disorder), recurrent severe, without psychosis ([place]) Active Problems:   Alcohol abuse  Total Time Spent in Direct Patient Care:  I personally spent 30 minutes on the unit in direct patient care. The direct patient care time included face-to-face time with the patient, reviewing the patient's chart, communicating with other professionals, and coordinating care. Greater than 50% of this time was spent in counseling or coordinating care with the patient regarding goals of hospitalization, psycho-education, and discharge planning needs.  Past Psychiatric History: see admission H&P  Past Medical History:  Past Medical History:  Diagnosis Date  . Anxiety   . Depression   . PONV (postoperative nausea and vomiting)     Past Surgical History:  Procedure Laterality Date  . AUGMENTATION MAMMAPLASTY Bilateral   . BACK SURGERY    . CESAREAN SECTION    . FRACTURE SURGERY    . 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 Anxiety disorder Sister   . Depression Brother   . Schizophrenia Brother   . Suicidality Brother   . Anxiety disorder Brother   . Alcohol abuse Sister   . Alcohol abuse  Brother   . Alcohol abuse Brother    Family Psychiatric  History: see admission H&P  Social History:  Social History   Substance and Sexual Activity  Alcohol Use Yes   Comment: binge drinking- was in recovery for past 4 years     Social History   Substance and Sexual Activity  Drug Use No     Social History   Socioeconomic History  . Marital status: Divorced    Spouse name: Not on file  . Number of children: Not on file  . Years of education: Not on file  . Highest education level: Not on file  Occupational History  . Not on file  Tobacco Use  . Smoking status: Never Smoker  . Smokeless tobacco: Never Used  Vaping Use  . Vaping Use: Never used  Substance and Sexual Activity  . 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 Drug use: No  . Sexual activity: Not Currently  Other Topics Concern  . Not on file  Social History Narrative  . Not on file   Social Determinants of Health   Financial Resource Strain: Not on file  Food Insecurity: Not on file  Transportation Needs: Not on file  Physical Activity: Not on file  Stress: Not on file  Social Connections: Not on file   Sleep: Fair  Appetite:  Improved  Current Medications: Current Facility-Administered Medications  Medication Dose Route Frequency Provider Last Rate Last Admin  . acamprosate (CAMPRAL) tablet 666 mg  666 mg Oral BID WC Sharma Covert, MD   666 mg at 01/22/21 1814  . acetaminophen (TYLENOL) tablet 650 mg  650 mg Oral Q6H PRN Rankin, Shuvon B, NP   650 mg at 01/20/21 2200  . alum & mag hydroxide-simeth (MAALOX/MYLANTA) 200-200-20 MG/5ML suspension 30 mL  30 mL Oral Q4H PRN Rankin, Shuvon B, NP      . antiseptic oral rinse (BIOTENE) solution 15 mL  15 mL Mouth Rinse PRN Nelda Marseille, Jora Galluzzo E, MD      . docusate sodium (COLACE) capsule 100 mg  100 mg Oral Daily Nelda Marseille, Elishia Kaczorowski E, MD   100 mg at 01/22/21 0734  . DULoxetine (CYMBALTA) DR capsule 30 mg  30 mg Oral Daily Sharma Covert, MD   30 mg at 01/22/21 5784  . folic acid (FOLVITE) tablet 1 mg  1 mg Oral Daily Ethelene Hal, NP   1 mg at 01/22/21 6962  . gabapentin (NEURONTIN) capsule 300 mg  300 mg Oral TID Lindell Spar I, NP   300 mg at 01/22/21 1813  . ibuprofen (ADVIL) tablet 400 mg  400 mg Oral  Q6H PRN Ethelene Hal, NP   400 mg at 01/19/21 1131  . magnesium hydroxide (MILK OF MAGNESIA) suspension 30 mL  30 mL Oral Daily PRN Connye Burkitt, NP   30 mL at 01/21/21 1235  . multivitamin with minerals tablet 1 tablet  1 tablet Oral Daily Ethelene Hal, NP   1 tablet at 01/22/21 9528  . thiamine tablet 100 mg  100 mg Oral Daily Ethelene Hal, NP   100 mg at 01/22/21 4132  . traZODone (DESYREL) tablet 50 mg  50 mg Oral QHS PRN,MR X 1 Margorie John W, PA-C   50 mg at 01/23/21 4401    Lab Results:  Results for orders placed or performed during the hospital encounter of 01/18/21 (from the past 48 hour(s))  Urinalysis,  Complete w Microscopic Urine, Unspecified Source     Status: Abnormal   Collection Time: 01/21/21 10:17 PM  Result Value Ref Range   Color, Urine STRAW (A) YELLOW   APPearance CLEAR CLEAR   Specific Gravity, Urine 1.008 1.005 - 1.030   pH 7.0 5.0 - 8.0   Glucose, UA NEGATIVE NEGATIVE mg/dL   Hgb urine dipstick SMALL (A) NEGATIVE   Bilirubin Urine NEGATIVE NEGATIVE   Ketones, ur NEGATIVE NEGATIVE mg/dL   Protein, ur NEGATIVE NEGATIVE mg/dL   Nitrite NEGATIVE NEGATIVE   Leukocytes,Ua NEGATIVE NEGATIVE   RBC / HPF 0-5 0 - 5 RBC/hpf   WBC, UA 0-5 0 - 5 WBC/hpf   Bacteria, UA NONE SEEN NONE SEEN   Squamous Epithelial / LPF 0-5 0 - 5    Comment: Performed at [facility], [place] [address] [place], [place] [address]  hCG, quantitative, pregnancy     Status: Abnormal   Collection Time: 01/22/21  6:48 AM  Result Value Ref Range   hCG, Beta Chain, Quant, S 8 (H) <5 mIU/mL    Comment:          GEST. AGE      CONC.  (mIU/mL)   <=1 WEEK        5 - 50     2 WEEKS       50 - 500     3 WEEKS       100 - 10,000     4 WEEKS     1,000 - 30,000     5 WEEKS     3,500 - 115,000   6-8 WEEKS     12,000 - 270,000    12 WEEKS     15,000 - 220,000        FEMALE AND NON-PREGNANT FEMALE:     LESS THAN 5 mIU/mL Performed at [facility], [place] [address], [place], [place] [address]   Comprehensive metabolic panel     Status: Abnormal   Collection Time: 01/22/21  6:48 AM  Result Value Ref Range   Sodium 134 (L) 135 - 145 mmol/L   Potassium 4.0 3.5 - 5.1 mmol/L   Chloride 100 98 - 111 mmol/L   CO2 26 22 - 32 mmol/L   Glucose, Bld 110 (H) 70 - 99 mg/dL    Comment: Glucose reference range applies only to samples taken after fasting for at least 8 hours.   BUN 13 6 - 20 mg/dL   Creatinine, Ser 0.69 0.44 - 1.00 mg/dL   Calcium 9.4 8.9 - 10.3 mg/dL   Total Protein 6.8 6.5 - 8.1 g/dL   Albumin 4.1 3.5 - 5.0 g/dL   AST 40 15 - 41 U/L   ALT 30 0 - 44 U/L   Alkaline Phosphatase 42 38 - 126 U/L   Total Bilirubin 0.9 0.3 - 1.2 mg/dL   GFR, Estimated >60 >60 mL/min    Comment: (NOTE) Calculated using the CKD-EPI Creatinine Equation (2021)    Anion gap 8 5 - 15    Comment: Performed at [facility], [place] [address], [place], [place] [address]  CBC with Differential/Platelet     Status: Abnormal   Collection Time: 01/22/21  6:48 AM  Result Value Ref Range   WBC 3.9 (L) 4.0 - 10.5 K/uL   RBC 4.44 3.87 - 5.11 MIL/uL   Hemoglobin 13.8 12.0 - 15.0 g/dL   HCT 41.7 36.0 - 46.0 %   MCV  93.9 80.0 - 100.0 fL   MCH 31.1 26.0 - 34.0 pg   MCHC 33.1 30.0 - 36.0 g/dL   RDW 12.7 11.5 - 15.5 %   Platelets 198 150 - 400 K/uL   nRBC 0.0 0.0 - 0.2 %   Neutrophils Relative % 48 %   Neutro Abs 1.9 1.7 - 7.7 K/uL   Lymphocytes Relative 37 %   Lymphs Abs 1.4 0.7 - 4.0 K/uL   Monocytes Relative 10 %   Monocytes Absolute 0.4 0.1 - 1.0 K/uL   Eosinophils Relative 4 %   Eosinophils Absolute 0.2 0.0 - 0.5 K/uL   Basophils Relative 1 %   Basophils Absolute 0.0 0.0 - 0.1 K/uL   Immature Granulocytes 0 %   Abs Immature Granulocytes 0.01 0.00 - 0.07 K/uL    Comment: Performed at [facility], [place] [address], [place], [place] [address]    Blood Alcohol level:  Lab Results  Component Value Date    ETH 267 (H) 41/93/7902    Metabolic Disorder Labs: Lab Results  Component Value Date   HGBA1C 5.2 01/19/2021   MPG 102.54 01/19/2021   No results found for: PROLACTIN Lab Results  Component Value Date   CHOL 300 (H) 01/19/2021   TRIG 41 01/19/2021   HDL 120 01/19/2021   CHOLHDL 2.5 01/19/2021   VLDL 8 01/19/2021   LDLCALC 172 (H) 01/19/2021    Physical Findings: AIMS: Facial and Oral Movements Muscles of Facial Expression: None, normal Lips and Perioral Area: None, normal Jaw: None, normal Tongue: None, normal,Extremity Movements Upper (arms, wrists, hands, fingers): None, normal Lower (legs, knees, ankles, toes): None, normal, Trunk Movements Neck, shoulders, hips: None, normal, Overall Severity Severity of abnormal movements (highest score from questions above): None, normal Incapacitation due to abnormal movements: None, normal Patient's awareness of abnormal movements (rate only patient's report): No Awareness, Dental Status Current problems with teeth and/or dentures?: No Does patient usually wear dentures?: No  CIWA:  CIWA-Ar Total: 2   Musculoskeletal: Strength & Muscle Tone: within normal limits Gait & Station: normal Patient leans: N/A  Psychiatric Specialty Exam: Physical Exam Vitals reviewed.  HENT:     Head: Normocephalic.  Pulmonary:     Effort: Pulmonary effort is normal.  Neurological:     Mental Status: She is alert.     Review of Systems  Respiratory: Negative for shortness of breath.   Cardiovascular: Negative for chest pain.  Gastrointestinal: Negative for abdominal pain, constipation, nausea and vomiting.  Neurological: Negative for light-headedness.    Blood pressure 92/75, pulse 84, temperature 98.2 F (36.8 C), temperature source Oral, resp. rate 16, height 5\' 4"  (1.626 m), weight 54 kg, SpO2 100 %.Body mass index is 20.43 kg/m.  General Appearance: good hygiene, well engaged with examiner, casually dressed  Eye Contact:  Good   Speech:  Clear and Coherent and Normal Rate  Volume:  Normal  Mood:  Calm, euthymic  Affect:  Moderate, stable, full  Thought Process:  Goal Directed and Linear  Orientation:  Full (Time, Place, and Person)  Thought Content:  Denies AVH, paranoia, or delusions; no acute psychosis noted on exam  Suicidal Thoughts:  Denied  Homicidal Thoughts:  Denied  Memory:  Immediate;   Good  Judgement:  Intact  Insight:  Fair  Psychomotor Activity:  Normal  Concentration:  Good  Recall:  Good  Fund of Knowledge:  Good  Language:  Good  Akathisia:  Negative  Assets:  Communication Skills Desire for  Improvement Resilience  ADL's:  Intact  Cognition:  WNL  Sleep:  Number of Hours: 4.75   Treatment Plan Summary: Diagnoses / Active Problems: MDD recurrent severe without psychotic features (r/o alcohol induced depressive d/o) Alcohol use d/o GAD Abnormal Beta HCG Hematuria Leukopenia Hypercholesterolemia  PLAN: 1. Safety and Monitoring:  -- Voluntary admission to inpatient psychiatric unit for safety, stabilization and treatment  -- Daily contact with patient to assess and evaluate symptoms and progress in treatment  -- Patient's case to be discussed in multi-disciplinary team meeting  -- Observation Level : q15 minute checks  -- Vital signs:  q12 hours  -- Precautions: suicide  2. Psychiatric Diagnoses and Treatment:   MDD recurrent severe without psychotic features (r/o alcohol induced depressive d/o)  GAD  -- Increase Cymbalta to 40mg  daily today after stop of Paxil yesterday to avoid discontinuation symptoms from previous Paxil dosing and as part of cross-taper - discussed with patient that she may need continued dose titration up on med after discharge   -- Given dry mouth and constipation, discontinued PRN Vistaril and will start biotene  -- Continue Trazodone 50mg  qhs PRN insomnia  -- Encouraged patient to participate in unit milieu and in scheduled group therapies   -- Short  Term Goals: Ability to identify changes in lifestyle to reduce recurrence of condition will improve and Ability to identify and develop effective coping behaviors will improve  -- Long Term Goals: Improvement in symptoms so as ready for discharge   Alcohol use d/o  -- ETOH 267 on admission  -- Continue CIWA protocol for withdrawal monitoring with oral thiamine, folic acid, and MVI replacement (recent CIWA scores:2,2,1,2)  -- Continue Campral 666mg  bid  -- Continue Neurontin 300mg  tid  -- Social work to confirm plans for residential rehab bed in [place] for this week  -- Short Term Goals: Ability to identify triggers associated with substance abuse/mental health issues will improve  -- Long Term Goals: Improvement in symptoms so as ready for discharge   3. Medical Issues Being Addressed:   Hypercholesterolemia   -- Patient prefers to work on diet and exercise changes prior to starting a statin - will need to see PCP for monitoring and management after discharge   Leukopenia on admission (WBC 3.9)  -- Repeat CBC shows WBC 3.9 with ANC 1900 - will need monitoring with PCP after discharge (per review of records has had WBC 3.7 in June 2020)   Quant HCG 7.3 on admission  -- Repeating quant HCG on 01/22/21 8 and repeat quant HCG 01/23/21 8 with urine pregnancy test negative  -- I spoke with Dr. Elgie Congo on call for Gyn and he suggests that she have a transvaginal pelvic ultrasound today to look at her ovaries prior to leaving for 45 residential rehab program and if the ultrasound is normal, she can f/u outpatient on her return home for additional w/u - ultrasound ordered   Moderate blood on admission UA  -- Repeat UA shows small hbg on dipstick with red cells 0-5. urine culture pending and will need to see PCP after discharge for further w/u   Constipation - resolved  -- MOM PRN - had results yesterday after Mag citrate  -- Continue Colace 100mg  daily and encouraged po hydration   Mild hyponatremia  (Na+ 134) - resolved  -- Na+ today 138  4. Discharge Planning:   -- Social work and case management to assist with discharge planning and identification of hospital follow-up needs prior to discharge  --  Estimated LOS: 1-2 days  -- Discharge Concerns: Need to establish a safety plan; Medication compliance and effectiveness  -- Discharge Goals: Return home with outpatient referrals for mental health follow-up including medication management/psychotherapy  Harlow Asa, MD, FAPA 01/23/2021, 7:11 AM

## 2021-01-23 NOTE — Progress Notes (Signed)
   01/23/21 2300  COVID-19 Daily Checkoff  Have you had a fever (temp > 37.80C/100F)  in the past 24 hours?  No  If you have had runny nose, nasal congestion, sneezing in the past 24 hours, has it worsened? No  COVID-19 EXPOSURE  Have you traveled outside the state in the past 14 days? No  Have you been in contact with someone with a confirmed diagnosis of COVID-19 or PUI in the past 14 days without wearing appropriate PPE? No  Have you been living in the same home as a person with confirmed diagnosis of COVID-19 or a PUI (household contact)? No  Have you been diagnosed with COVID-19? No

## 2021-01-23 NOTE — Progress Notes (Signed)
Patient encouraged to increase fluid intake as she will need to have a full bladder for the pelvic ultrasound per [place] technician.

## 2021-01-23 NOTE — Progress Notes (Signed)
   01/23/21 2303  Psych Admission Type (Psych Patients Only)  Admission Status Voluntary  Psychosocial Assessment  Patient Complaints Insomnia  Eye Contact Other (Comment) (appropriate)  Facial Expression Other (Comment) (pleasant)  Affect Appropriate to circumstance  Speech Logical/coherent  Interaction Assertive  Motor Activity Other (Comment) (WDL)  Behavior Characteristics Appropriate to situation  Mood Pleasant  Thought Process  Coherency WDL  Content WDL  Delusions None reported or observed  Perception WDL  Hallucination None reported or observed  Judgment Impaired  Confusion None  Danger to Self  Current suicidal ideation? Denies  Self-Injurious Behavior No self-injurious ideation or behavior indicators observed or expressed   Agreement Not to Harm Self Yes  Description of Agreement verbal contract  Danger to Others  Danger to Others None reported or observed

## 2021-01-23 NOTE — Progress Notes (Signed)
Patient rates depression 3/10, anxiety 5/10, and hopelessness 4/10 (10 being worst). She denies SI/HI. She reports fair sleep last night. She reports having a fair appetite today. She denies alcohol withdrawal symptoms or cravings today. Her goal today "focus just on today." She reported that her discharge plan is to go to a long-term treatment program in [place].   Orders reviewed with pt. Vital signs reviewed. Verbal support provided. 15 minute checks performed for safety.   Patient compliant with treatment plan.

## 2021-01-23 NOTE — [facility] Group Notes (Signed)
The focus of this group is to help patients establish daily goals to achieve during treatment and discuss how the patient can incorporate goal setting into their daily lives to aide in recovery.  Pt stated that her Otilio Carpen was to be less concerned about herself and concentrate on others

## 2021-01-23 NOTE — Plan of Care (Signed)
Patient's transvaginal pelvic u/s shows normal endometrium and normal ovaries with no adenexal mass. She has a 1.7cm anterior fundal fibroid. I called Dr. Elgie Congo with GYN and discussed the results. In light of ultrasound findings, he feels the patient can f/u the elevated BHCG as an outpatient after she completes residential rehab. Patient made aware.  Viann Fish, MD, Alda Ponder

## 2021-01-23 NOTE — BHH Suicide Risk Assessment (Signed)
[facility] Discharge Suicide Risk Assessment   Principal Problem: MDD (major depressive disorder), recurrent severe, without psychosis ([place]) Discharge Diagnoses: Principal Problem:   MDD (major depressive disorder), recurrent severe, without psychosis ([place]) Active Problems:   Alcohol abuse  Total Time Spent in Direct Patient Care:  I personally spent 30 minutes on the unit in direct patient care. The direct patient care time included face-to-face time with the patient, reviewing the patient's chart, communicating with other professionals, and coordinating care. Greater than 50% of this time was spent in counseling or coordinating care with the patient regarding goals of hospitalization, psycho-education, and discharge planning needs.  Subjective: Patient was seen on the unit today on rounds in the presence of social work, and she reports that she feels mood stable today. She is more hopeful given her pending admission tomorrow to a residential rehab program. She states her daughters will be with her today and will accompany her to the program in [place] tomorrow. She denies SI, HI, AVH, paranoia, or delusions. She states her constipation has resolved. She has no evidence of withdrawal today and denies medication side-effects other than some mild, intermittent, cognitive dulling which she attributes to recent medication changes. She denies any physical complaints. Time was given for questions.   Musculoskeletal: Strength & Muscle Tone: within normal limits Gait & Station: normal, steady Patient leans: N/A  Psychiatric Specialty Exam: Review of Systems  Respiratory: Negative for shortness of breath.   Cardiovascular: Negative for chest pain.  Gastrointestinal: Negative for constipation, nausea and vomiting.    Blood pressure 109/83, pulse 75, temperature 98.2 F (36.8 C), temperature source Oral, resp. rate 18, height 5\' 4"  (1.626 m), weight 54 kg, SpO2 100 %.Body mass index is 20.43 kg/m.    General Appearance: good hygiene, well engaged with examiner, casually dressed  Eye Contact:  Good  Speech:  Clear and Coherent and Normal Rate  Volume:  Normal  Mood:  Calm, euthymic  Affect:  Moderate, stable, full  Thought Process:  Goal Directed and Linear  Orientation:  Full (Time, Place, and Person)  Thought Content:  Denies AVH, paranoia, or delusions; no acute psychosis noted on exam  Suicidal Thoughts:  Denied  Homicidal Thoughts:  Denied  Memory:  Immediate- Good  Judgement:  Intact  Insight:  Improved   Psychomotor Activity:  Normal  Concentration:  Good  Recall:  Good  Fund of Knowledge:  Good  Language:  Good  Akathisia:  Negative  Assets:  Communication Skills Desire for Improvement Resilience  ADL's:  Intact  Cognition:  WNL   Mental Status Per Nursing Assessment::   On Admission: suicide attempt prior to admission, alcohol relapse prior to admission, worsening depressed mood  Demographic Factors:  Living alone and Divorced  Loss Factors: Some financial difficulties  Historical Factors: Family history of suicide and family history of substance abuse and mental health issues  Risk Reduction Factors:   Sense of responsibility to family, Employed and has children, educated  Continued Clinical Symptoms:  More than one psychiatric diagnosis Previous Psychiatric Diagnoses and Treatments  Cognitive Features That Contribute To Risk:  None    Suicide Risk:  Mild:  Suicidal ideation of limited frequency, intensity, duration, and specificity.  There are no identifiable plans, no associated intent, mild dysphoria and related symptoms, good self-control (both objective and subjective assessment), few other risk factors, and identifiable protective factors, including available and accessible social support.   Follow-up Information    [facility] Follow up on 01/26/2021.  Why: Please contact this provider post discharge from [place] for therapy  services.  Contact information: [address] [place], [place] [address]  Phone: [phone]       [facility] Follow up on 02/21/2021.   Specialty: Behavioral Health Why: Please contact this provider post discharge from [place] for medication management services.  Contact information: [place] [address] [phone]        Maurice Small, MD. Go on 01/25/2021.   Specialty: Family Medicine Why: You have a hospital follow up appointment on 01/25/21 at 11:00 am for primary care services.  This appointment will be held in person.  Contact information: [address] [place] [address] [phone]               Plan Of Care/Follow-up recommendations:  Activity:  as tolerated Diet:  heart healthy Other:  Patient was advised to go directly to residential rehab program as scheduled without fail. She was advised to abstain from alcohol use after discharge. She was instructed to see her PCP for recheck of hematuria, hypercholesterolemia, and leukopenia and was instructed to see her Gyn doctor for recheck of her elevated beta HCG and uterine fibroid without fail. Medication compliance was encouraged. Patient will be contacted after discharge if her urine culture is positive. Urine culture currently pending.  Harlow Asa, MD, FAPA 01/24/2021, 10:33 AM

## 2021-01-23 NOTE — BHH Group Notes (Signed)
LCSW Group Therapy Note  01/23/2021   10:45-11:43am   Type of Therapy and Topic:  Group Therapy: Anger Cues and Responses  Participation Level:  Active   Description of Group:   In this group, patients learned how to recognize the physical, cognitive, emotional, and behavioral responses they have to anger-provoking situations.  They identified a recent time they became angry and how they reacted.  They analyzed how their reaction was possibly beneficial and how it was possibly unhelpful.  The group discussed a variety of healthier coping skills that could help with such a situation in the future.  Focus was placed on how helpful it is to recognize the underlying emotions to our anger, because working on those can lead to a more permanent solution as well as our ability to focus on the important rather than the urgent.  Therapeutic Goals: 1. Patients will remember their last incident of anger and how they felt emotionally and physically, what their thoughts were at the time, and how they behaved. 2. Patients will identify how their behavior at that time worked for them, as well as how it worked against them. 3. Patients will explore possible new behaviors to use in future anger situations. 4. Patients will learn that anger itself is normal and cannot be eliminated, and that healthier reactions can assist with resolving conflict rather than worsening situations.  Summary of Patient Progress:  The patient shared that she has realized she can get angry at herself but struggles to get angry with others. Patient shared with the group that deep breathing really helps her to get centered and move forward.  Therapeutic Modalities:   Cognitive Behavioral Therapy  Tye Savoy

## 2021-01-24 LAB — URINE CULTURE: Culture: <10000 — AB

## 2021-01-24 MED ORDER — TRAZODONE HCL 50 MG PO TABS: 50.0000 mg | ORAL_TABLET | Freq: Every evening | ORAL | 0 refills | Status: DC | PRN

## 2021-01-24 MED ORDER — DOCUSATE SODIUM 100 MG PO CAPS: 100.0000 mg | ORAL_CAPSULE | Freq: Every day | ORAL | 0 refills | Status: DC

## 2021-01-24 MED ORDER — BIOTENE DRY MOUTH MT LIQD: 15.0000 mL | OROMUCOSAL | 0 refills | Status: DC | PRN

## 2021-01-24 MED ORDER — GABAPENTIN 300 MG PO CAPS: 300.0000 mg | ORAL_CAPSULE | Freq: Three times a day (TID) | ORAL | 0 refills | Status: DC

## 2021-01-24 MED ORDER — ACAMPROSATE CALCIUM 333 MG PO TBEC: 666.0000 mg | DELAYED_RELEASE_TABLET | Freq: Two times a day (BID) | ORAL | 0 refills | Status: DC

## 2021-01-24 MED ORDER — DULOXETINE HCL 40 MG PO CPEP
40.0000 mg | ORAL_CAPSULE | Freq: Every day | ORAL | 0 refills | Status: DC
Start: 2021-01-25 — End: 2021-03-29

## 2021-01-24 MED FILL — traZODone HCL 50 MG TABS: 50 | 90 days supply | Qty: 135 | Fill #0

## 2021-01-24 NOTE — Discharge Summary (Signed)
Physician Discharge Summary Note  Patient:  Debra [place] is an 60 y.o., female MRN:  [id number] DOB:  27-Sep-1961 Patient phone:  [phone] (home)  Patient address:   [place] [address],  Total Time spent with patient: 30 minutes  Date of Admission:  01/18/2021 Date of Discharge: 01/24/2021  Reason for Admission: (From MD's admission note):  Patient is a 60 year old female with a past psychiatric history significant for alcohol dependence, depression, anxiety who presented to the [facility] emergency department on 01/17/2021 secondary to excessive drinking over the 3 days prior to admission, decreased sleep, and suicidal ideation. She reported that she had attempted to cut her wrist earlier in the day. These were superficial injuries, and there was no bleeding or open wounds. She had apparently been noncompliant with medications and "takes them on and off". She was seen by the comprehensive clinical assessment service on 01/18/2021. She reported a history of major depression as well as alcohol use disorder and attention deficit disorder. She reported that she presented to [place] with suicidal ideation. She stated she had made a bad decision and got back into a bad relationship that was "toxic". She stated that she had had 6 to 7 months of sobriety in 2021, but then began drinking again in approximately October or November 2021. 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 She feels very alone and isolated. She admitted to relapsing on alcohol approximately a week prior to admission and that she had been drinking heavily. She also reported history of significant delirium tremens, as well as a history of withdrawal seizures. The decision was made to admit her to the hospital for evaluation and stabilization.  Evaluation on the unit, day of discharge: Patient stated she is ready to be discharged and will be  going to rehab in [place] tomorrow. She denies SI/HI/AVH, paranoia and delusions. She denies any alcohol withdrawal symptoms. She is taking her medications as prescribed without side effects. She agrees to continue to take her medications after discharge. She has attended group therapy. She reported good sleep and a good appetite. Patient is stable for discharge.   Principal Problem: MDD (major depressive disorder), recurrent severe, without psychosis ([place]) Discharge Diagnoses: Principal Problem:   MDD (major depressive disorder), recurrent severe, without psychosis ([place]) Active Problems:   Alcohol abuse   Past Psychiatric History:  Patient has a longstanding history of depression as well as alcohol dependence.  She has been and detox and rehab in the past.  She stated the last time she was in rehab was approximately 4 years ago.  She has had success with sobriety for an extended period of time with [facility].  She has been in treatment for depression with a nurse practitioner locally.  She has been on paroxetine for depression.  Previously we will attempt to switch her to sertraline, but she did worse with that.  She admitted to intermittent compliance with the paroxetine.  Past Medical History:  Past Medical History:  Diagnosis Date  . Anxiety   . Depression   . PONV (postoperative nausea and vomiting)     Past Surgical History:  Procedure Laterality Date  . AUGMENTATION MAMMAPLASTY Bilateral   . BACK SURGERY    . CESAREAN SECTION    . FRACTURE SURGERY    . 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 Anxiety disorder Sister   . Depression Brother   . Schizophrenia Brother   . Suicidality Brother   . Anxiety disorder Brother   . Alcohol abuse Sister   . Alcohol abuse  Brother   . Alcohol abuse Brother    Family Psychiatric  History: She has a significant history of suicide by her brother.  The brother also had substance issues.  Father had substance issues.  Unspecified psychiatric problems with others in her family.  Social History:  Social History   Substance and Sexual Activity  Alcohol Use Yes   Comment: binge drinking- was in recovery for past 4 years     Social History   Substance and Sexual Activity  Drug Use No    Social History   Socioeconomic History  . Marital status: Divorced    Spouse name: Not on file  . Number of children: Not on file  . Years of education: Not on file  . Highest education level: Not on file  Occupational History  . Not on file  Tobacco Use  . Smoking status: Never Smoker  . Smokeless tobacco: Never Used  Vaping Use  . Vaping Use: Never used  Substance and Sexual Activity  . 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 Drug use: No  . Sexual activity: Not Currently  Other Topics Concern  . Not on file  Social History Narrative  . Not on file   Social Determinants of Health   Financial Resource Strain: Not on file  Food Insecurity: Not on file  Transportation Needs: Not on file  Physical Activity: Not on file  Stress: Not on file  Social Connections: Not on file    Hospital Course:  After the above admission evaluation, Debra [place]'s presenting symptoms were noted. She was recommended for mood stabilization treatments. The medication regimen targeting those presenting symptoms were discussed with her & initiated with his/her consent. Her UDS on arrival to the ED was negative, her alcohol content was 267. She did develop alcohol withdrawal symptoms & received alcohol detoxification treatments. She was medicated, stabilized & discharged on the medications as listed on her discharge medication list below. Besides the mood stabilization treatments, Debra [place] was also enrolled &  participated in the group counseling sessions being offered & held on this unit. He/She learned coping skills. She presented no other significant pre-existing medical issues that required treatment. She tolerated his treatment regimen without any adverse effects or reactions reported.   During the course of her hospitalization, the 15-minute checks were adequate to ensure patient's safety. Debra [place] did not display any dangerous, violent or suicidal behavior on the unit.  She interacted with patients & staff appropriately, participated appropriately in the group sessions/therapies. Her medications were addressed & adjusted to meet his/her needs. She was recommended for outpatient follow-up care & medication management upon discharge to assure continuity of care & mood stability.  At the time of discharge patient is not reporting any acute suicidal/homicidal ideations. She feels more confident about her self-care & in managing his mental health. She currently denies any new issues or concerns. Education and supportive counseling provided throughout his/her hospital stay & upon discharge.   Today upon her discharge evaluation with the attending psychiatrist, Debra [place] shares she is doing well. She denies any other specific concerns. She is sleeping well. Her appetite  is good. She denies other physical complaints. She denies AH/VH, delusional thoughts or paranoia. She does not appear to be responding to any internal stimuli. She feels that her medications have been helpful & is in agreement to continue her current treatment regimen as recommended. She was able to engage in safety planning including plan to return to [facility] or contact emergency services if she feels unable to maintain her own safety or the safety of others. Pt had no further questions, comments, or concerns. She left [facility] with all personal belongings in no apparent distress. Transportation per her family.   Physical Findings: AIMS: Facial and Oral  Movements Muscles of Facial Expression: None, normal Lips and Perioral Area: None, normal Jaw: None, normal Tongue: None, normal,Extremity Movements Upper (arms, wrists, hands, fingers): None, normal Lower (legs, knees, ankles, toes): None, normal, Trunk Movements Neck, shoulders, hips: None, normal, Overall Severity Severity of abnormal movements (highest score from questions above): None, normal Incapacitation due to abnormal movements: None, normal Patient's awareness of abnormal movements (rate only patient's report): No Awareness, Dental Status Current problems with teeth and/or dentures?: No Does patient usually wear dentures?: No  CIWA:  CIWA-Ar Total: 3 COWS:     Musculoskeletal: Strength & Muscle Tone: within normal limits Gait & Station: normal Patient leans: N/A  Psychiatric Specialty Exam: See MD's Discharge SRA Physical Exam Vitals and nursing note reviewed.  Constitutional:      Appearance: Normal appearance.  Musculoskeletal:        General: Normal range of motion.     Cervical back: Normal range of motion.  Neurological:     Mental Status: She is alert and oriented to person, place, and time.     Review of Systems  Constitutional: Negative.   HENT: Positive for rhinorrhea. Negative for sneezing and sore throat.   Respiratory: Negative for cough, shortness of breath and wheezing.   Gastrointestinal: Negative.   Genitourinary: Negative.   Musculoskeletal: Negative.     Blood pressure 109/83, pulse 75, temperature 98.2 F (36.8 C), temperature source Oral, resp. rate 18, height 5\' 4"  (1.626 m), weight 54 kg, SpO2 100 %.Body mass index is 20.43 kg/m.  Sleep:  Number of Hours: 4.25     Have you used any form of tobacco in the last 30 days? (Cigarettes, Smokeless Tobacco, Cigars, and/or Pipes): (P) No  Has this patient used any form of tobacco in the last 30 days? (Cigarettes, Smokeless Tobacco, Cigars, and/or Pipes) Yes, N/A  Blood Alcohol level:  Lab  Results  Component Value Date   ETH 267 (H) 42/68/3419    Metabolic Disorder Labs:  Lab Results  Component Value Date   HGBA1C 5.2 01/19/2021   MPG 102.54 01/19/2021   No results found for: PROLACTIN Lab Results  Component Value Date   CHOL 300 (H) 01/19/2021   TRIG 41 01/19/2021   HDL 120 01/19/2021   CHOLHDL 2.5 01/19/2021   VLDL 8 01/19/2021   LDLCALC 172 (H) 01/19/2021    See Psychiatric Specialty Exam and Suicide Risk Assessment completed by Attending Physician prior to discharge.  Discharge destination:  Home  Is patient on multiple antipsychotic therapies at discharge:  No   Has Patient had three or more failed trials of antipsychotic monotherapy by history:  No  Recommended Plan for Multiple Antipsychotic Therapies: NA   Allergies as of 01/24/2021      Reactions   Latex Shortness Of Breath, Swelling, Rash      Medication List    STOP taking  these medications   ferrous sulfate 325 (65 FE) MG tablet Commonly known as: FerrouSul   ibuprofen 200 MG tablet Commonly known as: ADVIL   methylphenidate 18 MG CR tablet Commonly known as: CONCERTA   PARoxetine 40 MG tablet Commonly known as: Paxil     TAKE these medications     Indication  acamprosate 333 MG tablet Commonly known as: CAMPRAL Take 2 tablets (666 mg total) by mouth 2 (two) times daily with a meal.  Indication: Abuse or Misuse of Alcohol   antiseptic oral rinse Liqd 15 mLs by Mouth Rinse route as needed for dry mouth.  Indication: dry mouth   cetirizine 10 MG tablet Commonly known as: ZYRTEC Take 10 mg by mouth daily.  Indication: Hayfever   docusate sodium 100 MG capsule Commonly known as: COLACE Take 1 capsule (100 mg total) by mouth daily. Start taking on: January 25, 2021  Indication: Constipation   DULoxetine HCl 40 MG Cpep Take 40 mg by mouth daily. Start taking on: January 25, 2021  Indication: Major Depressive Disorder   estradiol-norethindrone 0.05-0.14 MG/DAY Commonly  known as: [place] 1 patch onto the skin once a week.  Indication: Deficiency of the Hormone Estrogen   gabapentin 300 MG capsule Commonly known as: NEURONTIN Take 1 capsule (300 mg total) by mouth 3 (three) times daily. What changed:   when to take this  reasons to take this  Indication: Alcohol Withdrawal Syndrome   glucosamine-chondroitin 500-400 MG tablet Take 1 tablet by mouth daily.  Indication: Joint Damage causing Pain and Loss of Function   HAIR/SKIN/NAILS PO Take 1 tablet by mouth daily.  Indication: Hair and nail health   polyvinyl alcohol 1.4 % ophthalmic solution Commonly known as: LIQUIFILM TEARS Place 1 drop into both eyes as needed for dry eyes.  Indication: dry eyes   traZODone 50 MG tablet Commonly known as: DESYREL Take 1 tablet (50 mg total) by mouth at bedtime as needed and may repeat dose one time if needed for sleep. What changed:   how much to take  how to take this  when to take this  reasons to take this  additional instructions  Indication: Trouble Sleeping   VITAMIN D PO Take 1 tablet by mouth daily.  Indication: Vitamin D support       Follow-up Information    [facility] Follow up on 01/26/2021.   Why: Please contact this provider post discharge from [place] for therapy services.  Contact information: [address] [place], [place] [address]  Phone: [phone]       [facility] Follow up on 02/21/2021.   Specialty: Behavioral Health Why: Please contact this provider post discharge from [place] for medication management services.  Contact information: [place] [address] [phone]        Maurice Small, MD. Go on 01/25/2021.   Specialty: Family Medicine Why: You have a hospital follow up appointment on 01/25/21 at 11:00 am for primary care services.  This appointment will be held in person.  Contact information: [place] [address] [place] Follow up.   Why: You have an admission here on 01/24/21. Contact information: [address] [place], [place] [address]  [phone]              Follow-up recommendations:  Activity:  as tolerated Diet:  Heart healthy  Comments:  Prescriptions given at discharge.  Patient agreeable to plan.  Given opportunity to ask questions.  Appears to feel comfortable with discharge denies any current suicidal or homicidal thoughts.   Patient is instructed prior to discharge to: Take all medications as prescribed by her mental healthcare provider. Report any adverse effects and or reactions from the medicines to her outpatient provider promptly. Patient has been instructed & cautioned: To not engage in alcohol and or illegal drug use while on prescription medicines. In the event of worsening symptoms, patient is instructed to call the crisis hotline, 911 and or go to the nearest ED for appropriate evaluation and treatment of symptoms. To follow-up with her primary care provider for your other medical issues, concerns and or health care needs.  Signed: Ethelene Hal, NP 01/24/2021, 11:31 AM

## 2021-01-24 NOTE — Tx Team (Signed)
Interdisciplinary Treatment and Diagnostic Plan Update  01/24/2021 Time of Session: 9;00am SHALIE SCHREMP MRN: [id number]  Principal Diagnosis: MDD (major depressive disorder), recurrent severe, without psychosis ([place])  Secondary Diagnoses: Principal Problem:   MDD (major depressive disorder), recurrent severe, without psychosis ([place]) Active Problems:   Alcohol abuse   Current Medications:  Current Facility-Administered Medications  Medication Dose Route Frequency Provider Last Rate Last Admin  . acamprosate (CAMPRAL) tablet 666 mg  666 mg Oral BID WC Sharma Covert, MD   666 mg at 01/24/21 3267  . acetaminophen (TYLENOL) tablet 650 mg  650 mg Oral Q6H PRN Rankin, Shuvon B, NP   650 mg at 01/20/21 2200  . alum & mag hydroxide-simeth (MAALOX/MYLANTA) 200-200-20 MG/5ML suspension 30 mL  30 mL Oral Q4H PRN Rankin, Shuvon B, NP      . antiseptic oral rinse (BIOTENE) solution 15 mL  15 mL Mouth Rinse PRN Harlow Asa, MD   15 mL at 01/24/21 0814  . docusate sodium (COLACE) capsule 100 mg  100 mg Oral Daily Nelda Marseille, Amy E, MD   100 mg at 01/23/21 0754  . DULoxetine (CYMBALTA) DR capsule 40 mg  40 mg Oral Daily Harlow Asa, MD   40 mg at 01/24/21 0813  . folic acid (FOLVITE) tablet 1 mg  1 mg Oral Daily Ethelene Hal, NP   1 mg at 01/24/21 0813  . gabapentin (NEURONTIN) capsule 300 mg  300 mg Oral TID Lindell Spar I, NP   300 mg at 01/23/21 1634  . ibuprofen (ADVIL) tablet 400 mg  400 mg Oral Q6H PRN Ethelene Hal, NP   400 mg at 01/19/21 1131  . magnesium hydroxide (MILK OF MAGNESIA) suspension 30 mL  30 mL Oral Daily PRN Connye Burkitt, NP   30 mL at 01/21/21 1235  . multivitamin with minerals tablet 1 tablet  1 tablet Oral Daily Ethelene Hal, NP   1 tablet at 01/24/21 0813  . thiamine tablet 100 mg  100 mg Oral Daily Ethelene Hal, NP   100 mg at 01/24/21 0813  . traZODone (DESYREL) tablet 50 mg  50 mg Oral QHS PRN,MR X 1 Margorie John W, PA-C   50  mg at 01/23/21 2201   PTA Medications: Medications Prior to Admission  Medication Sig Dispense Refill Last Dose  . Biotin w/ Vitamins C & E (HAIR/SKIN/NAILS PO) Take 1 tablet by mouth daily.     . cetirizine (ZYRTEC) 10 MG tablet Take 10 mg by mouth daily.     Marland Kitchen estradiol-norethindrone (COMBIPATCH) 0.05-0.14 MG/DAY Place 1 patch onto the skin once a week.     . ferrous sulfate (FERROUSUL) 325 (65 FE) MG tablet Take 1 tablet (325 mg total) by mouth 3 (three) times daily with meals for 14 days. (Patient not taking: Reported on 01/18/2021) 42 tablet 0   . gabapentin (NEURONTIN) 300 MG capsule Take 1 capsule (300 mg total) by mouth 3 (three) times daily as needed. (Patient taking differently: Take 300 mg by mouth 3 (three) times daily as needed (anxiety).) 270 capsule 0   . glucosamine-chondroitin 500-400 MG tablet Take 1 tablet by mouth daily.     Marland Kitchen ibuprofen (ADVIL) 200 MG tablet Take 600 mg by mouth every 6 (six) hours as needed for fever, headache or mild pain.     . methylphenidate 18 MG PO CR tablet Take 1 tablet (18 mg total) by mouth daily. (Patient taking differently: Take 18 mg by mouth  daily as needed (for work).) 30 tablet 0   . PARoxetine (PAXIL) 40 MG tablet Take 1 tablet (40 mg total) by mouth daily. 30 tablet 2   . polyvinyl alcohol (LIQUIFILM TEARS) 1.4 % ophthalmic solution Place 1 drop into both eyes as needed for dry eyes.     . traZODone (DESYREL) 50 MG tablet Take 50-75 mg by mouth at bedtime. (Patient taking differently: Take 50 mg by mouth at bedtime.) 135 tablet 0   . VITAMIN D PO Take 1 tablet by mouth daily.       Patient Stressors: Financial difficulties Health problems Medication change or noncompliance Occupational concerns Substance abuse  Patient Strengths: Ability for insight Average or above average intelligence Capable of independent living Communication skills Motivation for treatment/growth Physical Health Supportive family/friends Work  skills  Treatment Modalities: Medication Management, Group therapy, Case management,  1 to 1 session with clinician, Psychoeducation, Recreational therapy.   Physician Treatment Plan for Primary Diagnosis: MDD (major depressive disorder), recurrent severe, without psychosis ([place]) Long Term Goal(s): Improvement in symptoms so as ready for discharge Improvement in symptoms so as ready for discharge   Short Term Goals: Ability to identify changes in lifestyle to reduce recurrence of condition will improve Ability to identify and develop effective coping behaviors will improve Ability to identify triggers associated with substance abuse/mental health issues will improve  Medication Management: Evaluate patient's response, side effects, and tolerance of medication regimen.  Therapeutic Interventions: 1 to 1 sessions, Unit Group sessions and Medication administration.  Evaluation of Outcomes: Adequate for Discharge  Physician Treatment Plan for Secondary Diagnosis: Principal Problem:   MDD (major depressive disorder), recurrent severe, without psychosis ([place]) Active Problems:   Alcohol abuse  Long Term Goal(s): Improvement in symptoms so as ready for discharge Improvement in symptoms so as ready for discharge   Short Term Goals: Ability to identify changes in lifestyle to reduce recurrence of condition will improve Ability to identify and develop effective coping behaviors will improve Ability to identify triggers associated with substance abuse/mental health issues will improve     Medication Management: Evaluate patient's response, side effects, and tolerance of medication regimen.  Therapeutic Interventions: 1 to 1 sessions, Unit Group sessions and Medication administration.  Evaluation of Outcomes: Adequate for Discharge   RN Treatment Plan for Primary Diagnosis: MDD (major depressive disorder), recurrent severe, without psychosis ([place]) Long Term Goal(s): Knowledge of disease  and therapeutic regimen to maintain health will improve  Short Term Goals: Ability to verbalize feelings will improve, Ability to identify and develop effective coping behaviors will improve and Compliance with prescribed medications will improve  Medication Management: RN will administer medications as ordered by provider, will assess and evaluate patient's response and provide education to patient for prescribed medication. RN will report any adverse and/or side effects to prescribing provider.  Therapeutic Interventions: 1 on 1 counseling sessions, Psychoeducation, Medication administration, Evaluate responses to treatment, Monitor vital signs and CBGs as ordered, Perform/monitor CIWA, COWS, AIMS and Fall Risk screenings as ordered, Perform wound care treatments as ordered.  Evaluation of Outcomes: Adequate for Discharge   LCSW Treatment Plan for Primary Diagnosis: MDD (major depressive disorder), recurrent severe, without psychosis ([place]) Long Term Goal(s): Safe transition to appropriate next level of care at discharge, Engage patient in therapeutic group addressing interpersonal concerns.  Short Term Goals: Engage patient in aftercare planning with referrals and resources, Increase social support and Facilitate patient progression through stages of change regarding substance use diagnoses and concerns  Therapeutic Interventions:  Assess for all discharge needs, 1 to 1 time with Social worker, Explore available resources and support systems, Assess for adequacy in community support network, Educate family and significant other(s) on suicide prevention, Complete Psychosocial Assessment, Interpersonal group therapy.  Evaluation of Outcomes: Adequate for Discharge   Progress in Treatment: Attending groups: Yes. Participating in groups: Yes. Taking medication as prescribed: Yes. Toleration medication: Yes. Family/Significant other contact made: Yes, individual(s) contacted:  friend Patient  understands diagnosis: Yes. Discussing patient identified problems/goals with staff: Yes. Medical problems stabilized or resolved: Yes. Denies suicidal/homicidal ideation: Yes. Issues/concerns per patient self-inventory: No. Other: None  New problem(s) identified: No, Describe:  none  New Short Term/Long Term Goal(s):medication stabilization, elimination of SI thoughts, development of comprehensive mental wellness plan.   Patient Goals: "not be suicidal."   Discharge Plan or Barriers: Patient is to transfer to [facility] for longer term mental health and substance use inpatient treatment.   Reason for Continuation of Hospitalization: Medication stabilization  Estimated Length of Stay: Adequate for discharge  Attendees: Patient:  01/24/2021   Physician:  01/24/2021   Nursing:  01/24/2021   RN Care Manager: 01/24/2021   Social Worker: Darletta Moll, [place] 01/24/2021   Recreational Therapist:  01/24/2021   Other:  01/24/2021   Other:  01/24/2021   Other: 01/24/2021     Scribe for Treatment Team: Vassie Moselle, [place] 01/24/2021 10:21 AM

## 2021-01-24 NOTE — Progress Notes (Signed)
Recreation Therapy Notes  Date: 3.7.22 Time: 0930 Location: 300 [address] Dayroom  Group Topic: Stress Management  Goal Area(s) Addresses:  Patient will identify positive stress management techniques. Patient will identify benefits of using stress management post d/c.  Behavioral Response: Engaged  Intervention: Stress Management  Activity: Guided Imagery.  LRT read a script that took patients on a relaxing journey on the beach to listen to the peaceful waves and take in the all calming elements.  Patients were to listen as the script was read to engage in the activity.    Education:  Stress Management, Discharge Planning.   Education Outcome: Acknowledges Education  Clinical Observations/Feedback: Pt attended and participated in group session.    Victorino Sparrow, LRT/CTRS         Ria Comment, Remmi Armenteros A 01/24/2021 11:08 AM

## 2021-01-24 NOTE — Progress Notes (Signed)
Discharge Note:  Patient denies SI/HI AVH at this time. Discharge instructions, AVS, prescriptions and transition record gone over with patient. Patient agrees to comply with medication management, follow-up visit, and outpatient therapy. Patient belongings returned to patient. Patient questions and concerns addressed and answered.  Patient ambulatory off unit.  Patient discharged to home with friend.   

## 2021-01-24 NOTE — Progress Notes (Signed)
  [facility] Adult Case Management Discharge Plan :  Will you be returning to the same living situation after discharge:  No. Daughter's home and then [place]  At discharge, do you have transportation home?: Yes,  via daughters Do you have the ability to pay for your medications: Yes,  has insurance  Release of information consent forms completed and in the chart;  Patient's signature needed at discharge.  Patient to Follow up at:  Follow-up Information    [facility] Follow up on 01/26/2021.   Why: Please contact this provider post discharge from [place] for therapy services.  Contact information: [address] [place], [place] [address]  Phone: [phone]       [facility] Follow up on 02/21/2021.   Specialty: Behavioral Health Why: Please contact this provider post discharge from [place] for medication management services.  Contact information: [place] [address] [phone]        Maurice Small, MD. Go on 01/25/2021.   Specialty: Family Medicine Why: You have a hospital follow up appointment on 01/25/21 at 11:00 am for primary care services.  This appointment will be held in person.  Contact information: [address] [place] [address] [phone]               Next level of care provider has access to [place] and Suicide Prevention discussed: Yes,  w/ friend  Have you used any form of tobacco in the last 30 days? (Cigarettes, Smokeless Tobacco, Cigars, and/or Pipes): (P) No  Has patient been referred to the Quitline?: N/A patient is not a smoker  Patient has been referred for addiction treatment: Yes  Mliss Fritz, [place] 01/24/2021, 10:33 AM

## 2021-01-24 NOTE — Plan of Care (Signed)
Pt. Progressing ready for DC today

## 2021-01-25 DIAGNOSIS — F900 Attention-deficit hyperactivity disorder, predominantly inattentive type: Secondary | ICD-10-CM | POA: Diagnosis not present

## 2021-01-25 DIAGNOSIS — F1020 Alcohol dependence, uncomplicated: Secondary | ICD-10-CM | POA: Diagnosis not present

## 2021-01-25 DIAGNOSIS — F331 Major depressive disorder, recurrent, moderate: Secondary | ICD-10-CM | POA: Diagnosis not present

## 2021-01-26 ENCOUNTER — Ambulatory Visit (HOSPITAL_COMMUNITY): Payer: BC Managed Care – PPO | Admitting: Licensed Clinical Social Worker

## 2021-01-26 DIAGNOSIS — F900 Attention-deficit hyperactivity disorder, predominantly inattentive type: Secondary | ICD-10-CM | POA: Diagnosis not present

## 2021-01-26 DIAGNOSIS — F331 Major depressive disorder, recurrent, moderate: Secondary | ICD-10-CM | POA: Diagnosis not present

## 2021-01-26 DIAGNOSIS — F1020 Alcohol dependence, uncomplicated: Secondary | ICD-10-CM | POA: Diagnosis not present

## 2021-01-27 DIAGNOSIS — F900 Attention-deficit hyperactivity disorder, predominantly inattentive type: Secondary | ICD-10-CM | POA: Diagnosis not present

## 2021-01-27 DIAGNOSIS — F1020 Alcohol dependence, uncomplicated: Secondary | ICD-10-CM | POA: Diagnosis not present

## 2021-01-27 DIAGNOSIS — F331 Major depressive disorder, recurrent, moderate: Secondary | ICD-10-CM | POA: Diagnosis not present

## 2021-01-28 DIAGNOSIS — F1020 Alcohol dependence, uncomplicated: Secondary | ICD-10-CM | POA: Diagnosis not present

## 2021-01-28 DIAGNOSIS — F331 Major depressive disorder, recurrent, moderate: Secondary | ICD-10-CM | POA: Diagnosis not present

## 2021-01-28 DIAGNOSIS — F900 Attention-deficit hyperactivity disorder, predominantly inattentive type: Secondary | ICD-10-CM | POA: Diagnosis not present

## 2021-01-29 DIAGNOSIS — F1020 Alcohol dependence, uncomplicated: Secondary | ICD-10-CM | POA: Diagnosis not present

## 2021-01-29 DIAGNOSIS — F900 Attention-deficit hyperactivity disorder, predominantly inattentive type: Secondary | ICD-10-CM | POA: Diagnosis not present

## 2021-01-29 DIAGNOSIS — F331 Major depressive disorder, recurrent, moderate: Secondary | ICD-10-CM | POA: Diagnosis not present

## 2021-01-30 DIAGNOSIS — F900 Attention-deficit hyperactivity disorder, predominantly inattentive type: Secondary | ICD-10-CM | POA: Diagnosis not present

## 2021-01-30 DIAGNOSIS — F331 Major depressive disorder, recurrent, moderate: Secondary | ICD-10-CM | POA: Diagnosis not present

## 2021-01-30 DIAGNOSIS — F1020 Alcohol dependence, uncomplicated: Secondary | ICD-10-CM | POA: Diagnosis not present

## 2021-01-31 DIAGNOSIS — F1020 Alcohol dependence, uncomplicated: Secondary | ICD-10-CM | POA: Diagnosis not present

## 2021-01-31 DIAGNOSIS — F900 Attention-deficit hyperactivity disorder, predominantly inattentive type: Secondary | ICD-10-CM | POA: Diagnosis not present

## 2021-01-31 DIAGNOSIS — F331 Major depressive disorder, recurrent, moderate: Secondary | ICD-10-CM | POA: Diagnosis not present

## 2021-02-01 ENCOUNTER — Ambulatory Visit: Payer: BC Managed Care – PPO | Admitting: Family Medicine

## 2021-02-01 DIAGNOSIS — F331 Major depressive disorder, recurrent, moderate: Secondary | ICD-10-CM | POA: Diagnosis not present

## 2021-02-01 DIAGNOSIS — Z0289 Encounter for other administrative examinations: Secondary | ICD-10-CM

## 2021-02-01 DIAGNOSIS — F900 Attention-deficit hyperactivity disorder, predominantly inattentive type: Secondary | ICD-10-CM | POA: Diagnosis not present

## 2021-02-01 DIAGNOSIS — F1020 Alcohol dependence, uncomplicated: Secondary | ICD-10-CM | POA: Diagnosis not present

## 2021-02-02 DIAGNOSIS — F331 Major depressive disorder, recurrent, moderate: Secondary | ICD-10-CM | POA: Diagnosis not present

## 2021-02-02 DIAGNOSIS — F900 Attention-deficit hyperactivity disorder, predominantly inattentive type: Secondary | ICD-10-CM | POA: Diagnosis not present

## 2021-02-02 DIAGNOSIS — F1020 Alcohol dependence, uncomplicated: Secondary | ICD-10-CM | POA: Diagnosis not present

## 2021-02-03 DIAGNOSIS — F331 Major depressive disorder, recurrent, moderate: Secondary | ICD-10-CM | POA: Diagnosis not present

## 2021-02-03 DIAGNOSIS — F900 Attention-deficit hyperactivity disorder, predominantly inattentive type: Secondary | ICD-10-CM | POA: Diagnosis not present

## 2021-02-03 DIAGNOSIS — F1020 Alcohol dependence, uncomplicated: Secondary | ICD-10-CM | POA: Diagnosis not present

## 2021-02-04 DIAGNOSIS — F900 Attention-deficit hyperactivity disorder, predominantly inattentive type: Secondary | ICD-10-CM | POA: Diagnosis not present

## 2021-02-04 DIAGNOSIS — F331 Major depressive disorder, recurrent, moderate: Secondary | ICD-10-CM | POA: Diagnosis not present

## 2021-02-04 DIAGNOSIS — F1020 Alcohol dependence, uncomplicated: Secondary | ICD-10-CM | POA: Diagnosis not present

## 2021-02-05 DIAGNOSIS — F900 Attention-deficit hyperactivity disorder, predominantly inattentive type: Secondary | ICD-10-CM | POA: Diagnosis not present

## 2021-02-05 DIAGNOSIS — F1020 Alcohol dependence, uncomplicated: Secondary | ICD-10-CM | POA: Diagnosis not present

## 2021-02-05 DIAGNOSIS — F331 Major depressive disorder, recurrent, moderate: Secondary | ICD-10-CM | POA: Diagnosis not present

## 2021-02-06 DIAGNOSIS — F1020 Alcohol dependence, uncomplicated: Secondary | ICD-10-CM | POA: Diagnosis not present

## 2021-02-06 DIAGNOSIS — F331 Major depressive disorder, recurrent, moderate: Secondary | ICD-10-CM | POA: Diagnosis not present

## 2021-02-06 DIAGNOSIS — F900 Attention-deficit hyperactivity disorder, predominantly inattentive type: Secondary | ICD-10-CM | POA: Diagnosis not present

## 2021-02-07 DIAGNOSIS — F331 Major depressive disorder, recurrent, moderate: Secondary | ICD-10-CM | POA: Diagnosis not present

## 2021-02-07 DIAGNOSIS — F1020 Alcohol dependence, uncomplicated: Secondary | ICD-10-CM | POA: Diagnosis not present

## 2021-02-07 DIAGNOSIS — F900 Attention-deficit hyperactivity disorder, predominantly inattentive type: Secondary | ICD-10-CM | POA: Diagnosis not present

## 2021-02-08 DIAGNOSIS — F1020 Alcohol dependence, uncomplicated: Secondary | ICD-10-CM | POA: Diagnosis not present

## 2021-02-08 DIAGNOSIS — F331 Major depressive disorder, recurrent, moderate: Secondary | ICD-10-CM | POA: Diagnosis not present

## 2021-02-08 DIAGNOSIS — F900 Attention-deficit hyperactivity disorder, predominantly inattentive type: Secondary | ICD-10-CM | POA: Diagnosis not present

## 2021-02-09 DIAGNOSIS — F1020 Alcohol dependence, uncomplicated: Secondary | ICD-10-CM | POA: Diagnosis not present

## 2021-02-09 DIAGNOSIS — F900 Attention-deficit hyperactivity disorder, predominantly inattentive type: Secondary | ICD-10-CM | POA: Diagnosis not present

## 2021-02-09 DIAGNOSIS — F331 Major depressive disorder, recurrent, moderate: Secondary | ICD-10-CM | POA: Diagnosis not present

## 2021-02-10 ENCOUNTER — Telehealth: Payer: Self-pay

## 2021-02-10 DIAGNOSIS — F331 Major depressive disorder, recurrent, moderate: Secondary | ICD-10-CM | POA: Diagnosis not present

## 2021-02-10 DIAGNOSIS — F1020 Alcohol dependence, uncomplicated: Secondary | ICD-10-CM | POA: Diagnosis not present

## 2021-02-10 DIAGNOSIS — F900 Attention-deficit hyperactivity disorder, predominantly inattentive type: Secondary | ICD-10-CM | POA: Diagnosis not present

## 2021-02-10 NOTE — Telephone Encounter (Signed)
See note

## 2021-02-10 NOTE — Telephone Encounter (Signed)
Not able to accept at this time.  Algis Greenhouse. Jerline Pain, MD 02/10/2021 1:06 PM

## 2021-02-10 NOTE — Telephone Encounter (Signed)
Patients daughter is calling to reschedule  New patient apt. Daughter stated patient has been in hospital and daughter is taking care of all medical issues at the moment.  Patient does not have access to phone  Please advise if we are able to reschedule  Thank you

## 2021-02-10 NOTE — Telephone Encounter (Signed)
Please advise 

## 2021-02-11 DIAGNOSIS — F331 Major depressive disorder, recurrent, moderate: Secondary | ICD-10-CM | POA: Diagnosis not present

## 2021-02-11 DIAGNOSIS — F900 Attention-deficit hyperactivity disorder, predominantly inattentive type: Secondary | ICD-10-CM | POA: Diagnosis not present

## 2021-02-11 DIAGNOSIS — F1020 Alcohol dependence, uncomplicated: Secondary | ICD-10-CM | POA: Diagnosis not present

## 2021-02-11 NOTE — Telephone Encounter (Signed)
Patients daughter informed

## 2021-02-12 DIAGNOSIS — F900 Attention-deficit hyperactivity disorder, predominantly inattentive type: Secondary | ICD-10-CM | POA: Diagnosis not present

## 2021-02-12 DIAGNOSIS — F331 Major depressive disorder, recurrent, moderate: Secondary | ICD-10-CM | POA: Diagnosis not present

## 2021-02-12 DIAGNOSIS — F1020 Alcohol dependence, uncomplicated: Secondary | ICD-10-CM | POA: Diagnosis not present

## 2021-02-13 DIAGNOSIS — F331 Major depressive disorder, recurrent, moderate: Secondary | ICD-10-CM | POA: Diagnosis not present

## 2021-02-13 DIAGNOSIS — F900 Attention-deficit hyperactivity disorder, predominantly inattentive type: Secondary | ICD-10-CM | POA: Diagnosis not present

## 2021-02-13 DIAGNOSIS — F1020 Alcohol dependence, uncomplicated: Secondary | ICD-10-CM | POA: Diagnosis not present

## 2021-02-14 DIAGNOSIS — F900 Attention-deficit hyperactivity disorder, predominantly inattentive type: Secondary | ICD-10-CM | POA: Diagnosis not present

## 2021-02-14 DIAGNOSIS — F1020 Alcohol dependence, uncomplicated: Secondary | ICD-10-CM | POA: Diagnosis not present

## 2021-02-14 DIAGNOSIS — F331 Major depressive disorder, recurrent, moderate: Secondary | ICD-10-CM | POA: Diagnosis not present

## 2021-02-15 DIAGNOSIS — F331 Major depressive disorder, recurrent, moderate: Secondary | ICD-10-CM | POA: Diagnosis not present

## 2021-02-15 DIAGNOSIS — F900 Attention-deficit hyperactivity disorder, predominantly inattentive type: Secondary | ICD-10-CM | POA: Diagnosis not present

## 2021-02-15 DIAGNOSIS — F1020 Alcohol dependence, uncomplicated: Secondary | ICD-10-CM | POA: Diagnosis not present

## 2021-02-16 DIAGNOSIS — F1020 Alcohol dependence, uncomplicated: Secondary | ICD-10-CM | POA: Diagnosis not present

## 2021-02-16 DIAGNOSIS — F331 Major depressive disorder, recurrent, moderate: Secondary | ICD-10-CM | POA: Diagnosis not present

## 2021-02-16 DIAGNOSIS — F900 Attention-deficit hyperactivity disorder, predominantly inattentive type: Secondary | ICD-10-CM | POA: Diagnosis not present

## 2021-02-17 DIAGNOSIS — F331 Major depressive disorder, recurrent, moderate: Secondary | ICD-10-CM | POA: Diagnosis not present

## 2021-02-17 DIAGNOSIS — F900 Attention-deficit hyperactivity disorder, predominantly inattentive type: Secondary | ICD-10-CM | POA: Diagnosis not present

## 2021-02-17 DIAGNOSIS — F1020 Alcohol dependence, uncomplicated: Secondary | ICD-10-CM | POA: Diagnosis not present

## 2021-02-18 DIAGNOSIS — F1020 Alcohol dependence, uncomplicated: Secondary | ICD-10-CM | POA: Diagnosis not present

## 2021-02-18 DIAGNOSIS — F900 Attention-deficit hyperactivity disorder, predominantly inattentive type: Secondary | ICD-10-CM | POA: Diagnosis not present

## 2021-02-18 DIAGNOSIS — F331 Major depressive disorder, recurrent, moderate: Secondary | ICD-10-CM | POA: Diagnosis not present

## 2021-02-19 DIAGNOSIS — F331 Major depressive disorder, recurrent, moderate: Secondary | ICD-10-CM | POA: Diagnosis not present

## 2021-02-19 DIAGNOSIS — F900 Attention-deficit hyperactivity disorder, predominantly inattentive type: Secondary | ICD-10-CM | POA: Diagnosis not present

## 2021-02-19 DIAGNOSIS — F1020 Alcohol dependence, uncomplicated: Secondary | ICD-10-CM | POA: Diagnosis not present

## 2021-02-20 DIAGNOSIS — F900 Attention-deficit hyperactivity disorder, predominantly inattentive type: Secondary | ICD-10-CM | POA: Diagnosis not present

## 2021-02-20 DIAGNOSIS — F331 Major depressive disorder, recurrent, moderate: Secondary | ICD-10-CM | POA: Diagnosis not present

## 2021-02-20 DIAGNOSIS — F1020 Alcohol dependence, uncomplicated: Secondary | ICD-10-CM | POA: Diagnosis not present

## 2021-02-21 ENCOUNTER — Ambulatory Visit: Payer: BC Managed Care – PPO | Admitting: Psychiatry

## 2021-02-21 DIAGNOSIS — F1020 Alcohol dependence, uncomplicated: Secondary | ICD-10-CM | POA: Diagnosis not present

## 2021-02-21 DIAGNOSIS — F900 Attention-deficit hyperactivity disorder, predominantly inattentive type: Secondary | ICD-10-CM | POA: Diagnosis not present

## 2021-02-21 DIAGNOSIS — F331 Major depressive disorder, recurrent, moderate: Secondary | ICD-10-CM | POA: Diagnosis not present

## 2021-02-22 DIAGNOSIS — F331 Major depressive disorder, recurrent, moderate: Secondary | ICD-10-CM | POA: Diagnosis not present

## 2021-02-22 DIAGNOSIS — F1020 Alcohol dependence, uncomplicated: Secondary | ICD-10-CM | POA: Diagnosis not present

## 2021-02-22 DIAGNOSIS — F900 Attention-deficit hyperactivity disorder, predominantly inattentive type: Secondary | ICD-10-CM | POA: Diagnosis not present

## 2021-02-23 DIAGNOSIS — F900 Attention-deficit hyperactivity disorder, predominantly inattentive type: Secondary | ICD-10-CM | POA: Diagnosis not present

## 2021-02-23 DIAGNOSIS — F331 Major depressive disorder, recurrent, moderate: Secondary | ICD-10-CM | POA: Diagnosis not present

## 2021-02-23 DIAGNOSIS — F1020 Alcohol dependence, uncomplicated: Secondary | ICD-10-CM | POA: Diagnosis not present

## 2021-02-24 DIAGNOSIS — F1020 Alcohol dependence, uncomplicated: Secondary | ICD-10-CM | POA: Diagnosis not present

## 2021-02-24 DIAGNOSIS — F331 Major depressive disorder, recurrent, moderate: Secondary | ICD-10-CM | POA: Diagnosis not present

## 2021-02-24 DIAGNOSIS — F900 Attention-deficit hyperactivity disorder, predominantly inattentive type: Secondary | ICD-10-CM | POA: Diagnosis not present

## 2021-02-25 DIAGNOSIS — F1020 Alcohol dependence, uncomplicated: Secondary | ICD-10-CM | POA: Diagnosis not present

## 2021-02-25 DIAGNOSIS — F331 Major depressive disorder, recurrent, moderate: Secondary | ICD-10-CM | POA: Diagnosis not present

## 2021-02-25 DIAGNOSIS — F900 Attention-deficit hyperactivity disorder, predominantly inattentive type: Secondary | ICD-10-CM | POA: Diagnosis not present

## 2021-02-26 DIAGNOSIS — F331 Major depressive disorder, recurrent, moderate: Secondary | ICD-10-CM | POA: Diagnosis not present

## 2021-02-26 DIAGNOSIS — F1020 Alcohol dependence, uncomplicated: Secondary | ICD-10-CM | POA: Diagnosis not present

## 2021-02-26 DIAGNOSIS — F900 Attention-deficit hyperactivity disorder, predominantly inattentive type: Secondary | ICD-10-CM | POA: Diagnosis not present

## 2021-02-27 DIAGNOSIS — F900 Attention-deficit hyperactivity disorder, predominantly inattentive type: Secondary | ICD-10-CM | POA: Diagnosis not present

## 2021-02-27 DIAGNOSIS — F1020 Alcohol dependence, uncomplicated: Secondary | ICD-10-CM | POA: Diagnosis not present

## 2021-02-27 DIAGNOSIS — F331 Major depressive disorder, recurrent, moderate: Secondary | ICD-10-CM | POA: Diagnosis not present

## 2021-02-28 DIAGNOSIS — F900 Attention-deficit hyperactivity disorder, predominantly inattentive type: Secondary | ICD-10-CM | POA: Diagnosis not present

## 2021-02-28 DIAGNOSIS — F1020 Alcohol dependence, uncomplicated: Secondary | ICD-10-CM | POA: Diagnosis not present

## 2021-02-28 DIAGNOSIS — F331 Major depressive disorder, recurrent, moderate: Secondary | ICD-10-CM | POA: Diagnosis not present

## 2021-03-01 DIAGNOSIS — F331 Major depressive disorder, recurrent, moderate: Secondary | ICD-10-CM | POA: Diagnosis not present

## 2021-03-01 DIAGNOSIS — F900 Attention-deficit hyperactivity disorder, predominantly inattentive type: Secondary | ICD-10-CM | POA: Diagnosis not present

## 2021-03-01 DIAGNOSIS — F1020 Alcohol dependence, uncomplicated: Secondary | ICD-10-CM | POA: Diagnosis not present

## 2021-03-02 DIAGNOSIS — F900 Attention-deficit hyperactivity disorder, predominantly inattentive type: Secondary | ICD-10-CM | POA: Diagnosis not present

## 2021-03-02 DIAGNOSIS — F1020 Alcohol dependence, uncomplicated: Secondary | ICD-10-CM | POA: Diagnosis not present

## 2021-03-02 DIAGNOSIS — F331 Major depressive disorder, recurrent, moderate: Secondary | ICD-10-CM | POA: Diagnosis not present

## 2021-03-03 DIAGNOSIS — F900 Attention-deficit hyperactivity disorder, predominantly inattentive type: Secondary | ICD-10-CM | POA: Diagnosis not present

## 2021-03-03 DIAGNOSIS — F1020 Alcohol dependence, uncomplicated: Secondary | ICD-10-CM | POA: Diagnosis not present

## 2021-03-03 DIAGNOSIS — F331 Major depressive disorder, recurrent, moderate: Secondary | ICD-10-CM | POA: Diagnosis not present

## 2021-03-04 DIAGNOSIS — F1020 Alcohol dependence, uncomplicated: Secondary | ICD-10-CM | POA: Diagnosis not present

## 2021-03-04 DIAGNOSIS — F900 Attention-deficit hyperactivity disorder, predominantly inattentive type: Secondary | ICD-10-CM | POA: Diagnosis not present

## 2021-03-04 DIAGNOSIS — F331 Major depressive disorder, recurrent, moderate: Secondary | ICD-10-CM | POA: Diagnosis not present

## 2021-03-07 DIAGNOSIS — F1020 Alcohol dependence, uncomplicated: Secondary | ICD-10-CM | POA: Diagnosis not present

## 2021-03-07 DIAGNOSIS — F900 Attention-deficit hyperactivity disorder, predominantly inattentive type: Secondary | ICD-10-CM | POA: Diagnosis not present

## 2021-03-07 DIAGNOSIS — F331 Major depressive disorder, recurrent, moderate: Secondary | ICD-10-CM | POA: Diagnosis not present

## 2021-03-08 DIAGNOSIS — F331 Major depressive disorder, recurrent, moderate: Secondary | ICD-10-CM | POA: Diagnosis not present

## 2021-03-08 DIAGNOSIS — F1020 Alcohol dependence, uncomplicated: Secondary | ICD-10-CM | POA: Diagnosis not present

## 2021-03-08 DIAGNOSIS — F900 Attention-deficit hyperactivity disorder, predominantly inattentive type: Secondary | ICD-10-CM | POA: Diagnosis not present

## 2021-03-10 DIAGNOSIS — E782 Mixed hyperlipidemia: Secondary | ICD-10-CM | POA: Diagnosis not present

## 2021-03-10 DIAGNOSIS — Z0000 Encounter for general adult medical examination without abnormal findings: Secondary | ICD-10-CM | POA: Diagnosis not present

## 2021-03-10 DIAGNOSIS — Z5181 Encounter for therapeutic drug level monitoring: Secondary | ICD-10-CM | POA: Diagnosis not present

## 2021-03-10 DIAGNOSIS — E611 Iron deficiency: Secondary | ICD-10-CM | POA: Diagnosis not present

## 2021-03-14 DIAGNOSIS — R309 Painful micturition, unspecified: Secondary | ICD-10-CM | POA: Diagnosis not present

## 2021-03-14 DIAGNOSIS — N952 Postmenopausal atrophic vaginitis: Secondary | ICD-10-CM | POA: Diagnosis not present

## 2021-03-14 DIAGNOSIS — N941 Unspecified dyspareunia: Secondary | ICD-10-CM | POA: Diagnosis not present

## 2021-03-14 DIAGNOSIS — D259 Leiomyoma of uterus, unspecified: Secondary | ICD-10-CM | POA: Diagnosis not present

## 2021-03-29 ENCOUNTER — Encounter: Payer: Self-pay | Admitting: Psychiatry

## 2021-03-29 ENCOUNTER — Other Ambulatory Visit: Payer: Self-pay

## 2021-03-29 ENCOUNTER — Ambulatory Visit (INDEPENDENT_AMBULATORY_CARE_PROVIDER_SITE_OTHER): Payer: BC Managed Care – PPO | Admitting: Psychiatry

## 2021-03-29 DIAGNOSIS — F32A Depression, unspecified: Secondary | ICD-10-CM | POA: Diagnosis not present

## 2021-03-29 DIAGNOSIS — G4700 Insomnia, unspecified: Secondary | ICD-10-CM | POA: Diagnosis not present

## 2021-03-29 DIAGNOSIS — F411 Generalized anxiety disorder: Secondary | ICD-10-CM | POA: Diagnosis not present

## 2021-03-29 MED ORDER — DAYVIGO 5 MG PO TABS: 5.0000 mg | ORAL_TABLET | Freq: Every day | ORAL | 0 refills | Status: DC

## 2021-03-29 MED ORDER — DULOXETINE HCL 60 MG PO CPEP: 60.0000 mg | ORAL_CAPSULE | Freq: Every day | ORAL | 1 refills | Status: DC

## 2021-03-29 NOTE — Progress Notes (Signed)
Debra Rhodes [id number] 1961/07/14 60 y.o.  Subjective:   Patient ID:  Debra Rhodes is a 60 y.o. (DOB [birth year]) female.  Chief Complaint:  Chief Complaint  Patient presents with  . Anxiety  . Depression  . Insomnia    HPI Debra Rhodes presents to the office today for follow-up of anxiety and depression. Pt seen for follow-up after psychiatric hospitalization. Went to ED 01/17/21. Admitted to [facility] behavioral health and then went to [facility] in [place] for 6 weeks. She reports that she had some suicidal ideation and depressed. She reports that she started drinking and became suicidal. She was then admitted to the hospital and continued to have SI. She is now on Cymbalta and feels like this is somewhat effective. Cymbalta was started at 40 mg and increased to 60 mg about 2 weeks ago. She reports that she has some depression, "just not as low." She reports that she has some anxiety about the future and some hopeless thoughts. Anxiety was less while she was in treatment. She reports that has increased anxiety with return to work. She denies panic s/s. Sleeping well. Appetite has been good. She reports that last week energy and motivation were good. Energy and motivation were lower yesterday and better today. Concentration changes with her mood. Denies SI.   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 She returned to work last week. Having to travel by car frequently.   Reports that she was prescribed hydroxyzine prn and stopped gabapentin.  Last ETOH use was March 1st. Having cravings to drink. Going to [place] and has a sponsor. Has apt next week to start therapy with Rushie Goltz.   Past Psychiatric Medication Trials: Paxil- Has taken it since 60 yo. Has had times where it has been less effective.Had wt gain and sexual side effects at higher doses (40 mg and 60 mg). Prozac- felt more anxious Sertraline Cymbalta- Started on  40 mg and ws increased to 60 mg. Wellbutrin- increased irritability Trazodone-helpful but causes some excessive somnolence, especially at 100 mg dose.  Remeron- Effective, wt gain Hydroxyzine Strattera- Effective but feels "wired" Gabapentin- Takes as needed for anxiety and leg cramps.Occ word finding errors with more than 300 mg Buspar-Increased agitation and anxiety Concerta- Had increased anxiety     Flowsheet Row ED from 01/17/2021 in [facility] DEPT  C-SSRS RISK CATEGORY High Risk       Review of Systems:  Review of Systems  Musculoskeletal: Negative for gait problem.  Neurological: Negative for tremors.  Psychiatric/Behavioral:       Please refer to HPI    Medications: I have reviewed the patient's current medications.  Current Outpatient Medications  Medication Sig Dispense Refill  . cetirizine (ZYRTEC) 10 MG tablet Take 10 mg by mouth daily.    . Cyanocobalamin (VITAMIN B 12 PO) Take by mouth.    . DULoxetine (CYMBALTA) 60 MG capsule Take 1 capsule (60 mg total) by mouth daily. 30 capsule 1  . estradiol-norethindrone (COMBIPATCH) 0.05-0.14 MG/DAY Place 1 patch onto the skin once a week.    . Ferrous Sulfate (IRON PO) Take by mouth.    . mirtazapine (REMERON) 15 MG tablet Take 15 mg by mouth at bedtime.    . Multiple Vitamin (MULTIVITAMIN) tablet Take 1 tablet by mouth daily.    Marland Kitchen VITAMIN D PO Take 1 tablet by mouth daily.    Marland Kitchen antiseptic oral rinse (BIOTENE) LIQD 15 mLs  by Mouth Rinse route as needed for dry mouth. 237 mL 0  . gabapentin (NEURONTIN) 300 MG capsule Take 1 capsule (300 mg total) by mouth 3 (three) times daily. (Patient not taking: Reported on 03/29/2021) 90 capsule 0  . Lemborexant (DAYVIGO) 5 MG TABS Take 5 mg by mouth at bedtime. Can increase to 10 mg po QHS after 3-5 days if 5 mg is ineffective. Please use voucher in pharmacy notes 10 tablet 0  . polyvinyl alcohol (LIQUIFILM TEARS) 1.4 % ophthalmic solution Place 1 drop  into both eyes as needed for dry eyes.     No current facility-administered medications for this visit.    Medication Side Effects: Other: Wt. gain, hot flashes  Allergies:  Allergies  Allergen Reactions  . Latex Shortness Of Breath, Swelling and Rash    Past Medical History:  Diagnosis Date  . Anxiety   . Depression   . PONV (postoperative nausea and vomiting)     Past Medical History, Surgical history, Social history, and Family history were reviewed and updated as appropriate.   Please see review of systems for further details on the patient's review from today.   Objective:   Physical Exam:  There were no vitals taken for this visit.  Physical Exam Constitutional:      General: She is not in acute distress. Musculoskeletal:        General: No deformity.  Neurological:     Mental Status: She is alert and oriented to person, place, and time.     Coordination: Coordination normal.  Psychiatric:        Attention and Perception: Attention and perception normal. She does not perceive auditory or visual hallucinations.        Mood and Affect: Mood is anxious and depressed. Affect is not labile, blunt, angry or inappropriate.        Speech: Speech normal.        Behavior: Behavior normal. Behavior is cooperative.        Thought Content: Thought content normal. Thought content is not paranoid or delusional. Thought content does not include homicidal or suicidal ideation. Thought content does not include homicidal or suicidal plan.        Cognition and Memory: Cognition and memory normal.        Judgment: Judgment normal.     Comments: Insight intact     Lab Review:     Component Value Date/Time   NA 138 01/23/2021 0632   K 4.2 01/23/2021 0632   CL 101 01/23/2021 0632   CO2 30 01/23/2021 0632   GLUCOSE 91 01/23/2021 0632   BUN 14 01/23/2021 0632   CREATININE 0.70 01/23/2021 0632   CALCIUM 9.2 01/23/2021 0632   PROT 6.8 01/22/2021 0648   ALBUMIN 4.1 01/22/2021  0648   AST 40 01/22/2021 0648   ALT 30 01/22/2021 0648   ALKPHOS 42 01/22/2021 0648   BILITOT 0.9 01/22/2021 0648   GFRNONAA >60 01/23/2021 0632   GFRAA >60 05/14/2019 0317       Component Value Date/Time   WBC 3.9 (L) 01/22/2021 0648   RBC 4.44 01/22/2021 0648   HGB 13.8 01/22/2021 0648   HCT 41.7 01/22/2021 0648   PLT 198 01/22/2021 0648   MCV 93.9 01/22/2021 0648   MCH 31.1 01/22/2021 0648   MCHC 33.1 01/22/2021 0648   RDW 12.7 01/22/2021 0648   LYMPHSABS 1.4 01/22/2021 0648   MONOABS 0.4 01/22/2021 0648   EOSABS 0.2 01/22/2021 0648   BASOSABS 0.0  01/22/2021 0648    No results found for: POCLITH, LITHIUM   No results found for: PHENYTOIN, PHENOBARB, VALPROATE, CBMZ   .res Assessment: Plan:   Patient seen for 45 minutes for hospital discharge follow-up.  Time spent reviewing events leading up to admission, psychiatric hospital, and residential treatment.  Reviewed changes in medications and discussed potential benefits, risks, and side effects of current medications.  Patient reports that she has been experiencing weight gain with mirtazapine and would prefer to try a different medication for sleep. Discussed potential benefits, risks, and and side effects of Dayvigo for insomnia.  Patient agrees to trial of Dayvigo.  Patient provided with Dayvigo 5 mg samples and advised to take 1 tablet by mouth at bedtime, and to increase to 2 tabs after 3 to 5 days if 5 mg dose is ineffective.  Patient advised to contact office if Dayvigo is effective and to request a script for the dose that is effective. Discussed potential benefits, risks, and side effects of Belsomra if Dayvigo is not covered.  Discussed that more time is needed to determine response to increase in Cymbalta to 60 mg daily.  Pt to follow-up in one month or sooner if clinically indicated.  Patient advised to contact office with any questions, adverse effects, or acute worsening in signs and symptoms.  Cianni was seen today  for anxiety, depression and insomnia.  Diagnoses and all orders for this visit:  Insomnia, unspecified type -     Discontinue: Lemborexant (DAYVIGO) 5 MG TABS; Take 5 mg by mouth at bedtime. Can increase to 10 mg po QHS after 3-5 days if 5 mg is ineffective -     Lemborexant (DAYVIGO) 5 MG TABS; Take 5 mg by mouth at bedtime. Can increase to 10 mg po QHS after 3-5 days if 5 mg is ineffective. Please use voucher in pharmacy notes  Generalized anxiety disorder -     DULoxetine (CYMBALTA) 60 MG capsule; Take 1 capsule (60 mg total) by mouth daily.  Depression, unspecified depression type -     DULoxetine (CYMBALTA) 60 MG capsule; Take 1 capsule (60 mg total) by mouth daily.     Please see After Visit Summary for patient specific instructions.  Future Appointments  Date Time Provider [place]  04/26/2021  3:00 PM Thayer Headings, PMHNP CP-CP None    No orders of the defined types were placed in this encounter.   -------------------------------

## 2021-03-30 ENCOUNTER — Telehealth: Payer: Self-pay | Admitting: Psychiatry

## 2021-03-30 DIAGNOSIS — G4700 Insomnia, unspecified: Secondary | ICD-10-CM

## 2021-03-30 NOTE — Telephone Encounter (Signed)
Please review

## 2021-03-30 NOTE — Telephone Encounter (Signed)
Pt left a message stating that the dayvigo sleep medicine did not get sent to the pharmacy. Please resnd

## 2021-04-04 ENCOUNTER — Telehealth: Payer: Self-pay | Admitting: Psychiatry

## 2021-04-04 MED ORDER — DAYVIGO 5 MG PO TABS: 5.0000 mg | ORAL_TABLET | Freq: Every day | ORAL | 0 refills | Status: DC

## 2021-04-04 NOTE — Telephone Encounter (Signed)
Debra Rhodes called and said that Debra Rhodes on [address] never got the [employer] last week when she came in. Debra Rhodes never got the prescription. Can this be resent to them please? It was supposed to be a voucher for the samples. Pharmacy is:   [facility] [address], [place] Phone:  [phone]  Fax:  [phone]

## 2021-04-04 NOTE — Telephone Encounter (Signed)
Attempted to contact pt to inquire which dose of Dayvigo samples have been most effective for her. L/M that plan had been to start with samples and to then let office know which strength was effective and then a script could be sent to her pharmacy. Requested that she contact office or reply to [place] from 03/30/21 to clarify if she needs a script for the 5 mg or 10 mg dose.

## 2021-04-04 NOTE — Telephone Encounter (Signed)
Please review

## 2021-04-06 ENCOUNTER — Other Ambulatory Visit: Payer: Self-pay | Admitting: Family Medicine

## 2021-04-06 DIAGNOSIS — Z1231 Encounter for screening mammogram for malignant neoplasm of breast: Secondary | ICD-10-CM

## 2021-04-08 NOTE — Telephone Encounter (Signed)
Debra Rhodes, I believe from the first message she has not been able to get any samples to try with the voucher yet. She was requesting to resend?   I did leave a msg as well.

## 2021-04-08 NOTE — Telephone Encounter (Signed)
Noted thanks °

## 2021-04-08 NOTE — Telephone Encounter (Signed)
I contacted pt through [facility] and I think resolved the issue on 04/05/21.

## 2021-04-13 ENCOUNTER — Other Ambulatory Visit: Payer: Self-pay | Admitting: Psychiatry

## 2021-04-13 ENCOUNTER — Other Ambulatory Visit (HOSPITAL_COMMUNITY): Payer: Self-pay

## 2021-04-13 DIAGNOSIS — H1030 Unspecified acute conjunctivitis, unspecified eye: Secondary | ICD-10-CM | POA: Diagnosis not present

## 2021-04-13 DIAGNOSIS — G4700 Insomnia, unspecified: Secondary | ICD-10-CM

## 2021-04-13 MED ORDER — OFLOXACIN 0.3 % OP SOLN
OPHTHALMIC | 0 refills | Status: DC
  Filled 2021-04-13: qty 5, 7d supply, fill #0

## 2021-04-26 ENCOUNTER — Encounter: Payer: Self-pay | Admitting: Psychiatry

## 2021-04-26 ENCOUNTER — Telehealth (INDEPENDENT_AMBULATORY_CARE_PROVIDER_SITE_OTHER): Payer: BC Managed Care – PPO | Admitting: Psychiatry

## 2021-04-26 DIAGNOSIS — F902 Attention-deficit hyperactivity disorder, combined type: Secondary | ICD-10-CM | POA: Diagnosis not present

## 2021-04-26 DIAGNOSIS — G4700 Insomnia, unspecified: Secondary | ICD-10-CM

## 2021-04-26 DIAGNOSIS — F32A Depression, unspecified: Secondary | ICD-10-CM | POA: Diagnosis not present

## 2021-04-26 DIAGNOSIS — F411 Generalized anxiety disorder: Secondary | ICD-10-CM | POA: Diagnosis not present

## 2021-04-26 MED ORDER — DULOXETINE HCL 30 MG PO CPEP: 30.0000 mg | ORAL_CAPSULE | Freq: Every day | ORAL | 1 refills | Status: DC

## 2021-04-26 MED ORDER — METHYLPHENIDATE HCL ER (OSM) 18 MG PO TBCR: 18.0000 mg | EXTENDED_RELEASE_TABLET | Freq: Every day | ORAL | 0 refills | Status: DC

## 2021-04-26 MED ORDER — METHYLPHENIDATE HCL ER 18 MG PO TB24: 18.0000 mg | ORAL_TABLET | Freq: Every day | ORAL | 0 refills | Status: DC

## 2021-04-26 MED ORDER — DULOXETINE HCL 60 MG PO CPEP
60.0000 mg | ORAL_CAPSULE | Freq: Every day | ORAL | 1 refills | Status: DC
Start: 2021-04-26 — End: 2021-06-14

## 2021-04-26 MED ORDER — DAYVIGO 5 MG PO TABS: 5.0000 mg | ORAL_TABLET | Freq: Every evening | ORAL | 2 refills | Status: DC

## 2021-04-26 NOTE — Progress Notes (Signed)
Debra Rhodes [id number] 1961-08-07 60 y.o.  Virtual Visit via Telephone Note  I connected with pt on 04/26/21 at  3:00 PM EDT by telephone and verified that I am speaking with the correct person using two identifiers.   I discussed the limitations, risks, security and privacy concerns of performing an evaluation and management service by telephone and the availability of in person appointments. I also discussed with the patient that there may be a patient responsible charge related to this service. The patient expressed understanding and agreed to proceed.   I discussed the assessment and treatment plan with the patient. The patient was provided an opportunity to ask questions and all were answered. The patient agreed with the plan and demonstrated an understanding of the instructions.   The patient was advised to call back or seek an in-person evaluation if the symptoms worsen or if the condition fails to improve as anticipated.  I provided 30 minutes of non-face-to-face time during this encounter.  The patient was located at home.  The provider was located at [place].   Debra Rhodes, PMHNP   Subjective:   Patient ID:  Debra Rhodes is a 60 y.o. (DOB [birth year]) female.  Chief Complaint:  Chief Complaint  Patient presents with  . Depression  . Anxiety  . Insomnia    HPI Debra Rhodes presents for follow-up of anxiety, depression, and insomnia. She reports that she has been "better." She feels that Cymbalta has been helpful for her. "I might have down days, but nothing like before." She reports that she has low energy, low motivation, and wanting to sleep more at times. She reports that low energy and motivation are affecting function some, ie. Is watching TV instead of being productive on days and is "behind" at work but continuing to do requirements. Some diminished joy. Sleeping an adequate amount, about 7-9 hours. She reports that Debra Rhodes has been helpful for  her sleep. She reports poor concentration and started taking Concerta 18 mg again and this has been helpful. She reports that she has anxiety in response to work stress and social anxiety. Appetite has returned to normal after stopping Mirtazapine. She reports that she has been isolating some. Denies SI.   She reports that work remains stressful.   Takes Gabapentin 300 mg po qd prn every few days for anxiety.   She reports that she maintaining sobriety. She reports that she has been attending [address] meetings.   Past Psychiatric Medication Trials: Paxil- Has taken it since 60 yo. Has had times where it has been less effective.Had wt gain and sexual side effects at higher doses (40 mg and 60 mg). Prozac- felt more anxious Sertraline Cymbalta- Started on 40 mg and ws increased to 60 mg. Wellbutrin- increased irritability Trazodone-helpful but causes some excessive somnolence, especially at 100 mg dose.  Remeron- Effective, wt gain Hydroxyzine Strattera- Effective but feels "wired" Gabapentin- Takes as needed for anxiety and leg cramps.Occ word finding errors with more than 300 mg Buspar-Increased agitation and anxiety Concerta- Had increased anxiety Dayvigo-effective  Review of Systems:  Review of Systems  Gastrointestinal: Negative.   Musculoskeletal: Negative for gait problem.  Neurological: Negative for headaches.       Has not noticed neuropathy in her feet  Psychiatric/Behavioral:       Please refer to HPI    Medications: I have reviewed the patient's current medications.  Current Outpatient Medications  Medication Sig Dispense Refill  . cetirizine (ZYRTEC) 10 MG tablet Take  10 mg by mouth daily.    . Cyanocobalamin (VITAMIN B 12 PO) Take by mouth.    . DULoxetine (CYMBALTA) 30 MG capsule Take 1 capsule (30 mg total) by mouth daily. Take with a 60 mg capsule to equal total daily dose of 90 mg. 30 capsule 1  . estradiol-norethindrone (COMBIPATCH) 0.05-0.14 MG/DAY Place 1 patch  onto the skin once a week.    . Ferrous Sulfate (IRON PO) Take by mouth.    . gabapentin (NEURONTIN) 300 MG capsule Take 1 capsule (300 mg total) by mouth 3 (three) times daily. (Patient taking differently: Take 300 mg by mouth as needed.) 90 capsule 0  . [START ON 05/24/2021] methylphenidate 18 MG PO CR tablet Take 1 tablet (18 mg total) by mouth daily. 30 tablet 0  . Multiple Vitamin (MULTIVITAMIN) tablet Take 1 tablet by mouth daily.    Marland Kitchen ofloxacin (OCUFLOX) 0.3 % ophthalmic solution INSTILL 1 DROP INTO THE AFFECTED EYE(S) FOUR TIMES DAILY FOR 7 DAYS 5 mL 0  . polyvinyl alcohol (LIQUIFILM TEARS) 1.4 % ophthalmic solution Place 1 drop into both eyes as needed for dry eyes.    Marland Kitchen VITAMIN D PO Take 1 tablet by mouth daily.    Marland Kitchen antiseptic oral rinse (BIOTENE) LIQD 15 mLs by Mouth Rinse route as needed for dry mouth. 237 mL 0  . DULoxetine (CYMBALTA) 60 MG capsule Take 1 capsule (60 mg total) by mouth daily. Take with a 30 mg capsule to equal total daily dose of 90 mg. 30 capsule 1  . [START ON 05/17/2021] Lemborexant (DAYVIGO) 5 MG TABS Take 5 mg by mouth at bedtime. 30 tablet 2  . methylphenidate 18 MG PO CR tablet Take 1 tablet (18 mg total) by mouth daily. 30 tablet 0   No current facility-administered medications for this visit.    Medication Side Effects: Other: Mild fatigue upon awakening  Allergies:  Allergies  Allergen Reactions  . Latex Shortness Of Breath, Swelling and Rash    Past Medical History:  Diagnosis Date  . Anxiety   . Depression   . PONV (postoperative nausea and vomiting)     Family History  Problem Relation Age of Onset  . Anxiety disorder Sister   . Depression Brother   . Schizophrenia Brother   . Suicidality Brother   . Anxiety disorder Brother   . Alcohol abuse Sister   . Alcohol abuse Brother   . Alcohol abuse Brother     Social History   Socioeconomic History  . Marital status: Divorced    Spouse name: Not on file  . Number of children: Not on  file  . Years of education: Not on file  . Highest education level: Not on file  Occupational History  . Not on file  Tobacco Use  . Smoking status: Never Smoker  . Smokeless tobacco: Never Used  Vaping Use  . Vaping Use: Never used  Substance and Sexual Activity  . 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 Drug use: No  . Sexual activity: Not Currently  Other Topics Concern  . Not on file  Social History Narrative  . Not on file   Social Determinants of Health   Financial Resource Strain: Not on file  Food Insecurity: Not on file  Transportation Needs: Not on file  Physical Activity: Not on file  Stress: Not on file  Social Connections: Not on file  Intimate Partner Violence: Not  on file    Past Medical History, Surgical history, Social history, and Family history were reviewed and updated as appropriate.   Please see review of systems for further details on the patient's review from today.   Objective:   Physical Exam:  There were no vitals taken for this visit.  Physical Exam Neurological:     Mental Status: She is alert and oriented to person, place, and time.     Cranial Nerves: No dysarthria.  Psychiatric:        Attention and Perception: Attention and perception normal.        Speech: Speech normal.        Behavior: Behavior is cooperative.        Thought Content: Thought content normal. Thought content is not paranoid or delusional. Thought content does not include homicidal or suicidal ideation. Thought content does not include homicidal or suicidal plan.        Cognition and Memory: Cognition and memory normal.        Judgment: Judgment normal.     Comments: Insight intact Mood presents as less anxious and less depressed     Lab Review:     Component Value Date/Time   NA 138 01/23/2021 0632   K 4.2 01/23/2021 0632   CL 101 01/23/2021 0632   CO2 30 01/23/2021 0632   GLUCOSE 91 01/23/2021 0632   BUN 14  01/23/2021 0632   CREATININE 0.70 01/23/2021 0632   CALCIUM 9.2 01/23/2021 0632   PROT 6.8 01/22/2021 0648   ALBUMIN 4.1 01/22/2021 0648   AST 40 01/22/2021 0648   ALT 30 01/22/2021 0648   ALKPHOS 42 01/22/2021 0648   BILITOT 0.9 01/22/2021 0648   GFRNONAA >60 01/23/2021 0632   GFRAA >60 05/14/2019 0317       Component Value Date/Time   WBC 3.9 (L) 01/22/2021 0648   RBC 4.44 01/22/2021 0648   HGB 13.8 01/22/2021 0648   HCT 41.7 01/22/2021 0648   PLT 198 01/22/2021 0648   MCV 93.9 01/22/2021 0648   MCH 31.1 01/22/2021 0648   MCHC 33.1 01/22/2021 0648   RDW 12.7 01/22/2021 0648   LYMPHSABS 1.4 01/22/2021 0648   MONOABS 0.4 01/22/2021 0648   EOSABS 0.2 01/22/2021 0648   BASOSABS 0.0 01/22/2021 0648    No results found for: POCLITH, LITHIUM   No results found for: PHENYTOIN, PHENOBARB, VALPROATE, CBMZ   .res Assessment: Plan:    Patient seen for 30 minutes and time spent counseling the patient regarding potential benefits, risks, and side effects of increasing Cymbalta to 90 mg daily.  Discussed that this is an off label dose and that Cymbalta has not been approved for doses higher than 60 mg daily.  Patient agrees to trial in increase in Cymbalta. Will increase Cymbalta to 90 mg daily to further improve mood and anxiety signs and symptoms since she has had a partial response without side effects at 60 mg daily. Will restart Concerta 18 mg daily to improve concentration and focus. Continue Dayvigo 5 mg daily for insomnia. Continue gabapentin 300 mg as needed for anxiety. Patient to follow-up in approximately 6 weeks or sooner if clinically indicated. Patient advised to contact office with any questions, adverse effects, or acute worsening in signs and symptoms.    Farris was seen today for depression, anxiety and insomnia.  Diagnoses and all orders for this visit:  Attention deficit hyperactivity disorder (ADHD), combined type -     methylphenidate 18 MG PO CR tablet;  Take 1 tablet (18 mg total) by mouth daily. -     methylphenidate 18 MG PO CR tablet; Take 1 tablet (18 mg total) by mouth daily.  Generalized anxiety disorder -     DULoxetine (CYMBALTA) 60 MG capsule; Take 1 capsule (60 mg total) by mouth daily. Take with a 30 mg capsule to equal total daily dose of 90 mg. -     DULoxetine (CYMBALTA) 30 MG capsule; Take 1 capsule (30 mg total) by mouth daily. Take with a 60 mg capsule to equal total daily dose of 90 mg.  Depression, unspecified depression type -     DULoxetine (CYMBALTA) 60 MG capsule; Take 1 capsule (60 mg total) by mouth daily. Take with a 30 mg capsule to equal total daily dose of 90 mg. -     DULoxetine (CYMBALTA) 30 MG capsule; Take 1 capsule (30 mg total) by mouth daily. Take with a 60 mg capsule to equal total daily dose of 90 mg.  Insomnia, unspecified type -     Lemborexant (DAYVIGO) 5 MG TABS; Take 5 mg by mouth at bedtime.    Please see After Visit Summary for patient specific instructions.  Future Appointments  Date Time Provider [place]  06/03/2021  8:20 AM [facility]    No orders of the defined types were placed in this encounter.     -------------------------------

## 2021-05-04 DIAGNOSIS — M62838 Other muscle spasm: Secondary | ICD-10-CM | POA: Diagnosis not present

## 2021-05-04 DIAGNOSIS — N3941 Urge incontinence: Secondary | ICD-10-CM | POA: Diagnosis not present

## 2021-05-04 DIAGNOSIS — N9412 Deep dyspareunia: Secondary | ICD-10-CM | POA: Diagnosis not present

## 2021-05-04 DIAGNOSIS — N393 Stress incontinence (female) (male): Secondary | ICD-10-CM | POA: Diagnosis not present

## 2021-05-20 DIAGNOSIS — L905 Scar conditions and fibrosis of skin: Secondary | ICD-10-CM | POA: Diagnosis not present

## 2021-05-20 DIAGNOSIS — D1801 Hemangioma of skin and subcutaneous tissue: Secondary | ICD-10-CM | POA: Diagnosis not present

## 2021-05-20 DIAGNOSIS — L821 Other seborrheic keratosis: Secondary | ICD-10-CM | POA: Diagnosis not present

## 2021-05-20 DIAGNOSIS — L570 Actinic keratosis: Secondary | ICD-10-CM | POA: Diagnosis not present

## 2021-06-03 ENCOUNTER — Ambulatory Visit
Admission: RE | Admit: 2021-06-03 | Discharge: 2021-06-03 | Disposition: A | Discharge status: Home / Self Care | Payer: BC Managed Care – PPO | Source: Ambulatory Visit | Attending: Family Medicine | Admitting: Family Medicine

## 2021-06-03 ENCOUNTER — Other Ambulatory Visit: Payer: Self-pay

## 2021-06-03 DIAGNOSIS — Z1231 Encounter for screening mammogram for malignant neoplasm of breast: Secondary | ICD-10-CM | POA: Diagnosis not present

## 2021-06-13 DIAGNOSIS — N9412 Deep dyspareunia: Secondary | ICD-10-CM | POA: Diagnosis not present

## 2021-06-13 DIAGNOSIS — M62838 Other muscle spasm: Secondary | ICD-10-CM | POA: Diagnosis not present

## 2021-06-13 DIAGNOSIS — N393 Stress incontinence (female) (male): Secondary | ICD-10-CM | POA: Diagnosis not present

## 2021-06-13 DIAGNOSIS — N3941 Urge incontinence: Secondary | ICD-10-CM | POA: Diagnosis not present

## 2021-06-14 ENCOUNTER — Encounter: Payer: Self-pay | Admitting: Psychiatry

## 2021-06-14 ENCOUNTER — Other Ambulatory Visit: Payer: Self-pay

## 2021-06-14 ENCOUNTER — Ambulatory Visit (INDEPENDENT_AMBULATORY_CARE_PROVIDER_SITE_OTHER): Payer: BC Managed Care – PPO | Admitting: Psychiatry

## 2021-06-14 DIAGNOSIS — F411 Generalized anxiety disorder: Secondary | ICD-10-CM | POA: Diagnosis not present

## 2021-06-14 DIAGNOSIS — F32A Depression, unspecified: Secondary | ICD-10-CM

## 2021-06-14 DIAGNOSIS — G4700 Insomnia, unspecified: Secondary | ICD-10-CM

## 2021-06-14 DIAGNOSIS — F902 Attention-deficit hyperactivity disorder, combined type: Secondary | ICD-10-CM

## 2021-06-14 MED ORDER — DULOXETINE HCL 60 MG PO CPEP: 60.0000 mg | ORAL_CAPSULE | Freq: Every day | ORAL | 1 refills | Status: DC

## 2021-06-14 MED ORDER — METHYLPHENIDATE HCL ER 18 MG PO TB24: 18.0000 mg | ORAL_TABLET | Freq: Every day | ORAL | 0 refills | Status: DC

## 2021-06-14 MED ORDER — METHYLPHENIDATE HCL ER (OSM) 18 MG PO TBCR: 18.0000 mg | EXTENDED_RELEASE_TABLET | Freq: Every day | ORAL | 0 refills | Status: DC

## 2021-06-14 MED ORDER — GABAPENTIN 300 MG PO CAPS: 300.0000 mg | ORAL_CAPSULE | Freq: Three times a day (TID) | ORAL | 0 refills | Status: DC

## 2021-06-14 MED ORDER — DAYVIGO 5 MG PO TABS
5.0000 mg | ORAL_TABLET | Freq: Every evening | ORAL | 2 refills | Status: DC
Start: 2021-08-16 — End: 2021-07-11

## 2021-06-14 MED ORDER — DULOXETINE HCL 30 MG PO CPEP: 30.0000 mg | ORAL_CAPSULE | Freq: Every day | ORAL | 1 refills | Status: DC

## 2021-06-14 NOTE — Progress Notes (Signed)
[name] [id number] 09-28-1961 60 y.o.  Subjective:   Patient ID:  Debra Rhodes is a 60 y.o. (DOB 02/07/1961) female.  Chief Complaint:  Chief Complaint  Patient presents with   Follow-up    Anxiety, depression, ADHD, and insomnia    HPI Debra Rhodes presents to the office today for follow-up of anxiety, depression, ADHD, and insomnia. She reports feeling,"so much better." She reports that she has some continued anxiety "but that's just me... it's manageable." Denies panic. Has some anxiety in response to work and social anxiety. Mood has been "so much better." She notices some mild residua; depression. She reports that her sleep has improved. Appetite has been ok.  Energy and motivation have been "fine." She reports that concentration is improved with Concerta. Denies SI.   She reports that she has been maintaining sobriety. Last ETOH use was 01/18/21. She has been going to meetings, about 4 a week. Denies any acute stressors.   She reports that work stress has improved some.   Past Psychiatric Medication Trials: Paxil- Has taken it since 60 yo. Has had times where it has been less effective. Had wt gain and sexual side effects at higher doses (40 mg and 60 mg). Prozac- felt more anxious Sertraline Cymbalta- Started on 40 mg and ws increased to 60 mg. Wellbutrin- increased irritability Trazodone-helpful but causes some excessive somnolence, especially at 100 mg dose.  Remeron- Effective, wt gain Hydroxyzine Strattera- Effective but feels "wired" Gabapentin- Takes as needed for anxiety and leg cramps. Occ word finding errors with more than 300 mg Buspar-Increased agitation and anxiety Concerta- Had increased anxiety Dayvigo-effective    Flowsheet Row ED from 01/17/2021 in [place] DEPT  C-SSRS RISK CATEGORY High Risk        Review of Systems:  Review of Systems  Cardiovascular:        Reports that she has had 1 or 2 episodes of  palpitations with increased anxiety and taking more gabapentin  Musculoskeletal:  Negative for gait problem.  Neurological:  Negative for tremors.  Psychiatric/Behavioral:         Please refer to HPI   Medications: I have reviewed the patient's current medications.  Current Outpatient Medications  Medication Sig Dispense Refill   cetirizine (ZYRTEC) 10 MG tablet Take 10 mg by mouth daily.     Cyanocobalamin (VITAMIN B 12 PO) Take by mouth.     estradiol-norethindrone (COMBIPATCH) 0.05-0.14 MG/DAY Place 1 patch onto the skin once a week.     Ferrous Sulfate (IRON PO) Take by mouth.     methylphenidate 18 MG PO CR tablet Take 1 tablet (18 mg total) by mouth daily. 30 tablet 0   [START ON 07/12/2021] methylphenidate 18 MG PO CR tablet Take 1 tablet (18 mg total) by mouth daily. 30 tablet 0   [START ON 09/06/2021] methylphenidate 18 MG PO CR tablet Take 1 tablet (18 mg total) by mouth daily. 30 tablet 0   Multiple Vitamin (MULTIVITAMIN) tablet Take 1 tablet by mouth daily.     polyvinyl alcohol (LIQUIFILM TEARS) 1.4 % ophthalmic solution Place 1 drop into both eyes as needed for dry eyes.     VITAMIN D PO Take 1 tablet by mouth daily.     antiseptic oral rinse (BIOTENE) LIQD 15 mLs by Mouth Rinse route as needed for dry mouth. (Patient not taking: Reported on 06/14/2021) 237 mL 0   DULoxetine (CYMBALTA) 30 MG capsule Take 1 capsule (30 mg total) by  mouth daily. Take with a 60 mg capsule to equal total daily dose of 90 mg. 90 capsule 1   DULoxetine (CYMBALTA) 60 MG capsule Take 1 capsule (60 mg total) by mouth daily. Take with a 30 mg capsule to equal total daily dose of 90 mg. 90 capsule 1   gabapentin (NEURONTIN) 300 MG capsule Take 1 capsule (300 mg total) by mouth 3 (three) times daily. 90 capsule 0   [START ON 08/16/2021] Lemborexant (DAYVIGO) 5 MG TABS Take 5 mg by mouth at bedtime. 30 tablet 2   [START ON 08/09/2021] methylphenidate 18 MG PO CR tablet Take 1 tablet (18 mg total) by mouth  daily. 30 tablet 0   No current facility-administered medications for this visit.    Medication Side Effects: Other: Tiredness when Concerta wears off.   Allergies:  Allergies  Allergen Reactions   Latex Shortness Of Breath, Swelling and Rash    Past Medical History:  Diagnosis Date   Anxiety    Depression    PONV (postoperative nausea and vomiting)     Past Medical History, Surgical history, Social history, and Family history were reviewed and updated as appropriate.   Please see review of systems for further details on the patient's review from today.   Objective:   Physical Exam:  BP 90/64   Pulse 72   Physical Exam Constitutional:      General: She is not in acute distress. Musculoskeletal:        General: No deformity.  Neurological:     Mental Status: She is alert and oriented to person, place, and time.     Coordination: Coordination normal.  Psychiatric:        Attention and Perception: Attention and perception normal. She does not perceive auditory or visual hallucinations.        Mood and Affect: Mood normal. Mood is not anxious or depressed. Affect is not labile, blunt, angry or inappropriate.        Speech: Speech normal.        Behavior: Behavior normal.        Thought Content: Thought content normal. Thought content is not paranoid or delusional. Thought content does not include homicidal or suicidal ideation. Thought content does not include homicidal or suicidal plan.        Cognition and Memory: Cognition and memory normal.        Judgment: Judgment normal.     Comments: Insight intact    Lab Review:     Component Value Date/Time   NA 138 01/23/2021 0632   K 4.2 01/23/2021 0632   CL 101 01/23/2021 0632   CO2 30 01/23/2021 0632   GLUCOSE 91 01/23/2021 0632   BUN 14 01/23/2021 0632   CREATININE 0.70 01/23/2021 0632   CALCIUM 9.2 01/23/2021 0632   PROT 6.8 01/22/2021 0648   ALBUMIN 4.1 01/22/2021 0648   AST 40 01/22/2021 0648   ALT 30  01/22/2021 0648   ALKPHOS 42 01/22/2021 0648   BILITOT 0.9 01/22/2021 0648   GFRNONAA >60 01/23/2021 0632   GFRAA >60 05/14/2019 0317       Component Value Date/Time   WBC 3.9 (L) 01/22/2021 0648   RBC 4.44 01/22/2021 0648   HGB 13.8 01/22/2021 0648   HCT 41.7 01/22/2021 0648   PLT 198 01/22/2021 0648   MCV 93.9 01/22/2021 0648   MCH 31.1 01/22/2021 0648   MCHC 33.1 01/22/2021 0648   RDW 12.7 01/22/2021 0648   LYMPHSABS 1.4 01/22/2021 [id number]  MONOABS 0.4 01/22/2021 0648   EOSABS 0.2 01/22/2021 0648   BASOSABS 0.0 01/22/2021 0648    No results found for: POCLITH, LITHIUM   No results found for: PHENYTOIN, PHENOBARB, VALPROATE, CBMZ   .res Assessment: Plan:   Will continue current plan of care since target signs and symptoms are well controlled without any tolerability issues. Continue Cymbalta 90 mg po qd for depression and anxiety.  Continue Concerta 18 mg po q am for ADHD.  Continue Gabapentin 300 mg po TID for anxiety. Continue Dayvigo 5 mg po QHS for insomnia.  Pt to follow-up in 3 months or sooner if clinically indicated.  Patient advised to contact office with any questions, adverse effects, or acute worsening in signs and symptoms.   Veena was seen today for follow-up.  Diagnoses and all orders for this visit:  Generalized anxiety disorder -     DULoxetine (CYMBALTA) 30 MG capsule; Take 1 capsule (30 mg total) by mouth daily. Take with a 60 mg capsule to equal total daily dose of 90 mg. -     DULoxetine (CYMBALTA) 60 MG capsule; Take 1 capsule (60 mg total) by mouth daily. Take with a 30 mg capsule to equal total daily dose of 90 mg. -     gabapentin (NEURONTIN) 300 MG capsule; Take 1 capsule (300 mg total) by mouth 3 (three) times daily.  Depression, unspecified depression type -     DULoxetine (CYMBALTA) 30 MG capsule; Take 1 capsule (30 mg total) by mouth daily. Take with a 60 mg capsule to equal total daily dose of 90 mg. -     DULoxetine (CYMBALTA) 60 MG  capsule; Take 1 capsule (60 mg total) by mouth daily. Take with a 30 mg capsule to equal total daily dose of 90 mg.  Insomnia, unspecified type -     Lemborexant (DAYVIGO) 5 MG TABS; Take 5 mg by mouth at bedtime.  Attention deficit hyperactivity disorder (ADHD), combined type -     methylphenidate 18 MG PO CR tablet; Take 1 tablet (18 mg total) by mouth daily. -     methylphenidate 18 MG PO CR tablet; Take 1 tablet (18 mg total) by mouth daily. -     methylphenidate 18 MG PO CR tablet; Take 1 tablet (18 mg total) by mouth daily.    Please see After Visit Summary for patient specific instructions.  Future Appointments  Date Time Provider [place]  09/14/2021  8:30 AM Thayer Headings, PMHNP [facility] None    No orders of the defined types were placed in this encounter.   -------------------------------

## 2021-06-18 ENCOUNTER — Other Ambulatory Visit (HOSPITAL_COMMUNITY): Payer: Self-pay

## 2021-06-22 ENCOUNTER — Telehealth: Payer: Self-pay

## 2021-06-22 NOTE — Telephone Encounter (Signed)
Prior Authorization submitted for DAYVIGO 5 MG to [facility], after review they DENIED request. They require she try and fail (2) formularies which are: Eszopiclone, Zaleplon, and Zolpidem.   Will update Janett Billow with information then follow up with pt.

## 2021-06-22 NOTE — Telephone Encounter (Signed)
Yes I can send an appeal.

## 2021-06-23 NOTE — Telephone Encounter (Signed)
Was able to request another review with more information. Will wait for response before sending appeal.

## 2021-07-07 ENCOUNTER — Encounter (HOSPITAL_COMMUNITY): Payer: Self-pay

## 2021-07-07 ENCOUNTER — Emergency Department (HOSPITAL_COMMUNITY)
Admission: EM | Admit: 2021-07-07 | Discharge: 2021-07-08 | Disposition: A | Discharge status: Other Acute Inpatient Hospital | Payer: BC Managed Care – PPO | Source: Home / Self Care | Attending: Emergency Medicine | Admitting: Emergency Medicine

## 2021-07-07 ENCOUNTER — Other Ambulatory Visit: Payer: Self-pay

## 2021-07-07 DIAGNOSIS — R45851 Suicidal ideations: Secondary | ICD-10-CM | POA: Insufficient documentation

## 2021-07-07 DIAGNOSIS — Z20822 Contact with and (suspected) exposure to covid-19: Secondary | ICD-10-CM | POA: Insufficient documentation

## 2021-07-07 DIAGNOSIS — Y906 Blood alcohol level of 120-199 mg/100 ml: Secondary | ICD-10-CM | POA: Insufficient documentation

## 2021-07-07 DIAGNOSIS — W5503XA Scratched by cat, initial encounter: Secondary | ICD-10-CM | POA: Insufficient documentation

## 2021-07-07 DIAGNOSIS — Z91040 Latex allergy status: Secondary | ICD-10-CM | POA: Insufficient documentation

## 2021-07-07 DIAGNOSIS — S71101A Unspecified open wound, right thigh, initial encounter: Secondary | ICD-10-CM | POA: Insufficient documentation

## 2021-07-07 DIAGNOSIS — F10129 Alcohol abuse with intoxication, unspecified: Secondary | ICD-10-CM | POA: Insufficient documentation

## 2021-07-07 DIAGNOSIS — Z96651 Presence of right artificial knee joint: Secondary | ICD-10-CM | POA: Insufficient documentation

## 2021-07-07 DIAGNOSIS — F10920 Alcohol use, unspecified with intoxication, uncomplicated: Secondary | ICD-10-CM

## 2021-07-07 DIAGNOSIS — F332 Major depressive disorder, recurrent severe without psychotic features: Secondary | ICD-10-CM | POA: Insufficient documentation

## 2021-07-07 DIAGNOSIS — F32A Depression, unspecified: Secondary | ICD-10-CM | POA: Insufficient documentation

## 2021-07-07 DIAGNOSIS — F10120 Alcohol abuse with intoxication, uncomplicated: Secondary | ICD-10-CM | POA: Diagnosis not present

## 2021-07-07 DIAGNOSIS — R002 Palpitations: Secondary | ICD-10-CM | POA: Diagnosis not present

## 2021-07-07 LAB — BASIC METABOLIC PANEL
Anion gap: 10 (ref 5–15)
BUN: 7 mg/dL (ref 6–20)
CO2: 26 mmol/L (ref 22–32)
Calcium: 9.3 mg/dL (ref 8.9–10.3)
Chloride: 101 mmol/L (ref 98–111)
Creatinine, Ser: 0.64 mg/dL (ref 0.44–1.00)
GFR, Estimated: >60 mL/min (ref >60)
Glucose, Bld: 90 mg/dL (ref 70–99)
Potassium: 4.2 mmol/L (ref 3.5–5.1)
Sodium: 137 mmol/L (ref 135–145)

## 2021-07-07 LAB — CBC WITH DIFFERENTIAL/PLATELET
Abs Immature Granulocytes: 0.02 10*3/uL (ref 0.00–0.07)
Basophils Absolute: 0 10*3/uL (ref 0.0–0.1)
Basophils Relative: 1 %
Eosinophils Absolute: 0 10*3/uL (ref 0.0–0.5)
Eosinophils Relative: 1 %
HCT: 42.1 % (ref 36.0–46.0)
Hemoglobin: 14.2 g/dL (ref 12.0–15.0)
Immature Granulocytes: 0 %
Lymphocytes Relative: 30 %
Lymphs Abs: 1.5 10*3/uL (ref 0.7–4.0)
MCH: 31.2 pg (ref 26.0–34.0)
MCHC: 33.7 g/dL (ref 30.0–36.0)
MCV: 92.5 fL (ref 80.0–100.0)
Monocytes Absolute: 0.2 10*3/uL (ref 0.1–1.0)
Monocytes Relative: 5 %
Neutro Abs: 3.2 10*3/uL (ref 1.7–7.7)
Neutrophils Relative %: 63 %
Platelets: 286 10*3/uL (ref 150–400)
RBC: 4.55 MIL/uL (ref 3.87–5.11)
RDW: 13 % (ref 11.5–15.5)
WBC: 5 10*3/uL (ref 4.0–10.5)
nRBC: 0 % (ref 0.0–0.2)

## 2021-07-07 LAB — URINALYSIS, ROUTINE W REFLEX MICROSCOPIC
Bilirubin Urine: NEGATIVE
Glucose, UA: NEGATIVE mg/dL
Ketones, ur: NEGATIVE mg/dL
Leukocytes,Ua: NEGATIVE
Nitrite: NEGATIVE
Protein, ur: NEGATIVE mg/dL
Specific Gravity, Urine: 1.009 (ref 1.005–1.030)
pH: 7 (ref 5.0–8.0)

## 2021-07-07 LAB — RESP PANEL BY RT-PCR (FLU A&B, COVID) ARPGX2
Influenza A by PCR: NEGATIVE
Influenza B by PCR: NEGATIVE
SARS Coronavirus 2 by RT PCR: NEGATIVE

## 2021-07-07 LAB — ETHANOL: Alcohol, Ethyl (B): 199 mg/dL — ABNORMAL HIGH (ref <10)

## 2021-07-07 LAB — RAPID URINE DRUG SCREEN, HOSP PERFORMED
Amphetamines: NOT DETECTED
Barbiturates: NOT DETECTED
Benzodiazepines: NOT DETECTED
Cocaine: NOT DETECTED
Opiates: NOT DETECTED
Tetrahydrocannabinol: NOT DETECTED

## 2021-07-07 MED ORDER — THIAMINE HCL 100 MG PO TABS
100.0000 mg | ORAL_TABLET | Freq: Every day | ORAL | Status: DC
Start: 2021-07-07 — End: 2021-07-08
  Administered 2021-07-07 – 2021-07-08 (×2): 100 mg via ORAL
  Filled 2021-07-07 (×2): qty 1

## 2021-07-07 MED ORDER — FOLIC ACID 1 MG PO TABS
1.0000 mg | ORAL_TABLET | Freq: Once | ORAL | Status: AC
  Administered 2021-07-07: 1 mg via ORAL
  Filled 2021-07-07: qty 1

## 2021-07-07 MED ORDER — LORAZEPAM 1 MG PO TABS
0.0000 mg | ORAL_TABLET | Freq: Two times a day (BID) | ORAL | Status: DC
  Administered 2021-07-08: 1 mg via ORAL

## 2021-07-07 MED ORDER — ONDANSETRON 4 MG PO TBDP
4.0000 mg | ORAL_TABLET | Freq: Once | ORAL | Status: AC
  Administered 2021-07-07: 4 mg via ORAL
  Filled 2021-07-07: qty 1

## 2021-07-07 MED ORDER — LORAZEPAM 2 MG/ML IJ SOLN: 0.0000 mg | Freq: Two times a day (BID) | INTRAMUSCULAR | Status: DC

## 2021-07-07 MED ORDER — LORAZEPAM 2 MG/ML IJ SOLN: 0.0000 mg | Freq: Four times a day (QID) | INTRAMUSCULAR | Status: DC

## 2021-07-07 MED ORDER — SODIUM CHLORIDE 0.9 % IV SOLN
1.0000 mg | Freq: Once | INTRAVENOUS | Status: DC
  Filled 2021-07-07: qty 0.2

## 2021-07-07 MED ORDER — LORAZEPAM 1 MG PO TABS
2.0000 mg | ORAL_TABLET | Freq: Once | ORAL | Status: AC
  Administered 2021-07-07: 2 mg via ORAL
  Filled 2021-07-07: qty 2

## 2021-07-07 MED ORDER — THIAMINE HCL 100 MG/ML IJ SOLN: 100.0000 mg | Freq: Every day | INTRAMUSCULAR | Status: DC

## 2021-07-07 MED ORDER — LORAZEPAM 1 MG PO TABS
0.0000 mg | ORAL_TABLET | Freq: Four times a day (QID) | ORAL | Status: DC
  Administered 2021-07-07: 1 mg via ORAL
  Filled 2021-07-07 (×2): qty 1

## 2021-07-07 NOTE — ED Notes (Signed)
Pt's daughter would like a call with an update when we have it

## 2021-07-07 NOTE — ED Triage Notes (Signed)
Pt presents to ED, endorses SI without a plan and ETOH relapse. States feelings started a few days ago with no known trigger. Been sober for 5 months, relapsed 2 days ago, 2 bottles of wine/day each day except 1/2 bottle today. 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 Very tearful during triage. Two bandaged scratches notes to right thigh, pt states from a cat.
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
## 2021-07-07 NOTE — ED Provider Notes (Signed)
[facility] DEPT Provider Note   CSN: [id number] Arrival date & time: 07/07/21  1440     History Chief Complaint  Patient presents with   Suicidal   Alcohol Problem    Debra Rhodes is a very teary 60 y.o. female with a pmh of MDD, alcohol use disorder and ADHD presenting with the complaint of SI. Patient states that she is no longer suicidal but does not feel like she is stronger to live."  No self-harm.  Reports that she relapsed from her alcohol sobriety 2 days ago.  Reports having previously sober for 4 years, however recent relapse note from 01/2021.  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  Reports that this morning after she drank 1 bottle of wine she started to feel suicidal, no plan, no access to firearms.  She spoke with her 2 adult daughters and they asked her to come to the emergency department.  Currently only complains that her heart is racing and that she is very sad. Reports that she only sometimes takes her Cymbalta, but took it this AM. Takes Gabapentin for her anxiety 1-3 times a day. Takes Dayvigo at night time. Compliant with all medications but her Cymbalta. Denies drug use.   Says that she continues to try to go to AA, but she is not sure it is helping.   Past Medical History:  Diagnosis Date   Anxiety    Depression    PONV (postoperative nausea and vomiting)     Patient Active Problem List   Diagnosis Date Noted   Alcohol abuse 01/21/2021   MDD (major depressive disorder), recurrent severe, without psychosis ([place]) 01/21/2021   Overweight (BMI 25.0-29.9) 05/14/2019   S/P right TKA 05/13/2019   Synovial cyst of knee 02/10/2019   Pain in right knee 02/04/2019    Past Surgical History:  Procedure Laterality Date   AUGMENTATION MAMMAPLASTY Bilateral 1996   [place]     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   Procedure: TOTAL KNEE ARTHROPLASTY;   Surgeon: Paralee Cancel, MD;  Location: [facility] ORS;  Service: Orthopedics;  Laterality: Right;  70 mins     OB History   No obstetric history on file.     Family History  Problem Relation Age of Onset   Anxiety disorder Sister    Depression Brother    Schizophrenia Brother    Suicidality Brother    Anxiety disorder Brother    Alcohol abuse Sister    Alcohol abuse Brother    Alcohol abuse Brother     Social History   Tobacco Use   Smoking status: Never   Smokeless tobacco: Never  Vaping Use   Vaping Use: Never used  Substance Use Topics   Alcohol use: Yes    Comment: binge drinking- was in recovery for past 4 years   Drug use: No    Home Medications Prior to Admission medications   Medication Sig Start Date End Date Taking? Authorizing Provider  antiseptic oral rinse (BIOTENE) LIQD 15 mLs by Mouth Rinse route as needed for dry mouth. Patient not taking: Reported on 06/14/2021 01/24/21   Ethelene Hal, NP  cetirizine (ZYRTEC) 10 MG tablet Take 10 mg by mouth daily.    [provider]  Cyanocobalamin (VITAMIN B 12 PO) Take by mouth.    [provider]  DULoxetine (CYMBALTA) 30 MG capsule Take 1 capsule (  30 mg total) by mouth daily. Take with a 60 mg capsule to equal total daily dose of 90 mg. 06/14/21   Thayer Headings, PMHNP  DULoxetine (CYMBALTA) 60 MG capsule Take 1 capsule (60 mg total) by mouth daily. Take with a 30 mg capsule to equal total daily dose of 90 mg. 06/14/21   Thayer Headings, PMHNP  estradiol-norethindrone [facility]) 0.05-0.14 MG/DAY Place 1 patch onto the skin once a week.    [provider]  Ferrous Sulfate (IRON PO) Take by mouth.    [provider]  gabapentin (NEURONTIN) 300 MG capsule Take 1 capsule (300 mg total) by mouth 3 (three) times daily. 06/14/21   Thayer Headings, PMHNP  Lemborexant (DAYVIGO) 5 MG TABS Take 5 mg by mouth at bedtime. 08/16/21   Thayer Headings, PMHNP  methylphenidate 18 MG PO CR tablet Take  1 tablet (18 mg total) by mouth daily. 05/24/21   Thayer Headings, PMHNP  methylphenidate 18 MG PO CR tablet Take 1 tablet (18 mg total) by mouth daily. 08/09/21   Thayer Headings, PMHNP  methylphenidate 18 MG PO CR tablet Take 1 tablet (18 mg total) by mouth daily. 07/12/21   Thayer Headings, PMHNP  methylphenidate 18 MG PO CR tablet Take 1 tablet (18 mg total) by mouth daily. 09/06/21   Thayer Headings, PMHNP  Multiple Vitamin (MULTIVITAMIN) tablet Take 1 tablet by mouth daily.    [provider]  polyvinyl alcohol (LIQUIFILM TEARS) 1.4 % ophthalmic solution Place 1 drop into both eyes as needed for dry eyes.    [provider]  VITAMIN D PO Take 1 tablet by mouth daily.    [provider]  sertraline (ZOLOFT) 100 MG tablet Decrease to 1.5 tabs daily for 5 days, then 1 tab daily for 5 days, then 1/2 tablet daily for 5 days, then stop 11/23/20 12/23/20  Thayer Headings, PMHNP    Allergies    Latex  Review of Systems   Review of Systems  Constitutional:  Negative for chills, fatigue and fever.  Respiratory:  Negative for shortness of breath.   Cardiovascular:  Positive for palpitations. Negative for chest pain.  Gastrointestinal:  Positive for nausea. Negative for abdominal pain and vomiting.  Skin:  Positive for wound (Right anterior thigh from cat scratch.). Negative for color change.  Neurological:  Positive for dizziness and light-headedness. Negative for syncope and speech difficulty.  Psychiatric/Behavioral:  Positive for behavioral problems. Negative for agitation, confusion, hallucinations, self-injury, sleep disturbance and suicidal ideas. The patient is nervous/anxious.   All other systems reviewed and are negative.  Physical Exam Updated Vital Signs BP (!) 162/92 (BP Location: Left Arm)   Pulse (!) 111   Temp 98.7 F (37.1 C) (Oral)   Resp (!) 22   Ht '5\' 3"'$  (1.6 m)   Wt 59 kg   SpO2 99%   BMI 23.03 kg/m   Physical Exam Vitals and nursing note  reviewed.  Constitutional:      Appearance: Normal appearance.     Comments: Very teary at bedside  HENT:     Head: Normocephalic and atraumatic.  Eyes:     General: No scleral icterus.    Extraocular Movements: Extraocular movements intact.     Conjunctiva/sclera: Conjunctivae normal.  Cardiovascular:     Rate and Rhythm: Normal rate and regular rhythm.  Pulmonary:     Effort: Pulmonary effort is normal. No respiratory distress.  Abdominal:     General: Abdomen is flat. There is no distension.  Palpations: Abdomen is soft.     Tenderness: There is no abdominal tenderness. There is no guarding.  Musculoskeletal:        General: No signs of injury.  Skin:    General: Skin is warm and dry.     Findings: Lesion (6cm vertical superficial wound to the anterior right thigh) present. No rash.  Neurological:     Mental Status: She is alert and oriented to person, place, and time.  Psychiatric:        Mood and Affect: Mood normal.    ED Results / Procedures / Treatments   Labs (all labs ordered are listed, but only abnormal results are displayed) Labs Reviewed  URINALYSIS, ROUTINE W REFLEX MICROSCOPIC - Abnormal; Notable for the following components:      Result Value   APPearance HAZY (*)    Hgb urine dipstick MODERATE (*)    Bacteria, UA RARE (*)    All other components within normal limits  ETHANOL - Abnormal; Notable for the following components:   Alcohol, Ethyl (B) 199 (*)    All other components within normal limits  RESP PANEL BY RT-PCR (FLU A&B, COVID) ARPGX2  RAPID URINE DRUG SCREEN, HOSP PERFORMED  CBC WITH DIFFERENTIAL/PLATELET  BASIC METABOLIC PANEL  ETHANOL    EKG None  Radiology No results found.  Procedures Procedures   Medications Ordered in ED Medications - No data to display  ED Course  I have reviewed the triage vital signs and the nursing notes.  Pertinent labs & imaging results that were available during my care of the patient were  reviewed by me and considered in my medical decision making (see chart for details).  Patient was teary but oriented during evaluation. Requesting for her daughter's to come to the room, however I could not locate them in the waiting room. Call placed to daughter x2. Patient notified and says she understands and is feeling calmer.   Clinical Course as of 07/07/21 2128  Thu Jul 07, 2021  2116 Spoke with patient's daughter onsite who corroborated patient's history.  [MR]    Clinical Course User Index [MR] Madelyn Tlatelpa A, PA-C   I spoke with the patient's daughter Debra Rhodes) who reports that the patient has multiple times becomes suicidal when drinking too much alcohol.  She says that she "does this" when she goes off of her medication and continues to drink heavily.  She says that this morning the patient a message of her ex sponsor saying that she was going to go buy alcohol so that she would not be alive.  The ex sponsor contacted the daughter's who then told their mother that they must take her to the hospital.  Daughter Debra Rhodes) says that she fears that if her mother is evaluated tomorrow without them that she will lie in order to not be admitted.  She states that her mother often says she is afraid put away in an institution.  Alex brought back to say goodnight to her mother who with sleeping quietly.  I told her that I would not be able to estimate the length of her mother's stay.  She voiced understanding.   MDM Rules/Calculators/A&P                         Upon my initial evaluation patient was alert and oriented, however very teary.  She reported that she only no longer has suicidal she stated that she was very disappointed in herself because  she is having so much trouble staying sober.  She expressed concern surrounding how much stress she is placing on her daughters.  She currently lives alone and thinks that her relapse was triggered by excessive stress at work.   When I asked the patient  what she thought she needed today she repeatedly said "I do not know what to do, I just need help."  She was most concerned with maintaining her sobriety.   She was tachycardic on arrival however heart rate dropped to normal Ativan dose.  Stated feeling nauseous and lightheaded which I believe to be due to her acute alcohol use. Given Zofran. TTS consult placed and patient will be under normal psych hold.   Final Clinical Impression(s) / ED Diagnoses Final diagnoses:  Suicidal ideations  Alcoholic intoxication without complication ([place])    Rx / DC Orders Results and diagnoses were explained to the patient. Return precautions discussed in full. Patient had no additional questions and expressed complete understanding.     Rhae Hammock, PA-C 07/08/21 1508    Jeanell Sparrow, DO 07/08/21 2243

## 2021-07-08 ENCOUNTER — Encounter (HOSPITAL_COMMUNITY): Payer: Self-pay | Admitting: Emergency Medicine

## 2021-07-08 ENCOUNTER — Other Ambulatory Visit: Payer: Self-pay | Admitting: Psychiatry

## 2021-07-08 ENCOUNTER — Inpatient Hospital Stay (HOSPITAL_COMMUNITY)
Admission: AD | Admit: 2021-07-08 | Discharge: 2021-07-11 | DRG: 885 | Disposition: A | Discharge status: Home / Self Care | Payer: BC Managed Care – PPO | Source: Intra-hospital | Attending: Emergency Medicine | Admitting: Emergency Medicine

## 2021-07-08 DIAGNOSIS — R45851 Suicidal ideations: Secondary | ICD-10-CM | POA: Diagnosis not present

## 2021-07-08 DIAGNOSIS — G4700 Insomnia, unspecified: Secondary | ICD-10-CM | POA: Diagnosis not present

## 2021-07-08 DIAGNOSIS — Z20822 Contact with and (suspected) exposure to covid-19: Secondary | ICD-10-CM | POA: Diagnosis present

## 2021-07-08 DIAGNOSIS — Z9151 Personal history of suicidal behavior: Secondary | ICD-10-CM | POA: Diagnosis not present

## 2021-07-08 DIAGNOSIS — Y906 Blood alcohol level of 120-199 mg/100 ml: Secondary | ICD-10-CM | POA: Diagnosis present

## 2021-07-08 DIAGNOSIS — Z79899 Other long term (current) drug therapy: Secondary | ICD-10-CM | POA: Diagnosis not present

## 2021-07-08 DIAGNOSIS — E569 Vitamin deficiency, unspecified: Secondary | ICD-10-CM | POA: Diagnosis present

## 2021-07-08 DIAGNOSIS — F419 Anxiety disorder, unspecified: Secondary | ICD-10-CM | POA: Diagnosis not present

## 2021-07-08 DIAGNOSIS — Z811 Family history of alcohol abuse and dependence: Secondary | ICD-10-CM | POA: Diagnosis not present

## 2021-07-08 DIAGNOSIS — Z91040 Latex allergy status: Secondary | ICD-10-CM

## 2021-07-08 DIAGNOSIS — F909 Attention-deficit hyperactivity disorder, unspecified type: Secondary | ICD-10-CM | POA: Diagnosis not present

## 2021-07-08 DIAGNOSIS — F10239 Alcohol dependence with withdrawal, unspecified: Secondary | ICD-10-CM | POA: Diagnosis present

## 2021-07-08 DIAGNOSIS — Z96651 Presence of right artificial knee joint: Secondary | ICD-10-CM | POA: Diagnosis present

## 2021-07-08 DIAGNOSIS — F332 Major depressive disorder, recurrent severe without psychotic features: Secondary | ICD-10-CM | POA: Diagnosis not present

## 2021-07-08 DIAGNOSIS — F10220 Alcohol dependence with intoxication, uncomplicated: Secondary | ICD-10-CM | POA: Diagnosis not present

## 2021-07-08 DIAGNOSIS — F1010 Alcohol abuse, uncomplicated: Secondary | ICD-10-CM | POA: Diagnosis present

## 2021-07-08 DIAGNOSIS — Z818 Family history of other mental and behavioral disorders: Secondary | ICD-10-CM

## 2021-07-08 DIAGNOSIS — R002 Palpitations: Secondary | ICD-10-CM | POA: Diagnosis not present

## 2021-07-08 DIAGNOSIS — F329 Major depressive disorder, single episode, unspecified: Secondary | ICD-10-CM | POA: Diagnosis present

## 2021-07-08 DIAGNOSIS — F10120 Alcohol abuse with intoxication, uncomplicated: Secondary | ICD-10-CM | POA: Diagnosis not present

## 2021-07-08 LAB — ETHANOL: Alcohol, Ethyl (B): <10 mg/dL (ref <10)

## 2021-07-08 MED ORDER — ENSURE ENLIVE PO LIQD
237.0000 mL | Freq: Two times a day (BID) | ORAL | Status: DC
  Administered 2021-07-08 – 2021-07-11 (×2): 237 mL via ORAL
  Filled 2021-07-08 (×10): qty 237

## 2021-07-08 MED ORDER — LORAZEPAM 1 MG PO TABS
1.0000 mg | ORAL_TABLET | Freq: Four times a day (QID) | ORAL | Status: AC
  Administered 2021-07-08 – 2021-07-09 (×4): 1 mg via ORAL
  Filled 2021-07-08 (×3): qty 1

## 2021-07-08 MED ORDER — ADULT MULTIVITAMIN W/MINERALS CH
1.0000 | ORAL_TABLET | Freq: Every day | ORAL | Status: DC
  Administered 2021-07-08 – 2021-07-11 (×4): 1 via ORAL
  Filled 2021-07-08 (×8): qty 1

## 2021-07-08 MED ORDER — LORAZEPAM 1 MG PO TABS: 1.0000 mg | ORAL_TABLET | Freq: Every day | ORAL | Status: DC

## 2021-07-08 MED ORDER — HYDROXYZINE HCL 25 MG PO TABS
25.0000 mg | ORAL_TABLET | Freq: Four times a day (QID) | ORAL | Status: DC | PRN
  Administered 2021-07-09 – 2021-07-10 (×3): 25 mg via ORAL
  Filled 2021-07-08 (×4): qty 1

## 2021-07-08 MED ORDER — LORAZEPAM 1 MG PO TABS
1.0000 mg | ORAL_TABLET | Freq: Three times a day (TID) | ORAL | Status: DC
  Administered 2021-07-09 – 2021-07-10 (×2): 1 mg via ORAL
  Filled 2021-07-08 (×2): qty 1

## 2021-07-08 MED ORDER — ONDANSETRON 4 MG PO TBDP: 4.0000 mg | ORAL_TABLET | Freq: Four times a day (QID) | ORAL | Status: DC | PRN

## 2021-07-08 MED ORDER — LOPERAMIDE HCL 2 MG PO CAPS: 2.0000 mg | ORAL_CAPSULE | ORAL | Status: DC | PRN

## 2021-07-08 MED ORDER — THIAMINE HCL 100 MG PO TABS
100.0000 mg | ORAL_TABLET | Freq: Every day | ORAL | Status: DC
  Administered 2021-07-08 – 2021-07-11 (×4): 100 mg via ORAL
  Filled 2021-07-08 (×9): qty 1

## 2021-07-08 MED ORDER — LORAZEPAM 1 MG PO TABS
1.0000 mg | ORAL_TABLET | Freq: Four times a day (QID) | ORAL | Status: DC | PRN
  Filled 2021-07-08: qty 1

## 2021-07-08 MED ORDER — LORAZEPAM 1 MG PO TABS: 1.0000 mg | ORAL_TABLET | Freq: Two times a day (BID) | ORAL | Status: DC

## 2021-07-08 NOTE — ED Provider Notes (Signed)
Emergency Medicine Observation Re-evaluation Note  Debra Rhodes is a 60 y.o. female, seen on rounds today.  Pt initially presented to the ED for complaints of Suicidal and Alcohol Problem Currently, the patient is sleeping.  Physical Exam  BP 105/65 (BP Location: Left Arm)   Pulse (!) 103   Temp 98.5 F (36.9 C) (Oral)   Resp 16   Ht '5\' 3"'$  (1.6 m)   Wt 59 kg   SpO2 94%   BMI 23.03 kg/m  Physical Exam General: Resting Comfortably Lungs: Resp even and unlabored Psych: Sleeping, calm  ED Course / MDM  EKG:EKG Interpretation  Date/Time:  Thursday July 07 2021 16:59:53 EDT Ventricular Rate:  96 PR Interval:  130 QRS Duration: 76 QT Interval:  348 QTC Calculation: 439 R Axis:   74 Text Interpretation: Normal sinus rhythm Normal ECG No previous ECGs available Confirmed by Ezequiel Essex [phone]) on 07/07/2021 11:59:57 PM  I have reviewed the labs performed to date as well as medications administered while in observation.  Recent changes in the last 24 hours include none.  Plan  Current plan is for inpatient.  [name] is not under involuntary commitment.     Truddie Hidden, MD 07/08/21 [phone]

## 2021-07-08 NOTE — Tx Team (Signed)
Initial Treatment Plan 07/08/2021 2:46 PM Debra Rhodes [id number]    PATIENT STRESSORS: Financial difficulties Health problems Substance abuse   PATIENT STRENGTHS: Ability for insight Capable of independent living Communication skills Supportive family/friends   PATIENT IDENTIFIED PROBLEMS: "Prevention"  "Coping mechanism"  Suicidal ideation  Depression  Alcohol abuse             DISCHARGE CRITERIA:  Ability to meet basic life and health needs Adequate post-discharge living arrangements Motivation to continue treatment in a less acute level of care  PRELIMINARY DISCHARGE PLAN: Attend aftercare/continuing care group Attend 12-step recovery group Outpatient therapy Return to previous living arrangement  PATIENT/FAMILY INVOLVEMENT: This treatment plan has been presented to and reviewed with the patient, Debra Rhodes, and/or family member.  The patient and family have been given the opportunity to ask questions and make suggestions.  Vela Prose, RN 07/08/2021, 2:46 PM

## 2021-07-08 NOTE — BH Assessment (Signed)
[place] Assessment Progress Note   Per Oneida Alar, NP, this pt requires psychiatric hospitalization at this time.  Lin[place], RN, [facility] has assigned pt to [facility] Rm 301-2 to the service of Dr Nelda Marseille.  Pt has signed Voluntary Admission and Consent for Treatment and signed forms have been faxed to [facility].  EDP Calvert Cantor, NP and pt's nurse, Eustaquio Maize, have been notified.  Please  send original paperwork along with pt via [facility] [employer], and call report to [phone].   Jalene Mullet, [place] Coordinator [phone]

## 2021-07-08 NOTE — BHH Suicide Risk Assessment (Signed)
[facility] Admission Suicide Risk Assessment   Nursing information obtained from:  Patient Demographic factors:  Living alone Current Mental Status:  Self-harm thoughts Loss Factors:  Decline in physical health, Financial problems / change in socioeconomic status Historical Factors:  Prior suicide attempts, relapse with ETOH prior to admission Risk Reduction Factors:  Positive social support, employed  Total Time Spent in Direct Patient Care:  I personally spent 40 minutes on the unit in direct patient care. The direct patient care time included face-to-face time with the patient, reviewing the patient's chart, communicating with other professionals, and coordinating care. Greater than 50% of this time was spent in counseling or coordinating care with the patient regarding goals of hospitalization, psycho-education, and discharge planning needs.  Principal Problem: MDD (major depressive disorder), recurrent severe, without psychosis ([place]) Diagnosis:  Principal Problem:   MDD (major depressive disorder), recurrent severe, without psychosis ([place]) Active Problems:   Alcohol abuse  Subjective Data: The patient is a 60y/o female with h/o alcohol use d/o, anxiety, and depression who was admitted voluntarily from [facility] for SI in the context of alcohol relapse. The patient was previously admitted to [facility] from 01/18/21-01/24/21 for suicide attempt via wrist cutting and for management of alcohol abuse. At the time of discharge, she was discharged on Campral '666mg'$  bid, Cymbalta '40mg'$  daily, Neurontin '300mg'$  tid, and Trazodone '50mg'$  qhs PRN insomnia and went to [place] for residential rehab. On assessment, patient states she was in rehab for 6 weeks during which time her Campral was discontinued and her Cymbalta was increased to '60mg'$ . After getting home, her outpatient provider increased her Cymbalta to '90mg'$  approximately 3 months ago and she states she was doing well. She had moved her Neurontin to PRN rather than  scheduled and was working with a therapist and attending daily AA meetings. She states that in the last few weeks, however, she started having increased anxiety related to work demands and work presentations and started having cravings for alcohol. In the several days prior to readmission, she states she relapsed and drank several tall box wine containers and then started feeling ashamed and guilty about her relapse which triggered SI. She states she had thoughts of driving her car into a tree but did not act on the SI. She has not had her Cymbalta in about 4 days and also thinks this has contributed to her mood instability. She describes feeling tearful and having difficulty with sleep and worsening anxiety. She can contract for safety on the unit and denies HI. She denies AVH during the day but states she sometimes hears "chanting" as she falls asleep or awakens from sleep. She denies paranoia or h/o mania. Recently she has been on Dayvigo for insomnia stating that Trazodone made her feel hung-over. She confirms that this makes her 3rd psychiatric admission to date and she only had one previous suicide attempt in the past. She most recnetly has been working with Thayer Headings at [facility]. See H&P for additional details.  Continued Clinical Symptoms:  Alcohol Use Disorder Identification Test Final Score (AUDIT): 7 The "Alcohol Use Disorders Identification Test", Guidelines for Use in Primary Care, Second Edition.  World Pharmacologist [employer]). Score between 0-7:  no or low risk or alcohol related problems. Score between 8-15:  moderate risk of alcohol related problems. Score between 16-19:  high risk of alcohol related problems. Score 20 or above:  warrants further diagnostic evaluation for alcohol dependence and treatment.  CLINICAL FACTORS:   Depression:   Comorbid alcohol abuse/dependence  Alcohol/Substance Abuse/Dependencies Previous Psychiatric Diagnoses and  Treatments  Musculoskeletal: Strength & Muscle Tone: within normal limits Gait & Station: normal Patient leans: N/A  Psychiatric Specialty Exam: Physical Exam Vitals reviewed.  HENT:     Head: Normocephalic.  Pulmonary:     Effort: Pulmonary effort is normal.  Neurological:     General: No focal deficit present.     Mental Status: She is alert.    Review of Systems - see H&P  Blood pressure 116/84, pulse 99, temperature 98.1 F (36.7 C), temperature source Oral, resp. rate 16, height '5\' 4"'$  (1.626 m), weight 59 kg, SpO2 99 %.Body mass index is 22.31 kg/m.  General Appearance:  casually dressed, adequate hygiene  Eye Contact:  Fair  Speech:  Clear and Coherent and Normal Rate  Volume:  Normal  Mood:  Anxious and Dysphoric  Affect:  Constricted and Tearful  Thought Process:  Goal Directed and Linear but ruminative about her relapse  Orientation:  Full (Time, Place, and Person)  Thought Content:   Ruminative about relapse; denies current AVH but describes possible hypnopompic or hypnagogic hallucinations as awakening and falling asleep; no delusions or paranoia  Suicidal Thoughts:   Had SI prior to admission but denies current SI, intent or plan and contracts for safety  Homicidal Thoughts:  No  Memory:  Recent;   Good  Judgement:  Fair  Insight:  Fair  Psychomotor Activity:  Normal  Concentration:  Concentration: Good and Attention Span: Good  Recall:  Good  Fund of Knowledge:  Good  Language:  Good  Akathisia:  Negative  Assets:  Communication Skills Desire for Improvement Housing Resilience Social Support Talents/Skills Vocational/Educational  ADL's:  Intact  Cognition:  WNL  Sleep:  Number of Hours: 6.75   COGNITIVE FEATURES THAT CONTRIBUTE TO RISK:  None    SUICIDE RISK:   Moderate:  Frequent suicidal ideation with limited intensity, and duration, some specificity in terms of plans, no associated intent, good self-control, limited dysphoria/symptomatology,  some risk factors present, and identifiable protective factors, including available and accessible social support.  PLAN OF CARE: Patient admitted voluntarily to [facility] for acute stabilization. Admission labs reviewed: ETOH 199 down to <10, Respiratory panel negative, BMP WNL, CBC WNL, UDS negative, UA with moderate blood and rare bacteria; EKG NSR 96bpm with QTC 461m. Repeat UA clean catch shows small blood and rare bacteria so will send for culture. UPT negative,TSH. 2.727.  Patient placed on CIWA protocol with scheduled Ativan taper and MVI and thiamine oral replacement and Ativan '1mg'$  for CIWA >10. She will be restarted on Cymbalta '60mg'$  daily and titrated back to '90mg'$  since she has been off the medication recently. She is not interested in restating Campral. She will be restarted on Neurontin for anxiety, cravings, and potential withdrawal. She will consider options of residential rehab/SAIOP after discharge.    I certify that inpatient services furnished can reasonably be expected to improve the patient's condition.   AHarlow Asa MD, FAPA 07/09/2021, 11:58 AM

## 2021-07-08 NOTE — ED Notes (Signed)
Pt off unit to [facility] per provider. Pt alert. Calm, cooperative, no s/s of distress.  DC information and belongings given to NT for transportation. Pt ambulatory off unit, escorted by NT. [facility] shuttle transported pt, NT accompanied.

## 2021-07-08 NOTE — Progress Notes (Signed)
Admission Note: Patient is a 60 year old female admitted to the unit under voluntary status for alcohol abuse and suicidal ideation with no plan. Patient reports that she relapsed three days ago after a five month period of sobriety.  Patient currently denies suicidal ideation and verbally contracts for safety while in the hospital.  Reports she hears voices and sees things when she drinks too much.  States she is here to work on prevention and coping mechanism.  Patient presents with a flat affect and depressed mood.  Admission plan of care reviewed and consent for treatment signed.  Skin and personal belongings completed.  Skin is dry and intact.  No contraband found.  Patient oriented to the unit, staff and room.  Routine safety checks initiated.  Verbalizes understanding of unit rules/protocols.  Patient is safe on the unit at this time.

## 2021-07-09 DIAGNOSIS — F332 Major depressive disorder, recurrent severe without psychotic features: Principal | ICD-10-CM

## 2021-07-09 LAB — URINALYSIS, COMPLETE (UACMP) WITH MICROSCOPIC
Bilirubin Urine: NEGATIVE
Glucose, UA: NEGATIVE mg/dL
Ketones, ur: NEGATIVE mg/dL
Leukocytes,Ua: NEGATIVE
Nitrite: NEGATIVE
Protein, ur: NEGATIVE mg/dL
Specific Gravity, Urine: 1.008 (ref 1.005–1.030)
pH: 7 (ref 5.0–8.0)

## 2021-07-09 LAB — PREGNANCY, URINE: Preg Test, Ur: NEGATIVE

## 2021-07-09 LAB — TSH: TSH: 2.727 u[IU]/mL (ref 0.350–4.500)

## 2021-07-09 MED ORDER — DULOXETINE HCL 60 MG PO CPEP
ORAL_CAPSULE | ORAL | Status: AC
  Filled 2021-07-09: qty 1

## 2021-07-09 MED ORDER — IBUPROFEN 400 MG PO TABS
400.0000 mg | ORAL_TABLET | Freq: Four times a day (QID) | ORAL | Status: DC | PRN
  Administered 2021-07-10: 400 mg via ORAL
  Filled 2021-07-09 (×2): qty 1

## 2021-07-09 MED ORDER — DULOXETINE HCL 60 MG PO CPEP
60.0000 mg | ORAL_CAPSULE | Freq: Every day | ORAL | Status: DC
  Administered 2021-07-09 – 2021-07-10 (×2): 60 mg via ORAL
  Filled 2021-07-09 (×2): qty 1

## 2021-07-09 MED ORDER — MELATONIN 5 MG PO TABS
5.0000 mg | ORAL_TABLET | Freq: Every day | ORAL | Status: DC
  Administered 2021-07-09 – 2021-07-10 (×2): 5 mg via ORAL
  Filled 2021-07-09 (×5): qty 1

## 2021-07-09 MED ORDER — TRAZODONE HCL 50 MG PO TABS
50.0000 mg | ORAL_TABLET | Freq: Every evening | ORAL | Status: DC | PRN
Start: 2021-07-09 — End: 2021-07-09
  Filled 2021-07-09 (×2): qty 1

## 2021-07-09 MED ORDER — GABAPENTIN 100 MG PO CAPS
100.0000 mg | ORAL_CAPSULE | Freq: Three times a day (TID) | ORAL | Status: DC
  Administered 2021-07-09 – 2021-07-10 (×3): 100 mg via ORAL
  Filled 2021-07-09 (×6): qty 1

## 2021-07-09 MED ORDER — GABAPENTIN 100 MG PO CAPS
ORAL_CAPSULE | ORAL | Status: AC
  Filled 2021-07-09: qty 1

## 2021-07-09 NOTE — Progress Notes (Signed)
Pt continues to be cooperative.  Pt complied with scheduled medications. Pt currently denies SI/ HI/AVH and pain to this writer during the shift. Pt interacts appropriately with others in the milieu. Pt verbally contracted for safety.   A: Support and encouragement was offered and accepted. Pt medications were administered per order. Pt home med was administered per MD order and pharmacy verification. Safety rounds continued and maintained Q 15 throughout the shift.    R: Safety maintained. Will continue to monitor and assess.    07/09/21 1643  Psych Admission Type (Psych Patients Only)  Admission Status Voluntary  Psychosocial Assessment  Patient Complaints Anxiety  Eye Contact Fair  Facial Expression Flat  Affect Anxious  Speech Soft  Interaction Minimal  Motor Activity Slow  Appearance/Hygiene Unremarkable  Behavior Characteristics Cooperative;Appropriate to situation  Mood Depressed  Thought Process  Coherency WDL  Content WDL  Delusions WDL;None reported or observed  Perception WDL  Hallucination None reported or observed  Judgment WDL  Confusion None  Danger to Self  Current suicidal ideation? Denies  Danger to Others  Danger to Others None reported or observed

## 2021-07-09 NOTE — Progress Notes (Signed)
[facility] Group Notes:  (Nursing/MHT/Case Management/Adjunct)  Date:  07/09/2021  Time:  2000 Type of Therapy:   wrap up group  Participation Level:  Active  Participation Quality:  Appropriate, Attentive, Sharing, and Supportive  Affect:  Appropriate  Cognitive:  Alert  Insight:  Improving  Engagement in Group:  Engaged  Modes of Intervention:  Clarification, Education, and Support  Summary of Progress/Problems: Positive thinking and positive change were discussed.   [name] 07/09/2021, 8:50 PM

## 2021-07-09 NOTE — BHH Group Notes (Signed)
Postives, family , music, living life.

## 2021-07-09 NOTE — H&P (Addendum)
Psychiatric Admission Assessment Adult  Patient Identification: Debra Rhodes MRN:  [id number] Date of Evaluation:  07/09/2021 Chief Complaint:  MDD (major depressive disorder) [F32.9] Principal Diagnosis: MDD (major depressive disorder), recurrent severe, without psychosis ([place]) Diagnosis:  Principal Problem:   MDD (major depressive disorder), recurrent severe, without psychosis ([place]) Active Problems:   Alcohol abuse  History of Present Illness: Debra Rhodes is a 60 year old female with a past psychiatric history significant for alcohol abuse, anxiety, and major depressive disorder, recurrent, severe without psychosis. She was admitted to [facility] voluntarily after presenting to [facility] with SI after a relapse on alcohol. This represents her third psychiatric admission overall. She has one previous [facility] admission from 3/1 to 3/7 2022 for a suicide attempt by cutting her wrist. She was admitted to [facility] for crisis management and medication management. Patient stated she has been sober for 5 months after completing a rehab stay at [facility] after she was discharged from [facility] in March. She stated she had been doing well until recently when her job became more stressful and she took a drink instead of calling her [place] sponsor. She stated she has been attending [place] meetings as often as she can but her job requires her to travel and she often can not find the time. She stated the increase in work demands has caused her anxiety to increase. She stated she always feels people are talking about her and this leads her to go to work functions and other places when she really does not want to . She stated many work functions have free flowing alcohol but she feels she has to attend. She stated she has 3 daughters ages 26,30 and 68 who have seen her go through getting sober and relapsing multiple times and she feels enormous guilt over this. She stated "I have to earn back their trust and each time it is harder and harder to do."  She stated she did not reach out to her [place] sponsor or anyone else until after she had relapsed. She stated her anxiety became so overwhelming that her brain told her the only thing that would help was alcohol to take the edge off and she drank. This in turn led to her suicidal thoughts to drive her car into a tree. She did not attempt to act on her thoughts and instead went to the emergency room for help. She had missed 4 days of her Cymbalta prior to her relapse. Today, she is tearful and anxious. She feels guilt and overwhelming shame for her relapse. She has withdrawal symptoms of a headache, nausea and alcohol cravings. She endorsed difficulty concentrating and poor sleep with racing thoughts. She has been prescribed Dayvigo for insomnia and stated trazodone makes her feel hung-over. She denies suicidal thoughts, intent or plan today. She denies homicidal thoughts, paranoia, and delusions. She denies mania or a history of mania. We will restart her Cymbalta at 60 mg today, due to her missing it for a few days. She stated she feels best when she is on Cymbalta 90 mg, we will increase dose in 1-2 days. She was restarted on her Gabapentin 100 mg TID, she had been taking her Gabapentin PRN instead of scheduled. Patient stated she dose not want to go to rehab again and instead will go back to AA. She stated her Campral was discontinued when she was in rehab because she was having mood swings and "struggling" with the medication side effects. Of note, she has recently started a new  relationship, her boyfriend is also in [place] and she stated he is super supportive, they have known each other for 2 years. Today, she denies SI/HI/AVH, paranoia and delusions. She is able to contract for safety on the unit. She stated she feels if she was discharged today she would not be safe because she would drink.   Associated Signs/Symptoms: Depression Symptoms:  depressed mood, anhedonia, feelings of  worthlessness/guilt, difficulty concentrating, hopelessness, suicidal thoughts without plan, anxiety, loss of energy/fatigue, disturbed sleep, Duration of Depression Symptoms: Greater than two weeks  (Hypo) Manic Symptoms:   Patient denied  Anxiety Symptoms:  Excessive Worry, Social Anxiety, Psychotic Symptoms:   N/A PTSD Symptoms: Negative Total Time spent with patient: 1 hour  Past Psychiatric History: Patient has a longstanding history of depression as well as alcohol dependence.  She has been and detox and rehab in the past.  She stated the last time she was in rehab was approximately 4 years ago.  She has had success with sobriety for an extended period of time with [facility].  She has been in treatment for depression with a nurse practitioner locally.  She has been on paroxetine for depression.  Previously we will attempt to switch her to sertraline, but she did worse with that.  She admitted to intermittent compliance with the paroxetine.  Is the patient at risk to self? Yes.    Has the patient been a risk to self in the past 6 months? No.  Has the patient been a risk to self within the distant past? Yes.    Is the patient a risk to others? No.  Has the patient been a risk to others in the past 6 months? No.  Has the patient been a risk to others within the distant past? No.   Prior Inpatient Therapy:  Yes Prior Outpatient Therapy:  Yes  Alcohol Screening: Patient refused Alcohol Screening Tool: Yes 1. How often do you have a drink containing alcohol?: 2 to 4 times a month 2. How many drinks containing alcohol do you have on a typical day when you are drinking?: 3 or 4 3. How often do you have six or more drinks on one occasion?: Less than monthly AUDIT-C Score: 4 4. How often during the last year have you found that you were not able to stop drinking once you had started?: Less than monthly 5. How often during the last year have you failed to do what was normally  expected from you because of drinking?: Never 6. How often during the last year have you needed a first drink in the morning to get yourself going after a heavy drinking session?: Never 7. How often during the last year have you had a feeling of guilt of remorse after drinking?: Never 8. How often during the last year have you been unable to remember what happened the night before because you had been drinking?: Never 9. Have you or someone else been injured as a result of your drinking?: No 10. Has a relative or friend or a doctor or another health worker been concerned about your drinking or suggested you cut down?: Yes, but not in the last year Alcohol Use Disorder Identification Test Final Score (AUDIT): 7 Alcohol Brief Interventions/Follow-up: Alcohol education/Brief advice Substance Abuse History in the last 12 months:  Yes.   Consequences of Substance Abuse: Medical Consequences:  Contributory to this hospital admission, alcohol intoxication with suicidal ideation Family Consequences:  Guilt and shame, loss of trust with family  Withdrawal Symptoms:   Headaches Nausea Previous Psychotropic Medications: Yes  Psychological Evaluations: Yes  Past Medical History:  Past Medical History:  Diagnosis Date   Anxiety    Depression    PONV (postoperative nausea and vomiting)     Past Surgical History:  Procedure Laterality Date   AUGMENTATION MAMMAPLASTY Bilateral 1996   BACK SURGERY     CESAREAN SECTION     FRACTURE SURGERY     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  The brother also had substance issues.  Father had substance issues.  Unspecified psychiatric problems with others in her family. Tobacco Screening:   Social History:  Social History   Substance and Sexual Activity  Alcohol Use Yes   Comment: binge drinking- was in recovery for past 4 years     Social History   Substance and Sexual Activity  Drug Use No    Additional Social History:   Allergies:   Allergies  Allergen Reactions   Latex Shortness Of Breath, Swelling and Rash   Lab Results:  Results for orders placed or performed during the hospital encounter of 07/08/21 (from the past 48 hour(s))  Urinalysis, Complete w Microscopic     Status: Abnormal   Collection Time: 07/08/21  7:38 PM  Result Value Ref Range   Color, Urine YELLOW YELLOW   APPearance CLOUDY (A) CLEAR   Specific Gravity, Urine 1.008 1.005 - 1.030   pH 7.0 5.0 - 8.0   Glucose, UA NEGATIVE NEGATIVE mg/dL   Hgb urine dipstick SMALL (A) NEGATIVE   Bilirubin Urine NEGATIVE NEGATIVE   Ketones, ur NEGATIVE NEGATIVE mg/dL   Protein, ur NEGATIVE NEGATIVE mg/dL   Nitrite NEGATIVE NEGATIVE   Leukocytes,Ua NEGATIVE NEGATIVE   RBC / HPF 0-5 0 - 5 RBC/hpf   WBC, UA 0-5 0 - 5 WBC/hpf   Bacteria, UA RARE (A) NONE SEEN   Squamous Epithelial / LPF 11-20 0 - 5    Comment: Performed at [facility], [place] [address] [place], [place] [address]  Pregnancy, urine     Status: None   Collection Time: 07/08/21  7:38 PM  Result Value Ref Range   Preg Test, Ur NEGATIVE NEGATIVE    Comment: Performed at [facility], [place] [address] [place], [place] [address]  TSH     Status: None   Collection Time: 07/09/21  6:20 AM  Result Value Ref Range   TSH 2.727 0.350 - 4.500 uIU/mL    Comment: Performed by a 3rd Generation assay with a functional sensitivity of <=0.01 uIU/mL. Performed at [facility],  [place] [address] [place], [place] [address]     Blood Alcohol level:  Lab Results  Component Value Date   ETH <10 07/08/2021   ETH 199 (H) [id number]    Metabolic Disorder Labs:  Lab Results  Component Value Date   HGBA1C 5.2 01/19/2021   MPG 102.54 01/19/2021  No results found for: PROLACTIN Lab Results  Component Value Date   CHOL 300 (H) 01/19/2021   TRIG 41 01/19/2021   HDL 120 01/19/2021   CHOLHDL 2.5 01/19/2021   VLDL 8 01/19/2021   LDLCALC 172 (H) 01/19/2021    Current Medications: Current Facility-Administered Medications  Medication Dose Route Frequency Provider Last Rate Last Admin   DULoxetine (CYMBALTA) 60 MG DR capsule            gabapentin (NEURONTIN) 100 MG capsule            DULoxetine (CYMBALTA) DR capsule 60 mg  60 mg Oral Daily Nelda Marseille, Rickiya Picariello E, MD   60 mg at 07/09/21 1218   feeding supplement (ENSURE ENLIVE / ENSURE PLUS) liquid 237 mL  237 mL Oral BID BM Nelda Marseille, Ksenia Kunz E, MD   237 mL at 07/08/21 1557   gabapentin (NEURONTIN) capsule 100 mg  100 mg Oral TID Harlow Asa, MD   100 mg at 07/09/21 1217   hydrOXYzine (ATARAX/VISTARIL) tablet 25 mg  25 mg Oral Q6H PRN Harlow Asa, MD       ibuprofen (ADVIL) tablet 400 mg  400 mg Oral Q6H PRN Ethelene Hal, NP       loperamide (IMODIUM) capsule 2-4 mg  2-4 mg Oral PRN Harlow Asa, MD       LORazepam (ATIVAN) tablet 1 mg  1 mg Oral Q6H PRN Briant Cedar, MD       LORazepam (ATIVAN) tablet 1 mg  1 mg Oral TID Harlow Asa, MD       Followed by   Derrill Memo ON 07/10/2021] LORazepam (ATIVAN) tablet 1 mg  1 mg Oral BID Harlow Asa, MD       Followed by   Derrill Memo ON 07/12/2021] LORazepam (ATIVAN) tablet 1 mg  1 mg Oral Daily Nelda Marseille, Relena Ivancic E, MD       multivitamin with minerals tablet 1 tablet  1 tablet Oral Daily Viann Fish E, MD   1 tablet at 07/09/21 0840   ondansetron (ZOFRAN-ODT) disintegrating tablet 4 mg  4 mg Oral Q6H PRN Briant Cedar, MD       thiamine  tablet 100 mg  100 mg Oral Daily Leevy-Johnson, Brooke A, NP   100 mg at 07/09/21 0840   traZODone (DESYREL) tablet 50 mg  50 mg Oral QHS,MR X 1 Ethelene Hal, NP       PTA Medications: Medications Prior to Admission  Medication Sig Dispense Refill Last Dose   antiseptic oral rinse (BIOTENE) LIQD 15 mLs by Mouth Rinse route as needed for dry mouth. (Patient not taking: No sig reported) 237 mL 0    cetirizine (ZYRTEC) 10 MG tablet Take 10 mg by mouth daily.      Cyanocobalamin (VITAMIN B 12 PO) Take 1 tablet by mouth daily.      DULoxetine (CYMBALTA) 30 MG capsule Take 1 capsule (30 mg total) by mouth daily. Take with a 60 mg capsule to equal total daily dose of 90 mg. 90 capsule 1    DULoxetine (CYMBALTA) 60 MG capsule Take 1 capsule (60 mg total) by mouth daily. Take with a 30 mg capsule to equal total daily dose of 90 mg. 90 capsule 1    estradiol-norethindrone (COMBIPATCH) 0.05-0.14 MG/DAY Place 1 patch onto the skin once a week.      Ferrous Sulfate (IRON PO) Take 1 tablet by mouth daily.      gabapentin (NEURONTIN) 300 MG  capsule Take 1 capsule (300 mg total) by mouth 3 (three) times daily. (Patient taking differently: Take 300 mg by mouth 3 (three) times daily as needed (anxiety).) 90 capsule 0    ibuprofen (ADVIL) 200 MG tablet Take 400-600 mg by mouth every 6 (six) hours as needed for headache.      [START ON 08/16/2021] Lemborexant (DAYVIGO) 5 MG TABS Take 5 mg by mouth at bedtime. 30 tablet 2    methylphenidate 18 MG PO CR tablet Take 1 tablet (18 mg total) by mouth daily. (Patient taking differently: Take 18 mg by mouth daily as needed (focus).) 30 tablet 0    [START ON 08/09/2021] methylphenidate 18 MG PO CR tablet Take 1 tablet (18 mg total) by mouth daily. (Patient not taking: Reported on 07/08/2021) 30 tablet 0    [START ON 07/12/2021] methylphenidate 18 MG PO CR tablet Take 1 tablet (18 mg total) by mouth daily. (Patient not taking: Reported on 07/08/2021) 30 tablet 0    [START  ON 09/06/2021] methylphenidate 18 MG PO CR tablet Take 1 tablet (18 mg total) by mouth daily. (Patient not taking: Reported on 07/08/2021) 30 tablet 0    Multiple Vitamin (MULTIVITAMIN) tablet Take 1 tablet by mouth daily.      polyvinyl alcohol (LIQUIFILM TEARS) 1.4 % ophthalmic solution Place 1 drop into both eyes as needed for dry eyes.       Musculoskeletal: Strength & Muscle Tone: within normal limits Gait & Station: normal Patient leans: N/A  Psychiatric Specialty Exam:  Presentation  General Appearance: Appropriate for Environment; Casual; Fairly Groomed  Eye Contact:Good  Speech:Clear and Coherent; Normal Rate  Speech Volume:Normal  Handedness:Right  Mood and Affect  Mood:Anxious; Depressed  Affect:Appropriate; Congruent; Depressed; Tearful  Thought Process  Thought Processes:Coherent; Goal Directed; Linear  Duration of Psychotic Symptoms: No data recorded Past Diagnosis of Schizophrenia or Psychoactive disorder: No  Descriptions of Associations:Intact  Orientation:Full (Time, Place and Person)  Thought Content:Logical; Rumination  Hallucinations:Hallucinations: None  Ideas of Reference:None  Suicidal Thoughts:Suicidal Thoughts: No  Homicidal Thoughts:Homicidal Thoughts: No  Sensorium  Memory:Immediate Good; Recent Fair; Remote Fair  Judgment:Fair  Insight:Fair  Executive Functions  Concentration:Fair  Attention Span:Good  [place] of Knowledge:Good  Language:Good  Psychomotor Activity  Psychomotor Activity:Psychomotor Activity: Normal  Assets  Assets:Communication Skills; Desire for Improvement; Financial Resources/Insurance; Housing; Resilience; Social Support; Transportation; Vocational/Educational  Sleep  Sleep:Sleep: Fair Number of Hours of Sleep: 6.75  Physical Exam: Physical Exam Vitals and nursing note reviewed.  HENT:     Head: Normocephalic.  Pulmonary:     Effort: Pulmonary effort is normal.   Musculoskeletal:        General: Normal range of motion.     Cervical back: Normal range of motion.  Neurological:     General: No focal deficit present.     Mental Status: She is alert and oriented to person, place, and time.  Psychiatric:        Attention and Perception: She does not perceive auditory or visual hallucinations.        Mood and Affect: Mood is anxious and depressed.        Speech: Speech normal.        Behavior: Behavior normal. Behavior is cooperative.        Thought Content: Thought content normal. Thought content is not paranoid or delusional. Thought content does not include homicidal or suicidal ideation. Thought content does not include homicidal or suicidal plan.  Cognition and Memory: Cognition normal.   Review of Systems  Constitutional:  Negative for fever.  HENT:  Negative for congestion, sinus pain and sore throat.   Respiratory:  Negative for cough and shortness of breath.   Cardiovascular:  Negative for chest pain.  Gastrointestinal:  Positive for nausea.  Genitourinary: Negative.   Musculoskeletal: Negative.   Neurological:  Positive for headaches.  Blood pressure 116/84, pulse 99, temperature 98.1 F (36.7 C), temperature source Oral, resp. rate 16, height '5\' 4"'$  (1.626 m), weight 59 kg, SpO2 99 %. Body mass index is 22.31 kg/m.  Treatment Plan Summary: Daily contact with patient to assess and evaluate symptoms and progress in treatment and Medication management  Observation Level/Precautions:  15 minute checks  Laboratory:  UA with culture Labs reviewed: CMP WNL, CBC with Diff WNL, Glucose 90, Urine pregnancy negative, TSH 2.727, UA with small Hgb, cloudy, rare bacteria. 8/19 ETOH level <10, EKG with QTc 439, NSR.   Psychotherapy:  Group therapy  Medications:  See MAR  Consultations:  TBD  Discharge Concerns:  Safety, alcohol relapse, medication compliance  Estimated LOS: 3-5 days  Other:     Physician Treatment Plan for Primary  Diagnosis: MDD (major depressive disorder), recurrent severe, without psychosis ([place]) Long Term Goal(s): Improvement in symptoms so as ready for discharge  Short Term Goals: Ability to identify changes in lifestyle to reduce recurrence of condition will improve, Ability to verbalize feelings will improve, Ability to identify and develop effective coping behaviors will improve, and Ability to identify triggers associated with substance abuse/mental health issues will improve  MDD recurrent, severe without psychosis: -Restart Cymbalta at 60 mg PO daily, increase in 1-2 days to 90 mg daily.   Anxiety:  -Start Vistaril 25 mg PO TID PRN  - Start Neurontin '100mg'$  tid for anxiety and potential cravings/withdrawal  Insomnia:  -Start Melatonin 5 mg PO at bedtime  Abnormal UA - Urine culture pending  Physician Treatment Plan for Secondary Diagnosis: Principal Problem:   MDD (major depressive disorder), recurrent severe, without psychosis ([place]) Active Problems:   Alcohol abuse  Long Term Goal(s): Improvement in symptoms so as ready for discharge  Short Term Goals: Ability to identify changes in lifestyle to reduce recurrence of condition will improve, Ability to verbalize feelings will improve, Ability to disclose and discuss suicidal ideas, Ability to identify and develop effective coping behaviors will improve, Compliance with prescribed medications will improve, and Ability to identify triggers associated with substance abuse/mental health issues will improve  Alcohol abuse:/withdrawal  -Restart Gabapentin 100 mg PO TID for alcohol cravings and withdrawal/anxiety -Start Ibuprofen 400 mg PO every 6 hrs PRN for withdrawal headache  CIWA protocol:   Ativan taper x 4 days Multivitamin PO daily Thiamine 100 mg PO daily  Zofran 4 mg q 6 hrs PO PRN for nausea Imodium 2-4 mg PO PRN for diarrhea  Continue every 15 minute safety checks Encourage participation in the therapeutic milieu Discharge  planning in progress  I certify that inpatient services furnished can reasonably be expected to improve the patient's condition.    Ethelene Hal, NP 8/20/20223:29 PM
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
## 2021-07-09 NOTE — Progress Notes (Signed)
Adult Psychoeducational Group Note  Date:  07/09/2021 Time:  8:56 AM  Group Topic/Focus:  Goals Group:   The focus of this group is to help patients establish daily goals to achieve during treatment and discuss how the patient can incorporate goal setting into their daily lives to aide in recovery.  Participation Level:  Active  Participation Quality:  Appropriate  Affect:  Appropriate  Cognitive:  Appropriate  Insight: Appropriate  Engagement in Group:  Engaged  Modes of Intervention:  Discussion  Additional Comments:  Pt attended group and participated in discussion.  [name] R [name] 07/09/2021, 8:56 AM

## 2021-07-09 NOTE — Progress Notes (Signed)
DAR NOTE: Patient presents with anxious affect and depressed mood.  Denies pain, auditory and visual hallucinations. Maintained on routine safety checks.  Medications given as prescribed.  Support and encouragement offered as needed. Will continue to monitor. 

## 2021-07-09 NOTE — BHH Group Notes (Signed)
[place] Group Notes:  (Nursing/MHT/Case Management/Adjunct)  Date: 07/08/2021  Time:  8:00 PM  Type of Therapy:  Group Therapy  Participation Level:  Active  Participation Quality:  Attentive  Affect:  Appropriate  Cognitive:  Appropriate  Insight:  Appropriate  Engagement in Group:  Developing/Improving  Modes of Intervention:  Discussion  Summary of Progress/Problems:  Debra Rhodes  07/08/2021, 8:00 AM

## 2021-07-10 MED ORDER — DULOXETINE HCL 30 MG PO CPEP
90.0000 mg | ORAL_CAPSULE | Freq: Every day | ORAL | Status: DC
  Administered 2021-07-11: 90 mg via ORAL
  Filled 2021-07-10 (×4): qty 3

## 2021-07-10 MED ORDER — GABAPENTIN 100 MG PO CAPS
100.0000 mg | ORAL_CAPSULE | Freq: Every day | ORAL | Status: DC
  Administered 2021-07-11: 100 mg via ORAL
  Filled 2021-07-10 (×3): qty 1

## 2021-07-10 MED ORDER — GABAPENTIN 100 MG PO CAPS
200.0000 mg | ORAL_CAPSULE | Freq: Every day | ORAL | Status: DC
  Administered 2021-07-10: 200 mg via ORAL
  Filled 2021-07-10 (×3): qty 2

## 2021-07-10 NOTE — BHH Group Notes (Signed)
Adult Psychoeducational Group Note  Date:  07/10/2021 Time:  11:07 AM  Group Topic/Focus:  Healthy Communication:   The focus of this group is to discuss communication, barriers to communication, as well as healthy ways to communicate with others.  Participation Level:  Active  Participation Quality:  Attentive  Affect:  Appropriate  Cognitive:  Appropriate  Insight: Appropriate and Good  Engagement in Group:  Supportive  Modes of Intervention:  Socialization and Support  Additional Comments:    Chase Picket 07/10/2021, 11:07 AM

## 2021-07-10 NOTE — Progress Notes (Signed)
   07/10/21 2241  Psych Admission Type (Psych Patients Only)  Admission Status Voluntary  Psychosocial Assessment  Patient Complaints None  Eye Contact Fair  Facial Expression Flat  Affect Anxious  Speech Soft  Interaction Minimal  Motor Activity Slow  Appearance/Hygiene Unremarkable  Behavior Characteristics Cooperative;Appropriate to situation  Mood Pleasant;Depressed  Thought Process  Coherency WDL  Content WDL  Delusions WDL;None reported or observed  Perception WDL  Hallucination None reported or observed  Judgment WDL  Confusion None  Danger to Self  Current suicidal ideation? Denies  Danger to Others  Danger to Others None reported or observed

## 2021-07-10 NOTE — Progress Notes (Signed)
[place] Group Notes:  (Nursing/MHT/Case Management/Adjunct)  Date:  07/10/2021  Time:  2000  Type of Therapy:   wrap up group  Participation Level:  Active  Participation Quality:  Appropriate, Attentive, Sharing, and Supportive  Affect:  Appropriate  Cognitive:  Alert  Insight:  Improving  Engagement in Group:  Engaged  Modes of Intervention:  Clarification, Education, and Support  Summary of Progress/Problems: Positive thinking and self-care were discussed.   [name] 07/10/2021, 11:03 PM

## 2021-07-10 NOTE — Progress Notes (Addendum)
Debra Rhodes  MRN:  [id number] Subjective:  "I feel better today, I am   Objective:  Debra Rhodes is a 60 year old female with a past psychiatric history significant for alcohol abuse, anxiety, and major depressive disorder, recurrent, severe without psychosis. She was admitted to [facility] voluntarily after presenting to [facility] with SI after a relapse on alcohol. This represents her third psychiatric admission overall. She has one previous [facility] admission from 3/1 to 3/7 2022 for a suicide attempt by cutting her wrist. She was admitted to [facility] for crisis management and medication management.   Evaluation on the unit: Patient was seen, chart reviewed and case discussed with the treatment team. Patient stated she slept pretty good last night, record shows she slept 5.25 hours. She stated her appetite is good. Patient stated she feels better today and is requesting to stop the Ativan taper due to it making her too groggy during the day. She denies alcohol withdrawal symptoms wit ht exception of a headache. She stated her headache is likely from stress. She is worried about putting too much stress on the people she cares about because she keeps relapsing. She also stated she needs to call the women from [facility] that came to her house the morning of her relapse. We discussed at length the need to take care of herself and work her program. We also discussed avoidance and sabotage. She is interested in therapy after discharge. She does not want to go back to a residential rehab. She intends to go to AA. She is taking her medications as prescribed. She complained the mid-day gabapentin is making her too "loopy", I will move the mid-day dose and combine it with the evening dose. She is aware that gabapentin can help her with anxiety, alcohol craving and the neuropathic pain she has in her foot. She has questioned about Campral and naltrexone. She will follow up with her outpatient medication  management at [facility] and can discuss with them whether she needs to be on one of these medications. She has been on Campral before and it was discontinued at [facility] rehab because it was causing her to have mood swings and suicidal ideation. She rated her anxiety and depression at 7/10, 10 being the worst. She stated this is mostly because she feels guilt and shame for relapsing. She stated she is embarrassed to face the people from [place]. She denies SI/HI/AVH, paranoia and delusions. She is asking when she can be discharged. Patient is calm and cooperative.  Principal Problem: MDD (major depressive disorder), recurrent severe, without psychosis ([place]) Diagnosis: Principal Problem:   MDD (major depressive disorder), recurrent severe, without psychosis ([place]) Active Problems:   Alcohol abuse  Total Time spent with patient:  25 minutes  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   Procedure: TOTAL KNEE ARTHROPLASTY;  Surgeon: Paralee Cancel, MD;  Location: [facility];  Service: Orthopedics;  Laterality: Right;  70 mins   Family History:  Family History  Problem Relation Age of Onset   Anxiety disorder Sister    Depression Brother  Schizophrenia Brother    Suicidality Brother    Anxiety disorder Brother    Alcohol abuse Sister    Alcohol abuse Brother    Alcohol abuse Brother    Family Psychiatric  History: See H&P Social History:  Social History   Substance and Sexual Activity  Alcohol Use Yes   Comment: binge drinking- was in recovery for past 4 years     Social History   Substance and Sexual Activity  Drug Use No    Social History   Socioeconomic  History   Marital status: Divorced    Spouse name: Not on file   Number of children: Not on file   Years of education: Not on file   Highest education level: Not on file  Occupational History   Not on file  Tobacco Use   Smoking status: Never   Smokeless tobacco: Never  Vaping Use   Vaping Use: Never used  Substance and Sexual Activity   Alcohol use: Yes    Comment: binge drinking- was in recovery for past 4 years   Drug use: No   Sexual activity: Not Currently  Other Topics Concern   Not on file  Social History Narrative   Not on file   Social Determinants of Health   Financial Resource Strain: Not on file  Food Insecurity: Not on file  Transportation Needs: Not on file  Physical Activity: Not on file  Stress: Not on file  Social Connections: Not on file   Additional Social History:                         Sleep: Fair  Appetite:  Good  Current Medications: Current Facility-Administered Medications  Medication Dose Route Frequency Provider Last Rate Last Admin   DULoxetine (CYMBALTA) DR capsule 60 mg  60 mg Oral Daily Nelda Marseille, Arkie Tagliaferro E, MD   60 mg at 07/10/21 0834   feeding supplement (ENSURE ENLIVE / ENSURE PLUS) liquid 237 mL  237 mL Oral BID BM Nelda Marseille, Panhia Karl E, MD   237 mL at 07/08/21 1557   gabapentin (NEURONTIN) capsule 100 mg  100 mg Oral TID Harlow Asa, MD   100 mg at 07/10/21 0834   hydrOXYzine (ATARAX/VISTARIL) tablet 25 mg  25 mg Oral Q6H PRN Viann Fish E, MD   25 mg at 07/09/21 2055   ibuprofen (ADVIL) tablet 400 mg  400 mg Oral Q6H PRN Ethelene Hal, NP       loperamide (IMODIUM) capsule 2-4 mg  2-4 mg Oral PRN Nelda Marseille, Eloina Ergle E, MD       LORazepam (ATIVAN) tablet 1 mg  1 mg Oral Q6H PRN Briant Cedar, MD       LORazepam (ATIVAN) tablet 1 mg  1 mg Oral TID Viann Fish E, MD   1 mg at 07/10/21 [id number]   Followed by   LORazepam (ATIVAN) tablet 1 mg  1 mg Oral BID Nelda Marseille, Azad Calame E, MD       Followed by   Derrill Memo ON  07/12/2021] LORazepam (ATIVAN) tablet 1 mg  1 mg Oral Daily Nelda Marseille, Esequiel Kleinfelter E, MD       melatonin tablet 5 mg  5 mg Oral QHS Ethelene Hal, NP   5 mg at 07/09/21 2055   multivitamin with minerals tablet 1 tablet  1 tablet Oral Daily Viann Fish E, MD   1 tablet at 07/09/21 0840   ondansetron (ZOFRAN-ODT) disintegrating tablet 4 mg  4 mg  Oral Q6H PRN Briant Cedar, MD       thiamine tablet 100 mg  100 mg Oral Daily Leevy-Johnson, Brooke A, NP   100 mg at 07/09/21 0840    Lab Results:  Results for orders placed or performed during the hospital encounter of 07/08/21 (from the past 48 hour(s))  Urinalysis, Complete w Microscopic     Status: Abnormal   Collection Time: 07/08/21  7:38 PM  Result Value Ref Range   Color, Urine YELLOW YELLOW   APPearance CLOUDY (A) CLEAR   Specific Gravity, Urine 1.008 1.005 - 1.030   pH 7.0 5.0 - 8.0   Glucose, UA NEGATIVE NEGATIVE mg/dL   Hgb urine dipstick SMALL (A) NEGATIVE   Bilirubin Urine NEGATIVE NEGATIVE   Ketones, ur NEGATIVE NEGATIVE mg/dL   Protein, ur NEGATIVE NEGATIVE mg/dL   Nitrite NEGATIVE NEGATIVE   Leukocytes,Ua NEGATIVE NEGATIVE   RBC / HPF 0-5 0 - 5 RBC/hpf   WBC, UA 0-5 0 - 5 WBC/hpf   Bacteria, UA RARE (A) NONE SEEN   Squamous Epithelial / LPF 11-20 0 - 5    Comment: Performed at [facility], [place] [address] [place], [place] [address]  Pregnancy, urine     Status: None   Collection Time: 07/08/21  7:38 PM  Result Value Ref Range   Preg Test, Ur NEGATIVE NEGATIVE    Comment: Performed at [facility], [place] [address] [place], [place] [address]  TSH     Status: None   Collection Time: 07/09/21  6:20 AM  Result Value Ref Range   TSH 2.727 0.350 - 4.500 uIU/mL    Comment: Performed by a 3rd Generation assay with a functional sensitivity of <=0.01 uIU/mL. Performed at [facility], [place] [address], [place], [place] [address]     Blood Alcohol level:  Lab  Results  Component Value Date   ETH <10 07/08/2021   ETH 199 (H) [id number]    Metabolic Disorder Labs: Lab Results  Component Value Date   HGBA1C 5.2 01/19/2021   MPG 102.54 01/19/2021   No results found for: PROLACTIN Lab Results  Component Value Date   CHOL 300 (H) 01/19/2021   TRIG 41 01/19/2021   HDL 120 01/19/2021   CHOLHDL 2.5 01/19/2021   VLDL 8 01/19/2021   LDLCALC 172 (H) 01/19/2021    Physical Findings: AIMS:  , ,  ,  ,    CIWA:  CIWA-Ar Total: 0 COWS:     Musculoskeletal: Strength & Muscle Tone: within normal limits Gait & Station: normal Patient leans: N/A  Psychiatric Specialty Exam:  Presentation  General Appearance: Appropriate for Environment; Casual; Fairly Groomed  Eye Contact:Good  Speech:Clear and Coherent; Normal Rate  Speech Volume:Normal  Handedness:Right   Mood and Affect  Mood:Anxious; Depressed  Affect:Appropriate; Congruent; Depressed; Tearful   Thought Process  Thought Processes:Coherent; Goal Directed; Linear  Descriptions of Associations:Intact  Orientation:Full (Time, Place and Person)  Thought Content:Logical; Rumination  History of Schizophrenia/Schizoaffective disorder:No  Duration of Psychotic Symptoms:No data recorded Hallucinations:Hallucinations: None  Ideas of Reference:None  Suicidal Thoughts:Suicidal Thoughts: No  Homicidal Thoughts:Homicidal Thoughts: No   Sensorium  Memory:Immediate Good; Recent Fair; Remote Fair  Judgment:Fair  Insight:Fair   Executive Functions  Concentration:Fair  Attention Span:Good  [place] of Knowledge:Good  Language:Good   Psychomotor Activity  Psychomotor Activity:Psychomotor Activity: Normal   Assets  Assets:Communication Skills; Desire for Improvement; Financial Resources/Insurance; Housing; Resilience; Museum/gallery exhibitions officer; Vocational/Educational   Sleep  Sleep:Sleep: Fair Number of Hours of Sleep: 6.75    Physical  Exam: Physical Exam Vitals and nursing note reviewed.  Constitutional:      Appearance: Normal appearance.  HENT:     Head: Normocephalic.  Pulmonary:     Effort: Pulmonary effort is normal.  Musculoskeletal:        General: Normal range of motion.     Cervical back: Normal range of motion.  Neurological:     General: No focal deficit present.     Mental Status: She is alert and oriented to person, place, and time.  Psychiatric:        Attention and Perception: Attention normal. She perceives auditory and visual hallucinations.        Mood and Affect: Mood normal.        Speech: Speech normal.        Behavior: Behavior normal. Behavior is cooperative.        Thought Content: Thought content normal. Thought content is not paranoid or delusional. Thought content does not include homicidal or suicidal ideation. Thought content does not include homicidal or suicidal plan.        Cognition and Memory: Cognition normal.   Review of Systems  Constitutional:  Negative for fever.  HENT: Negative.  Negative for congestion, sinus pain and sore throat.   Respiratory:  Negative for cough and shortness of breath.   Cardiovascular:  Negative for chest pain.  Gastrointestinal: Negative.   Genitourinary: Negative.   Musculoskeletal: Negative.   Neurological: Negative.    Blood pressure 111/82, pulse 91, temperature 98.5 F (36.9 C), temperature source Oral, resp. rate 16, height '5\' 4"'$  (1.626 m), weight 59 kg, SpO2 100 %. Body mass index is 22.31 kg/m.   Treatment Plan Summary: Daily contact with patient to assess and evaluate symptoms and progress in treatment and Medication management  MDD recurrent, severe without psychosis: -Increase to Cymbalta 90 mg daily.    Anxiety:  -Start Vistaril 25 mg PO TID PRN  - Change Neurontin to 100 mg in the morning and 200 mg in the evening   Insomnia:  -Start Melatonin 5 mg PO at bedtime   Abnormal UA - Urine culture pending  Alcohol  abuse:/withdrawal  -Change  Gabapentin to  100 mg in the morning and 200 mg in the evening.  -Continue Ibuprofen 400 mg PO every 6 hrs PRN for withdrawal headache   CIWA protocol:   Discontinue Ativan taper x 4 days Multivitamin PO daily Thiamine 100 mg PO daily  Zofran 4 mg q 6 hrs PO PRN for nausea Imodium 2-4 mg PO PRN for diarrhea   Continue every 15 minute safety checks Encourage participation in the therapeutic milieu Discharge planning in progress    Ethelene Hal, NP 07/10/2021, 2:35 PM

## 2021-07-10 NOTE — BHH Group Notes (Signed)
Adult Psychoeducational Group Note  Date:  07/10/2021 Time:  9:25 AM  Group Topic/Focus:  Goals Group:   The focus of this group is to help patients establish daily goals to achieve during treatment and discuss how the patient can incorporate goal setting into their daily lives to aide in recovery.  Participation Level:  Active  Participation Quality:  Appropriate  Affect:  Appropriate  Cognitive:  Appropriate  Insight: Appropriate  Engagement in Group:  Engaged  Modes of Intervention:  Orientation  Additional Comments:  Goal is to stay positive  Chase Picket 07/10/2021, 9:25 AM

## 2021-07-11 LAB — URINE CULTURE: Culture: NO GROWTH

## 2021-07-11 MED ORDER — DULOXETINE HCL 30 MG PO CPEP: 90.0000 mg | ORAL_CAPSULE | Freq: Every day | ORAL | 0 refills | Status: DC

## 2021-07-11 MED ORDER — MELATONIN 5 MG PO TABS: 5.0000 mg | ORAL_TABLET | Freq: Every day | ORAL | 0 refills | Status: DC

## 2021-07-11 MED ORDER — THIAMINE HCL 100 MG PO TABS: 100.0000 mg | ORAL_TABLET | Freq: Every day | ORAL | 0 refills | Status: DC

## 2021-07-11 MED ORDER — GABAPENTIN 100 MG PO CAPS: 100.0000 mg | ORAL_CAPSULE | Freq: Every day | ORAL | 0 refills | Status: DC

## 2021-07-11 NOTE — BHH Group Notes (Signed)
Spiritual care group on grief and loss facilitated by chaplain Janne Napoleon, [facility]   Group Goal:   Support / Education around grief and loss   Members engage in facilitated group support and psycho-social education.   Group Description:   Following introductions and group rules, group members engaged in facilitated group dialog and support around topic of loss, with particular support around experiences of loss in their lives. Group Identified types of loss (relationships / self / things) and identified patterns, circumstances, and changes that precipitate losses. Reflected on thoughts / feelings around loss, normalized grief responses, and recognized variety in grief experience. Group noted Worden's four tasks of grief in discussion.   Group drew on Adlerian / Rogerian, narrative, MI,   Patient Progress: Debra Rhodes present in group.  She did not participate much verbally, but was actively engaged in listening.  [place], Bcc Pager, [phone] 3:00 PM

## 2021-07-11 NOTE — Discharge Summary (Signed)
Physician Discharge Summary Note  Patient:  Debra Rhodes is an 60 y.o., female MRN:  [id number] DOB:  Mar 19, 1961 Patient phone:  [phone] (home)  Patient address:   [place] [address],  Total Time spent with patient: 30 minutes  Date of Admission:  07/08/2021 Date of Discharge: 07/11/2021 Reason for Admission:  (From MD's admission note): Debra Rhodes is a 60 year old female with a past psychiatric history significant for alcohol abuse, anxiety, and major depressive disorder, recurrent, severe without psychosis. She was admitted to [facility] voluntarily after presenting to [facility] with SI after a relapse on alcohol. This represents her third psychiatric admission overall. She has one previous [facility] admission from 3/1 to 3/7 2022 for a suicide attempt by cutting her wrist. She was admitted to [facility] for crisis management and medication management. Patient stated she has been sober for 5 months after completing a rehab stay at [facility] after she was discharged from [facility] in March. She stated she had been doing well until recently when her job became more stressful and she took a drink instead of calling her [place] sponsor. She stated she has been attending [place] meetings as often as she can but her job requires her to travel and she often can not find the time. She stated the increase in work demands has caused her anxiety to increase. She stated she always feels people are talking about her and this leads her to go to work functions and other places when she really does not want to . She stated many work functions have free flowing alcohol but she feels she has to attend. She stated she has 3 daughters ages 41,30 and 40 who have seen her go through getting sober and relapsing multiple times and she feels enormous guilt over this. She stated "I have to earn back their trust and each time it is harder and harder to do." She stated she did not reach out to her [place] sponsor or anyone else until  after she had relapsed. She stated her anxiety became so overwhelming that her brain told her the only thing that would help was alcohol to take the edge off and she drank. This in turn led to her suicidal thoughts to drive her car into a tree. She did not attempt to act on her thoughts and instead went to the emergency room for help. She had missed 4 days of her Cymbalta prior to her relapse. Today, she is tearful and anxious. She feels guilt and overwhelming shame for her relapse. She has withdrawal symptoms of a headache, nausea and alcohol cravings. She endorsed difficulty concentrating and poor sleep with racing thoughts. She has been prescribed Dayvigo for insomnia and stated trazodone makes her feel hung-over. She denies suicidal thoughts, intent or plan today. She denies homicidal thoughts, paranoia, and delusions. She denies mania or a history of mania. We will restart her Cymbalta at 60 mg today, due to her missing it for a few days. She stated she feels best when she is on Cymbalta 90 mg, we will increase dose in 1-2 days. She was restarted on her Gabapentin 100 mg TID, she had been taking her Gabapentin PRN instead of scheduled. Patient stated she dose not want to go to rehab again and instead will go back to AA. She stated her Campral was discontinued when she was in rehab because she was having mood swings and "struggling" with the medication side effects. Of note, she has recently started  a new relationship, her boyfriend is also in [place] and she stated he is super supportive, they have known each other for 2 years. Today, she denies SI/HI/AVH, paranoia and delusions. She is able to contract for safety on the unit. She stated she feels if she was discharged today she would not be safe because she would drink.     Principal Problem: MDD (major depressive disorder), recurrent severe, without psychosis ([place]) Discharge Diagnoses: Principal Problem:   MDD (major depressive disorder), recurrent severe,  without psychosis ([place]) Active Problems:   Alcohol abuse   Past Psychiatric History: Patient has a longstanding history of depression as well as alcohol dependence.  She has been and detox and rehab in the past.  She stated the last time she was in rehab was approximately 4 years ago.  She has had success with sobriety for an extended period of time with [facility].  She has been in treatment for depression with a nurse practitioner locally.  She has been on paroxetine for depression.  Previously we will attempt to switch her to sertraline, but she did worse with that.  She admitted to intermittent compliance with the paroxetine.  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  The brother also had substance issues.  Father had substance issues.  Unspecified psychiatric problems with others in her family. Social History:  Social History   Substance and Sexual Activity  Alcohol Use Yes   Comment: binge drinking- was in recovery for past 4 years     Social History   Substance and Sexual Activity  Drug Use No    Social History   Socioeconomic History   Marital status: Divorced    Spouse name: Not on  file   Number of children: Not on file   Years of education: Not on file   Highest education level: Not on file  Occupational History   Not on file  Tobacco Use   Smoking status: Never   Smokeless tobacco: Never  Vaping Use   Vaping Use: Never used  Substance and Sexual Activity   Alcohol use: Yes    Comment: binge drinking- was in recovery for past 4 years   Drug use: No   Sexual activity: Not Currently  Other Topics Concern   Not on file  Social History Narrative   Not on file   Social Determinants of Health   Financial Resource Strain: Not on file  Food Insecurity: Not on file  Transportation Needs: Not on file  Physical Activity: Not on file  Stress: Not on file  Social Connections:  Not on file    Hospital Course:  After the above admission evaluation, Mayleigh's presenting symptoms were noted. She was recommended for mood stabilization treatments. The medication regimen targeting those presenting symptoms were discussed with her & initiated with her consent. Her home medications Cymbalta and Gabapentin were restarted. Her UDS on arrival to the ED was negative, her BAL was 199. She did not develop any alcohol withdrawal symptoms but did not receive alcohol detoxification treatments. She was placed on CIWA protocol with an Ativan taper. The Ativan taper was discontinued on 07/10/2021. She was medicated, stabilized & discharged on the medications as listed on her discharge medication list below. Besides the mood stabilization treatments, Tikisha was also enrolled & participated in the group counseling sessions being offered & held on this unit. She learned coping skills. She presented no other significant pre-existing medical issues that required treatment. She tolerated his treatment regimen without any adverse effects or reactions reported.   During the course of her hospitalization, the 15-minute checks were adequate to ensure patient's safety. Gwenetta did not display any dangerous,  violent or suicidal behavior on the unit.  She interacted with patients & staff appropriately, participated appropriately in the group sessions/therapies. Her medications were addressed & adjusted to meet her needs. She was recommended for outpatient follow-up care & medication management upon discharge to assure continuity of care & mood stability.  At the time of discharge patient is not reporting any acute suicidal/homicidal ideations. She feels more confident about her self-care & in managing his mental health. She currently denies any new issues or concerns. Education and supportive counseling provided throughout her hospital stay & upon discharge.   Today upon her discharge evaluation with the attending psychiatrist, Zuriya shares she is doing well. She denies any other specific concerns. She is sleeping well. Her appetite is good. He/She denies other physical complaints. She denies AH/VH, delusional thoughts or paranoia. She does not appear to be responding to any internal stimuli. She feels that her medications have been helpful & is in agreement to continue her current treatment regimen as recommended. She was able to engage in safety planning including plan to return to [facility] or contact emergency services if she feels unable to maintain her own safety or the safety of others. Pt had no further questions, comments, or concerns. She left [facility] with all personal belongings in no apparent distress. Transportation home via private vehicle with her daughter.    Physical Findings: AIMS:  , ,  ,  ,    CIWA:  CIWA-Ar Total: 1 COWS:     Musculoskeletal: Strength & Muscle Tone: within normal limits Gait & Station: normal Patient leans: N/A   Psychiatric Specialty Exam:  Presentation  General Appearance: Appropriate for Environment; Casual; Fairly Groomed  Eye Contact:Good  Speech:Clear and Coherent; Normal Rate  Speech Volume:Normal  Handedness:Right  Mood and Affect   Mood:Euthymic  Affect:Appropriate; Congruent  Thought Process  Thought Processes:Coherent; Goal Directed; Linear  Descriptions of Associations:Intact  Orientation:Full (Time, Place and Person)  Thought Content:Logical  History of Schizophrenia/Schizoaffective disorder:No  Duration of Psychotic Symptoms:No data recorded Hallucinations:No data recorded Ideas of Reference:None  Suicidal Thoughts:No data recorded Homicidal Thoughts:No data recorded  Sensorium  Memory:Immediate Good; Recent Fair; Remote Fair  Judgment:Fair  Insight:Good  Executive Functions  Concentration:Fair  Attention Span:Good  [place]  Language:Good  Psychomotor Activity  Psychomotor Activity: No data recorded  Assets  Assets:Communication Skills; Desire for Improvement; Financial Resources/Insurance; Housing; Resilience; Social Support; Transportation;  Vocational/Educational; Intimacy  Sleep  Sleep: No data recorded  Physical Exam: Physical Exam Vitals and nursing note reviewed.  Constitutional:      Appearance: Normal appearance.  HENT:     Head: Normocephalic.  Pulmonary:     Effort: Pulmonary effort is normal.  Musculoskeletal:        General: Normal range of motion.     Cervical back: Normal range of motion.  Neurological:     General: No focal deficit present.     Mental Status: She is alert and oriented to person, place, and time.  Psychiatric:        Attention and Perception: Attention normal. She does not perceive auditory or visual hallucinations.        Mood and Affect: Mood normal.        Speech: Speech normal.        Behavior: Behavior normal. Behavior is cooperative.        Thought Content: Thought content normal. Thought content does not include suicidal ideation. Thought content does not include suicidal plan.        Cognition and Memory: Cognition normal.   Review of Systems  Constitutional: Negative.  Negative for fever.  HENT:  Negative.  Negative for congestion and sore throat.   Respiratory: Negative.  Negative for cough and shortness of breath.   Cardiovascular: Negative.   Gastrointestinal: Negative.   Genitourinary: Negative.   Musculoskeletal: Negative.   Neurological: Negative.    Blood pressure 118/89, pulse 71, temperature 98 F (36.7 C), temperature source Oral, resp. rate 16, height '5\' 4"'$  (1.626 m), weight 59 kg, SpO2 99 %. Body mass index is 22.31 kg/m.   Social History   Tobacco Use  Smoking Status Never  Smokeless Tobacco Never   Tobacco Cessation:  N/A, patient does not currently use tobacco products   Blood Alcohol level:  Lab Results  Component Value Date   ETH <10 07/08/2021   ETH 199 (H) [id number]    Metabolic Disorder Labs:  Lab Results  Component Value Date   HGBA1C 5.2 01/19/2021   MPG 102.54 01/19/2021   No results found for: PROLACTIN Lab Results  Component Value Date   CHOL 300 (H) 01/19/2021   TRIG 41 01/19/2021   HDL 120 01/19/2021   CHOLHDL 2.5 01/19/2021   VLDL 8 01/19/2021   LDLCALC 172 (H) 01/19/2021    See Psychiatric Specialty Exam and Suicide Risk Assessment completed by Attending Physician prior to discharge.  Discharge destination:  Home  Is patient on multiple antipsychotic therapies at discharge:  No   Has Patient had three or more failed trials of antipsychotic monotherapy by history:  No  Recommended Plan for Multiple Antipsychotic Therapies: NA  Discharge Instructions     Diet - low sodium heart healthy   Complete by: As directed    Increase activity slowly   Complete by: As directed       Allergies as of 07/11/2021       Reactions   Latex Shortness Of Breath, Swelling, Rash        Medication List     STOP taking these medications    DayVigo 5 MG Tabs Generic drug: Lemborexant   ibuprofen 200 MG tablet Commonly known as: ADVIL   methylphenidate 18 MG CR tablet Commonly known as: CONCERTA       TAKE these  medications      Indication  antiseptic oral rinse Liqd 15 mLs by Mouth Rinse route as needed for dry mouth.  Indication: dry mouth   cetirizine 10 MG tablet Commonly known as: ZYRTEC Take 10 mg by mouth daily.  Indication: Hayfever   DULoxetine 30 MG capsule Commonly known as: CYMBALTA Take 3 capsules (90 mg total) by mouth daily. Start taking on: July 12, 2021 What changed:  how much to take additional instructions Another medication with the same name was removed. Continue taking this medication, and follow the directions you see here.  Indication: Major Depressive Disorder   estradiol-norethindrone 0.05-0.14 MG/DAY Commonly known as: [place] 1 patch onto the skin once a week.  Indication: Deficiency of the Hormone Estrogen   gabapentin 100 MG capsule Commonly known as: NEURONTIN Take 1 capsule (100 mg total) by mouth daily at 12 noon. Take 100 mg in the morning and 200 mg in the evening. Start taking on: July 12, 2021 What changed:  medication strength how much to take when to take this additional instructions  Indication: Abuse or Misuse of Alcohol   IRON PO Take 1 tablet by mouth daily.  Indication: Iron supplement   melatonin 5 MG Tabs Take 1 tablet (5 mg total) by mouth at bedtime.  Indication: Trouble Sleeping   multivitamin tablet Take 1 tablet by mouth daily.  Indication: Nutritional Support   polyvinyl alcohol 1.4 % ophthalmic solution Commonly known as: LIQUIFILM TEARS Place 1 drop into both eyes as needed for dry eyes.  Indication: dry eyes   thiamine 100 MG tablet Take 1 tablet (100 mg total) by mouth daily. Start taking on: July 12, 2021  Indication: Deficiency of Vitamin B1   VITAMIN B 12 PO Take 1 tablet by mouth daily.  Indication: B12 supplement        Follow-up Information     Group[facility]. Go on 07/27/2021.   Specialty: Behavioral Health Why: You have an appointment for medication management  services on 07/27/21 at 8:30 am.  You also have an appointment for therapy on 08/11/21 at 9:00 am. These appointments will be held in person. Contact information: [address] [place] [address] [phone]         [facility], [place] Follow up.   Why: You may go to this provider for interim therapy services, during walk in hours for new patients:  Monday through Friday, from 9:00 am to 3:00 pm. Contact information: [place] [address] [phone]                 Follow-up recommendations:  Activity:  as tolerated Diet:  Heart healthy  Comments:  Prescriptions were given at discharge.  Patient is agreeable with the discharge plan.  She was given an opportunity to ask questions.  She appears to feel comfortable with discharge and denies any current suicidal or homicidal thoughts.   Patient is instructed prior to discharge to: Take all medications as prescribed by her mental healthcare provider. Report any adverse effects and or reactions from the medicines to her outpatient provider promptly. Patient has been instructed & cautioned: To not engage in alcohol and or illegal drug use while on prescription medicines. In the event of worsening symptoms, patient is instructed to call the crisis hotline, 911 and or go to the nearest ED for appropriate evaluation and treatment of symptoms. To follow-up with her primary care provider for your other medical issues, concerns and or health care needs.   Signed: Ethelene Hal, NP 07/11/2021, 3:52 PM

## 2021-07-11 NOTE — Progress Notes (Signed)
  [facility] Adult Case Management Discharge Plan :  Will you be returning to the same living situation after discharge:  Yes,  will be returning home At discharge, do you have transportation home?: Yes,  daughter is to pick this pt up Do you have the ability to pay for your medications: Yes,  has insurance  Release of information consent forms completed and in the chart;  Patient's signature needed at discharge.  Patient to Follow up at:  Follow-up Information     [facility]. Go on 07/27/2021.   Specialty: Behavioral Health Why: You have an appointment for medication management services on 07/27/21 at 8:30 am.  You also have an appointment for therapy on 08/11/21 at 9:00 am. These appointments will be held in person. Contact information: [address] [place] [address] [phone]         [facility] [place] Follow up.   Why: You may go to this provider for interim therapy services, during walk in hours for new patients:  Monday through Friday, from 9:00 am to 3:00 pm. Contact information: [place] [address] [phone]                 Next level of care provider has access to [place] and Suicide Prevention discussed: Yes,  with significant other     Has patient been referred to the Quitline?: Patient refused referral  Patient has been referred for addiction treatment: Pt. refused referral  Vassie Moselle, LCSW 07/11/2021, 10:09 AM

## 2021-07-11 NOTE — BHH Suicide Risk Assessment (Signed)
[facility] Discharge Suicide Risk Assessment   Principal Problem: MDD (major depressive disorder), recurrent severe, without psychosis ([place]) Discharge Diagnoses: Principal Problem:   MDD (major depressive disorder), recurrent severe, without psychosis ([place]) Active Problems:   Alcohol abuse  Total Time Spent in Direct Patient Care:  I personally spent 30 minutes on the unit in direct patient care. The direct patient care time included face-to-face time with the patient, reviewing the patient's chart, communicating with other professionals, and coordinating care. Greater than 50% of this time was spent in counseling or coordinating care with the patient regarding goals of hospitalization, psycho-education, and discharge planning needs.  Subjective: Patient seen on rounds. She denies SI, HI, AVH, paranoia or delusions. She denies current cravings for alcohol or signs of withdrawal. She is interested in discharge today with plans to resume AA and outpatient psychotherapy and medication management and declines SAIOP or residential rehab. She voices no physical complaints and denies medication side-effects. She reports stable sleep and appetite and can articulate an aftercare and safety plan. Time was given for questions.  Musculoskeletal:within normal limits Strength & Muscle Tone: within normal limits Gait & Station: normal, steady Patient leans: N/A Psychiatric Specialty Exam: Physical Exam Vitals reviewed.  HENT:     Head: Normocephalic.  Pulmonary:     Effort: Pulmonary effort is normal.  Neurological:     General: No focal deficit present.     Mental Status: She is alert.    Review of Systems  Respiratory:  Negative for shortness of breath.   Cardiovascular:  Negative for chest pain.  Gastrointestinal:  Negative for constipation, diarrhea, nausea and vomiting.   Blood pressure (!) 101/49, pulse 87, temperature 98 F (36.7 C), temperature source Oral, resp. rate 16, height '5\' 4"'$  (1.626 m),  weight 59 kg, SpO2 99 %.Body mass index is 22.31 kg/m.  General Appearance:  casually dressed, adequate hygiene  Eye Contact:  Good  Speech:  Clear and Coherent and Normal Rate  Volume:  Normal  Mood: appears mildly anxious   Affect:   mildly constricted  and at times anxious  Thought Process:  Goal Directed and Linear with some ruminations  Orientation:  Full (Time, Place, and Person)  Thought Content:  Logical and no acute evidence of psychosis, paranoia, or delusions on exam; some ruminations about work stressors  Suicidal Thoughts:  No  Homicidal Thoughts:  No  Memory:  Recent;   Good  Judgement:  Fair  Insight:  Fair  Psychomotor Activity:  Normal  Concentration:  Concentration: Good and Attention Span: Good  Recall:  Good  Fund of Knowledge:  Good  Language:  Good  Akathisia:  Negative  Assets:  Communication Skills Desire for Improvement Housing Resilience Social Support Vocational/Educational  ADL's:  Intact  Cognition:  WNL  Sleep:  Number of Hours: 6.25   Mental Status Per Nursing Assessment::   On Admission:  Self-harm thoughts  Demographic Factors:  Living alone  Loss Factors: Decline in physical health  Historical Factors: Prior suicide attempts and relapse with alcohol prior to admission  Risk Reduction Factors:   Sense of responsibility to family, Employed, Positive social support, Positive therapeutic relationship, and Positive coping skills or problem solving skills  Continued Clinical Symptoms:  Depression:   Impulsivity Alcohol/Substance Abuse/Dependencies  Cognitive Features That Contribute To Risk:  None    Suicide Risk:  Mild:  Previous suicide attempt and alcohol use prior to admission. There are no identifiable plans, no associated intent, mild anxiety and related symptoms, few  other risk factors, and identifiable protective factors, including available and accessible social support.   Plan Of Care/Follow-up recommendations:   Activity:  as tolerated Diet:  heart healthy Other:  Patient advised to continue current medications and to keep outpatient mental health follow up appointments without fail. She was advised to abstain from alcohol and to restart AA after discharge.   Harlow Asa, MD, FAPA 07/11/2021, 8:17 AM

## 2021-07-11 NOTE — Progress Notes (Signed)
Recreation Therapy Notes  Date: 8.22.22 Time: 0930 Location: [address] Dayroom  Group Topic: Stress Management   Goal Area(s) Addresses:  Patient will actively participate in stress management techniques presented during session.  Patient will successfully identify benefit of practicing stress management post d/c.   Behavioral Response:  Appropriate  Intervention: Relaxation exercise with ambient sound and script   Activity: Guided Imagery. LRT provided education, instruction, and demonstration on practice of visualization via guided imagery. Patient was asked to participate in the technique introduced during session.  Patients were given suggestions of ways to access scripts post d/c and encouraged to explore Youtube and other apps available on smartphones, tablets, and computers.  Education:  Stress Management, Discharge Planning.   Education Outcome:  Acknowledges education  Clinical Observations/Feedback: Patient actively engaged in technique introduced, expressed no concerns and demonstrated ability to practice skill independently post d/c.      Victorino Sparrow, LRT/CTRS         Victorino Sparrow A 07/11/2021 12:05 PM

## 2021-07-11 NOTE — Progress Notes (Signed)
Debra Rhodes requested to speak after group and asked for prayer for help with her addiction.  She stated that she is scared to be going home and doesn't want to relapse.  Provided prayer for her and compassionate listening.  [address], [place] Pager, [phone]

## 2021-07-11 NOTE — [facility] Suicide Risk Assessment (Signed)
[place] INPATIENT:  Family/Significant Other Suicide Prevention Education  Suicide Prevention Education:  Education Completed; Debra Rhodes [phone]), has been identified by the patient as the family member/significant other with whom the patient will be residing, and identified as the person(s) who will aid the patient in the event of Debra mental health crisis (suicidal ideations/suicide attempt).  With written consent from the patient, the family member/significant other has been provided the following suicide prevention education, prior to the and/or following the discharge of the patient.    CSW spoke with this pt's significant other who stated that he has no safety concerns with her being discharged at this time. Pt is to stay with daughter for the night before returning home. He states that they have spoken about him staying with her or her staying with him when she feels that she needs extra support.    The suicide prevention education provided includes the following: Suicide risk factors Suicide prevention and interventions National Suicide Hotline telephone number [facility] assessment telephone number [facility] Emergency Assistance [place] and/or Residential Mobile Crisis Unit telephone number  Request made of family/significant other to: Remove weapons (e.g., guns, rifles, knives), all items previously/currently identified as safety concern.   Remove drugs/medications (over-the-counter, prescriptions, illicit drugs), all items previously/currently identified as Debra safety concern.  The family member/significant other verbalizes understanding of the suicide prevention education information provided.  The family member/significant other agrees to remove the items of safety concern listed above.  Debra Rhodes Debra Rhodes 07/11/2021, 10:01 AM

## 2021-07-11 NOTE — Progress Notes (Signed)
D:  Patient's self inventory sheet, patient has fair sleep, sleep medication helpful.  Good appetite, low energy level, poor concentration.  Rated depression and hopeless 4, anxiety 5.  Denied withdrawals.  Denied SI.  Denied physical problems.  Denied physical pain.  Goal is positive attitude and sobriety.  Plans to attend meeting, post discharge, positive self talk.  Does have discharge plans. A:  Medications administered per MD orders.  Emotional support and encouragement given patient. R:  Denied SI and HI, contracts for safety.  Denied A/V hallucinations.  Safety maintained with 15 minute checks.

## 2021-07-11 NOTE — Plan of Care (Signed)
Nurse discussed coping skills with patient.  

## 2021-07-11 NOTE — [facility] Group Notes (Signed)
LCSW Group Therapy Note   Type of Therapy and Topic:  Group Therapy: Anger and Coping Skills  Participation Level:  Active   Description of Group:   In this group, patients learned how to recognize the physical, cognitive, emotional, and behavioral responses they have to anger-provoking situations.  They identified how they usually or often react when angered, and learned how healthy and unhealthy coping skills work initially, but the unhealthy ones stop working.   They analyzed how their frequently-chosen coping skill is possibly beneficial and how it is possibly unhelpful.  The group discussed a variety of healthier coping skills that could help in resolving the actual issues, as well as how to go about planning for the the possibility of future similar situations.  Therapeutic Goals: Patients will identify one thing that makes them angry and how they feel emotionally and physically, what their thoughts are or tend to be in those situations, and what healthy or unhealthy coping mechanism they typically use Patients will identify how their coping technique works for them, as well as how it works against them. Patients will explore possible new behaviors to use in future anger situations. Patients will learn that anger itself is normal and cannot be eliminated, and that healthier coping skills can assist with resolving conflict rather than worsening situations.  Summary of Patient Progress:  Patient engaged appropriately in group. Patient shared that she has struggled with addiction and this has made her support system lose trust in her. Patient shared that it makes her angry when her family tries to control her decisions and the ways she lives her life.  Patient discussed writing a letter to her support system as a way to engage in constructive conversation rather than yell or lash out. Patient engaged with social worker about trying to look at supportive family's perspectives before getting angry.    Therapeutic Modalities:   Cognitive Behavioral Therapy Motivation Interviewing   Onyinyechi Huante, LCSW, [place] Social Worker  [facility]

## 2021-07-11 NOTE — Plan of Care (Addendum)
Discharge Note:  Patient discharged to go home with her daughter.  Suicide prevention information given and discussed with patient who stated she understands and had no questions.  Patient stated she appreciates all assistance received from [facility] staff.  Patient stated she received all her belongings, clothing, toiletries, etc.  All required discharge information given.

## 2021-07-12 DIAGNOSIS — D485 Neoplasm of uncertain behavior of skin: Secondary | ICD-10-CM | POA: Diagnosis not present

## 2021-07-13 ENCOUNTER — Other Ambulatory Visit: Payer: Self-pay

## 2021-07-13 ENCOUNTER — Ambulatory Visit (INDEPENDENT_AMBULATORY_CARE_PROVIDER_SITE_OTHER): Payer: BC Managed Care – PPO | Admitting: Psychiatry

## 2021-07-13 DIAGNOSIS — F32A Depression, unspecified: Secondary | ICD-10-CM

## 2021-07-13 NOTE — Progress Notes (Signed)
[facility] Counselor Initial Adult Exam  Name: Debra Rhodes Date: 07/13/2021 MRN: [id number] DOB: 03-Jun-1961 PCP: Maurice Small, MD  Time spent: 60 minutes  Guardian/Payee:  n/a    Paperwork requested:  No   Reason for Visit /Presenting Problem: anxiety, depression, alcoholism "use to cope" (currently sober in [place] with new sponsor)  Mental Status Exam:    Appearance:   Casual     Behavior:  Appropriate, Sharing, and Motivated  Motor:  Normal  Speech/Language:   Clear and Coherent  Affect:  Depressed and anxious  Mood:  anxious and depressed  Thought process:  goal directed  Thought content:    Some obsessiveness  Sensory/Perceptual disturbances:    WNL  Orientation:  oriented to person, place, time/date, situation, day of week, month of year, year, and stated date of Aug. 24, 2022  Attention:  Fair  Concentration:  Fair  Memory:  [place] of knowledge:   Good  Insight:    Good  Judgment:   Fair  Impulse Control:  Poor   Reported Symptoms:  see symptoms noted above  Risk Assessment: Danger to Self:  No Self-injurious Behavior: No Danger to Others: No Duty to Warn:no Physical Aggression / Violence:No  Access to Firearms a concern: No  Gang Involvement:No  Patient / guardian was educated about steps to take if suicide or homicide risk level increases between visits: Denies current SI but has had SI in the past and received inpatient and outpatient care While future psychiatric events cannot be accurately predicted, the patient does not currently require acute inpatient psychiatric care and does not currently meet [facility] involuntary commitment criteria.  Substance Abuse History: Current substance abuse:  "last drink within past week, drank wine but none since"     Past Psychiatric History:   Previous psychological history is significant for anxiety, depression, and alcohol abuse Outpatient Providers:has been seen previously in [place] and [place] History  of Psych Hospitalization: Yes  Psychological Testing:  n/a    Abuse History: Victim of No.,  n/a    Report needed: No. Victim of Neglect:No. Perpetrator of  No   Witness / Exposure to Domestic Violence: No   Protective Services Involvement: No  Witness to [employer] Violence:  No   Family History: Patient confirmed info below. 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 Service: No   Educational History: Education: Forensic psychologist, Agricultural consultant in [place]  Religion/Sprituality/World View:   Protestant  Any cultural differences that may affect / interfere with treatment:  not applicable   Recreation/Hobbies: hiking and being outdoors, has a dog she enjoys  Stressors:Financial difficulties Marital or family conflict Occupational concerns Substance abuse  Strengths:  Supportive Relationships, Family, Friends, Spirituality, Hopefulness, and Able to [employer]  Barriers: " myself, being dishonest";  has discussed this with her sponsor  Legal History: Pending legal issue / charges: The patient has no significant history of legal issues. 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 (Patient not taking: No sig reported) 237 mL 0   cetirizine (ZYRTEC) 10 MG tablet Take 10 mg by mouth daily.     Cyanocobalamin (VITAMIN B 12 PO) Take 1 tablet by mouth daily.     DULoxetine (CYMBALTA) 30 MG capsule Take 3 capsules (90 mg total) by mouth daily. 90 capsule 0   estradiol-norethindrone (COMBIPATCH) 0.05-0.14 MG/DAY Place 1 patch onto the skin once a week.     Ferrous Sulfate (IRON PO) Take 1 tablet by mouth daily.     gabapentin (NEURONTIN) 100 MG capsule Take 1 capsule (100 mg total) by mouth daily at 12 noon. Take 100 mg in the morning and 200 mg in the evening. 90 capsule 0   melatonin 5 MG TABS Take 1 tablet (5 mg total) by mouth at bedtime.  0   Multiple Vitamin (MULTIVITAMIN) tablet Take 1 tablet by mouth daily.     polyvinyl alcohol (LIQUIFILM TEARS) 1.4 % ophthalmic solution Place 1 drop into both eyes as needed for dry eyes.     thiamine 100 MG tablet Take 1 tablet (100 mg total) by mouth daily. 30 tablet 0   No current facility-administered medications for this visit.    Allergies  Allergen Reactions   Latex Shortness Of Breath, Swelling and Rash    Diagnoses:    ICD-10-CM   1. Depression, unspecified depression type  F32.A         Patient wants to "Decrease anxiety and depression, stay sober, find a less stressful job."   Treatment Plan: Patient not signing treatment plan on computer  screen due to [place]. Treatment Goals: Treatment goals remain on treatment plan while patient works with strategies to achieve her goals.  Progress is assessed each session and noted in the "progress" section of treatment note. Long term goal: Develop healthy cognitive patterns and beliefs about self in the world that lead to alleviation and help prevent the relapse of depressive and anxiety symptoms. Short term goal: Verbally express understanding the relationship between depressed mood and repression of feelings such as anger hurt and sadness. Strategies: Replace negative self-defeating self talk with verbalization of realistic and positive cognitive messages that do not feed anxiety nor depression.   Plan of Care:  Today is patient's first visit for therapy here and we completed her initial evaluation and treatment goal plan collaboratively.  Patient is a 60 yr old, divorced mother of 3 adult daughters, no grandchildren. Has relationship with BF off and on for 2 yrs.  Patient  is youngest of 7 children. Both parents are deceased. Older brother committed suicide in his 33's and was diagnosed with schizophrenia and alcoholism. Patient states she was not close to him. Patient presents after recent treatment episode after going to hospital with SI and intoxicated. Is out of treatment and to return to work this Friday. Recently got new sponsor. I tend to want positive attention all the time, need to be liked, gets hurt if she's not invited nor included (these are lifelong feeling). "I haven't been honest about some relationships including reaching out to people who are unhealthy for her."  She presents today casually and neatly dressed, behavior is appropriate and motivated, speech and language clear and coherent, mood and affect are depressed and anxious, thought process is goal directed, well oriented as to person/place/time and date/situation/day of week/month and year.  Her memory is within normal limits  per her report, attention and concentration are fair insight she feels is good judgment fair impulse control fluctuates between good/fair/poor, but more recently she has experienced some poor impulse control especially with choices involving alcohol.  She does not appear to be under the influence of alcohol or any substances today, there is no smell of alcohol and she denies any alcohol usage since her most recent discharge from treatment program.  She reports some financial stressors.  Support system involves friends and her 3 daughters.  Stressors include financial difficulties, family conflicts, substance abuse, her alcoholism and occupational concerns.  She states that her barrier to treatment is likely "myself and being dishonest" and states that she has shared this information with her new sponsor.  Her strengths she feels are supportive relationships some of her family, friends, her sense of spirituality and her ability to communicate effectively.  States that her main symptom today is depression followed closely by anxiety.  Patient's meds for behavioral health are managed by NP Thayer Headings, here at [facility].  Patient states that she is motivated for treatment and is tired of "being up and down and progress and lack of progress, and relapsing".  She wants to decrease her anxiety and depression without using alcohol, to stay sober, and find a less stressful job.  She appears motivated and she states she is motivated for change in a more positive direction.  Other details of initial evaluation can be found above.  Goals reviewed with patient and she is in agreement with them.    Next appointment within approximately 2 weeks.   Shanon Ace, LCSW

## 2021-07-25 IMAGING — MG DIGITAL SCREENING BREAST BILAT IMPLANT W/ TOMO W/ CAD
8 of 12 series · 8 of 28 positions shown · non-contrast
Comparison: Previous exam(s).

CLINICAL DATA: Screening.

EXAM:
DIGITAL SCREENING BILATERAL MAMMOGRAM WITH IMPLANTS, CAD AND
TOMOSYNTHESIS
TECHNIQUE: Bilateral screening digital craniocaudal and mediolateral oblique
mammograms were obtained. Bilateral screening digital breast
tomosynthesis was performed. The images were evaluated with
computer-aided detection. Standard and/or implant displaced views
were performed.

[R MLO]
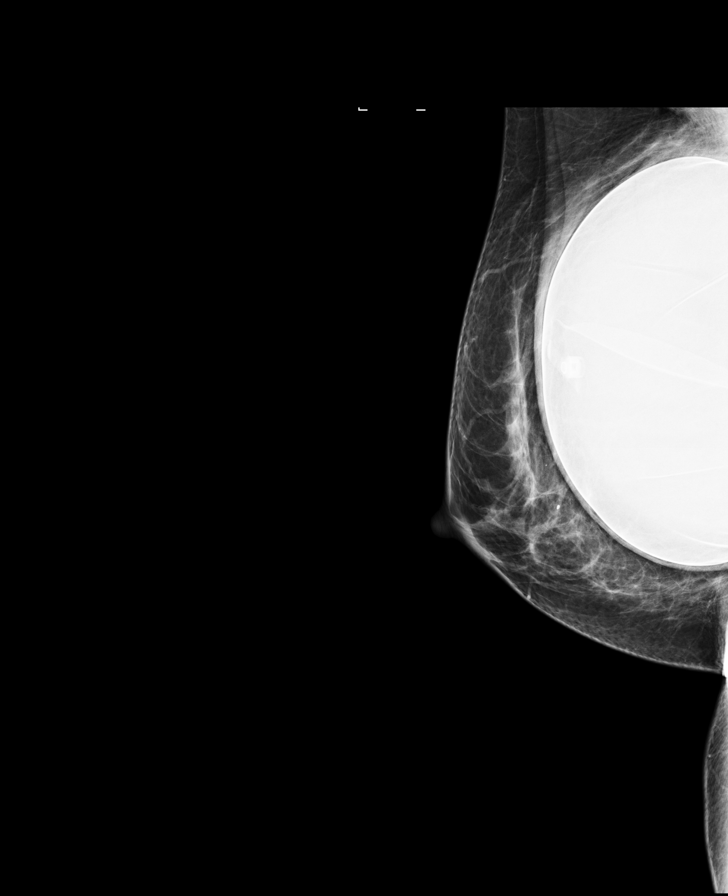

[L CC]
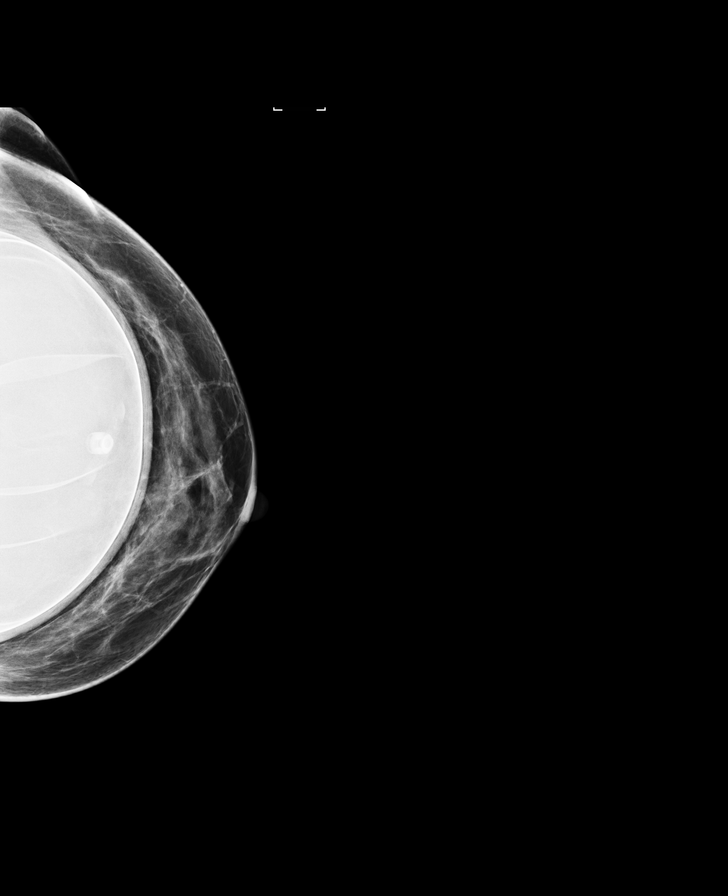

[R CC]
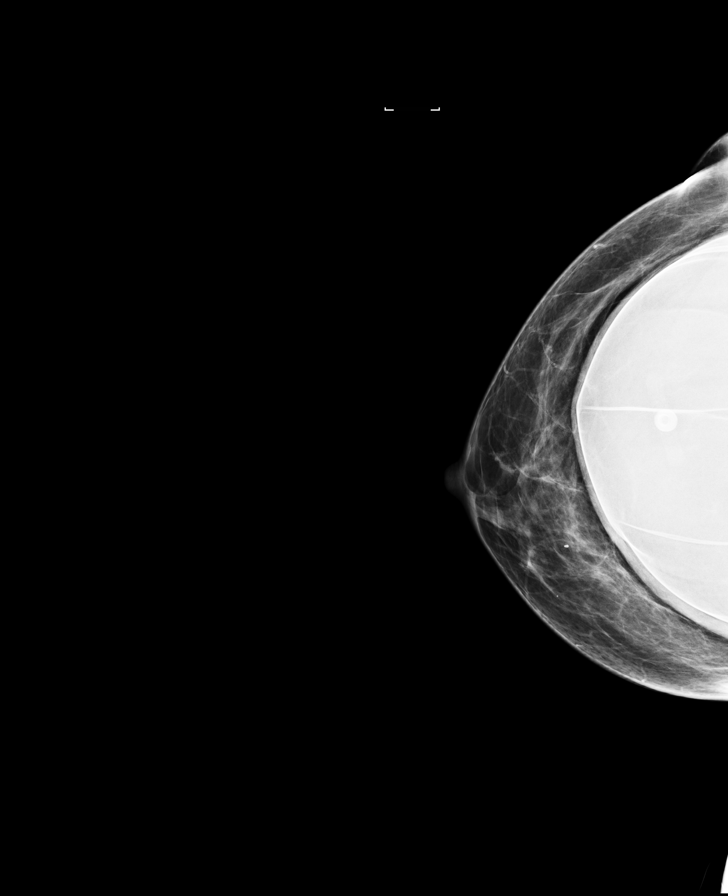

[L MLO]
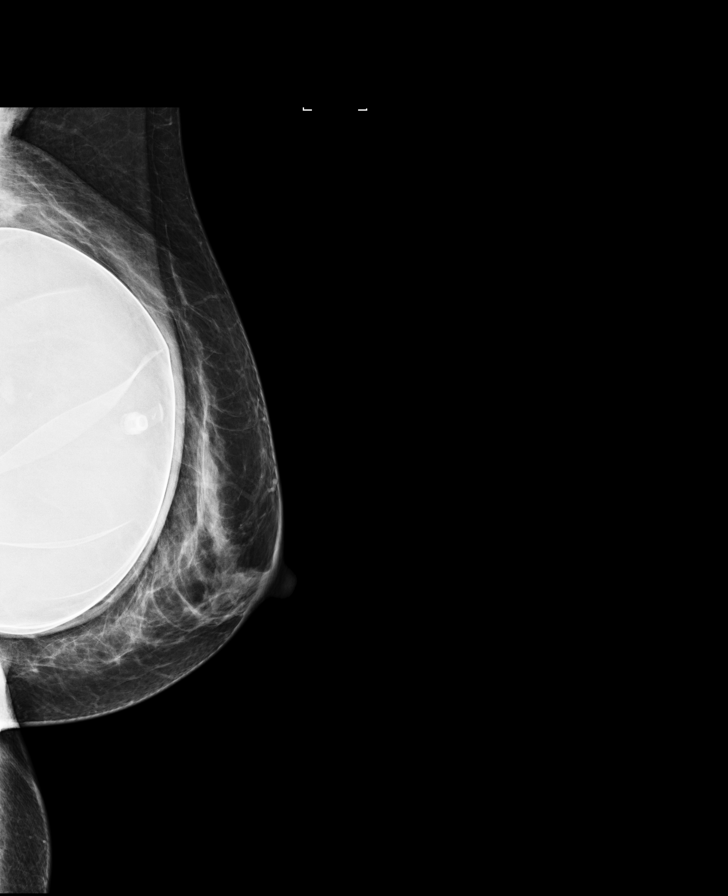

[L CC synth-2D]
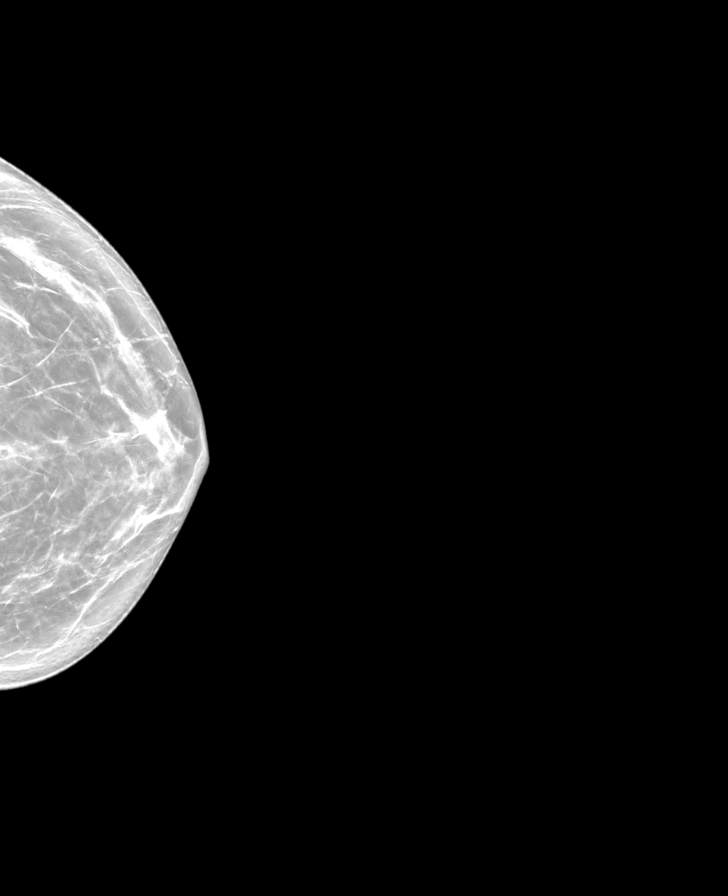

[R CC synth-2D]
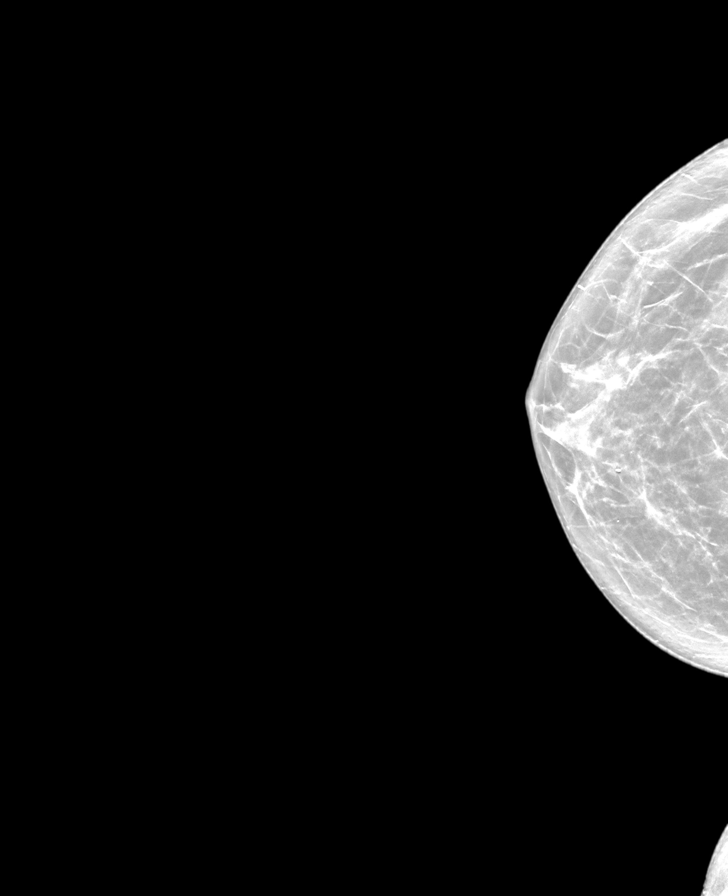

[R MLO synth-2D]
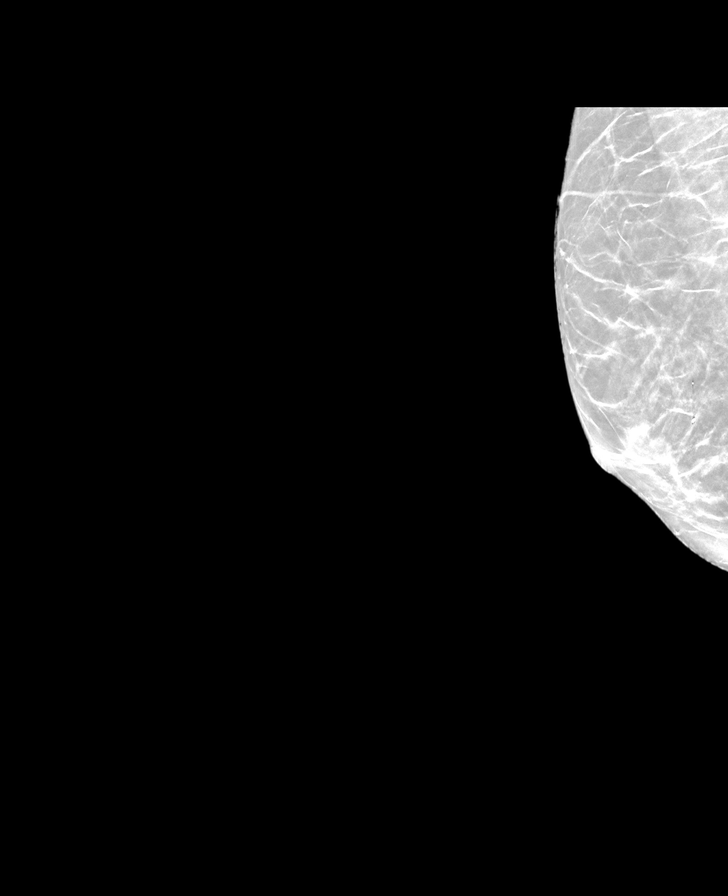

[L MLO synth-2D]
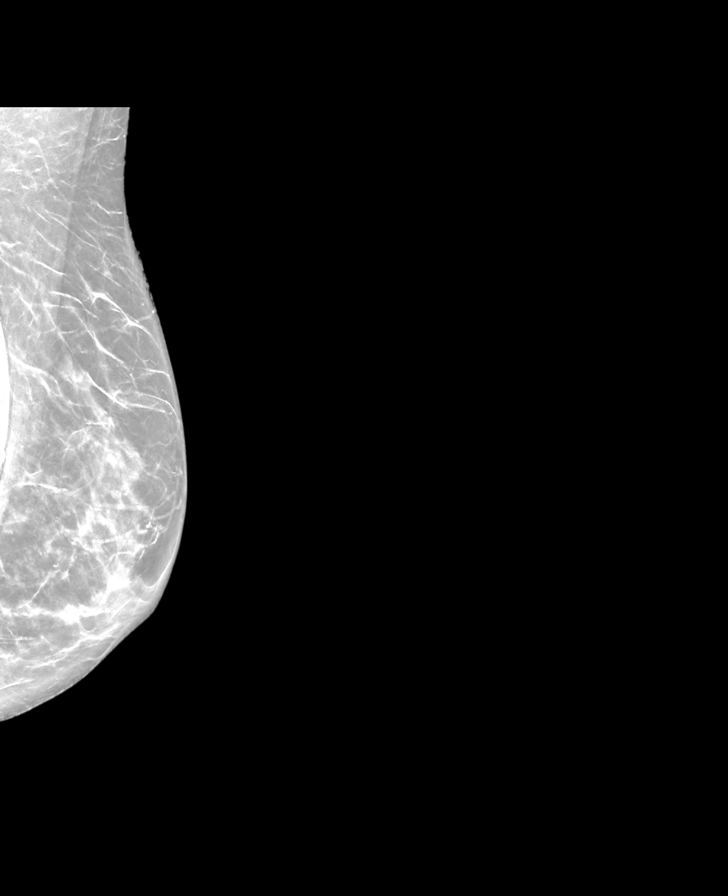

[8 of 28 positions shown; findings below may reference images not displayed]

ACR Breast Density Category c: The breast tissue is heterogeneously
dense, which may obscure small masses.
FINDINGS: The patient has retropectoral implants. There are no findings
suspicious for malignancy.
IMPRESSION: No mammographic evidence of malignancy. A result letter of this
screening mammogram will be mailed directly to the patient.

RECOMMENDATION:
Screening mammogram in one year. (Code:LT-E-7TH)

BI-RADS CATEGORY  1:  Negative.

## 2021-07-26 ENCOUNTER — Ambulatory Visit (INDEPENDENT_AMBULATORY_CARE_PROVIDER_SITE_OTHER): Payer: BC Managed Care – PPO | Admitting: Psychiatry

## 2021-07-26 ENCOUNTER — Other Ambulatory Visit: Payer: Self-pay

## 2021-07-26 DIAGNOSIS — F32A Depression, unspecified: Secondary | ICD-10-CM | POA: Diagnosis not present

## 2021-07-26 NOTE — Progress Notes (Signed)
[facility] Counselor/Therapist Progress Note  Patient ID: Debra Rhodes, MRN: [id number],    Date: 07/26/2021  Time Spent: 57 minutes   Treatment Type: Individual Therapy  Reported Symptoms: Depression (decreased with not drinking past 2 wks, anxiety  Mental Status Exam:  Appearance:   Neat     Behavior:  Appropriate, Sharing, and Motivated  Motor:  Normal  Speech/Language:   Clear and Coherent  Affect:  Depressed and anxious  Mood:  anxious and depressed  Thought process:  goal directed  Thought content:    Obsessions and overanalyzing/overthinking  Sensory/Perceptual disturbances:    WNL  Orientation:  oriented to person, place, time/date, situation, day of week, month of year, year, and stated date of Sept. 6, 2022  Attention:  Good  Concentration:  Fair  Memory:  Sometime I forget things but I think it's more menopausal; but my mother did have alzheimers  Fund of knowledge:   Good  Insight:    Good and Fair  Judgment:   Good and Fair  Impulse Control:  Good and Fair   Risk Assessment: Danger to Self:  No Self-injurious Behavior: No Danger to Others: No Duty to Warn:no Physical Aggression / Violence:No  Access to Firearms a concern: No  Gang Involvement:No   Subjective:  Patient in today reporting that she met her new AA sponsor and has not drank any since last appt. Feels good about that.  Job very stressful and I tend to get easily stressed.  Visited her 3 daughters in [place] this past weekend as they've been very concerned about her prior relapse with drinking. Daughters have wanted her to move to [place] so they "can help me stay sober" but I want to stay here in [place]. Is talking with sponsor daily. Wanting a less stressful job;currently having to travel during the week within [place] and [place].  "A very demanding schedule". Attending meeting AA daily. States the most trying thing the past couple weeks has been in contacts with my adult daughters, trying  to assure them I'm ok and serious about stopping drinking. Had drunk previously and didn't tell her former [place] sponsor, doesn't like having ask others for things. Discusses her history of relapsing in more detail and how she doesn't want to repeat that at this point in her life. Doesn't like to ask for help and we discussed the importance of her accepting her need for help at times and not to be judging herself on that. Processed some issues between her and daughters and how this relates to her history of alcohol abuse.Also shared more re: some occupational concerns/stressors. Some discussion her past history of being dishonest about her drinking and self-sabotage. "Terrified of relapsing ".Also emphasized her strengths and ability to remain sober.    Interventions: Solution-Oriented/Positive Psychology, Ego-Supportive, and Insight-Oriented  Diagnosis:   ICD-10-CM   1. Depression, unspecified depression type  F32.A       Treatment Plan: Patient not signing treatment plan on computer screen due to [place]. Treatment Goals: Treatment goals remain on treatment plan while patient works with strategies to achieve her goals.  Progress is assessed each session and noted in the "progress" section of treatment note. Long term goal: Develop healthy cognitive patterns and beliefs about self in the world that lead to alleviation and help prevent the relapse of depressive and anxiety symptoms. Short term goal: Verbally express understanding the relationship between depressed mood and repression of feelings such as anger hurt and sadness. Strategies:  Replace negative self-defeating self talk with verbalization of realistic and positive cognitive messages that do not feed anxiety nor depression.    Plan: Patient today showing good motivation and discussing some very sensitive issues about past relapses and her most current relapse.  Is "terrified of relapsing again".  Discussed some positives with her, some of  which she had not thought about, and also what she needs to do if she feels she is going to relapse.  "I hate to reach out for help".  We discussed her reaching out and how it is a positive for her and not a negative, it does not indicate weakness, and actually indicates strength which we processed at length today.  Encouraged patient to keep her promises to herself and her family, to remain in daily contact with her [place] sponsor, to attend AA meetings daily, practice consistent positive self talk, continue her exercising at the gym, stay in contact with supportive people, stay in the present focusing on what she can control or change versus cannot, healthy nutrition, looking more intentionally for the positives within herself daily, believing in herself, and feeling good about the strength she is showing as she works with goal-directed behaviors in trying to move forward in a more positive direction of sobriety and improved emotional health and stability.  Review and progress/challenges noted with patient.  Next appointment within 2 weeks.   Shanon Ace, LCSW

## 2021-07-27 ENCOUNTER — Ambulatory Visit (INDEPENDENT_AMBULATORY_CARE_PROVIDER_SITE_OTHER): Payer: BC Managed Care – PPO | Admitting: Psychiatry

## 2021-07-27 ENCOUNTER — Encounter: Payer: Self-pay | Admitting: Psychiatry

## 2021-07-27 DIAGNOSIS — F902 Attention-deficit hyperactivity disorder, combined type: Secondary | ICD-10-CM

## 2021-07-27 DIAGNOSIS — F411 Generalized anxiety disorder: Secondary | ICD-10-CM | POA: Diagnosis not present

## 2021-07-27 DIAGNOSIS — F32A Depression, unspecified: Secondary | ICD-10-CM

## 2021-07-27 DIAGNOSIS — G4700 Insomnia, unspecified: Secondary | ICD-10-CM | POA: Diagnosis not present

## 2021-07-27 MED ORDER — JORNAY PM 20 MG PO CP24: 20.0000 mg | ORAL_CAPSULE | Freq: Every evening | ORAL | 0 refills | Status: DC

## 2021-07-27 NOTE — Progress Notes (Signed)
Debra Rhodes [id number] 1961-11-15 60 y.o.  Subjective:   Patient ID:  Debra Rhodes is a 60 y.o. (DOB 1961/08/15) female.  Chief Complaint:  Chief Complaint  Patient presents with   Depression   Anxiety   ADHD    HPI Debra Rhodes presents to the office today for follow-up of anxiety, depression, ADHD, and h/o ETOH misuse. She is being seen today for hospital discharge follow-up. She reports that recent hospital discharge occurred after missing several doses of medication, having worsening mood and anxiety s/s and then relapsing on ETOH. She reports cycle of  "I forget to take my medication, then get depressed, then start drinking and become suicidal." She questions if this is a form of "self-sabotage." She reports that it is difficult for her to remember to take medications in the morning. She also questions if ADHD s/s interfere with taking medications consistently. Reports that she has been consistent with taking meds for the last 2 weeks since hospitalization.   She reports that Concerta had a short duration and denies taking it currently.   She reports that Gabapentin was changed to 100 mg daily during hospitalization and denies any other med changes.  Mood has been "up and down" after discharge. Mood remains somewhat low and that it is progressively improving. Reports anxiety with job. Notices some procrastination. She reports that concentration is poor on the days she works from home unless she takes Concerta and notices multiple distractions. Sleeping ok. Appetite has been good. Energy and motivation are improving. Denies SI.   She is trying to establish a routine and set schedule for herself since current job does not have as much structure.   Last ETOH use was 07/08/21. She noticed some ETOH cravings last week.   Has started therapy with Rinaldo Cloud, LCSW and has a new sponsor.   Often working 10 hours a day on average. Has some travel.   Past Psychiatric Medication  Trials: Paxil- Has taken it since 60 yo. Has had times where it has been less effective. Had wt gain and sexual side effects at higher doses (40 mg and 60 mg). Prozac- felt more anxious Sertraline Cymbalta- Started on 40 mg and ws increased to 60 mg. Wellbutrin- increased irritability Trazodone-helpful but causes some excessive somnolence, especially at 100 mg dose.  Remeron- Effective, wt gain Hydroxyzine Gabapentin- Takes as needed for anxiety and leg cramps. Occ word finding errors with more than 300 mg Buspar-Increased agitation and anxiety Concerta- Had increased anxiety Strattera- Effective but feels "wired" [facility] from 07/07/2021 in [facility] from 01/17/2021 in [facility]  C-SSRS RISK CATEGORY High Risk High Risk        Review of Systems:  Review of Systems  Cardiovascular:  Negative for palpitations.  Musculoskeletal:  Negative for gait problem.  Neurological:  Negative for tremors.  Psychiatric/Behavioral:         Please refer to HPI   Medications: I have reviewed the patient's current medications.  Current Outpatient Medications  Medication Sig Dispense Refill   cetirizine (ZYRTEC) 10 MG tablet Take 10 mg by mouth daily.     Cyanocobalamin (VITAMIN B 12 PO) Take 1 tablet by mouth daily.     DULoxetine (CYMBALTA) 30 MG capsule Take 3 capsules (90 mg total) by mouth daily. 90 capsule 0   estradiol-norethindrone (COMBIPATCH) 0.05-0.14 MG/DAY Place 1 patch onto the skin once a week.     Ferrous Sulfate (  IRON PO) Take 1 tablet by mouth daily.     gabapentin (NEURONTIN) 100 MG capsule Take 1 capsule (100 mg total) by mouth daily at 12 noon. Take 100 mg in the morning and 200 mg in the evening. 90 capsule 0   melatonin 5 MG TABS Take 1 tablet (5 mg total) by mouth at bedtime.  0   Methylphenidate HCl ER, PM, (JORNAY PM) 20 MG CP24 Take 20 mg by mouth every evening. 30  capsule 0   Multiple Vitamin (MULTIVITAMIN) tablet Take 1 tablet by mouth daily.     polyvinyl alcohol (LIQUIFILM TEARS) 1.4 % ophthalmic solution Place 1 drop into both eyes as needed for dry eyes.     thiamine 100 MG tablet Take 1 tablet (100 mg total) by mouth daily. 30 tablet 0   antiseptic oral rinse (BIOTENE) LIQD 15 mLs by Mouth Rinse route as needed for dry mouth. (Patient not taking: No sig reported) 237 mL 0   No current facility-administered medications for this visit.    Medication Side Effects: None  Allergies:  Allergies  Allergen Reactions   Latex Shortness Of Breath, Swelling and Rhodes    Past Medical History:  Diagnosis Date   Anxiety    Depression    PONV (postoperative nausea and vomiting)     Past Medical History, Surgical history, Social history, and Family history were reviewed and updated as appropriate.   Please see review of systems for further details on the patient's review from today.   Objective:   Physical Exam:  There were no vitals taken for this visit.  Physical Exam Constitutional:      General: She is not in acute distress. Musculoskeletal:        General: No deformity.  Neurological:     Mental Status: She is alert and oriented to person, place, and time.     Coordination: Coordination normal.  Psychiatric:        Attention and Perception: Attention and perception normal. She does not perceive auditory or visual hallucinations.        Mood and Affect: Mood normal. Mood is not anxious or depressed. Affect is not labile, blunt, angry or inappropriate.        Speech: Speech normal.        Behavior: Behavior normal.        Thought Content: Thought content normal. Thought content is not paranoid or delusional. Thought content does not include homicidal or suicidal ideation. Thought content does not include homicidal or suicidal plan.        Cognition and Memory: Cognition and memory normal.        Judgment: Judgment normal.     Comments:  Insight intact    Lab Review:     Component Value Date/Time   NA 137 07/07/2021 1716   K 4.2 07/07/2021 1716   CL 101 07/07/2021 1716   CO2 26 07/07/2021 1716   GLUCOSE 90 07/07/2021 1716   BUN 7 07/07/2021 1716   CREATININE 0.64 07/07/2021 1716   CALCIUM 9.3 07/07/2021 1716   PROT 6.8 01/22/2021 0648   ALBUMIN 4.1 01/22/2021 0648   AST 40 01/22/2021 0648   ALT 30 01/22/2021 0648   ALKPHOS 42 01/22/2021 0648   BILITOT 0.9 01/22/2021 0648   GFRNONAA >60 07/07/2021 1716   GFRAA >60 05/14/2019 0317       Component Value Date/Time   WBC 5.0 07/07/2021 1716   RBC 4.55 07/07/2021 1716   HGB 14.2 07/07/2021 1716  HCT 42.1 07/07/2021 1716   PLT 286 07/07/2021 1716   MCV 92.5 07/07/2021 1716   MCH 31.2 07/07/2021 1716   MCHC 33.7 07/07/2021 1716   RDW 13.0 07/07/2021 1716   LYMPHSABS 1.5 07/07/2021 1716   MONOABS 0.2 07/07/2021 1716   EOSABS 0.0 07/07/2021 1716   BASOSABS 0.0 07/07/2021 1716    No results found for: POCLITH, LITHIUM   No results found for: PHENYTOIN, PHENOBARB, VALPROATE, CBMZ   .res Assessment: Plan:    Patient seen for 30 minutes and time spent reviewing recent hospitalization and symptoms since hospital discharge.  Agree with plan to start regular therapy with Rinaldo Cloud, LCSW.  Discussed strategies to possibly prevent lapse in treatment, to include using reminders, having medication available with her during the day if she realizes she forgot to take it.  Discussed also considering strategies to better control attention deficit disorder since she reports that ADHD signs and symptoms may be a factor with missed medication doses.  Discussed potential benefits, risks, and side effects of Jornay PM and that this could be taken at night to increase compliance and medication would already be effective by the time she awakens and may help with taking other medications consistently.  Patient agrees to trial of Jornay PM.  Prescription sent for Jornay PM 20 mg in  the evening.  Discussed that prescription may require prior authorization. Encouraged patient to take smaller dose of Cymbalta, such as 30 mg or 60 mg, later in the day if she realizes she missed her morning dose since she reports sleep disturbance if she takes 90 mg later in the day.  Discussed that patient may also experience increased anxiety and depression with missed doses due to discontinuation signs and symptoms and taking partial dose as opposed to missing an entire dose, may help minimize risk of discontinuation signs and symptoms. Continue Cymbalta 90 mg daily for anxiety and depression. Continue gabapentin for anxiety and insomnia. Patient to follow-up in approximately 1 month or sooner if clinically indicated. Patient advised to contact office with any questions, adverse effects, or acute worsening in signs and symptoms.   Debra Rhodes was seen today for depression, anxiety and adhd.  Diagnoses and all orders for this visit:  Attention deficit hyperactivity disorder (ADHD), combined type -     Methylphenidate HCl ER, PM, (JORNAY PM) 20 MG CP24; Take 20 mg by mouth every evening.  Depression, unspecified depression type  Generalized anxiety disorder  Insomnia, unspecified type    Please see After Visit Summary for patient specific instructions.  Future Appointments  Date Time Provider [place]  08/11/2021  9:00 AM Shanon Ace, LCSW [facility] None  08/22/2021  8:00 AM Shanon Ace, LCSW [facility] None  08/25/2021  3:30 PM Thayer Headings, PMHNP CP-[facility] None  08/29/2021  8:00 AM Shanon Ace, LCSW CP[facility] None  09/05/2021  8:00 AM Shanon Ace, LCSW [facility] None  09/12/2021  8:00 AM Shanon Ace, LCSW [facility] None  09/14/2021  8:30 AM Thayer Headings, PMHNP [facility] None  09/19/2021  8:00 AM Shanon Ace, LCSW [facility] None    No orders of the defined types were placed in this encounter.   -------------------------------

## 2021-07-28 ENCOUNTER — Ambulatory Visit: Payer: BC Managed Care – PPO | Admitting: Psychiatry

## 2021-08-09 ENCOUNTER — Ambulatory Visit (INDEPENDENT_AMBULATORY_CARE_PROVIDER_SITE_OTHER): Payer: BC Managed Care – PPO | Admitting: Psychiatry

## 2021-08-09 ENCOUNTER — Other Ambulatory Visit: Payer: Self-pay

## 2021-08-09 DIAGNOSIS — F32A Depression, unspecified: Secondary | ICD-10-CM | POA: Diagnosis not present

## 2021-08-09 NOTE — Progress Notes (Signed)
[facility] Counselor/Therapist Progress Note  Patient ID: Debra Rhodes, MRN: [id number],    Date: 08/09/2021  Time Spent: 50 minutes   Treatment Type: Individual Therapy  Reported Symptoms: depression (decreasing some), anxiety, "got my 30 day chip at AA last night"  Mental Status Exam:  Appearance:   Casual and Neat     Behavior:  Appropriate, Sharing, and Motivated  Motor:  Normal  Speech/Language:   Clear and Coherent and Normal Rate  Affect:  Depressed and anxious  Mood:  anxious and depressed  Thought process:  goal directed  Thought content:    obsessiveness  Sensory/Perceptual disturbances:    WNL  Orientation:  oriented to person, place, time/date, situation, day of week, month of year, year, and stated date of Sept 20, 2022  Attention:  Fair  Concentration:  Fair  Memory:  [place] of knowledge:   Good  Insight:    Good and Fair  Judgment:   Good  Impulse Control:  Good and Fair   Risk Assessment: Danger to Self:  No Self-injurious Behavior: No Danger to Others: No Duty to Warn:no Physical Aggression / Violence:No  Access to Firearms a concern: No  Gang Involvement:No   Subjective:  Patient in today reporting anxiety and depression. Has not consumed any alcohol since last appt and picked up her 30 day chip at AA last night. Wanted to talk through her patterns of staying sober and then relapsing 3 times over past 4-5 yrs. "Each time life just gets overwhelming with anxiety and depression, and I end up drinking versus contacting sponsor."  "Alcoholism does run in my family, but not my parents." "Hard to ask for help from others as I interpret my struggles as insignificant." Processed these feelings in session and able to realize how her self-negating ends in self-sabotage with alcohol. Tends to minimize herself and feel "I'm not significant and don't need help of others but realizing I actually do need others but hard to admit." Patient discussing this in more  detail and sharing specific examples of how I frequently feel like a "piece of shit and I don't know why people want to be with me."  Overly sensitive re: making any mistakes. "Self-sabotaging and I get nervous if things go well for fear I may make a mistake and screw it up."  Processed this more with patient today and she is to practice more positive self-care and self-talk between sessions. Is getting better in recognizing her self-negating. To follow up on work in session today by practicing new ways of talking to herself and viewing herself between sessions.  To continue daily contact with sponsor and refraining from any use of alcohol.  Interrupt her negative self talk and replace with positive self talk.  Encouraged patient and her work today and emphasized her ability to make good choices and remain sober.   Interventions: Solution-Oriented/Positive Psychology, Ego-Supportive, and Insight-Oriented  Diagnosis:   ICD-10-CM   1. Depression, unspecified depression type  F32.A       Treatment Plan: Patient not signing treatment plan on computer screen due to [place]. Treatment Goals: Treatment goals remain on treatment plan while patient works with strategies to achieve her goals.  Progress is assessed each session and noted in the "progress" section of treatment note. Long term goal: Develop healthy cognitive patterns and beliefs about self in the world that lead to alleviation and help prevent the relapse of depressive and anxiety symptoms. Short term goal: Verbally express  understanding the relationship between depressed mood and repression of feelings such as anger hurt and sadness. Strategies: Replace negative self-defeating self talk with verbalization of realistic and positive cognitive messages that do not feed anxiety nor depression.     Plan: Patient today showing good motivation and engagement in session as she worked hard on issues about her use of alcohol over the years and  finally getting her 30-day chip last night at [employer].  Worked on some issues as noted above, that have held her back over the years, and more current things that tend to trip her up in the present, especially her negative view of herself and her negative self talk, and I believe that she is "insignificant".  Is concerned about relapsing again and we discussed this at length and helped her refocus on some ways of looking at herself and her situation that offer more hope for the future rather than as much fear of relapsing.  Encouraged patient to keep her promises to herself and her family, stay in daily contact with her [employer] sponsor, attend daily AA meetings, practice positive self talk consistently, practice good self-care, continue her exercising at the gym, staying in contact with supportive people, stay in the present focusing on what she can control or change versus cannot, saying no when she needs to say no, setting healthy boundaries with others as needed, and feeling good about the strength she shows as she works with goal-directed behaviors trying to move forward in a direction of sobriety and improved emotional health and stability.  Goal review and progress/challenges noted with patient.  Next appointment within 2 weeks.   Shanon Ace, LCSW

## 2021-08-11 ENCOUNTER — Ambulatory Visit: Payer: BC Managed Care – PPO | Admitting: Psychiatry

## 2021-08-12 ENCOUNTER — Other Ambulatory Visit: Payer: Self-pay

## 2021-08-12 MED ORDER — GABAPENTIN 100 MG PO CAPS: ORAL_CAPSULE | ORAL | 2 refills | Status: DC

## 2021-08-15 ENCOUNTER — Telehealth: Payer: Self-pay

## 2021-08-15 NOTE — Telephone Encounter (Signed)
Prior Authorization submitted and approved for JORNAY PM ER 20 MG effective 08/08/2021-08/08/2022 with [place]

## 2021-08-16 DIAGNOSIS — R0981 Nasal congestion: Secondary | ICD-10-CM | POA: Insufficient documentation

## 2021-08-16 DIAGNOSIS — J343 Hypertrophy of nasal turbinates: Secondary | ICD-10-CM | POA: Diagnosis not present

## 2021-08-16 DIAGNOSIS — J342 Deviated nasal septum: Secondary | ICD-10-CM | POA: Diagnosis not present

## 2021-08-22 ENCOUNTER — Ambulatory Visit (INDEPENDENT_AMBULATORY_CARE_PROVIDER_SITE_OTHER): Payer: BC Managed Care – PPO | Admitting: Psychiatry

## 2021-08-22 ENCOUNTER — Other Ambulatory Visit: Payer: Self-pay

## 2021-08-22 DIAGNOSIS — F411 Generalized anxiety disorder: Secondary | ICD-10-CM

## 2021-08-22 NOTE — Progress Notes (Signed)
[facility] Counselor/Therapist Progress Note  Patient ID: AVERY EUSTICE, MRN: [id number],    Date: 08/22/2021  Time Spent: 58 minutes   Treatment Type: Individual Therapy  Reported Symptoms: anxiety is reported as main symptom, some depression, denies any SI  Mental Status Exam:  Appearance:   Casual and Neat     Behavior:  Appropriate, Sharing, and Motivated  Motor:  Normal  Speech/Language:   Clear and Coherent  Affect:  Depressed and anxious  Mood:  anxious and depressed  Thought process:  goal directed  Thought content:    WNL  Sensory/Perceptual disturbances:    WNL  Orientation:  oriented to person, place, time/date, situation, day of week, month of year, year, and stated date of Oct. 3, 2022  Attention:  Fair  Concentration:  Fair  Memory:  [place] of knowledge:   Good  Insight:    Good  Judgment:   Good  Impulse Control:  Fair   Risk Assessment: Danger to Self:  No Self-injurious Behavior: No Danger to Others: No Duty to Warn:no Physical Aggression / Violence:No  Access to Firearms a concern: No  Gang Involvement:No   Subjective:  Patient in today reporting anxiety as main symptom, along some overwhelmedness, and depression. Staying in touch with sponsor daily. No drinking at all since last appt. "Have been able to resist drinking even when it's right in front of me in public." Shared her challenges since last appt, especially in relationships, and "I don't navigate relationships well." "I struggle with communication with others including her SO and others, who can be disrespectful."  "I end up losing out because I think there's some shame, and definitely a lot of guilt involved which we worked on more at length today. Doesn't want to repeat past scenarios of relapsing and showing good determination so far. Sees that her communication issues and some of her guilt are connected. Worked today more specifically on active listening skills and "paying attention to  how I say what I say", as "I have problems in both areas." To focus on and practice in between sessions the communication strategies discussed today.    Interventions: Ego-Supportive and Insight-Oriented  Diagnosis:   ICD-10-CM   1. Generalized anxiety disorder  F41.1       Treatment Plan: Patient not signing treatment plan on computer screen due to [place]. Treatment Goals: Treatment goals remain on treatment plan while patient works with strategies to achieve her goals.  Progress is assessed each session and noted in the "progress" section of treatment note. Long term goal: Develop healthy cognitive patterns and beliefs about self in the world that lead to alleviation and help prevent the relapse of depressive and anxiety symptoms. Short term goal: Verbally express understanding the relationship between depressed mood and repression of feelings such as anger hurt and sadness. Strategies: Replace negative self-defeating self talk with verbalization of realistic and positive cognitive messages that do not feed anxiety nor depression.    Plan: Patient today showing good motivation and participation in session as she worked hard on issues regarding her alcoholism and communication issues especially in relationships as noted above.  Insight was especially good today and she was able to more thoroughly verbalize some of her thoughts and feelings about her past, and also about her current life and what she hopes for the future.  Has successfully refrain from any alcohol use since last appointment.  Continuing to have daily contact with her sponsor and attend AA  meetings.  Not quite as fearful of relapsing but it is certainly still a concern and she is able to talk through those concerns today.  Encouraged her to keep the promises she has made to herself and her family regarding not drinking, to stay in daily contact with [place] sponsor, attend daily AA meetings, set limits with others as she needs to  including significant other, positive self talk more consistently, practice good self-care, continue her exercising at the gym, staying in contact with supportive people, stay in the present focusing on what she can control or change versus cannot, saying no when she needs to say no, and feeling positive about the strength she shows as she works with goal-directed behaviors to move in a direction that supports improved emotional health and sobriety.  Goal review and progress/challenges noted with patient.  Next appointment within 2 weeks.   Shanon Ace, LCSW

## 2021-08-25 ENCOUNTER — Ambulatory Visit (INDEPENDENT_AMBULATORY_CARE_PROVIDER_SITE_OTHER): Payer: BC Managed Care – PPO | Admitting: Psychiatry

## 2021-08-25 ENCOUNTER — Other Ambulatory Visit: Payer: Self-pay

## 2021-08-25 ENCOUNTER — Encounter: Payer: Self-pay | Admitting: Psychiatry

## 2021-08-25 DIAGNOSIS — F411 Generalized anxiety disorder: Secondary | ICD-10-CM | POA: Diagnosis not present

## 2021-08-25 DIAGNOSIS — F32A Depression, unspecified: Secondary | ICD-10-CM | POA: Diagnosis not present

## 2021-08-25 DIAGNOSIS — F902 Attention-deficit hyperactivity disorder, combined type: Secondary | ICD-10-CM | POA: Diagnosis not present

## 2021-08-25 MED ORDER — JORNAY PM 20 MG PO CP24: 20.0000 mg | ORAL_CAPSULE | Freq: Every evening | ORAL | 0 refills | Status: DC

## 2021-08-25 MED ORDER — DULOXETINE HCL 60 MG PO CPEP: 60.0000 mg | ORAL_CAPSULE | Freq: Every day | ORAL | 0 refills | Status: DC

## 2021-08-25 MED ORDER — DULOXETINE HCL 30 MG PO CPEP: 30.0000 mg | ORAL_CAPSULE | Freq: Every day | ORAL | 0 refills | Status: DC

## 2021-08-25 NOTE — Progress Notes (Signed)
Debra Rhodes [id number] [birth year] 60 y.o.  Subjective:   Patient ID:  Debra Rhodes is a 60 y.o. (DOB May 13, 1961) female.  Chief Complaint:  Chief Complaint  Patient presents with   Follow-up    ADD, anxiety, depression, insomnia    HPI Debra Rhodes presents to the office today for follow-up of anxiety, depression, ADD, and insomnia. She reports that she has been "much better." She reports that she is maintaining sobriety. She reports that her depression is "better." Occasional sad mood and no longer persistent. She reports that she has some anxiety "but I am able to work through it better." She reports some worry and social anxiety. Denies panic attacks. Appetite has been good and reports that she has been stress eating. Sleep has improved. She reports that motivation is ok, particularly at the start of the week. She reports that energy is normal. Concentration is "about the same." She has not been able to start [place] PM yet and PA was recently approved. She reports occasional fleeting passive death wishes. Denies SI.   Has been taking Gabapentin 100 m po BID and this helps with anxiety at the start of her day and helps with sleep.   She reports work stress.   Seeing Rinaldo Cloud, LCSW for therapy.   Going on vacation to see sister in [place].   Past Psychiatric Medication Trials: Paxil- Has taken it since 60 yo. Has had times where it has been less effective. Had wt gain and sexual side effects at higher doses (40 mg and 60 mg). Prozac- felt more anxious Sertraline Cymbalta- Started on 40 mg and ws increased to 60 mg. Wellbutrin- increased irritability Trazodone-helpful but causes some excessive somnolence, especially at 100 mg dose.  Remeron- Effective, wt gain Hydroxyzine Gabapentin- Takes as needed for anxiety and leg cramps. Occ word finding errors with more than 300 mg Buspar-Increased agitation and anxiety Concerta- Had increased anxiety Strattera-  Effective but feels "wired" [facility] from 07/07/2021 in [facility] ED from 01/17/2021 in [facility]  C-SSRS RISK CATEGORY High Risk High Risk        Review of Systems:  Review of Systems  Cardiovascular:  Negative for palpitations.  Musculoskeletal:  Negative for gait problem.  Neurological:  Negative for headaches.  Psychiatric/Behavioral:         Please refer to HPI   Medications: I have reviewed the patient's current medications.  Current Outpatient Medications  Medication Sig Dispense Refill   cetirizine (ZYRTEC) 10 MG tablet Take 10 mg by mouth daily.     Cyanocobalamin (VITAMIN B 12 PO) Take 1 tablet by mouth daily.     estradiol-norethindrone (COMBIPATCH) 0.05-0.14 MG/DAY Place 1 patch onto the skin once a week.     Ferrous Sulfate (IRON PO) Take 1 tablet by mouth daily.     gabapentin (NEURONTIN) 100 MG capsule Take 100 mg in the morning and 100 mg at noon, then take (2) capsules 200 mg in the evening. 90 capsule 2   Multiple Vitamin (MULTIVITAMIN) tablet Take 1 tablet by mouth daily.     thiamine 100 MG tablet Take 1 tablet (100 mg total) by mouth daily. 30 tablet 0   antiseptic oral rinse (BIOTENE) LIQD 15 mLs by Mouth Rinse route as needed for dry mouth. (Patient not taking: No sig reported) 237 mL 0   DULoxetine (CYMBALTA) 30 MG capsule Take 1 capsule (30 mg total) by mouth daily. Take  with a 60 mg capsule to equal total daily dose of 90 mg 90 capsule 0   DULoxetine (CYMBALTA) 60 MG capsule Take 1 capsule (60 mg total) by mouth daily. Take with a 30 mg capsule to equal total daily dose of 90 mg. 90 capsule 0   [START ON 09/22/2021] Methylphenidate HCl ER, PM, (JORNAY PM) 20 MG CP24 Take 20 mg by mouth every evening. 30 capsule 0   polyvinyl alcohol (LIQUIFILM TEARS) 1.4 % ophthalmic solution Place 1 drop into both eyes as needed for dry eyes.     No current facility-administered  medications for this visit.    Medication Side Effects: Other: ? Wt gain?  Allergies:  Allergies  Allergen Reactions   Latex Shortness Of Breath, Swelling and Rash    Past Medical History:  Diagnosis Date   Anxiety    Depression    PONV (postoperative nausea and vomiting)     Past Medical History, Surgical history, Social history, and Family history were reviewed and updated as appropriate.   Please see review of systems for further details on the patient's review from today.   Objective:   Physical Exam:  BP 130/80   Pulse 67   Physical Exam Constitutional:      General: She is not in acute distress. Musculoskeletal:        General: No deformity.  Neurological:     Mental Status: She is alert and oriented to person, place, and time.     Coordination: Coordination normal.  Psychiatric:        Attention and Perception: Attention and perception normal. She does not perceive auditory or visual hallucinations.        Mood and Affect: Mood normal. Mood is not anxious or depressed. Affect is not labile, blunt, angry or inappropriate.        Speech: Speech normal.        Behavior: Behavior normal.        Thought Content: Thought content normal. Thought content is not paranoid or delusional. Thought content does not include homicidal or suicidal ideation. Thought content does not include homicidal or suicidal plan.        Cognition and Memory: Cognition and memory normal.        Judgment: Judgment normal.     Comments: Insight intact    Lab Review:     Component Value Date/Time   NA 137 07/07/2021 1716   K 4.2 07/07/2021 1716   CL 101 07/07/2021 1716   CO2 26 07/07/2021 1716   GLUCOSE 90 07/07/2021 1716   BUN 7 07/07/2021 1716   CREATININE 0.64 07/07/2021 1716   CALCIUM 9.3 07/07/2021 1716   PROT 6.8 01/22/2021 0648   ALBUMIN 4.1 01/22/2021 0648   AST 40 01/22/2021 0648   ALT 30 01/22/2021 0648   ALKPHOS 42 01/22/2021 0648   BILITOT 0.9 01/22/2021 0648    GFRNONAA >60 07/07/2021 1716   GFRAA >60 05/14/2019 0317       Component Value Date/Time   WBC 5.0 07/07/2021 1716   RBC 4.55 07/07/2021 1716   HGB 14.2 07/07/2021 1716   HCT 42.1 07/07/2021 1716   PLT 286 07/07/2021 1716   MCV 92.5 07/07/2021 1716   MCH 31.2 07/07/2021 1716   MCHC 33.7 07/07/2021 1716   RDW 13.0 07/07/2021 1716   LYMPHSABS 1.5 07/07/2021 1716   MONOABS 0.2 07/07/2021 1716   EOSABS 0.0 07/07/2021 1716   BASOSABS 0.0 07/07/2021 1716    No results found for:  POCLITH, LITHIUM   No results found for: PHENYTOIN, PHENOBARB, VALPROATE, CBMZ   .res Assessment: Plan:    Discussed that [place] PM was approved by insurance and that she should now be able to fill it. She plans to start [place] PM 20 mg and reviewed when to take [place] PM and that script was sent for low dose and that further titration of dose may be needed based upon response and tolerability.  Continue Cymbalta 90 mg po qd for anxiety and depression.  Continue Gabapentin for anxiety and sleep disturbance. She reports that she does not need a script at this time.  Recommend continuing therapy with Rinaldo Cloud, LCSW.  Pt to follow-up in 6 weeks or sooner if clinically indicated.  Patient advised to contact office with any questions, adverse effects, or acute worsening in signs and symptoms.   Cailynn was seen today for follow-up.  Diagnoses and all orders for this visit:  Generalized anxiety disorder -     DULoxetine (CYMBALTA) 30 MG capsule; Take 1 capsule (30 mg total) by mouth daily. Take with a 60 mg capsule to equal total daily dose of 90 mg -     DULoxetine (CYMBALTA) 60 MG capsule; Take 1 capsule (60 mg total) by mouth daily. Take with a 30 mg capsule to equal total daily dose of 90 mg.  Depression, unspecified depression type -     DULoxetine (CYMBALTA) 30 MG capsule; Take 1 capsule (30 mg total) by mouth daily. Take with a 60 mg capsule to equal total daily dose of 90 mg -     DULoxetine  (CYMBALTA) 60 MG capsule; Take 1 capsule (60 mg total) by mouth daily. Take with a 30 mg capsule to equal total daily dose of 90 mg.  Attention deficit hyperactivity disorder (ADHD), combined type -     Methylphenidate HCl ER, PM, (JORNAY PM) 20 MG CP24; Take 20 mg by mouth every evening.    Please see After Visit Summary for patient specific instructions.  Future Appointments  Date Time Provider [place]  09/05/2021  8:00 AM Shanon Ace, LCSW [facility] None  09/12/2021  8:00 AM Shanon Ace, LCSW [facility] None  09/14/2021  8:30 AM Thayer Headings, PMHNP [facility] None  09/19/2021  8:00 AM Shanon Ace, LCSW [facility] None  09/27/2021  8:00 AM Jeanie Sewer, NP [facility]  10/04/2021  8:00 AM Shanon Ace, LCSW [facility] None  10/06/2021  8:30 AM Thayer Headings, PMHNP [facility] None  10/18/2021  8:00 AM Shanon Ace, LCSW [facility] None    No orders of the defined types were placed in this encounter.   -------------------------------

## 2021-08-29 ENCOUNTER — Ambulatory Visit: Payer: BC Managed Care – PPO | Admitting: Psychiatry

## 2021-09-05 ENCOUNTER — Ambulatory Visit: Payer: BC Managed Care – PPO | Admitting: Psychiatry

## 2021-09-12 ENCOUNTER — Ambulatory Visit: Payer: BC Managed Care – PPO | Admitting: Psychiatry

## 2021-09-12 NOTE — Progress Notes (Deleted)
[facility] Counselor/Therapist Progress Note  Patient ID: RAYLEA ADCOX, MRN: [id number],    Date: 09/12/2021  Time Spent: ***   Treatment Type: {CHL AMB THERAPY [phone]}  Reported Symptoms: ***  Mental Status Exam:  Appearance:   {PSY:22683}     Behavior:  {PSY:21022743}  Motor:  {PSY:22302}  Speech/Language:   {PSY:22685}  Affect:  {PSY:22687}  Mood:  {PSY:31886}  Thought process:  {PSY:31888}  Thought content:    {[phone]}  Sensory/Perceptual disturbances:    {PSY:206-844-3606}  Orientation:  {PSY:30297}  Attention:  {PSY:22877}  Concentration:  {PSY:331-069-8694}  Memory:  {PSY:[id number]}  Fund of knowledge:   {PSY:331-069-8694}  Insight:    {PSY:331-069-8694}  Judgment:   {PSY:331-069-8694}  Impulse Control:  {PSY:[id number]}   Risk Assessment: Danger to Self:  {PSY:22692} Self-injurious Behavior: {PSY:22692} Danger to Others: {PSY:22692} Duty to Warn:{PSY:311194} Physical Aggression / Violence:{PSY:21197} Access to Firearms a concern: {PSY:21197} Gang Involvement:{PSY:21197}  Subjective: ***Patient in today reporting    Patient in today reporting anxiety as main symptom, along some overwhelmedness, and depression. Staying in touch with sponsor daily. No drinking at all since last appt. "Have been able to resist drinking even when it's right in front of me in public." Shared her challenges since last appt, especially in relationships, and "I don't navigate relationships well." "I struggle with communication with others including her SO and others, who can be disrespectful."  "I end up losing out because I think there's some shame, and definitely a lot of guilt involved which we worked on more at length today. Doesn't want to repeat past scenarios of relapsing and showing good determination so far. Sees that her communication issues and some of her guilt are connected. Worked today more specifically on active listening skills and "paying attention to how  I say what I say", as "I have problems in both areas." To focus on and practice in between sessions the communication strategies discussed today.   Interventions: {[phone]}  Diagnosis:No diagnosis found.  Treatment Plan: Patient not signing treatment plan on computer screen due to [place]. Treatment Goals: Treatment goals remain on treatment plan while patient works with strategies to achieve her goals.  Progress is assessed each session and noted in the "progress" section of treatment note. Long term goal: Develop healthy cognitive patterns and beliefs about self in the world that lead to alleviation and help prevent the relapse of depressive and anxiety symptoms. Short term goal: Verbally express understanding the relationship between depressed mood and repression of feelings such as anger hurt and sadness. Strategies: Replace negative self-defeating self talk with verbalization of realistic and positive cognitive messages that do not feed anxiety nor depression.     Plan: ***Patient today showing    Patient today showing good motivation and participation in session as she worked hard on issues regarding her alcoholism and communication issues especially in relationships as noted above.  Insight was especially good today and she was able to more thoroughly verbalize some of her thoughts and feelings about her past, and also about her current life and what she hopes for the future.  Has successfully refrain from any alcohol use since last appointment.  Continuing to have daily contact with her sponsor and attend [place] meetings.  Not quite as fearful of relapsing but it is certainly still a concern and she is able to talk through those concerns today.  Encouraged her to keep the promises she has made to herself and her family regarding not drinking, to stay in daily  contact with [place] sponsor, attend daily AA meetings, set limits with others as she needs to including significant other, positive  self talk more consistently, practice good self-care, continue her exercising at the gym, staying in contact with supportive people, stay in the present focusing on what she can control or change versus cannot, saying no when she needs to say no, and feeling positive about the strength she shows as she works with goal-directed behaviors to move in a direction that supports improved emotional health and sobriety.     Goal review and progress/challenges noted with patient.  Next appointment within 2 weeks.  This record has been created using [employer].  Chart creation errors have been sought, but may not always have been located and corrected.  Such creation errors do not reflect on the standard of medical care provided.    Shanon Ace, LCSW

## 2021-09-14 ENCOUNTER — Ambulatory Visit: Payer: BC Managed Care – PPO | Admitting: Psychiatry

## 2021-09-14 ENCOUNTER — Telehealth: Payer: Self-pay | Admitting: Psychiatry

## 2021-09-14 DIAGNOSIS — N951 Menopausal and female climacteric states: Secondary | ICD-10-CM | POA: Diagnosis not present

## 2021-09-14 DIAGNOSIS — Z01419 Encounter for gynecological examination (general) (routine) without abnormal findings: Secondary | ICD-10-CM | POA: Diagnosis not present

## 2021-09-14 DIAGNOSIS — Z124 Encounter for screening for malignant neoplasm of cervix: Secondary | ICD-10-CM | POA: Diagnosis not present

## 2021-09-14 DIAGNOSIS — Z6824 Body mass index (BMI) 24.0-24.9, adult: Secondary | ICD-10-CM | POA: Diagnosis not present

## 2021-09-14 DIAGNOSIS — L659 Nonscarring hair loss, unspecified: Secondary | ICD-10-CM | POA: Diagnosis not present

## 2021-09-14 NOTE — Telephone Encounter (Signed)
Debra Rhodes is returning your call. She does plan to be at the appt Monday Oct 31st.

## 2021-09-15 DIAGNOSIS — H40013 Open angle with borderline findings, low risk, bilateral: Secondary | ICD-10-CM | POA: Diagnosis not present

## 2021-09-15 DIAGNOSIS — H2513 Age-related nuclear cataract, bilateral: Secondary | ICD-10-CM | POA: Diagnosis not present

## 2021-09-19 ENCOUNTER — Ambulatory Visit (INDEPENDENT_AMBULATORY_CARE_PROVIDER_SITE_OTHER): Payer: BC Managed Care – PPO | Admitting: Psychiatry

## 2021-09-19 ENCOUNTER — Other Ambulatory Visit: Payer: Self-pay

## 2021-09-19 DIAGNOSIS — F411 Generalized anxiety disorder: Secondary | ICD-10-CM

## 2021-09-19 NOTE — Progress Notes (Signed)
[facility] Counselor/Therapist Progress Note  Patient ID: THRESEA DOBLE, MRN: [id number],    Date: 09/19/2021  Time Spent: 57 minutes  Treatment Type: Individual Therapy  Reported Symptoms: anxiety, some depression "at times it's short-term but really bad when it hits and I'm in a dark place with some suicidal ideation saying to myself life is too much for me and I can't do this, but has not harmed myself". States is happens more in darker months or on darker days. Has struggled with this off and on most of my life. States she has been remaining alcohol-free ("sobriety date is Aug. 19th").  Mental Status Exam:  Appearance:   Neat     Behavior:  Appropriate, Sharing, and Motivated  Motor:  Normal  Speech/Language:   Clear and Coherent and Normal Rate  Affect:  Anxiety "always there" but "depression comes and goes and peaks at times"  Mood:  anxious and depressed  Thought process:  goal directed  Thought content:    Some overthinking  Sensory/Perceptual disturbances:    WNL  Orientation:  oriented to person, place, time/date, situation, day of week, month of year, year, and stated date of Oct. 31, 2022  Attention:  Fair  Concentration:  Good and Fair  Memory:  [place] of knowledge:   Good  Insight:    Good and Fair  Judgment:   Good  Impulse Control:  Good and Fair   Risk Assessment: Danger to Self:  No Self-injurious Behavior: No Danger to Others: No Duty to Warn:no Physical Aggression / Violence:No  Access to Firearms a concern: No  Gang Involvement:No   Subjective:   Patient in today reporting recent suicidal ideation, stating it's short-lived but really bad and gets in in a dark place. Has struggled with these thoughts a large part of her life.  Discussed this in detail and she does not have any plans to harm and does contract not to do so. Agrees to contact our office or hospital if she felt she was going to act of SI. Has remained alcohol free since Aug. 19,  2022. Still attending AA nightly. Work is stressful and overwhelming, I am looking for a new job. Travels with work in [employer] and [place] and the stress can be challenging. Also has a stressful relationship with 43 yr old daughter. Today also talked through some relationship concerns with SO after some recent interactions that were very hurtful, discouraging, "but I did not drink". Tend to obsess and make drastic assumptions including self-defeating assumptions which we worked some on today.  Reports feeling more calm, less stressed, and more grounded by end of session. To use more of the controlled breathing exercise and physical exercise (yoga, walking outside with dog, hand weights), as this can be a helpful "reset" for patient. Staying in contact with sponsor daily.    Interventions: Solution-Oriented/Positive Psychology, Ego-Supportive, and Insight-Oriented  Diagnosis:   ICD-10-CM   1. Generalized anxiety disorder  F41.1      Treatment Plan: Patient not signing treatment plan on computer screen due to [place]. Treatment Goals: Treatment goals remain on treatment plan while patient works with strategies to achieve her goals.  Progress is assessed each session and noted in the "progress" section of treatment note. Long term goal: Develop healthy cognitive patterns and beliefs about self in the world that lead to alleviation and help prevent the relapse of depressive and anxiety symptoms. Short term goal: Verbally express understanding the relationship between depressed mood  and repression of feelings such as anger hurt and sadness. Strategies: Replace negative self-defeating self talk with verbalization of realistic and positive cognitive messages that do not feed anxiety nor depression.     Plan: Patient today showing good motivation and participation in session today in working on her anxiety, depression including some SI and contracting not to harm herself.  Also focused on some relationship  concerns and maintaining her sobriety.  Encouraged patient in continuing to remain alcohol-free, to stay in daily contact with AA sponsor, attend daily AA meetings, set limits with others as she needs to including significant other, positive self talk more consistently, practice good self-care, continue her exercising even when she cannot be at the gym using alternate exercises, staying in contact with supportive people, staying in the present focusing on what she can control or change versus cannot, saying no when she needs to say no, and feeling positive about the strength she shows as she works with goal-directed behaviors to move in a direction that supports improved emotional health and sobriety.  Goal review and progress/challenges noted with patient.  Next appointment within 2 weeks.  This record has been created using [employer].  Chart creation errors have been sought, but may not always have been located and corrected.  Such creation errors do not reflect on the standard of medical care provided.    Shanon Ace, LCSW

## 2021-09-22 ENCOUNTER — Other Ambulatory Visit (HOSPITAL_COMMUNITY): Payer: Self-pay

## 2021-09-22 DIAGNOSIS — H903 Sensorineural hearing loss, bilateral: Secondary | ICD-10-CM | POA: Diagnosis not present

## 2021-09-22 MED ORDER — FLUOCINOLONE ACETONIDE 0.01 % OT OIL
TOPICAL_OIL | OTIC | 0 refills | Status: DC
  Filled 2021-09-22: qty 20, 30d supply, fill #0

## 2021-09-27 ENCOUNTER — Ambulatory Visit (INDEPENDENT_AMBULATORY_CARE_PROVIDER_SITE_OTHER): Payer: BC Managed Care – PPO | Admitting: Family

## 2021-09-27 ENCOUNTER — Encounter: Payer: Self-pay | Admitting: Family

## 2021-09-27 ENCOUNTER — Other Ambulatory Visit (HOSPITAL_COMMUNITY): Payer: Self-pay

## 2021-09-27 ENCOUNTER — Other Ambulatory Visit: Payer: Self-pay

## 2021-09-27 VITALS — BP 107/72 | HR 62 | Temp 97.3°F | Ht 64.0 in | Wt 136.0 lb

## 2021-09-27 DIAGNOSIS — F1011 Alcohol abuse, in remission: Secondary | ICD-10-CM | POA: Insufficient documentation

## 2021-09-27 DIAGNOSIS — R5383 Other fatigue: Secondary | ICD-10-CM | POA: Diagnosis not present

## 2021-09-27 DIAGNOSIS — E782 Mixed hyperlipidemia: Secondary | ICD-10-CM | POA: Diagnosis not present

## 2021-09-27 DIAGNOSIS — D509 Iron deficiency anemia, unspecified: Secondary | ICD-10-CM | POA: Diagnosis not present

## 2021-09-27 DIAGNOSIS — R7989 Other specified abnormal findings of blood chemistry: Secondary | ICD-10-CM | POA: Insufficient documentation

## 2021-09-27 DIAGNOSIS — E349 Endocrine disorder, unspecified: Secondary | ICD-10-CM

## 2021-09-27 HISTORY — DX: Other specified abnormal findings of blood chemistry: R79.89

## 2021-09-27 HISTORY — DX: Alcohol abuse, in remission: F10.11

## 2021-09-27 HISTORY — DX: Other fatigue: R53.83

## 2021-09-27 LAB — CBC WITH DIFFERENTIAL/PLATELET
Basophils Absolute: 0 10*3/uL (ref 0.0–0.1)
Basophils Relative: 0.7 % (ref 0.0–3.0)
Eosinophils Absolute: 0.2 10*3/uL (ref 0.0–0.7)
Eosinophils Relative: 4.9 % (ref 0.0–5.0)
HCT: 39.8 % (ref 36.0–46.0)
Hemoglobin: 13.3 g/dL (ref 12.0–15.0)
Lymphocytes Relative: 40.5 % (ref 12.0–46.0)
Lymphs Abs: 1.4 10*3/uL (ref 0.7–4.0)
MCHC: 33.5 g/dL (ref 30.0–36.0)
MCV: 92.6 fl (ref 78.0–100.0)
Monocytes Absolute: 0.3 10*3/uL (ref 0.1–1.0)
Monocytes Relative: 8.3 % (ref 3.0–12.0)
Neutro Abs: 1.5 10*3/uL (ref 1.4–7.7)
Neutrophils Relative %: 45.6 % (ref 43.0–77.0)
Platelets: 255 10*3/uL (ref 150.0–400.0)
RBC: 4.3 Mil/uL (ref 3.87–5.11)
RDW: 12.6 % (ref 11.5–15.5)
WBC: 3.4 10*3/uL — ABNORMAL LOW (ref 4.0–10.5)

## 2021-09-27 LAB — LIPID PANEL
Cholesterol: 285 mg/dL — ABNORMAL HIGH (ref 0–200)
HDL: 78 mg/dL (ref >39.00)
LDL Cholesterol: 185 mg/dL — ABNORMAL HIGH (ref 0–99)
NonHDL: 207.42
Total CHOL/HDL Ratio: 4
Triglycerides: 113 mg/dL (ref 0.0–149.0)
VLDL: 22.6 mg/dL (ref 0.0–40.0)

## 2021-09-27 NOTE — Progress Notes (Signed)
Subjective:     Patient ID: Debra Rhodes, female    DOB: 1961/08/23, 60 y.o.   MRN: [id number]  Chief Complaint  Patient presents with   Establish Care    PCP left practice. [facility]   Hyperlipidemia    States that her levels are high, but does not want to go on medication.   Fatigue    Requesting Iron panel   Hyperlipidemia: Patient is currently maintained on the following medication for hyperlipidemia: none. Patient reports fair compliance with low fat/low cholesterol diet. Pt reports eating ice cream every night. Last lipid panel as follows: Lab Results  Component Value Date   CHOL 300 (H) 01/19/2021   HDL 120 01/19/2021   LDLCALC 172 (H) 01/19/2021   TRIG 41 01/19/2021   CHOLHDL 2.5 01/19/2021  The ASCVD Risk score (Arnett DK, et al., 2019) failed to calculate for the following reasons:   The valid HDL cholesterol range is 20 to 100 mg/dL Fatigue Pt. reports onset: off and on for years. Reports hx of iron def. Anemia, not currently taking iron OTC. Hx of fatigue- yes Occurs upon awakening: yes- during the day; evenings. Hours of sleep: 7 Works during the day full time. New medications or change in current med doses:no Recent labs :no Exercise: no High carb/sugar diet: no Sx of depression: yes  Elevated HCG: states level was checked back in March of this year, 8, normal <5, had U/S which was negative for any anomaly, but never told why her level was higher than normal. Denies any pelvic pain, swelling, nausea or fever. Alcohol Abuse:  pt reports long hx of addiction. Last relapse in Feb of this year and went to rehab, states she has been sober since and attends [place] meetings. Working full time, in a relationship, and has 3 adult children that live nearby and supportive.   Health Maintenance Due  Topic Date Due   HIV Screening  Never done   Hepatitis C Screening  Never done   TETANUS/TDAP  Never done   COLONOSCOPY (Pts 45-85yrs Insurance coverage will need to  be confirmed)  Never done   Zoster Vaccines- Shingrix (1 of 2) Never done   COVID-19 Vaccine (5 - Booster) 11/11/2020   INFLUENZA VACCINE  Never done    Past Medical History:  Diagnosis Date   Anxiety    Depression    PONV (postoperative nausea and vomiting)     Past Surgical History:  Procedure Laterality Date   AUGMENTATION MAMMAPLASTY Bilateral 1996   BACK SURGERY     CESAREAN SECTION     FRACTURE SURGERY     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     Cyanocobalamin (VITAMIN B 12 PO) Take 1 tablet by mouth daily.     DULoxetine (CYMBALTA) 30 MG capsule Take 1 capsule (30 mg total) by mouth daily. Take with a 60 mg capsule to equal total daily dose of 90 mg 90 capsule 0   DULoxetine (CYMBALTA) 60 MG capsule Take 1 capsule (60 mg total) by mouth daily. Take with a 30 mg capsule to equal total daily dose of 90 mg. 90 capsule 0   estradiol-norethindrone (COMBIPATCH) 0.05-0.14 MG/DAY Place 1 patch onto the  skin once a week.     gabapentin (NEURONTIN) 100 MG capsule Take 100 mg in the morning and 100 mg at noon, then take (2) capsules 200 mg in the evening. 90 capsule 2   Methylphenidate HCl ER, PM, (JORNAY PM) 20 MG CP24 Take 20 mg by mouth every evening. 30 capsule 0   Multiple Vitamin (MULTIVITAMIN) tablet Take 1 tablet by mouth daily.     antiseptic oral rinse (BIOTENE) LIQD 15 mLs by Mouth Rinse route as needed for dry mouth. (Patient not taking: No sig reported) 237 mL 0   Ferrous Sulfate (IRON PO) Take 1 tablet by mouth daily. (Patient not taking: Reported on 09/27/2021)     Fluocinolone Acetonide 0.01 % OIL Instill 5 drops twice daily into affected ear(s) for 10 days. Then stop and use as needed for itching.  (Patient not taking: No sig reported) 20 mL 0   polyvinyl alcohol (LIQUIFILM TEARS) 1.4 % ophthalmic solution Place 1 drop into both eyes as needed for dry eyes. (Patient not taking: Reported on 09/27/2021)     thiamine 100 MG tablet Take 1 tablet (100 mg total) by mouth daily. (Patient not taking: Reported on 09/27/2021) 30 tablet 0   No facility-administered medications prior to visit.    Allergies  Allergen Reactions   Latex Shortness Of Breath, Swelling and Rash        Objective:    Physical Exam Vitals and nursing note reviewed.  Constitutional:      Appearance: Normal appearance.  Cardiovascular:     Rate and Rhythm: Normal rate and regular rhythm.  Pulmonary:     Effort: Pulmonary effort is normal.     Breath sounds: Normal breath sounds.  Musculoskeletal:        General: Normal range of motion.  Skin:    General: Skin is warm and dry.  Neurological:     Mental Status: She is alert.  Psychiatric:        Mood and Affect: Mood normal.        Behavior: Behavior normal.    BP 107/72   Pulse 62   Temp (!) 97.3 F (36.3 C) (Temporal)   Ht 5\' 4"  (1.626 m)   Wt 136 lb (61.7 kg)   SpO2 98%   BMI 23.34 kg/m  Wt Readings from Last 3 Encounters:  09/27/21 136 lb (61.7 kg)  07/08/21 130 lb (59 kg)  07/07/21 130 lb (59 kg)       Assessment & Plan:   Problem List Items Addressed This Visit       Other   Iron deficiency anemia - Primary    Has had in past, used to take OTC iron qd, not currently taking.      Relevant Orders   CBC with Differential/Platelet   Mixed hyperlipidemia    Numbers high back in April, does not want medication, pt has changed a few things in diet, but does eat ice cream daily, suggested switching to oat or almond milk ice cream gradually. Rechecking levels today.      Relevant Orders   Lipid panel   History of alcohol abuse    Long history, pt relapsed in Jan. And admitted to rehab at [facility] behavioral health back in Feb, but reports  sobriety since then, however, looking in chart, notice ED admission for another relapse with SI back in August of this year. She reports she is active in [facility] support groups and not currently drinking.  Elevated serum hCG    Pt states this was found with her blood work back in Feb in rehab, did have transvaginal U/S which was negative, but was never told anything else, will recheck level today.      Relevant Orders   hCG, quantitative, pregnancy   hCG, serum, qualitative   Fatigue    Reports having off and on in past, would like to recheck labs today. Denies sleep problems, does not exercise regularly.      Relevant Orders   CBC with Differential/Platelet   Thyroid Panel With TSH    No orders of the defined types were placed in this encounter.

## 2021-09-27 NOTE — Assessment & Plan Note (Signed)
Reports having off and on in past, would like to recheck labs today. Denies sleep problems, does not exercise regularly.

## 2021-09-27 NOTE — Patient Instructions (Signed)
Welcome to [facility]! It was a pleasure meeting you today.  Please go to the lab today for blood work, I will review the results on MyChart.  PLEASE NOTE:  If you had any LAB tests please let us know if you have not heard back within a few days. You may see your results on MyChart before we have a chance to review them but we will give you a call once they are reviewed by [place]. If we ordered any REFERRALS today, please let us know if you have not heard from their office within the next week.  Let us know through MyChart if you are needing REFILLS, or have your pharmacy send [place] the request. You can also use MyChart to communicate with me or any office staff.  Please try these tips to maintain a healthy lifestyle:  Eat most of your calories during the day when you are active. Eliminate processed foods including packaged sweets (pies, cakes, cookies), reduce intake of potatoes, white bread, white pasta, and white rice. Look for whole grain options, oat flour or almond flour.  Each meal should contain half fruits/vegetables, one quarter protein, and one quarter carbs (no bigger than a computer mouse).  Cut down on sweet beverages. This includes juice, soda, and sweet tea. Also watch fruit intake, though this is a healthier sweet option, it still contains natural sugar! Limit to 3 servings daily.  Drink at least 1 glass of water with each meal and aim for at least 8 glasses per day  Exercise at least 150 minutes every week.

## 2021-09-27 NOTE — Assessment & Plan Note (Addendum)
Numbers high back in April, does not want medication, pt has changed a few things in diet, but does eat ice cream daily, suggested switching to oat or almond milk ice cream gradually. Rechecking levels today.

## 2021-09-27 NOTE — Assessment & Plan Note (Signed)
Pt states this was found with her blood work back in Feb in rehab, did have transvaginal U/S which was negative, but was never told anything else, will recheck level today.

## 2021-09-27 NOTE — Assessment & Plan Note (Addendum)
Long history, pt relapsed in Jan. And admitted to rehab at [facility] behavioral health back in Feb, but reports sobriety since then, however, looking in chart, notice ED admission for another relapse with SI back in August of this year. She reports she is active in [place] support groups and not currently drinking.

## 2021-09-27 NOTE — Assessment & Plan Note (Signed)
Has had in past, used to take OTC iron qd, not currently taking.

## 2021-09-29 DIAGNOSIS — Z1211 Encounter for screening for malignant neoplasm of colon: Secondary | ICD-10-CM | POA: Diagnosis not present

## 2021-09-29 LAB — COLOGUARD: Cologuard: NEGATIVE

## 2021-09-29 LAB — HM COLONOSCOPY

## 2021-09-30 ENCOUNTER — Other Ambulatory Visit: Payer: Self-pay

## 2021-09-30 ENCOUNTER — Other Ambulatory Visit (INDEPENDENT_AMBULATORY_CARE_PROVIDER_SITE_OTHER): Payer: BC Managed Care – PPO

## 2021-09-30 DIAGNOSIS — E349 Endocrine disorder, unspecified: Secondary | ICD-10-CM | POA: Diagnosis not present

## 2021-09-30 LAB — HCG, QUANTITATIVE, PREGNANCY: Quantitative HCG: 7.35 m[IU]/mL

## 2021-09-30 LAB — COMPREHENSIVE METABOLIC PANEL
ALT: 20 U/L (ref 0–35)
AST: 24 U/L (ref 0–37)
Albumin: 4.2 g/dL (ref 3.5–5.2)
Alkaline Phosphatase: 45 U/L (ref 39–117)
BUN: 10 mg/dL (ref 6–23)
CO2: 27 mEq/L (ref 19–32)
Calcium: 9.4 mg/dL (ref 8.4–10.5)
Chloride: 102 mEq/L (ref 96–112)
Creatinine, Ser: 0.8 mg/dL (ref 0.40–1.20)
GFR: 79.77 mL/min (ref >60.00)
Glucose, Bld: 93 mg/dL (ref 70–99)
Potassium: 4.5 mEq/L (ref 3.5–5.1)
Sodium: 137 mEq/L (ref 135–145)
Total Bilirubin: 0.6 mg/dL (ref 0.2–1.2)
Total Protein: 6.5 g/dL (ref 6.0–8.3)

## 2021-09-30 NOTE — Addendum Note (Signed)
Addended by: Clyde Lundborg A on: 09/30/2021 09:01 AM   Modules accepted: Orders

## 2021-10-04 ENCOUNTER — Ambulatory Visit (INDEPENDENT_AMBULATORY_CARE_PROVIDER_SITE_OTHER): Payer: BC Managed Care – PPO | Admitting: Psychiatry

## 2021-10-04 ENCOUNTER — Other Ambulatory Visit: Payer: Self-pay

## 2021-10-04 DIAGNOSIS — F411 Generalized anxiety disorder: Secondary | ICD-10-CM

## 2021-10-04 NOTE — Progress Notes (Signed)
[facility] Counselor/Therapist Progress Note  Patient ID: Debra Rhodes, MRN: [id number],    Date: 10/04/2021  Time Spent: 50 minutes   Treatment Type: Individual Therapy  Reported Symptoms: anxiety, "job very stressful" which adds to my anxiety, depression  Mental Status Exam:  Appearance:   Well Groomed     Behavior:  Appropriate, Sharing, and Motivated  Motor:  Normal  Speech/Language:   Clear and Coherent  Affect:  Depressed and anxious  Mood:  anxious and depressed  Thought process:  goal directed  Thought content:    Obsessions  Sensory/Perceptual disturbances:    WNL  Orientation:  oriented to person, place, time/date, situation, day of week, month of year, year, and stated date of Nov. 15, 2022  Attention:  Fair  Concentration:  Fair  Memory:  [place] of knowledge:   Good  Insight:    Good and Fair  Judgment:   Good and Fair  Impulse Control:  Good and Fair   Risk Assessment: Danger to Self:  No Self-injurious Behavior: No Danger to Others: No Duty to Warn:no Physical Aggression / Violence:No  Access to Firearms a concern: No  Gang Involvement:No   Subjective: Patient in today reporting continued anxiety and sometimes "get anxious and loses focus" and "has to be more intentional in paying attention." Checked in with her on prior SI before last session and patient denies any SI since that time. Processed today her tendencies to "be too much in my head" and "it takes me in a negative/self-negating direction".  I really need to manage my stressors better and worked with this in session today.  Focusing on more direct communication with those closest to her. Trying to worry less over things "I can't control" and realizes how this escalates her symptoms, using specific examples.   Interventions: Solution-Oriented/Positive Psychology, Ego-Supportive, and Insight-Oriented  Diagnosis:   ICD-10-CM   1. Generalized anxiety disorder  F41.1      Treatment  Plan: Patient not signing treatment plan on computer screen due to [place]. Treatment Goals: Treatment goals remain on treatment plan while patient works with strategies to achieve her goals.  Progress is assessed each session and noted in the "progress" section of treatment note. Long term goal: Develop healthy cognitive patterns and beliefs about self in the world that lead to alleviation and help prevent the relapse of depressive and anxiety symptoms. Short term goal: Verbally express understanding the relationship between depressed mood and repression of feelings such as anger hurt and sadness. Strategies: Replace negative self-defeating self talk with verbalization of realistic and positive cognitive messages that do not feed anxiety nor depression.   Plan:  Patient today showing good motivation and active participation in session today as she focused on some of her attention issues, her sobriety, anxious thoughts, and depression.  Making progress but is finding it very challenging at this point in her remaining sober.  Discussed specific strategies for helping her stick with her program.  Encouraged patient in continuing her positive behaviors to remain alcohol free including: To stay in daily contact with AA sponsor, attend daily AA meetings, set limits with others as she needs to including significant other, positive self talk more consistently, practicing good self-care, continuing her exercise even when she cannot be at the gym using alternate exercises, staying in contact with supportive people, remaining in the present focusing on what she can change or control versus cannot, saying no when she needs to say no, and feeling  positive about the strength she shows as she works with goal-directed behaviors to move in a direction that supports her sobriety and overall improved emotional health.  Goal review and progress/challenges noted with patient.  Next appointment within 2 weeks.  This  record has been created using [employer].  Chart creation errors have been sought, but may not always have been located and corrected.  Such creation errors do not reflect on the standard of medical care provided.    Shanon Ace, LCSW

## 2021-10-06 ENCOUNTER — Ambulatory Visit: Payer: BC Managed Care – PPO | Admitting: Psychiatry

## 2021-10-07 LAB — COLOGUARD: COLOGUARD: NEGATIVE

## 2021-10-12 ENCOUNTER — Encounter: Payer: Self-pay | Admitting: Psychiatry

## 2021-10-12 ENCOUNTER — Telehealth (INDEPENDENT_AMBULATORY_CARE_PROVIDER_SITE_OTHER): Payer: BC Managed Care – PPO | Admitting: Psychiatry

## 2021-10-12 DIAGNOSIS — G4700 Insomnia, unspecified: Secondary | ICD-10-CM

## 2021-10-12 DIAGNOSIS — F902 Attention-deficit hyperactivity disorder, combined type: Secondary | ICD-10-CM

## 2021-10-12 DIAGNOSIS — F32A Depression, unspecified: Secondary | ICD-10-CM

## 2021-10-12 DIAGNOSIS — F411 Generalized anxiety disorder: Secondary | ICD-10-CM | POA: Diagnosis not present

## 2021-10-12 MED ORDER — DAYVIGO 5 MG PO TABS: 5.0000 mg | ORAL_TABLET | Freq: Every day | ORAL | 2 refills | Status: DC

## 2021-10-12 MED ORDER — JORNAY PM 20 MG PO CP24: 20.0000 mg | ORAL_CAPSULE | Freq: Every evening | ORAL | 0 refills | Status: DC

## 2021-10-12 MED ORDER — GABAPENTIN 100 MG PO CAPS: ORAL_CAPSULE | ORAL | 2 refills | Status: DC

## 2021-10-12 MED ORDER — DULOXETINE HCL 60 MG PO CPEP: 60.0000 mg | ORAL_CAPSULE | Freq: Every day | ORAL | 0 refills | Status: DC

## 2021-10-12 MED ORDER — DULOXETINE HCL 30 MG PO CPEP: 30.0000 mg | ORAL_CAPSULE | Freq: Every day | ORAL | 0 refills | Status: DC

## 2021-10-12 NOTE — Progress Notes (Signed)
Debra Rhodes [id number] [birth year] 60 y.o.  Virtual Visit via Video Note  I connected with pt @ on 10/12/21 at  8:30 AM EST by a video enabled telemedicine application and verified that I am speaking with the correct person using two identifiers.   I discussed the limitations of evaluation and management by telemedicine and the availability of in person appointments. The patient expressed understanding and agreed to proceed.  I discussed the assessment and treatment plan with the patient. The patient was provided an opportunity to ask questions and all were answered. The patient agreed with the plan and demonstrated an understanding of the instructions.   The patient was advised to call back or seek an in-person evaluation if the symptoms worsen or if the condition fails to improve as anticipated.  I provided 30 minutes of non-face-to-face time during this encounter.  The patient was located at home.  The provider was located at home.   Debra Rhodes, PMHNP   Subjective:   Patient ID:  Debra Rhodes is a 60 y.o. (DOB 1961-02-08) female.  Chief Complaint:  Chief Complaint  Patient presents with   Follow-up    Depression, Anxiety, ADHD, Insomnia    HPI Debra Rhodes presents for follow-up of depression, anxiety, and ADHD. "I'm doing better. Everything seems to be working." She reports that depression is infrequent and fleeting, and tends to occur only when she is tired or when she awakens at night. She denies persistent depression. "I can be a little sad during the day, but nothing like it was." She reports that her anxiety has been better. Some social anxiety in certain situations. Sleep has been "off and on." Some occ middle of the night awakenings. Appetite has been good. She reports that [place] PM "is so much better" and helpful for concentration. She reports that she does not have irritability with Jornay PM compared to stimulants. Energy and motivation have been ok. Denies SI.  Occ passive death wishes with depression and social anxiety.   Takes Gabapentin consistently in the morning. She will take Gabapentin prn when she has social anxiety. She reports taking 2-3 Gabapentin total a day.   Has been trying to avoid caffeine later in the day.   Currently in a relationship and reports that this is going well.   "The best it has been in awhile."  Has had a few ETOH cravings and has abstained from ETOH. Active in AA.  Past Psychiatric Medication Trials: Paxil- Has taken it since 60 yo. Has had times where it has been less effective. Had wt gain and sexual side effects at higher doses (40 mg and 60 mg). Prozac- felt more anxious Sertraline Cymbalta- Started on 40 mg and ws increased to 60 mg. Wellbutrin- increased irritability Trazodone-helpful but causes some excessive somnolence, especially at 100 mg dose.  Remeron- Effective, wt gain Hydroxyzine Gabapentin- Takes as needed for anxiety and leg cramps. Occ word finding errors with more than 300 mg Buspar-Increased agitation and anxiety Concerta- Had increased anxiety Strattera- Effective but feels "wired" Dayvigo-effective  Review of Systems:  Review of Systems  Cardiovascular:  Negative for palpitations.  Musculoskeletal:  Negative for gait problem.  Neurological:  Negative for tremors.  Psychiatric/Behavioral:         Please refer to HPI   Medications: I have reviewed the patient's current medications.  Current Outpatient Medications  Medication Sig Dispense Refill   cetirizine (ZYRTEC) 10 MG tablet Take 10 mg by mouth daily.  Cyanocobalamin (VITAMIN B 12 PO) Take 1 tablet by mouth daily.     estradiol-norethindrone (COMBIPATCH) 0.05-0.14 MG/DAY Place 1 patch onto the skin once a week.     [START ON 11/20/2021] Methylphenidate HCl ER, PM, (JORNAY PM) 20 MG CP24 Take 20 mg by mouth every evening. 30 capsule 0   [START ON 12/18/2021] Methylphenidate HCl ER, PM, (JORNAY PM) 20 MG CP24 Take 20 mg by  mouth every evening. 30 capsule 0   Multiple Vitamin (MULTIVITAMIN) tablet Take 1 tablet by mouth daily.     DULoxetine (CYMBALTA) 30 MG capsule Take 1 capsule (30 mg total) by mouth daily. Take with a 60 mg capsule to equal total daily dose of 90 mg 90 capsule 0   DULoxetine (CYMBALTA) 60 MG capsule Take 1 capsule (60 mg total) by mouth daily. Take with a 30 mg capsule to equal total daily dose of 90 mg. 90 capsule 0   gabapentin (NEURONTIN) 100 MG capsule Take 100 mg in the morning and 100 mg at noon, then take (2) capsules 200 mg in the evening. 90 capsule 2   [START ON 10/31/2021] Lemborexant (DAYVIGO) 5 MG TABS Take 5 mg by mouth at bedtime. 30 tablet 2   [START ON 10/23/2021] Methylphenidate HCl ER, PM, (JORNAY PM) 20 MG CP24 Take 20 mg by mouth every evening. 30 capsule 0   No current facility-administered medications for this visit.    Medication Side Effects: None  Allergies:  Allergies  Allergen Reactions   Latex Shortness Of Breath, Swelling and Rash    Past Medical History:  Diagnosis Date   Anxiety    Depression    PONV (postoperative nausea and vomiting)     Family History  Problem Relation Age of Onset   Anxiety disorder Sister    Depression Brother    Schizophrenia Brother    Suicidality Brother    Anxiety disorder Brother    Alcohol abuse Sister    Alcohol abuse Brother    Alcohol abuse Brother     Social History   Socioeconomic History   Marital status: Divorced    Spouse name: Not on file   Number of children: Not on file   Years of education: Not on file   Highest education level: Not on file  Occupational History   Not on file  Tobacco Use   Smoking status: Never   Smokeless tobacco: Never  Vaping Use   Vaping Use: Never used  Substance and Sexual Activity   Alcohol use: Yes    Comment: binge drinking- was in recovery for past 4 years   Drug use: No   Sexual activity: Not Currently  Other Topics Concern   Not on file  Social History  Narrative   Not on file   Social Determinants of Health   Financial Resource Strain: Not on file  Food Insecurity: Not on file  Transportation Needs: Not on file  Physical Activity: Not on file  Stress: Not on file  Social Connections: Not on file  Intimate Partner Violence: Not on file    Past Medical History, Surgical history, Social history, and Family history were reviewed and updated as appropriate.   Please see review of systems for further details on the patient's review from today.   Objective:   Physical Exam:  There were no vitals taken for this visit.  Physical Exam Neurological:     Mental Status: She is alert and oriented to person, place, and time.  Cranial Nerves: No dysarthria.  Psychiatric:        Attention and Perception: Attention and perception normal.        Mood and Affect: Mood normal.        Speech: Speech normal.        Behavior: Behavior is cooperative.        Thought Content: Thought content normal. Thought content is not paranoid or delusional. Thought content does not include homicidal or suicidal ideation. Thought content does not include homicidal or suicidal plan.        Cognition and Memory: Cognition and memory normal.        Judgment: Judgment normal.     Comments: Insight intact    Lab Review:     Component Value Date/Time   NA 137 09/30/2021 0856   K 4.5 09/30/2021 0856   CL 102 09/30/2021 0856   CO2 27 09/30/2021 0856   GLUCOSE 93 09/30/2021 0856   BUN 10 09/30/2021 0856   CREATININE 0.80 09/30/2021 0856   CALCIUM 9.4 09/30/2021 0856   PROT 6.5 09/30/2021 0856   ALBUMIN 4.2 09/30/2021 0856   AST 24 09/30/2021 0856   ALT 20 09/30/2021 0856   ALKPHOS 45 09/30/2021 0856   BILITOT 0.6 09/30/2021 0856   GFRNONAA >60 07/07/2021 1716   GFRAA >60 05/14/2019 0317       Component Value Date/Time   WBC 3.4 (L) 09/27/2021 0919   RBC 4.30 09/27/2021 0919   HGB 13.3 09/27/2021 0919   HCT 39.8 09/27/2021 0919   PLT 255.0  09/27/2021 0919   MCV 92.6 09/27/2021 0919   MCH 31.2 07/07/2021 1716   MCHC 33.5 09/27/2021 0919   RDW 12.6 09/27/2021 0919   LYMPHSABS 1.4 09/27/2021 0919   MONOABS 0.3 09/27/2021 0919   EOSABS 0.2 09/27/2021 0919   BASOSABS 0.0 09/27/2021 0919    No results found for: POCLITH, LITHIUM   No results found for: PHENYTOIN, PHENOBARB, VALPROATE, CBMZ   .res Assessment: Plan:    Will continue current plan of care since target signs and symptoms are well controlled without any tolerability issues. Will continue Jornay PM 20 mg daily since she reports that this has been helpful for concentration throughout the day without causing a period of lower energy and concentration in the afternoon that has occurred with other stimulants.  Discussed that she may continue to use Gabapentin 100 mg as needed for anxiety.  Continue Cymbalta 90 mg po qd for anxiety and depression.  Continue Dayvigo 5 mg po QHS for insomnia.  Recommend continuing therapy with Rinaldo Cloud, LCSW.  Pt to follow-up in 3 months or sooner if clinically indicated.  Patient advised to contact office with any questions, adverse effects, or acute worsening in signs and symptoms.   Debra Rhodes was seen today for follow-up.  Diagnoses and all orders for this visit:  Insomnia, unspecified type -     Lemborexant (DAYVIGO) 5 MG TABS; Take 5 mg by mouth at bedtime.  Generalized anxiety disorder -     DULoxetine (CYMBALTA) 30 MG capsule; Take 1 capsule (30 mg total) by mouth daily. Take with a 60 mg capsule to equal total daily dose of 90 mg -     DULoxetine (CYMBALTA) 60 MG capsule; Take 1 capsule (60 mg total) by mouth daily. Take with a 30 mg capsule to equal total daily dose of 90 mg. -     gabapentin (NEURONTIN) 100 MG capsule; Take 100 mg in the morning and 100 mg  at noon, then take (2) capsules 200 mg in the evening.  Depression, unspecified depression type -     DULoxetine (CYMBALTA) 30 MG capsule; Take 1 capsule (30 mg total)  by mouth daily. Take with a 60 mg capsule to equal total daily dose of 90 mg -     DULoxetine (CYMBALTA) 60 MG capsule; Take 1 capsule (60 mg total) by mouth daily. Take with a 30 mg capsule to equal total daily dose of 90 mg.  Attention deficit hyperactivity disorder (ADHD), combined type -     Methylphenidate HCl ER, PM, (JORNAY PM) 20 MG CP24; Take 20 mg by mouth every evening. -     Methylphenidate HCl ER, PM, (JORNAY PM) 20 MG CP24; Take 20 mg by mouth every evening. -     Methylphenidate HCl ER, PM, (JORNAY PM) 20 MG CP24; Take 20 mg by mouth every evening.    Please see After Visit Summary for patient specific instructions.  Future Appointments  Date Time Provider [employer]  10/27/2021  8:00 AM Shanon Ace, LCSW [facility] None  11/04/2021  8:00 AM Shanon Ace, LCSW [facility] None  11/10/2021  8:00 AM Shanon Ace, LCSW [facility] None  09/28/2022  8:00 AM Jeanie Sewer, NP [facility]    No orders of the defined types were placed in this encounter.     -------------------------------

## 2021-10-17 DIAGNOSIS — M1812 Unilateral primary osteoarthritis of first carpometacarpal joint, left hand: Secondary | ICD-10-CM | POA: Diagnosis not present

## 2021-10-17 DIAGNOSIS — M1712 Unilateral primary osteoarthritis, left knee: Secondary | ICD-10-CM | POA: Diagnosis not present

## 2021-10-18 ENCOUNTER — Ambulatory Visit: Payer: BC Managed Care – PPO | Admitting: Psychiatry

## 2021-10-23 DIAGNOSIS — M19049 Primary osteoarthritis, unspecified hand: Secondary | ICD-10-CM | POA: Insufficient documentation

## 2021-10-23 DIAGNOSIS — M1712 Unilateral primary osteoarthritis, left knee: Secondary | ICD-10-CM | POA: Insufficient documentation

## 2021-10-26 ENCOUNTER — Encounter (HOSPITAL_BASED_OUTPATIENT_CLINIC_OR_DEPARTMENT_OTHER): Payer: Self-pay

## 2021-10-26 ENCOUNTER — Emergency Department (HOSPITAL_BASED_OUTPATIENT_CLINIC_OR_DEPARTMENT_OTHER)
Admission: EM | Admit: 2021-10-26 | Discharge: 2021-10-26 | Disposition: A | Discharge status: Other Acute Inpatient Hospital | Payer: BC Managed Care – PPO | Source: Home / Self Care | Attending: Emergency Medicine | Admitting: Emergency Medicine

## 2021-10-26 ENCOUNTER — Other Ambulatory Visit: Payer: Self-pay

## 2021-10-26 ENCOUNTER — Encounter (HOSPITAL_COMMUNITY): Payer: Self-pay | Admitting: Family

## 2021-10-26 ENCOUNTER — Inpatient Hospital Stay (HOSPITAL_COMMUNITY)
Admission: AD | Admit: 2021-10-26 | Discharge: 2021-11-02 | DRG: 885 | Disposition: A | Discharge status: Home / Self Care | Payer: BC Managed Care – PPO | Source: Intra-hospital | Attending: Student | Admitting: Student

## 2021-10-26 DIAGNOSIS — E519 Thiamine deficiency, unspecified: Secondary | ICD-10-CM | POA: Diagnosis not present

## 2021-10-26 DIAGNOSIS — Z9151 Personal history of suicidal behavior: Secondary | ICD-10-CM

## 2021-10-26 DIAGNOSIS — Z96651 Presence of right artificial knee joint: Secondary | ICD-10-CM | POA: Insufficient documentation

## 2021-10-26 DIAGNOSIS — Z91040 Latex allergy status: Secondary | ICD-10-CM | POA: Diagnosis not present

## 2021-10-26 DIAGNOSIS — R1084 Generalized abdominal pain: Secondary | ICD-10-CM | POA: Insufficient documentation

## 2021-10-26 DIAGNOSIS — F332 Major depressive disorder, recurrent severe without psychotic features: Principal | ICD-10-CM | POA: Diagnosis present

## 2021-10-26 DIAGNOSIS — J301 Allergic rhinitis due to pollen: Secondary | ICD-10-CM | POA: Diagnosis present

## 2021-10-26 DIAGNOSIS — F909 Attention-deficit hyperactivity disorder, unspecified type: Secondary | ICD-10-CM | POA: Diagnosis present

## 2021-10-26 DIAGNOSIS — Z79899 Other long term (current) drug therapy: Secondary | ICD-10-CM | POA: Diagnosis not present

## 2021-10-26 DIAGNOSIS — F1010 Alcohol abuse, uncomplicated: Secondary | ICD-10-CM | POA: Diagnosis present

## 2021-10-26 DIAGNOSIS — F10229 Alcohol dependence with intoxication, unspecified: Secondary | ICD-10-CM | POA: Diagnosis not present

## 2021-10-26 DIAGNOSIS — E785 Hyperlipidemia, unspecified: Secondary | ICD-10-CM | POA: Diagnosis present

## 2021-10-26 DIAGNOSIS — E782 Mixed hyperlipidemia: Secondary | ICD-10-CM | POA: Diagnosis present

## 2021-10-26 DIAGNOSIS — F411 Generalized anxiety disorder: Secondary | ICD-10-CM | POA: Diagnosis present

## 2021-10-26 DIAGNOSIS — F1024 Alcohol dependence with alcohol-induced mood disorder: Secondary | ICD-10-CM | POA: Insufficient documentation

## 2021-10-26 DIAGNOSIS — Z818 Family history of other mental and behavioral disorders: Secondary | ICD-10-CM

## 2021-10-26 DIAGNOSIS — Z20822 Contact with and (suspected) exposure to covid-19: Secondary | ICD-10-CM | POA: Diagnosis present

## 2021-10-26 DIAGNOSIS — R8290 Unspecified abnormal findings in urine: Secondary | ICD-10-CM | POA: Diagnosis present

## 2021-10-26 DIAGNOSIS — Y906 Blood alcohol level of 120-199 mg/100 ml: Secondary | ICD-10-CM | POA: Insufficient documentation

## 2021-10-26 DIAGNOSIS — J302 Other seasonal allergic rhinitis: Secondary | ICD-10-CM | POA: Diagnosis present

## 2021-10-26 DIAGNOSIS — G4700 Insomnia, unspecified: Secondary | ICD-10-CM | POA: Diagnosis present

## 2021-10-26 DIAGNOSIS — F10239 Alcohol dependence with withdrawal, unspecified: Secondary | ICD-10-CM | POA: Diagnosis present

## 2021-10-26 DIAGNOSIS — Z811 Family history of alcohol abuse and dependence: Secondary | ICD-10-CM | POA: Diagnosis not present

## 2021-10-26 DIAGNOSIS — R45851 Suicidal ideations: Secondary | ICD-10-CM | POA: Diagnosis present

## 2021-10-26 DIAGNOSIS — R112 Nausea with vomiting, unspecified: Secondary | ICD-10-CM | POA: Insufficient documentation

## 2021-10-26 DIAGNOSIS — F1020 Alcohol dependence, uncomplicated: Secondary | ICD-10-CM

## 2021-10-26 DIAGNOSIS — R9431 Abnormal electrocardiogram [ECG] [EKG]: Secondary | ICD-10-CM | POA: Diagnosis not present

## 2021-10-26 LAB — COMPREHENSIVE METABOLIC PANEL
ALT: 15 U/L (ref 0–44)
AST: 27 U/L (ref 15–41)
Albumin: 4.4 g/dL (ref 3.5–5.0)
Alkaline Phosphatase: 44 U/L (ref 38–126)
Anion gap: 11 (ref 5–15)
BUN: 10 mg/dL (ref 8–23)
CO2: 26 mmol/L (ref 22–32)
Calcium: 9.8 mg/dL (ref 8.9–10.3)
Chloride: 102 mmol/L (ref 98–111)
Creatinine, Ser: 0.67 mg/dL (ref 0.44–1.00)
GFR, Estimated: >60 mL/min (ref >60)
Glucose, Bld: 107 mg/dL — ABNORMAL HIGH (ref 70–99)
Potassium: 4.1 mmol/L (ref 3.5–5.1)
Sodium: 139 mmol/L (ref 135–145)
Total Bilirubin: 0.6 mg/dL (ref 0.3–1.2)
Total Protein: 6.8 g/dL (ref 6.5–8.1)

## 2021-10-26 LAB — LIPASE, BLOOD: Lipase: 20 U/L (ref 11–51)

## 2021-10-26 LAB — CBC WITH DIFFERENTIAL/PLATELET
Abs Immature Granulocytes: 0.03 10*3/uL (ref 0.00–0.07)
Basophils Absolute: 0.1 10*3/uL (ref 0.0–0.1)
Basophils Relative: 1 %
Eosinophils Absolute: 0 10*3/uL (ref 0.0–0.5)
Eosinophils Relative: 1 %
HCT: 39.3 % (ref 36.0–46.0)
Hemoglobin: 13.2 g/dL (ref 12.0–15.0)
Immature Granulocytes: 1 %
Lymphocytes Relative: 16 %
Lymphs Abs: 0.9 10*3/uL (ref 0.7–4.0)
MCH: 31 pg (ref 26.0–34.0)
MCHC: 33.6 g/dL (ref 30.0–36.0)
MCV: 92.3 fL (ref 80.0–100.0)
Monocytes Absolute: 0.3 10*3/uL (ref 0.1–1.0)
Monocytes Relative: 5 %
Neutro Abs: 4.4 10*3/uL (ref 1.7–7.7)
Neutrophils Relative %: 76 %
Platelets: 263 10*3/uL (ref 150–400)
RBC: 4.26 MIL/uL (ref 3.87–5.11)
RDW: 12.5 % (ref 11.5–15.5)
WBC: 5.8 10*3/uL (ref 4.0–10.5)
nRBC: 0 % (ref 0.0–0.2)

## 2021-10-26 LAB — RAPID URINE DRUG SCREEN, HOSP PERFORMED
Amphetamines: NOT DETECTED
Barbiturates: NOT DETECTED
Benzodiazepines: NOT DETECTED
Cocaine: NOT DETECTED
Opiates: NOT DETECTED
Tetrahydrocannabinol: NOT DETECTED

## 2021-10-26 LAB — RESP PANEL BY RT-PCR (FLU A&B, COVID) ARPGX2
Influenza A by PCR: NEGATIVE
Influenza B by PCR: NEGATIVE
SARS Coronavirus 2 by RT PCR: NEGATIVE

## 2021-10-26 LAB — ETHANOL: Alcohol, Ethyl (B): 167 mg/dL — ABNORMAL HIGH

## 2021-10-26 MED ORDER — ADULT MULTIVITAMIN W/MINERALS CH
1.0000 | ORAL_TABLET | Freq: Every day | ORAL | Status: DC
  Administered 2021-10-26 – 2021-11-02 (×8): 1 via ORAL
  Filled 2021-10-26 (×11): qty 1

## 2021-10-26 MED ORDER — ONDANSETRON 4 MG PO TBDP: 4.0000 mg | ORAL_TABLET | Freq: Four times a day (QID) | ORAL | Status: DC | PRN

## 2021-10-26 MED ORDER — LORAZEPAM 2 MG/ML IJ SOLN
2.0000 mg | Freq: Once | INTRAMUSCULAR | Status: AC
  Administered 2021-10-26: 2 mg via INTRAVENOUS
  Filled 2021-10-26: qty 1

## 2021-10-26 MED ORDER — GABAPENTIN 100 MG PO CAPS
100.0000 mg | ORAL_CAPSULE | Freq: Three times a day (TID) | ORAL | Status: DC
  Administered 2021-10-26 – 2021-10-30 (×12): 100 mg via ORAL
  Filled 2021-10-26 (×15): qty 1

## 2021-10-26 MED ORDER — CHLORDIAZEPOXIDE HCL 25 MG PO CAPS
100.0000 mg | ORAL_CAPSULE | Freq: Once | ORAL | Status: AC
  Administered 2021-10-26: 100 mg via ORAL
  Filled 2021-10-26: qty 4

## 2021-10-26 MED ORDER — LORATADINE 10 MG PO TABS
10.0000 mg | ORAL_TABLET | Freq: Every day | ORAL | Status: DC
  Administered 2021-10-27 – 2021-11-02 (×7): 10 mg via ORAL
  Filled 2021-10-26 (×8): qty 1

## 2021-10-26 MED ORDER — MAGNESIUM HYDROXIDE 400 MG/5ML PO SUSP: 30.0000 mL | Freq: Every day | ORAL | Status: DC | PRN

## 2021-10-26 MED ORDER — DULOXETINE HCL 30 MG PO CPEP: 30.0000 mg | ORAL_CAPSULE | Freq: Every day | ORAL | Status: DC

## 2021-10-26 MED ORDER — LORATADINE 10 MG PO TABS: 10.0000 mg | ORAL_TABLET | Freq: Every day | ORAL | Status: DC

## 2021-10-26 MED ORDER — SODIUM CHLORIDE 0.9 % IV BOLUS
1000.0000 mL | Freq: Once | INTRAVENOUS | Status: AC
  Administered 2021-10-26: 1000 mL via INTRAVENOUS

## 2021-10-26 MED ORDER — LORAZEPAM 1 MG PO TABS: 1.0000 mg | ORAL_TABLET | Freq: Four times a day (QID) | ORAL | Status: AC | PRN

## 2021-10-26 MED ORDER — LORAZEPAM 1 MG PO TABS: 0.0000 mg | ORAL_TABLET | Freq: Two times a day (BID) | ORAL | Status: DC

## 2021-10-26 MED ORDER — LORAZEPAM 2 MG/ML IJ SOLN: 0.0000 mg | Freq: Two times a day (BID) | INTRAMUSCULAR | Status: DC

## 2021-10-26 MED ORDER — ALUM & MAG HYDROXIDE-SIMETH 200-200-20 MG/5ML PO SUSP: 30.0000 mL | ORAL | Status: DC | PRN

## 2021-10-26 MED ORDER — TRAZODONE HCL 50 MG PO TABS
50.0000 mg | ORAL_TABLET | Freq: Every evening | ORAL | Status: DC | PRN
  Administered 2021-10-26 – 2021-11-01 (×7): 50 mg via ORAL
  Filled 2021-10-26 (×17): qty 1

## 2021-10-26 MED ORDER — GABAPENTIN 100 MG PO CAPS: 100.0000 mg | ORAL_CAPSULE | Freq: Three times a day (TID) | ORAL | Status: DC

## 2021-10-26 MED ORDER — THIAMINE HCL 100 MG PO TABS
100.0000 mg | ORAL_TABLET | Freq: Every day | ORAL | Status: DC
  Filled 2021-10-26: qty 1

## 2021-10-26 MED ORDER — HYDROXYZINE HCL 25 MG PO TABS: 25.0000 mg | ORAL_TABLET | Freq: Three times a day (TID) | ORAL | Status: DC | PRN

## 2021-10-26 MED ORDER — LORAZEPAM 2 MG/ML IJ SOLN: 0.0000 mg | Freq: Four times a day (QID) | INTRAMUSCULAR | Status: DC

## 2021-10-26 MED ORDER — ACETAMINOPHEN 325 MG PO TABS
650.0000 mg | ORAL_TABLET | Freq: Four times a day (QID) | ORAL | Status: DC | PRN
  Administered 2021-10-27 – 2021-10-29 (×2): 650 mg via ORAL
  Filled 2021-10-26 (×2): qty 2

## 2021-10-26 MED ORDER — LOPERAMIDE HCL 2 MG PO CAPS
2.0000 mg | ORAL_CAPSULE | ORAL | Status: AC | PRN
Start: 2021-10-26 — End: 2021-10-29

## 2021-10-26 MED ORDER — ONDANSETRON HCL 4 MG/2ML IJ SOLN
4.0000 mg | Freq: Once | INTRAMUSCULAR | Status: AC
  Administered 2021-10-26: 4 mg via INTRAVENOUS
  Filled 2021-10-26: qty 2

## 2021-10-26 MED ORDER — DULOXETINE HCL 60 MG PO CPEP: 90.0000 mg | ORAL_CAPSULE | Freq: Every day | ORAL | Status: DC

## 2021-10-26 MED ORDER — DULOXETINE HCL 60 MG PO CPEP: 60.0000 mg | ORAL_CAPSULE | Freq: Every day | ORAL | Status: DC

## 2021-10-26 MED ORDER — LORAZEPAM 1 MG PO TABS
0.0000 mg | ORAL_TABLET | Freq: Four times a day (QID) | ORAL | Status: DC
  Administered 2021-10-26: 1 mg via ORAL
  Filled 2021-10-26: qty 1

## 2021-10-26 MED ORDER — THIAMINE HCL 100 MG PO TABS
100.0000 mg | ORAL_TABLET | Freq: Every day | ORAL | Status: DC
  Administered 2021-10-27 – 2021-11-02 (×7): 100 mg via ORAL
  Filled 2021-10-26 (×8): qty 1

## 2021-10-26 MED ORDER — THIAMINE HCL 100 MG/ML IJ SOLN
100.0000 mg | Freq: Every day | INTRAMUSCULAR | Status: DC
  Administered 2021-10-26: 100 mg via INTRAVENOUS
  Filled 2021-10-26: qty 2

## 2021-10-26 NOTE — Progress Notes (Signed)
Patient is 60 yrs old, voluntary, has been under a lot of stress at work.  Travels four days per week, can't keep up, feels like a failure, overwhelmed for the past one yr.  Stated she works approximately 10 hours a day.  Four yr college education in communication.  [employer].  Has been drinking alcohol since age of 37 yrs old.  One brother died of suicide at age of 64 yrs old.  Entire family , parents, aunts, uncles drink alcohol..  Denied using THC, no heroin, no cocaine.  Has three children 26, 59, 81 yrs old.  Divorced and lives with boyfriend.  Daughter from [place] brought her to [facility] for treatment.  Stated she has attended many [place] meetings.  Fall risk information given and discussed with patient who stated she understood and had no questions.  High fall risk. Patient oriented to hallway, given food/drink.

## 2021-10-26 NOTE — ED Notes (Signed)
TTS telecommunication with patient

## 2021-10-26 NOTE — ED Notes (Signed)
Attempt to call report.

## 2021-10-26 NOTE — ED Triage Notes (Signed)
States going through ETOH withdraw.  States had two glasses this am due to being shaky.  States feeling shaky and heart racing

## 2021-10-26 NOTE — ED Notes (Signed)
Patient wanded by security.  Belongings placed in lock cabnet at nursing station.

## 2021-10-26 NOTE — ED Notes (Signed)
Report given to [facility] RN at [facility]

## 2021-10-26 NOTE — Progress Notes (Signed)
Pt accepted to [facility] 300-1    Patient meets inpatient criteria per Beatriz Stallion, NP  The attending provider will be Janine Limbo, MD   Call report to [phone]    Colin Ina, RN @ [place] notified.     Pt scheduled  to arrive at [facility] at 1500.   Mariea Clonts, MSW, LCSW-A  2:13 PM 10/26/2021

## 2021-10-26 NOTE — [facility] Counselor (Signed)
Requested cart

## 2021-10-26 NOTE — Tx Team (Signed)
Initial Treatment Plan 10/26/2021 6:46 PM Debra Rhodes [id number]:[id number]    PATIENT STRESSORS: Health problems   Medication change or noncompliance   Substance abuse     PATIENT STRENGTHS: Ability for insight  Capable of independent living  Motivation for treatment/growth    PATIENT IDENTIFIED PROBLEMS: Alcohol Abuse  Suicidal ideation  Depression  Ineffective coping skills               DISCHARGE CRITERIA:  Ability to meet basic life and health needs Adequate post-discharge living arrangements Motivation to continue treatment in a less acute level of care  PRELIMINARY DISCHARGE PLAN: Attend aftercare/continuing care group Outpatient therapy Return to previous living arrangement  PATIENT/FAMILY INVOLVEMENT: This treatment plan has been presented to and reviewed with the patient, Debra Rhodes, and/or family member.  The patient and family have been given the opportunity to ask questions and make suggestions.  Vela Prose, RN 10/26/2021, 6:46 PM

## 2021-10-26 NOTE — Progress Notes (Signed)
Patient did not attend group.

## 2021-10-26 NOTE — ED Provider Notes (Signed)
[facility] EMERGENCY DEPT Provider Note   CSN: [id number] Arrival date & time: 10/26/21  6803     History Chief Complaint  Patient presents with   Alcohol Intoxication   Suicidal    Pt endorsed suicidal ideation accompanied by alcohol use.  Pt stated that she had two to four glasses of wine this AM to ease withdrawal symptoms.    Debra Rhodes is a 60 y.o. female.  60 yo F with a chief complaints of suicidal ideation and concern for alcohol withdrawal.  The patient has had issues with alcohol in the past and has required hospitalization previously for alcohol.  She has a history of delirium tremens but no issues recently.  She has been having some nausea and vomiting over the past 24 hours or so.  Mild diffuse abdominal discomfort.  Has been able to continue to drink alcohol and had a couple glasses of wine this morning to improve her shakes and feeling like her heart was racing.  Denies fevers or chills denies cough or congestion.  She also has expressed some passive suicidality with this.  Tells me that she does not feel like she should continue living if she cannot stop drinking.  The history is provided by the patient.  Alcohol Intoxication Pertinent negatives include no chest pain, no headaches and no shortness of breath.  Illness Severity:  Moderate Onset quality:  Gradual Duration:  2 days Timing:  Constant Progression:  Worsening Chronicity:  Recurrent Associated symptoms: nausea and vomiting   Associated symptoms: no chest pain, no congestion, no fever, no headaches, no myalgias, no rhinorrhea, no shortness of breath and no wheezing       Past Medical History:  Diagnosis Date   Anxiety    Depression    PONV (postoperative nausea and vomiting)     Patient Active Problem List   Diagnosis Date Noted   Iron deficiency anemia 09/27/2021   Mixed hyperlipidemia 09/27/2021   History of alcohol abuse 09/27/2021   Elevated serum hCG 09/27/2021   Fatigue  09/27/2021   Suicidal ideation    Depression    Alcohol abuse 01/21/2021   MDD (major depressive disorder), recurrent severe, without psychosis ([facility]) 01/21/2021   Overweight (BMI 25.0-29.9) 05/14/2019   S/P right TKA 05/13/2019   Synovial cyst of knee 02/10/2019   Pain in right knee 02/04/2019    Past Surgical History:  Procedure Laterality Date   AUGMENTATION MAMMAPLASTY Bilateral 1996   BACK SURGERY     CESAREAN SECTION     FRACTURE SURGERY     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   Procedure: TOTAL KNEE ARTHROPLASTY;  Surgeon: Paralee Cancel, MD;  Location: [facility] ORS;  Service: Orthopedics;  Laterality: Right;  70 mins     OB History   No obstetric history on file.     Family History  Problem Relation Age of Onset   Anxiety disorder Sister    Depression Brother    Schizophrenia Brother    Suicidality Brother    Anxiety disorder Brother    Alcohol abuse Sister    Alcohol abuse Brother    Alcohol abuse Brother     Social History   Tobacco Use   Smoking status: Never   Smokeless tobacco: Never  Vaping Use   Vaping Use: Never used  Substance Use Topics   Alcohol use: Yes    Comment: binge drinking- was in recovery for past 4 years   Drug use: No    Home Medications Prior  to Admission medications   Medication Sig Start Date End Date Taking? Authorizing Provider  cetirizine (ZYRTEC) 10 MG tablet Take 10 mg by mouth daily.   Yes [provider]  Cyanocobalamin (VITAMIN B 12 PO) Take 1 tablet by mouth daily.   Yes [provider]  DULoxetine (CYMBALTA) 30 MG capsule Take 1 capsule (30 mg total) by mouth daily. Take with a 60 mg capsule to equal total daily dose of 90 mg 10/12/21 01/10/22 Yes Thayer Headings, PMHNP  DULoxetine (CYMBALTA) 60 MG capsule Take 1 capsule (60 mg total) by mouth daily. Take with a 30 mg capsule to equal total daily dose of 90 mg. 10/12/21  Yes Thayer Headings, PMHNP  estradiol-norethindrone [facility]) 0.05-0.14 MG/DAY  Place 1 patch onto the skin once a week.   Yes [provider]  gabapentin (NEURONTIN) 100 MG capsule Take 100 mg in the morning and 100 mg at noon, then take (2) capsules 200 mg in the evening. 10/12/21  Yes Thayer Headings, PMHNP  Lemborexant (DAYVIGO) 5 MG TABS Take 5 mg by mouth at bedtime. 10/31/21  Yes Thayer Headings, PMHNP  Methylphenidate HCl ER, PM, (JORNAY PM) 20 MG CP24 Take 20 mg by mouth every evening. 10/23/21  Yes Thayer Headings, PMHNP  Multiple Vitamin (MULTIVITAMIN) tablet Take 1 tablet by mouth daily.   Yes [provider]  sertraline (ZOLOFT) 100 MG tablet Decrease to 1.5 tabs daily for 5 days, then 1 tab daily for 5 days, then 1/2 tablet daily for 5 days, then stop 11/23/20 12/23/20  Thayer Headings, PMHNP    Allergies    Latex  Review of Systems   Review of Systems  Constitutional:  Negative for chills and fever.  HENT:  Negative for congestion and rhinorrhea.   Eyes:  Negative for redness and visual disturbance.  Respiratory:  Negative for shortness of breath and wheezing.   Cardiovascular:  Negative for chest pain and palpitations.  Gastrointestinal:  Positive for nausea and vomiting.  Genitourinary:  Negative for dysuria and urgency.  Musculoskeletal:  Negative for arthralgias and myalgias.  Skin:  Negative for pallor and wound.  Neurological:  Positive for tremors. Negative for dizziness and headaches.   Physical Exam Updated Vital Signs BP (!) 143/90   Pulse (!) 102   Temp 99 F (37.2 C)   Resp 20   Ht 5\' 4"  (1.626 m)   Wt 61.2 kg   SpO2 92%   BMI 23.17 kg/m   Physical Exam Vitals and nursing note reviewed.  Constitutional:      General: She is not in acute distress.    Appearance: She is well-developed. She is not diaphoretic.  HENT:     Head: Normocephalic and atraumatic.  Eyes:     Pupils: Pupils are equal, round, and reactive to light.  Cardiovascular:     Rate and Rhythm: Normal rate and regular rhythm.     Heart sounds: No  murmur heard.   No friction rub. No gallop.  Pulmonary:     Effort: Pulmonary effort is normal.     Breath sounds: No wheezing or rales.  Abdominal:     General: There is no distension.     Palpations: Abdomen is soft.     Tenderness: There is abdominal tenderness.     Comments: Mild diffuse tenderness worse to the epigatrium  Musculoskeletal:        General: No tenderness.     Cervical back: Normal range of motion and neck supple.  Skin:  General: Skin is warm and dry.  Neurological:     Mental Status: She is alert and oriented to person, place, and time.  Psychiatric:        Behavior: Behavior normal.    ED Results / Procedures / Treatments   Labs (all labs ordered are listed, but only abnormal results are displayed) Labs Reviewed  COMPREHENSIVE METABOLIC PANEL - Abnormal; Notable for the following components:      Result Value   Glucose, Bld 107 (*)    All other components within normal limits  RESP PANEL BY RT-PCR (FLU A&B, COVID) ARPGX2  CBC WITH DIFFERENTIAL/PLATELET  LIPASE, BLOOD    EKG EKG Interpretation  Date/Time:  Wednesday October 26 2021 08:02:56 EST Ventricular Rate:  99 PR Interval:  145 QRS Duration: 77 QT Interval:  345 QTC Calculation: 443 R Axis:   66 Text Interpretation: Sinus rhythm No significant change since last tracing Confirmed by Deno Etienne [id number]) on 10/26/2021 8:04:00 AM  Radiology No results found.  Procedures Procedures   Medications Ordered in ED Medications  LORazepam (ATIVAN) injection 0-4 mg (has no administration in time range)    Or  LORazepam (ATIVAN) tablet 0-4 mg (has no administration in time range)  LORazepam (ATIVAN) injection 0-4 mg (has no administration in time range)    Or  LORazepam (ATIVAN) tablet 0-4 mg (has no administration in time range)  thiamine tablet 100 mg ( Oral See Alternative 10/26/21 1141)    Or  thiamine (B-1) injection 100 mg (100 mg Intravenous Given 10/26/21 1141)  sodium chloride 0.9 %  bolus 1,000 mL (1,000 mLs Intravenous New Bag/Given 10/26/21 0830)  ondansetron (ZOFRAN) injection 4 mg (4 mg Intravenous Given 10/26/21 0837)  LORazepam (ATIVAN) injection 2 mg (2 mg Intravenous Given 10/26/21 0839)  chlordiazePOXIDE (LIBRIUM) capsule 100 mg (100 mg Oral Given 10/26/21 9833)    ED Course  I have reviewed the triage vital signs and the nursing notes.  Pertinent labs & imaging results that were available during my care of the patient were reviewed by me and considered in my medical decision making (see chart for details).    MDM Rules/Calculators/A&P                           60 yo F with a chief complaints of wanting to stop drinking alcohol.  The patient has a history of alcoholism and is required hospitalization for this before.  She denies any seizures denies hallucinations.  She did drink this morning to try and stop her tremors.  She has also had some nausea and vomiting and abdominal discomfort with this.  We will obtain lab work to assess for hepatitis or pancreatitis.  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  Will have TTS evaluate.  LFTs and lipase are unremarkable.  Feel patient is medically clear.  TTS recommends inpatient.   The patients results and plan were reviewed and discussed.   Any x-rays performed were independently reviewed by myself.   Differential diagnosis were considered with the presenting HPI.  Medications  LORazepam (ATIVAN) injection 0-4 mg (has no administration in time range)    Or  LORazepam (ATIVAN) tablet 0-4 mg (has no administration in time range)  LORazepam (ATIVAN) injection 0-4 mg (has no administration in time range)    Or  LORazepam (ATIVAN) tablet 0-4 mg (has no administration in time range)  thiamine tablet 100 mg ( Oral See Alternative 10/26/21 1141)    Or  thiamine (B-1) injection 100 mg (100 mg Intravenous Given 10/26/21 1141)  sodium chloride 0.9 % bolus 1,000 mL (1,000 mLs Intravenous New Bag/Given 10/26/21 0830)   ondansetron (ZOFRAN) injection 4 mg (4 mg Intravenous Given 10/26/21 0837)  LORazepam (ATIVAN) injection 2 mg (2 mg Intravenous Given 10/26/21 0839)  chlordiazePOXIDE (LIBRIUM) capsule 100 mg (100 mg Oral Given 10/26/21 0834)    Vitals:   10/26/21 0800 10/26/21 0801 10/26/21 0808  BP:  (!) 143/90 (!) 143/90  Pulse:  (!) 102 (!) 102  Resp:  20   Temp:  99 F (37.2 C)   SpO2:  92%   Weight: 61.2 kg    Height: 5\' 4"  (1.626 m)      Final diagnoses:  Suicidal ideation  Uncomplicated alcohol dependence (HCC)     Final Clinical Impression(s) / ED Diagnoses Final diagnoses:  Suicidal ideation  Uncomplicated alcohol dependence ([place])    Rx / DC Orders ED Discharge Orders     None        Deno Etienne, DO 10/26/21 1219
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
## 2021-10-26 NOTE — ED Notes (Signed)
With getting up for transport patient began having tremors  Ativan PO given,

## 2021-10-27 ENCOUNTER — Ambulatory Visit: Payer: BC Managed Care – PPO | Admitting: Psychiatry

## 2021-10-27 DIAGNOSIS — F411 Generalized anxiety disorder: Secondary | ICD-10-CM | POA: Diagnosis present

## 2021-10-27 DIAGNOSIS — F332 Major depressive disorder, recurrent severe without psychotic features: Principal | ICD-10-CM

## 2021-10-27 LAB — LIPID PANEL
Cholesterol: 296 mg/dL — ABNORMAL HIGH (ref 0–200)
HDL: 91 mg/dL (ref >40)
LDL Cholesterol: 175 mg/dL — ABNORMAL HIGH (ref 0–99)
Total CHOL/HDL Ratio: 3.3 RATIO
Triglycerides: 152 mg/dL — ABNORMAL HIGH (ref <150)
VLDL: 30 mg/dL (ref 0–40)

## 2021-10-27 LAB — URINALYSIS, COMPLETE (UACMP) WITH MICROSCOPIC
Bilirubin Urine: NEGATIVE
Glucose, UA: NEGATIVE mg/dL
Ketones, ur: 5 mg/dL — AB
Leukocytes,Ua: NEGATIVE
Nitrite: NEGATIVE
Protein, ur: NEGATIVE mg/dL
RBC / HPF: >50 RBC/hpf — ABNORMAL HIGH (ref 0–5)
Specific Gravity, Urine: 1.014 (ref 1.005–1.030)
pH: 6 (ref 5.0–8.0)

## 2021-10-27 LAB — HEMOGLOBIN A1C
Hgb A1c MFr Bld: 5.3 % (ref 4.8–5.6)
Mean Plasma Glucose: 105.41 mg/dL

## 2021-10-27 LAB — TSH: TSH: 2.322 u[IU]/mL (ref 0.350–4.500)

## 2021-10-27 MED ORDER — DULOXETINE HCL 60 MG PO CPEP
90.0000 mg | ORAL_CAPSULE | Freq: Every day | ORAL | Status: DC
  Administered 2021-10-27 – 2021-11-02 (×7): 90 mg via ORAL
  Filled 2021-10-27 (×11): qty 1

## 2021-10-27 MED ORDER — LORAZEPAM 1 MG PO TABS
1.0000 mg | ORAL_TABLET | Freq: Four times a day (QID) | ORAL | Status: AC
  Administered 2021-10-27 – 2021-10-28 (×3): 1 mg via ORAL
  Filled 2021-10-27 (×3): qty 1

## 2021-10-27 MED ORDER — LORAZEPAM 1 MG PO TABS
1.0000 mg | ORAL_TABLET | Freq: Three times a day (TID) | ORAL | Status: AC
  Administered 2021-10-28 – 2021-10-29 (×3): 1 mg via ORAL
  Filled 2021-10-27 (×3): qty 1

## 2021-10-27 MED ORDER — ONDANSETRON 4 MG PO TBDP: 4.0000 mg | ORAL_TABLET | Freq: Four times a day (QID) | ORAL | Status: AC | PRN

## 2021-10-27 MED ORDER — WHITE PETROLATUM EX OINT
TOPICAL_OINTMENT | CUTANEOUS | Status: AC
  Administered 2021-10-27: 1
  Filled 2021-10-27: qty 5

## 2021-10-27 MED ORDER — LORAZEPAM 1 MG PO TABS
1.0000 mg | ORAL_TABLET | Freq: Every day | ORAL | Status: AC
  Administered 2021-10-31: 1 mg via ORAL
  Filled 2021-10-27: qty 1

## 2021-10-27 MED ORDER — LORAZEPAM 1 MG PO TABS
1.0000 mg | ORAL_TABLET | Freq: Two times a day (BID) | ORAL | Status: AC
  Administered 2021-10-29 – 2021-10-30 (×2): 1 mg via ORAL
  Filled 2021-10-27 (×2): qty 1

## 2021-10-27 NOTE — Progress Notes (Signed)
Progress note  Pt found in bed; compliant with medication administration. Pt presents anxious, tremulous, and guarded. Pt is minimal with assessment. Pt has been resting since morning medication pass. Pt has c/o weakness and unsteadiness. Pt compliant with safety precautions. Pt denies si/hi/ah/vh and verbally agrees to approach staff if these become apparent or before harming themselves/others while at [facility].  A: Pt provided support and encouragement. Pt given medication per protocol and standing orders. Q33m safety checks implemented and continued.  R: Pt safe on the unit. Will continue to monitor.

## 2021-10-27 NOTE — Progress Notes (Signed)
   10/26/21 2146  Psych Admission Type (Psych Patients Only)  Admission Status Voluntary  Psychosocial Assessment  Patient Complaints Anxiety  Eye Contact Brief  Facial Expression Flat  Affect Sad  Speech Logical/coherent  Interaction Assertive  Motor Activity Slow  Appearance/Hygiene Unremarkable  Behavior Characteristics Cooperative;Anxious  Mood Anxious  Thought Process  Coherency WDL  Content WDL  Delusions WDL  Perception WDL  Hallucination None reported or observed  Judgment WDL  Confusion WDL  Danger to Self  Current suicidal ideation? Passive  Danger to Others  Danger to Others None reported or observed   D: Patient alert and oriented, rates current anxiety 5/10 and depression 7/10. Patient denies experiencing any pain at this time. Patient endorses passive SI w/o plan, denies any HI or AVH at this time. Current mood described as "Sacred".   A: Scheduled medications administered to patient as ordered. Support and encouragement provided. Frequent verbal contact made. Routine safety checks conducted q15 minutes.  R: No adverse drug reactions noted. Patient is agreeable to notifying staff with any safety concerns. Patient complaint with medications. Patient interacts minimally with others on the unit. Patient remains safe at this time. Will continue to monitor.

## 2021-10-27 NOTE — H&P (Addendum)
Psychiatric Admission Assessment Adult  Patient Identification: Debra Rhodes MRN:  [id number] Date of Evaluation:  10/27/2021 Chief Complaint:  MDD (major depressive disorder), recurrent severe, without psychosis ([place]) [F33.2] Principal Diagnosis: MDD (major depressive disorder), recurrent severe, without psychosis ([place]) Diagnosis:  Principal Problem:   MDD (major depressive disorder), recurrent severe, without psychosis ([place]) Active Problems:   Alcohol abuse   Suicidal ideation   Generalized anxiety disorder  History of Present Illness: Debra Rhodes is a 60 year old female with a psychiatric history of GAD, MDD-recurrent/severe/without psychosis and ADHD-inattentive type admitted voluntarily from [place] ED for worsening depression and SI with a plan to cut wrists.  On chart review, patient had a recent admission to [facility] in August 2022 for SI after relapsing on alcohol and receives outpatient follow-up for therapy and medication managed with [facility]. She was also admitted to [facility] from 3/1 to 01/24/2021 for a suicide attempt by cutting her wrist in the context of alcohol relapse. Patient stated she was sober for 5 months after completing a rehab stay at [facility] after she was discharged from [facility] in March. Most recently she states she was sober for 4 months after discharge from [facility] in August.  On assessment today, patient reports feeling overwhelmed with work. This is what led her to break her sobriety. Once she started drinking wine last week, she "could not stop."  She endorses drinking 2 bottles of wine per day for the 3 days prior to admission.  She reports that in the context of her relapse she has planned to harm herself or even kill herself by cutting her wrists.  She reports that her last suicide attempt was in August in which she had taken pills. She came to the hospital after being spotted buying alcohol by someone in her AA group who reached out to offer  assistance.  She endorses SI, but is able to contract for her safety on the unit.  She denies HI/AVH/paranoia and other first rank symptoms.  She does not voice delusions.  She endorses depressed mood with decreased energy, anhedonia, insomnia with increased desire to sleep (waking up with racing thoughts), poor concentration, and poor appetite worsening over the last several months.  She denies manic symptoms or any history of manic symptoms including elevated mood, impulsivity, hypersexuality, and decreased need for sleep.  She reports no trauma nor legal history.  She also reports no firearms in the home.  She reports that her daughters have been her support system, but believes that they are "done" with her.  She reports that her last drink was this morning.  She reports diaphoresis and chills and appears significantly tremulous on assessment. She denies other illicit drug use. She states she has most recently been managed on a combination of Cymbalta 90mg  daily for depression, Neurontin 100mg  qam and 200mg  qhs PRN anxiety, and Dayvigo for sleep. Her outpatient provider has also tried her on [place] or a Ritalin based stimulant recently for her ADD.  Associated Signs/Symptoms: Depression Symptoms:  depressed mood, anhedonia, insomnia, feelings of worthlessness/guilt, difficulty concentrating, hopelessness, suicidal thoughts with specific plan, anxiety, disturbed sleep, Duration of Depression Symptoms: Greater than two weeks  (Hypo) Manic Symptoms:   N/A Anxiety Symptoms:  Excessive Worry, Psychotic Symptoms:   N/A PTSD Symptoms: Negative  Total Time spent with patient: I personally spent 45 minutes on the unit in direct patient care. The direct patient care time included face-to-face time with the patient, reviewing the patient's chart, communicating with other professionals, and  coordinating care. Greater than 50% of this time was spent in counseling or coordinating care with the patient  regarding goals of hospitalization, psycho-education, and discharge planning needs.   Past Psychiatric History: From a previous admission 8/22: "Patient has a longstanding history of depression as well as alcohol dependence.  She has been to detox and rehab in the past.  She stated the last time she was in rehab was approximately 4 years ago.  She has had success with sobriety for an extended period of time with [facility].  She has been in treatment for depression with a nurse practitioner locally.  She has been on paroxetine for depression.  Previously we will attempt to switch her to sertraline, but she did worse with that.  She admitted to intermittent compliance with the paroxetine." She reports h/o ADD, MDD, and anxiety and most recently has been managed with Cymbalta 90mg  daily, Neurontin 100mg  qam and 200mg  qhs PRN, Dayvigo for sleep, and Ritalin or Jornay for ADD. She is followed with [facility] for med management and psychotherapy. She was admitted to [facility] in March and August 2022.   Is the patient at risk to self? Yes.    Has the patient been a risk to self in the past 6 months? Yes.    Has the patient been a risk to self within the distant past? Yes.    Is the patient a risk to others? No.  Has the patient been a risk to others in the past 6 months? No.  Has the patient been a risk to others within the distant past? No.   Prior Inpatient Therapy:  Yes Prior Outpatient Therapy:  Yes  Alcohol Screening: Patient refused Alcohol Screening Tool: Yes 1. How often do you have a drink containing alcohol?: 2 to 4 times a month 2. How many drinks containing alcohol do you have on a typical day when you are drinking?: 3 or 4 3. How often do you have six or more drinks on one occasion?: Less than monthly AUDIT-C Score: 4 4. How often during the last year have you found that you were not able to stop drinking once you had started?: Less than monthly 5. How often during the  last year have you failed to do what was normally expected from you because of drinking?: Never 6. How often during the last year have you needed a first drink in the morning to get yourself going after a heavy drinking session?: Less than monthly 7. How often during the last year have you had a feeling of guilt of remorse after drinking?: Never 8. How often during the last year have you been unable to remember what happened the night before because you had been drinking?: Never 9. Have you or someone else been injured as a result of your drinking?: No 10. Has a relative or friend or a doctor or another health worker been concerned about your drinking or suggested you cut down?: No Alcohol Use Disorder Identification Test Final Score (AUDIT): 6  Substance Abuse History in the last 12 months:  Yes.   Consequences of Substance Abuse: Medical Consequences:  Contributory to this hospital admission, alcohol intoxication with suicidal ideation Family Consequences:  Strain on relationship with daughters, shame Withdrawal Symptoms:   Diaphoresis Nausea Tremors Previous Psychotropic Medications: Yes  Psychological Evaluations: Yes  Past Medical History:  Past Medical History:  Diagnosis Date   Anxiety    Depression    PONV (postoperative nausea and vomiting)  Past Surgical History:  Procedure Laterality Date   AUGMENTATION MAMMAPLASTY Bilateral 1996   BACK SURGERY     CESAREAN SECTION     FRACTURE SURGERY     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  Father-substance issues.   Unspecified psychiatric problems with others in her family.  Tobacco Screening: Denies use  Social History: Has 3 adult daughters Divorced, lives alone Maintained sobriety for 4 months, relapsed 1 time last week, then for the past 3 days, including today. Works for a company called [employer], currently looking for other employment Denies access to firearms Denies legal issues Denies h/o physical/sexual/mental abuse  Social History   Substance and Sexual Activity  Alcohol Use Yes   Comment: binge drinking- was in recovery for past 4 years     Social History   Substance and Sexual Activity  Drug Use No    Additional Social History: Marital status: Divorced (In a relationship, on and off for a year and she is unsure how supportive he will be after learning she drank again) Divorced, when?: Pt reports divorce from her husband in 2015 Additional relationship information: NA Are you sexually active?: Yes What is your sexual orientation?: Heterosexual Has your sexual activity been affected by drugs, alcohol, medication, or emotional stress?: No How many children?: 3 How is patient's relationship with their children?: Three children, ages 23, 57, and 78.  Allergies:   Allergies  Allergen Reactions   Latex Shortness Of Breath, Swelling and Rash   Lab Results:  Results for orders placed or performed during the hospital encounter of 10/26/21 (from the past 48 hour(s))  Hemoglobin A1c     Status: None   Collection Time: 10/27/21  6:19 AM  Result Value Ref Range   Hgb A1c MFr Bld 5.3 4.8 - 5.6 %    Comment: (NOTE) Pre diabetes:          5.7%-6.4%  Diabetes:              >6.4%  Glycemic control for   <7.0% adults with diabetes    Mean Plasma Glucose 105.41 mg/dL    Comment: Performed at [facility] [address] [place], [place] [address]  Lipid panel      Status: Abnormal   Collection Time: 10/27/21  6:19 AM  Result Value Ref Range   Cholesterol 296 (H) 0 - 200 mg/dL   Triglycerides 152 (H) <150 mg/dL   HDL 91 >40 mg/dL   Total CHOL/HDL Ratio 3.3 RATIO   VLDL 30 0 - 40 mg/dL   LDL Cholesterol 175 (H) 0 - 99 mg/dL    Comment:        Total Cholesterol/HDL:CHD Risk Coronary Heart Disease Risk Table                     Men   Women  1/2 Average Risk   3.4  3.3  Average Risk       5.0   4.4  2 X Average Risk   9.6   7.1  3 X Average Risk  23.4   11.0        Use the calculated Patient Ratio above and the CHD Risk Table to determine the patient's CHD Risk.        ATP III CLASSIFICATION (LDL):  <100     mg/dL   Optimal  100-129  mg/dL   Near or Above                    Optimal  130-159  mg/dL   Borderline  160-189  mg/dL   High  >190     mg/dL   Very High Performed at [facility] [address], [place], [place] [address]   TSH     Status: None   Collection Time: 10/27/21  6:19 AM  Result Value Ref Range   TSH 2.322 0.350 - 4.500 uIU/mL    Comment: Performed by a 3rd Generation assay with a functional sensitivity of <=0.01 uIU/mL. Performed at [facility], [place] [address] [place], [place] [address]   Urinalysis, Complete w Microscopic     Status: Abnormal   Collection Time: 10/27/21  6:48 AM  Result Value Ref Range   Color, Urine YELLOW YELLOW   APPearance CLOUDY (A) CLEAR   Specific Gravity, Urine 1.014 1.005 - 1.030   pH 6.0 5.0 - 8.0   Glucose, UA NEGATIVE NEGATIVE mg/dL   Hgb urine dipstick MODERATE (A) NEGATIVE   Bilirubin Urine NEGATIVE NEGATIVE   Ketones, ur 5 (A) NEGATIVE mg/dL   Protein, ur NEGATIVE NEGATIVE mg/dL   Nitrite NEGATIVE NEGATIVE   Leukocytes,Ua NEGATIVE NEGATIVE   RBC / HPF >50 (H) 0 - 5 RBC/hpf   WBC, UA 11-20 0 - 5 WBC/hpf   Bacteria, UA RARE (A) NONE SEEN   Squamous Epithelial / LPF 21-50 0 - 5   Mucus PRESENT     Comment: Performed at [facility], [place] [address], [place],  [address]    Blood Alcohol level:  Lab Results  Component Value Date   ETH 167 (H) 10/26/2021   ETH <10 25/95/6387    Metabolic Disorder Labs:  Lab Results  Component Value Date   HGBA1C 5.3 10/27/2021   MPG 105.41 10/27/2021   MPG 102.54 01/19/2021   No results found for: PROLACTIN Lab Results  Component Value Date   CHOL 296 (H) 10/27/2021   TRIG 152 (H) 10/27/2021   HDL 91 10/27/2021   CHOLHDL 3.3 10/27/2021   VLDL 30 10/27/2021   LDLCALC 175 (H) 10/27/2021   LDLCALC 185 (H) 09/27/2021    Current Medications: Current Facility-Administered Medications  Medication Dose Route Frequency Provider Last Rate Last Admin   acetaminophen (TYLENOL) tablet 650 mg  650 mg Oral Q6H PRN Ethelene Hal, NP       alum & mag hydroxide-simeth (MAALOX/MYLANTA) 200-200-20 MG/5ML suspension 30 mL  30 mL Oral Q4H PRN Ethelene Hal, NP       DULoxetine (CYMBALTA) DR capsule 90 mg  90 mg Oral Daily Nelda Marseille, Judy Goodenow E, MD   90 mg at 10/27/21 1532   gabapentin (NEURONTIN) capsule 100 mg  100 mg Oral TID Ethelene Hal, NP   100 mg at 10/27/21 1314   hydrOXYzine (ATARAX) tablet 25 mg  25 mg Oral TID PRN Ethelene Hal, NP  loperamide (IMODIUM) capsule 2-4 mg  2-4 mg Oral PRN Ethelene Hal, NP       loratadine (CLARITIN) tablet 10 mg  10 mg Oral Daily Ethelene Hal, NP   10 mg at 10/27/21 0802   LORazepam (ATIVAN) tablet 1 mg  1 mg Oral Q6H PRN Ethelene Hal, NP       LORazepam (ATIVAN) tablet 1 mg  1 mg Oral QID Nelda Marseille, Khadejah Son E, MD   1 mg at 10/27/21 1532   Followed by   Derrill Memo ON 10/28/2021] LORazepam (ATIVAN) tablet 1 mg  1 mg Oral TID Harlow Asa, MD       Followed by   Derrill Memo ON 10/29/2021] LORazepam (ATIVAN) tablet 1 mg  1 mg Oral BID Nelda Marseille, Akiva Josey E, MD       Followed by   Derrill Memo ON 10/31/2021] LORazepam (ATIVAN) tablet 1 mg  1 mg Oral Daily Karimah Winquist E, MD        magnesium hydroxide (MILK OF MAGNESIA) suspension 30 mL  30 mL Oral Daily PRN Ethelene Hal, NP       multivitamin with minerals tablet 1 tablet  1 tablet Oral Daily Ethelene Hal, NP   1 tablet at 10/27/21 0802   ondansetron (ZOFRAN-ODT) disintegrating tablet 4 mg  4 mg Oral Q6H PRN Harlow Asa, MD       thiamine tablet 100 mg  100 mg Oral Daily Ethelene Hal, NP   100 mg at 10/27/21 0802   traZODone (DESYREL) tablet 50 mg  50 mg Oral QHS,MR X 1 Ethelene Hal, NP   50 mg at 10/26/21 2148   PTA Medications: Medications Prior to Admission  Medication Sig Dispense Refill Last Dose   cetirizine (ZYRTEC) 10 MG tablet Take 10 mg by mouth daily.      Cyanocobalamin (VITAMIN B 12 PO) Take 1 tablet by mouth daily.      DULoxetine (CYMBALTA) 30 MG capsule Take 1 capsule (30 mg total) by mouth daily. Take with a 60 mg capsule to equal total daily dose of 90 mg 90 capsule 0    DULoxetine (CYMBALTA) 60 MG capsule Take 1 capsule (60 mg total) by mouth daily. Take with a 30 mg capsule to equal total daily dose of 90 mg. 90 capsule 0    estradiol-norethindrone (COMBIPATCH) 0.05-0.14 MG/DAY Place 1 patch onto the skin once a week.      gabapentin (NEURONTIN) 100 MG capsule Take 100 mg in the morning and 100 mg at noon, then take (2) capsules 200 mg in the evening. 90 capsule 2    [START ON 10/31/2021] Lemborexant (DAYVIGO) 5 MG TABS Take 5 mg by mouth at bedtime. 30 tablet 2    Methylphenidate HCl ER, PM, (JORNAY PM) 20 MG CP24 Take 20 mg by mouth every evening. 30 capsule 0    Multiple Vitamin (MULTIVITAMIN) tablet Take 1 tablet by mouth daily.       Musculoskeletal: Strength & Muscle Tone:  UTA; patient tremulous Gait & Station: unsteady, ataxic 2/2 tremulousness Patient leans:  Toward arm holding her up while sitting in bed; difficulty sitting upright 2/2 tremulousness   Psychiatric Specialty Exam:  Presentation  General Appearance: Disheveled; Casual  (Diaphoretic)  Eye Contact:Fair (Patient lying in bed, looking between assessors and the ceiling)  Speech:Clear and Coherent; Normal Rate  Speech Volume:Normal  Handedness:Right   Mood and Affect  Mood:Dysphoric; Anxious (Overwhelmed)  Affect:Congruent; Depressed   Thought Process  Thought Processes:Coherent; Goal  Directed  Past Diagnosis of Schizophrenia or Psychoactive disorder: No  Descriptions of Associations:Intact  Orientation:Full (Time, Place and Person)  Thought Content:-- (Patient endorses SI, but able to contract for safety; denies HI/AVH, paranoia and other first rank symptoms.  Does not voice delusions nor appear to be internally preoccupied)  Hallucinations:Hallucinations: None Ideas of Reference:None  Suicidal Thoughts:Passive but able to contract for safety on the unit  Homicidal Thoughts:Homicidal Thoughts: No  Sensorium  Memory:Immediate Good; Recent Fair  Judgment:Impaired  Insight:Poor   Executive Functions  Concentration:Fair  Attention Span:Fair  [place]  Language:Good   Psychomotor Activity  Psychomotor Activity:Psychomotor Activity: Tremor  Assets  Assets:Desire for Improvement; Housing; Catering manager; Resilience; Social Support; Vocational/Educational   Sleep  Sleep:Sleep: Poor   Physical Exam Vitals and nursing note reviewed.  Constitutional:      Appearance: She is diaphoretic.  HENT:     Head: Normocephalic and atraumatic.     Mouth/Throat:     Mouth: Mucous membranes are moist.     Pharynx: Oropharynx is clear.  Cardiovascular:     Rate and Rhythm: Normal rate and regular rhythm.  Pulmonary:     Effort: Pulmonary effort is normal.     Breath sounds: Normal breath sounds.  Neurological:     Mental Status: She is alert and oriented to person, place, and time.     Comments: Tremulous   Review of Systems  Constitutional:  Positive for chills and diaphoresis.   HENT:  Negative for congestion.   Respiratory:  Negative for shortness of breath.   Cardiovascular:  Negative for chest pain and palpitations.  Gastrointestinal:  Positive for nausea. Negative for abdominal pain, constipation, diarrhea and vomiting.       Yesterday, resolved today  Genitourinary: Negative.   Musculoskeletal: Negative.   Blood pressure 119/70, pulse 91, temperature 98.3 F (36.8 C), temperature source Oral, resp. rate 19, height 5\' 4"  (1.626 m), weight 62.1 kg, SpO2 97 %. Body mass index is 23.52 kg/m.  Treatment Plan Summary: Daily contact with patient to assess and evaluate symptoms and progress in treatment and Medication management  Observation Level/Precautions:  15 minute checks  Laboratory: As below  Psychotherapy: Group and supportive psychotherapy  Medications: As below  Consultations: N/A  Discharge Concerns: Safe disposition  Estimated LOS: 5 to 7 days  Other: N/A   #MDD-recurrent/severe/without psychosis #GAD - Continue home Cymbalta 90 mg daily - Restart scheduled gabapentin 100 mg 3 times daily for anxiety and alcohol cravings - Continue trazodone 50 mg nightly and repeat x1 as needed  #Alcohol use disorder-severe/dependent Patient with profound tremulousness, diaphoresis, chills, and reported nausea. - Initiate gabapentin 100 mg 3 times daily for cravings and anxiety - Initiate Ativan taper with 1 mg 4 times daily, followed by 1 mg 3 times daily, followed by 1 mg twice daily, followed by 1 mg daily. - Continue CIWA monitoring with protocol for Ativan 1 mg every 6 hours for CIWA greater than 10 - Continue daily multivitamin and thiamine 100 mg    Medical Management Covid negative CMP: Unremarkable CBC: unremarkable EtOH: 167 UDS: Negative TSH: 2.322 A1C: 5.3% Lipids: Cholesterol 296, Tg 152, LDL 175  #Seasonal allergies - Continue home Claritin 10 mg daily  #Dyslipidemia Cholesterol 296, LDL 175 - Advised to follow up with PCP for  recommendations  #Abnormal UA (moderate blood, 5 ketones, >50 RBC, rare bacteria) - Repeat UA and urine culture pending  Physician Treatment Plan for Primary Diagnosis: MDD (major depressive disorder),  recurrent severe, without psychosis ([place]) Long Term Goal(s): Improvement in symptoms so as ready for discharge  Short Term Goals: Ability to identify changes in lifestyle to reduce recurrence of condition will improve, Ability to verbalize feelings will improve, Ability to disclose and discuss suicidal ideas, Ability to demonstrate self-control will improve, Ability to identify and develop effective coping behaviors will improve, Ability to maintain clinical measurements within normal limits will improve, and Compliance with prescribed medications will improve  Physician Treatment Plan for Secondary Diagnosis: Principal Problem:   MDD (major depressive disorder), recurrent severe, without psychosis ([place]) Active Problems:   Alcohol abuse   Suicidal ideation   Generalized anxiety disorder  Long Term Goal(s): Improvement in symptoms so as ready for discharge  Short Term Goals: Ability to identify changes in lifestyle to reduce recurrence of condition will improve, Ability to verbalize feelings will improve, Ability to disclose and discuss suicidal ideas, Ability to demonstrate self-control will improve, Ability to identify and develop effective coping behaviors will improve, Ability to maintain clinical measurements within normal limits will improve, Compliance with prescribed medications will improve, and Ability to identify triggers associated with substance abuse/mental health issues will improve  I certify that inpatient services furnished can reasonably be expected to improve the patient's condition.    Rosezetta Schlatter, MD 12/8/20225:31 PM

## 2021-10-27 NOTE — Progress Notes (Deleted)
This patient admitted to [facility] yesterday.  No session today.  No Charge. NC1

## 2021-10-27 NOTE — [facility] Group Notes (Signed)
Patient did not attend the relaxation group. 

## 2021-10-27 NOTE — Plan of Care (Signed)
  Problem: Education: Goal: Knowledge of [facility] Education information/materials will improve Outcome: Progressing Goal: Emotional status will improve Outcome: Progressing Goal: Mental status will improve Outcome: Progressing   
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
## 2021-10-27 NOTE — BHH Suicide Risk Assessment (Addendum)
Suicide Risk Assessment  Admission Assessment    [facility] Admission Suicide Risk Assessment   Nursing information obtained from:  Patient Demographic factors:  Living alone Current Mental Status:  Self-harm thoughts, active suicidal thoughts, guilt/shame about relapsing Loss Factors:  Financial problems / change in socioeconomic status, Decline in physical health Historical Factors:  Impulsivity Risk Reduction Factors:  Employed  Total Time spent with patient: 45 minutes Principal Problem: MDD (major depressive disorder), recurrent severe, without psychosis ([place]) Diagnosis:  Principal Problem:   MDD (major depressive disorder), recurrent severe, without psychosis ([place]) Active Problems:   Alcohol abuse   Suicidal ideation   Generalized anxiety disorder  Subjective Data: Debra Rhodes is a 60 year old female with a psychiatric history of GAD, MDD-recurrent/severe/without psychosis and ADHD-inattentive type admitted voluntarily from [place] ED for worsening depression and SI with a plan to cut wrists.  On chart review, patient had a recent admission to [facility] in August 2022 for SI after relapsing on alcohol receives outpatient follow-up for therapy and medication managed with [facility]. She was also admitted [facility] from 3/1 to 01/24/2021 for a suicide attempt by cutting her wrist. Patient stated she was sober for 5 months after completing a rehab stay at [facility] after she was discharged from [facility] in March.   On assessment today, patient reports feeling overwhelmed with work. This is what led her to break her sobriety. Once she started drinking wine last week, she "could not stop."  She endorses drinking 2 bottles of wine per day.  She reports that with this amount of alcohol, she has planned to harm herself or even kill herself by cutting her wrists.  She reports that her last suicide attempt was in August in which she had taken pills. She came to the hospital after being spotted  buying alcohol by someone in her AA group.  She endorses SI, but is able to contract for her safety on the unit.  She denies HI/AVH/paranoia and other first rank symptoms.  She does not voice delusions.  She endorses decreased energy, anhedonia, insomnia (waking up with racing thoughts), poor concentration, and poor appetite.  She denies manic symptoms or any history of manic symptoms including elevated mood, impulsivity, hypersexuality, and decreased need for sleep.  She reports no trauma nor legal history.  She also reports no firearms in the home.  She reports that her daughters have been her support system, but believes that they are "done" with her.  She reports that her last drink was this morning.  She reports diaphoresis and chills and appears significantly tremulous on assessment.  Continued Clinical Symptoms:  Alcohol Use Disorder Identification Test Final Score (AUDIT): 6 The "Alcohol Use Disorders Identification Test", Guidelines for Use in Primary Care, Second Edition.  World Pharmacologist [facility]). Score between 0-7:  no or low risk or alcohol related problems. Score between 8-15:  moderate risk of alcohol related problems. Score between 16-19:  high risk of alcohol related problems. Score 20 or above:  warrants further diagnostic evaluation for alcohol dependence and treatment.   CLINICAL FACTORS:   Severe Anxiety and/or Agitation Depression:   Anhedonia Comorbid alcohol abuse/dependence Hopelessness Insomnia Severe Alcohol/Substance Abuse/Dependencies More than one psychiatric diagnosis Previous Psychiatric Diagnoses and Treatments   Musculoskeletal: Strength & Muscle Tone:  UTA; patient tremulous Gait & Station: unsteady, ataxic 2/2 tremulousness Patient leans:  Toward arm holding her up while sitting in bed; difficulty sitting upright 2/2 tremulousness  Psychiatric Specialty Exam:   Presentation  General Appearance:  Disheveled; Casual (Diaphoretic)   Eye  Contact:Fair (Patient lying in bed, looking between assessors and the ceiling)   Speech:Clear and Coherent; Normal Rate   Speech Volume:Normal   Handedness:Right     Mood and Affect  Mood:Dysphoric; Anxious (Overwhelmed)   Affect:Congruent; Depressed     Thought Process  Thought Processes:Coherent; Goal Directed   Past Diagnosis of Schizophrenia or Psychoactive disorder: No   Descriptions of Associations:Intact   Orientation:Full (Time, Place and Person)   Thought Content:-- (Patient endorses SI, but able to contract for safety; denies HI/AVH, paranoia and other first rank symptoms.  Does not voice delusions nor appear to be internally preoccupied)   Hallucinations:Hallucinations: None Ideas of Reference:None   Suicidal Thoughts:Passive but able to contract for safety on the unit   Homicidal Thoughts:Homicidal Thoughts: No   Sensorium  Memory:Immediate Good; Recent Fair   Judgment:Impaired   Insight:Poor     Executive Functions  Concentration:Fair   Attention Span:Fair   [place]   Language:Good     Psychomotor Activity  Psychomotor Activity:Psychomotor Activity: Tremor   Assets  Assets:Desire for Improvement; Housing; Catering manager; Resilience; Social Support; Vocational/Educational     Sleep  Sleep:Sleep: Poor     Physical Exam: Physical Exam Vitals and nursing note reviewed.  Constitutional:      Appearance: She is toxic-appearing and diaphoretic.  HENT:     Head: Normocephalic and atraumatic.     Mouth/Throat:     Mouth: Mucous membranes are moist.     Pharynx: Oropharynx is clear.  Cardiovascular:     Rate and Rhythm: Normal rate and regular rhythm.  Pulmonary:     Effort: Pulmonary effort is normal.     Breath sounds: Normal breath sounds.  Neurological:     Mental Status: She is alert and oriented to person, place, and time.     Coordination: Coordination abnormal.     Gait: Gait  abnormal.     Comments: Tremulous    Review of Systems  Constitutional:  Positive for chills and diaphoresis.  HENT:  Negative for congestion.   Respiratory:  Negative for shortness of breath.   Cardiovascular:  Negative for chest pain and palpitations.  Gastrointestinal:  Positive for nausea. Negative for abdominal pain, constipation, diarrhea and vomiting.       Yesterday, resolved today  Genitourinary: Negative.   Musculoskeletal: Negative.   Blood pressure 119/70, pulse 91, temperature 98.3 F (36.8 C), temperature source Oral, resp. rate 19, height 5\' 4"  (1.626 m), weight 62.1 kg, SpO2 97 %. Body mass index is 23.52 kg/m.   COGNITIVE FEATURES THAT CONTRIBUTE TO RISK:  Polarized thinking    SUICIDE RISK:   Severe:  Frequent, intense, and enduring suicidal ideation, specific plan, no subjective intent, but some objective markers of intent (i.e., choice of lethal method), the method is accessible, some limited preparatory behavior, evidence of impaired self-control, severe dysphoria/symptomatology, multiple risk factors present, and few if any protective factors, particularly a lack of social support.  PLAN OF CARE: #MDD-recurrent/severe/without psychosis #GAD - Continue home Cymbalta 90 mg daily - Continue gabapentin 100 mg 3 times daily for anxiety and alcohol cravings - Continue trazodone 50 mg nightly and repeat x1 as needed  #Alcohol use disorder-severe/dependent Patient with profound tremulousness, diaphoresis, chills, and reported nausea. - Initiate gabapentin 100 mg 3 times daily for cravings and anxiety - Initiate Ativan taper with 1 mg 4 times daily, followed by 1 mg 3 times daily, followed by 1 mg twice  daily, followed by 1 mg daily. - Continue CIWA monitoring with protocol for Ativan 1 mg every 6 hours for CIWA greater than 10 - Continue daily multivitamin and thiamine 100 mg    Medical Management Covid negative CMP: Unremarkable CBC: unremarkable EtOH:  167 UDS: Negative TSH: 2.322 A1C: 5.3% Lipids: Cholesterol 296, Tg 152, LDL 175   #Seasonal allergies - Continue home Claritin 10 mg daily  #Dyslipidemia Cholesterol 296, LDL 175 - Advised to follow up with PCP for recommendations   Physician Treatment Plan for Primary Diagnosis: MDD (major depressive disorder), recurrent severe, without psychosis ([place]) Long Term Goal(s): Improvement in symptoms so as ready for discharge   Short Term Goals: Ability to identify changes in lifestyle to reduce recurrence of condition will improve, Ability to verbalize feelings will improve, Ability to disclose and discuss suicidal ideas, Ability to demonstrate self-control will improve, Ability to identify and develop effective coping behaviors will improve, Ability to maintain clinical measurements within normal limits will improve, and Compliance with prescribed medications will improve   Physician Treatment Plan for Secondary Diagnosis: Principal Problem:   MDD (major depressive disorder), recurrent severe, without psychosis ([place]) Active Problems:   Alcohol abuse   Suicidal ideation   Generalized anxiety disorder   Long Term Goal(s): Improvement in symptoms so as ready for discharge   Short Term Goals: Ability to identify changes in lifestyle to reduce recurrence of condition will improve, Ability to verbalize feelings will improve, Ability to disclose and discuss suicidal ideas, Ability to demonstrate self-control will improve, Ability to identify and develop effective coping behaviors will improve, Ability to maintain clinical measurements within normal limits will improve, Compliance with prescribed medications will improve, and Ability to identify triggers associated with substance abuse/mental health issues will improve    I certify that inpatient services furnished can reasonably be expected to improve the patient's condition.   Rosezetta Schlatter, MD 10/27/2021, 6:18 PM

## 2021-10-27 NOTE — BHH Suicide Risk Assessment (Signed)
[place] INPATIENT:  Family/Significant Other Suicide Prevention Education  Suicide Prevention Education:  Education Completed; Debra Rhodes, boyfriend,  [phone] (name of family member/significant other) has been identified by the patient as the family member/significant other with whom the patient will be residing, and identified as the person(s) who will aid the patient in the event of a mental health crisis (suicidal ideations/suicide attempt).  With written consent from the patient, the family member/significant other has been provided the following suicide prevention education, prior to the and/or following the discharge of the patient.  Spoke with patient boyfriend, Debra Rhodes, who reports that patient seemed fine when he last saw her on Tuesday and is unsure what triggered relapse and worsening depression. Debra Rhodes reports that patient always will discussed feeling overwhelmed with her job but that wasn't anything out of the norm.  Boyfriend suspects patient relapsed on Monday after patient became more distant and stopped answering phone calls.  Boyfriend confirmed no access to guns/firearms.  Boyfriend unsure of what the next steps for treatment are but reports when patient starts drinking it usually ends with her in the hospital.   The suicide prevention education provided includes the following: Suicide risk factors Suicide prevention and interventions National Suicide Hotline telephone number [facility] assessment telephone number [facility] Emergency Assistance [place] and/or Residential Mobile Crisis Unit telephone number  Request made of family/significant other to: Remove weapons (e.g., guns, rifles, knives), all items previously/currently identified as safety concern.   Remove drugs/medications (over-the-counter, prescriptions, illicit drugs), all items previously/currently identified as a safety concern.  The family member/significant other verbalizes understanding of  the suicide prevention education information provided.  The family member/significant other agrees to remove the items of safety concern listed above.  Debra Rhodes E Debra Rhodes 10/27/2021, 2:55 PM

## 2021-10-27 NOTE — Progress Notes (Signed)
[facility] Group Notes:  (Nursing/MHT/Case Management/Adjunct)  Date:  10/27/2021  Time:  2020  Type of Therapy:   wrap up group  Participation Level:  Active  Participation Quality:  Appropriate, Attentive, Sharing, and Supportive  Affect:  Depressed  Cognitive:  Alert  Insight:  Improving  Engagement in Group:  Engaged  Modes of Intervention:  Clarification, Education, and Support  Summary of Progress/Problems: Positive thinking and positive change were discussed.   [name] 10/27/2021, 8:52 PM

## 2021-10-28 ENCOUNTER — Encounter (HOSPITAL_COMMUNITY): Payer: Self-pay

## 2021-10-28 LAB — URINALYSIS, COMPLETE (UACMP) WITH MICROSCOPIC
Bilirubin Urine: NEGATIVE
Glucose, UA: NEGATIVE mg/dL
Ketones, ur: 5 mg/dL — AB
Leukocytes,Ua: NEGATIVE
Nitrite: NEGATIVE
Protein, ur: NEGATIVE mg/dL
Specific Gravity, Urine: 1.009 (ref 1.005–1.030)
pH: 7 (ref 5.0–8.0)
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
## 2021-10-28 NOTE — Group Note (Signed)
Recreation Therapy Group Note   Group Topic:Stress Management  Group Date: 10/28/2021 Start Time: 0930 End Time: 0949 Facilitators: Victorino Sparrow, [place] Location: [address] Dayroom  Goal Area(s) Addresses:  Patient will identify positive stress management techniques. Patient will identify benefits of using stress management post d/c.  Group Description:  Meditation.  LRT played a meditation that focused on setting boundaries with the people around you.  Meditation emphasized the point of saying no to things and the wishes of others is ok in order to put your feelings and desires first.      Affect/Mood: Appropriate   Participation Level: Active   Participation Quality: Independent   Behavior: Appropriate   Speech/Thought Process: Focused   Insight: Good   Judgement: Good   Modes of Intervention: Meditation   Patient Response to Interventions:  Engaged   Education Outcome:  Acknowledges education and In group clarification offered    Clinical Observations/Individualized Feedback: Pt attended and participated in group.    Plan: Continue to engage patient in RT group sessions 2-3x/week.   Victorino Sparrow, LRT,CTRS  10/28/2021 10:57 AM

## 2021-10-28 NOTE — Progress Notes (Signed)
   10/27/21 2023  Psych Admission Type (Psych Patients Only)  Admission Status Voluntary  Psychosocial Assessment  Patient Complaints Anxiety;Depression  Eye Contact Brief  Facial Expression Anxious;Sullen;Sad  Affect Anxious;Depressed;Sad;Sullen  Speech Logical/coherent  Interaction Assertive  Motor Activity Slow  Appearance/Hygiene Unremarkable  Behavior Characteristics Cooperative;Calm;Anxious;Appropriate to situation  Mood Anxious;Depressed  Thought Process  Coherency Concrete thinking  Content WDL  Delusions WDL  Perception WDL  Hallucination None reported or observed  Judgment Poor  Confusion None  Danger to Self  Current suicidal ideation? Passive  Self-Injurious Behavior No self-injurious ideation or behavior indicators observed or expressed   Danger to Others  Danger to Others None reported or observed   D: Pt alert and oriented. Pt rates her anxiety 7/10 and depression 9/10 at time of assessment 2023. Pt denies experiencing any pain at this time. Pt endorses passive SI, denies HI, or AVH, and describes mood as "Hopeless".   A: Scheduled medications administered to pt, per MD orders. Tylenol 650 mg po prn given at 2129 per patient request for c/o back ache 5/10. Support and encouragement provided. Frequent verbal contact made. Routine safety checks conducted q15 minutes.  R: No adverse drug reactions noted. Pt is agreeable to notifying staff with any safety concerns. Pt complaint with medications. Pt interacts minimally with others on the unit. Pt remains safe at this time. Will continue to monitor. During pain reassessment, patient was observed resting comfortably in bed w/ eyes closed, no apparent distress noted.

## 2021-10-28 NOTE — Progress Notes (Signed)
Patient did attend the evening speaker AA meeting.  

## 2021-10-28 NOTE — Plan of Care (Signed)
  Problem: Education: Goal: Verbalization of understanding the information provided will improve Outcome: Progressing   Problem: Activity: Goal: Interest or engagement in activities will improve Outcome: Progressing Goal: Sleeping patterns will improve Outcome: Progressing   

## 2021-10-28 NOTE — Progress Notes (Addendum)
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  Chart Review of Past 24 hrs: The patient's chart was reviewed and nursing notes were reviewed. The patient's case was discussed in multidisciplinary team meeting.  Per MAR: - Patient is compliant with scheduled meds. - PRNs: Tylenol x1 Per RN notes, no documented behavioral issues and is attending group. Patient sleep hours not reported  Patient had the following psychiatric recommendations yesterday:  - Continue home Cymbalta 90 mg daily - Restart scheduled gabapentin 100 mg 3 times daily for anxiety and alcohol cravings - Continue trazodone 50 mg nightly and repeat x1 as needed - Initiate Ativan taper with 1 mg 4 times daily, followed by 1 mg 3 times daily, followed by 1 mg twice daily, followed by 1 mg daily. - Continue CIWA monitoring with protocol for Ativan 1 mg every 6 hours for CIWA greater than 10 - Continue daily multivitamin and thiamine 100 mg   On Today's Assessment (10/28/2021): Case was discussed in the multidisciplinary team. MAR was reviewed and patient was compliant with medications. Patient seen, assessed, and discussed with attending Dr. Nelda Marseille.  She reports that she is improved in physical sx today although still "wobbly and unstable," slept well last night, and appetite is good.  However, in terms of mood, she remains dysphoric reporting that she is "scared, ashamed, and with no idea how to survive."  She is afraid of going home alone, as she knows that if stressed and lonely, she will have the desire to drink.  She says that she would like for her boyfriend to stay with her however she believes that he wants to break up with her.  Her anxiety level  is "really high" today.  She reports SI but is able to contract for safety on the unit; she endorses that this morning she briefly thought to cut herself with a plastic fork at breakfast, but quickly dispelled the idea.  She denies HI, AVH, first rank symptoms, ideas of reference, or paranoia on the unit.  Of substance use treatment beyond, she is forward thinking" open for anything."  Principal Problem: MDD (major depressive disorder), recurrent severe, without psychosis ([place]) Diagnosis: Principal Problem:   MDD (major depressive disorder), recurrent severe, without psychosis ([place]) Active Problems:   Alcohol abuse   Suicidal ideation   Generalized anxiety disorder  Total Time spent with patient: I personally spent 35 minutes on the unit in direct patient care. The direct patient care time included face-to-face time with the patient, reviewing the patient's chart, communicating with other professionals, and coordinating care. Greater than 50% of this time was spent in counseling or coordinating care with the patient regarding goals of hospitalization, psycho-education, and discharge planning needs.   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   Procedure: TOTAL KNEE ARTHROPLASTY;  Surgeon: Paralee Cancel, MD;  Location: [facility];  Service: Orthopedics;  Laterality: Right;  70 mins   Family History:  Family History  Problem Relation Age of Onset   Anxiety disorder Sister    Depression Brother    Schizophrenia Brother    Suicidality Brother    Anxiety disorder Brother    Alcohol abuse Sister    Alcohol abuse Brother    Alcohol abuse Brother    Family Psychiatric   History: See above Social History:  Social History   Substance and Sexual Activity  Alcohol Use Yes   Comment: binge drinking- was in recovery for past 4 years     Social History   Substance and Sexual Activity  Drug Use No    Social History   Socioeconomic History   Marital status: Divorced    Spouse name: Not on file   Number of children: Not on file   Years of education: Not on file   Highest education level: Not on file  Occupational History   Not on file  Tobacco Use   Smoking status: Never   Smokeless tobacco: Never  Vaping Use   Vaping Use: Never used  Substance and Sexual Activity   Alcohol use: Yes    Comment: binge drinking- was in recovery for past 4 years   Drug use: No   Sexual activity: Not Currently  Other Topics Concern   Not on file  Social History Narrative   Not on file   Social Determinants of Health   Financial Resource Strain: Not on file  Food Insecurity: Not on file  Transportation Needs: Not on file  Physical Activity: Not on file  Stress: Not on file  Social Connections: Not on file   Additional Social History:         Sleep: Good  Appetite:  Good  Current Medications: Current Facility-Administered Medications  Medication Dose Route Frequency Provider Last Rate Last Admin   acetaminophen (TYLENOL) tablet 650 mg  650 mg Oral Q6H PRN Ethelene Hal, NP   650 mg at 10/27/21 2129   alum & mag hydroxide-simeth (MAALOX/MYLANTA) 200-200-20 MG/5ML suspension 30 mL  30 mL Oral Q4H PRN Ethelene Hal, NP       DULoxetine (CYMBALTA) DR capsule 90 mg  90 mg Oral Daily Nelda Marseille, Karisa Nesser E, MD   90 mg at 10/27/21 1532   gabapentin (NEURONTIN) capsule 100 mg  100 mg Oral TID Ethelene Hal, NP   100 mg at 10/27/21 2128   hydrOXYzine (ATARAX) tablet 25 mg  25 mg Oral TID PRN Ethelene Hal, NP       loperamide (IMODIUM) capsule 2-4 mg  2-4 mg Oral PRN Ethelene Hal, NP       loratadine (CLARITIN) tablet 10 mg   10 mg Oral Daily Ethelene Hal, NP   10 mg at 10/27/21 0802   LORazepam (ATIVAN) tablet 1 mg  1 mg Oral Q6H PRN Ethelene Hal, NP       LORazepam (ATIVAN) tablet 1 mg  1 mg Oral QID Nelda Marseille, Elaria Osias E, MD   1 mg at 10/27/21 2022   Followed by   LORazepam (ATIVAN) tablet 1 mg  1 mg Oral TID Harlow Asa, MD       Followed by   Derrill Memo ON 10/29/2021] LORazepam (ATIVAN) tablet 1 mg  1 mg Oral BID Nelda Marseille, Cleo Santucci E, MD       Followed by   Derrill Memo ON 10/31/2021] LORazepam (ATIVAN) tablet 1 mg  1 mg Oral Daily Araya Roel E,  MD       magnesium hydroxide (MILK OF MAGNESIA) suspension 30 mL  30 mL Oral Daily PRN Ethelene Hal, NP       multivitamin with minerals tablet 1 tablet  1 tablet Oral Daily Ethelene Hal, NP   1 tablet at 10/27/21 0802   ondansetron (ZOFRAN-ODT) disintegrating tablet 4 mg  4 mg Oral Q6H PRN Harlow Asa, MD       thiamine tablet 100 mg  100 mg Oral Daily Ethelene Hal, NP   100 mg at 10/27/21 0802   traZODone (DESYREL) tablet 50 mg  50 mg Oral QHS,MR X 1 Ethelene Hal, NP   50 mg at 10/27/21 2128    Lab Results:  Results for orders placed or performed during the hospital encounter of 10/26/21 (from the past 48 hour(s))  Hemoglobin A1c     Status: None   Collection Time: 10/27/21  6:19 AM  Result Value Ref Range   Hgb A1c MFr Bld 5.3 4.8 - 5.6 %    Comment: (NOTE) Pre diabetes:          5.7%-6.4%  Diabetes:              >6.4%  Glycemic control for   <7.0% adults with diabetes    Mean Plasma Glucose 105.41 mg/dL    Comment: Performed at [facility], [place] [address] [place], [place] [address]  Lipid panel     Status: Abnormal   Collection Time: 10/27/21  6:19 AM  Result Value Ref Range   Cholesterol 296 (H) 0 - 200 mg/dL   Triglycerides 152 (H) <150 mg/dL   HDL 91 >40 mg/dL   Total CHOL/HDL Ratio 3.3 RATIO   VLDL 30 0 - 40 mg/dL   LDL Cholesterol 175 (H) 0 - 99 mg/dL    Comment:        Total  Cholesterol/HDL:CHD Risk Coronary Heart Disease Risk Table                     Men   Women  1/2 Average Risk   3.4   3.3  Average Risk       5.0   4.4  2 X Average Risk   9.6   7.1  3 X Average Risk  23.4   11.0        Use the calculated Patient Ratio above and the CHD Risk Table to determine the patient's CHD Risk.        ATP III CLASSIFICATION (LDL):  <100     mg/dL   Optimal  100-129  mg/dL   Near or Above                    Optimal  130-159  mg/dL   Borderline  160-189  mg/dL   High  >190     mg/dL   Very High Performed at [facility] [address] [place], [place] [address]   TSH     Status: None   Collection Time: 10/27/21  6:19 AM  Result Value Ref Range   TSH 2.322 0.350 - 4.500 uIU/mL    Comment: Performed by a 3rd Generation assay with a functional sensitivity of <=0.01 uIU/mL. Performed at [facility], [place] [address], [place], [place] [address]   Urinalysis, Complete w Microscopic     Status: Abnormal   Collection Time: 10/27/21  6:48 AM  Result Value Ref Range  Color, Urine YELLOW YELLOW   APPearance CLOUDY (A) CLEAR   Specific Gravity, Urine 1.014 1.005 - 1.030   pH 6.0 5.0 - 8.0   Glucose, UA NEGATIVE NEGATIVE mg/dL   Hgb urine dipstick MODERATE (A) NEGATIVE   Bilirubin Urine NEGATIVE NEGATIVE   Ketones, ur 5 (A) NEGATIVE mg/dL   Protein, ur NEGATIVE NEGATIVE mg/dL   Nitrite NEGATIVE NEGATIVE   Leukocytes,Ua NEGATIVE NEGATIVE   RBC / HPF >50 (H) 0 - 5 RBC/hpf   WBC, UA 11-20 0 - 5 WBC/hpf   Bacteria, UA RARE (A) NONE SEEN   Squamous Epithelial / LPF 21-50 0 - 5   Mucus PRESENT     Comment: Performed at [facility], [place] [address] [place], [place] [address]  Urinalysis, Complete w Microscopic Urine, Clean Catch     Status: Abnormal   Collection Time: 10/27/21  7:07 PM  Result Value Ref Range   Color, Urine YELLOW YELLOW   APPearance HAZY (A) CLEAR   Specific Gravity, Urine 1.009 1.005 -  1.030   pH 7.0 5.0 - 8.0   Glucose, UA NEGATIVE NEGATIVE mg/dL   Hgb urine dipstick SMALL (A) NEGATIVE   Bilirubin Urine NEGATIVE NEGATIVE   Ketones, ur 5 (A) NEGATIVE mg/dL   Protein, ur NEGATIVE NEGATIVE mg/dL   Nitrite NEGATIVE NEGATIVE   Leukocytes,Ua NEGATIVE NEGATIVE   RBC / HPF 0-5 0 - 5 RBC/hpf   WBC, UA 0-5 0 - 5 WBC/hpf   Bacteria, UA RARE (A) NONE SEEN   Squamous Epithelial / LPF 11-20 0 - 5   Mucus PRESENT     Comment: Performed at [facility], [place] [address] [place], [place] [address]    Blood Alcohol level:  Lab Results  Component Value Date   ETH 167 (H) 10/26/2021   ETH <10 44/31/5400    Metabolic Disorder Labs: Lab Results  Component Value Date   HGBA1C 5.3 10/27/2021   MPG 105.41 10/27/2021   MPG 102.54 01/19/2021   No results found for: PROLACTIN Lab Results  Component Value Date   CHOL 296 (H) 10/27/2021   TRIG 152 (H) 10/27/2021   HDL 91 10/27/2021   CHOLHDL 3.3 10/27/2021   VLDL 30 10/27/2021   LDLCALC 175 (H) 10/27/2021   LDLCALC 185 (H) 09/27/2021    Physical Findings: AIMS: Facial and Oral Movements Muscles of Facial Expression: None, normal Lips and Perioral Area: None, normal Jaw: None, normal Tongue: None, normal,Extremity Movements Upper (arms, wrists, hands, fingers): None, normal Lower (legs, knees, ankles, toes): None, normal, Trunk Movements Neck, shoulders, hips: None, normal, Overall Severity Severity of abnormal movements (highest score from questions above): None, normal Incapacitation due to abnormal movements: None, normal Patient's awareness of abnormal movements (rate only patient's report): No Awareness, Dental Status Current problems with teeth and/or dentures?: No Does patient usually wear dentures?: No  CIWA:  CIWA-Ar Total: 5     Musculoskeletal: Strength & Muscle Tone: within normal limits Gait & Station: unsteady, 2/2 tremulousness Patient leans: N/A  Psychiatric Specialty  Exam:  Presentation  General Appearance: Casually dressed, improved hygiene  Eye Contact:Fair  Speech:Clear and Coherent; Normal Rate  Speech Volume:Normal  Handedness:Right   Mood and Affect  Mood:Dysphoric; Anxious (Overwhelmed)  Affect:Congruent; Depressed   Thought Process  Thought Processes:Coherent; Goal Directed  Descriptions of Associations:Intact  Orientation:Full (Time, Place and Person)  Thought Content:-- (Patient endorses SI, but able to contract for safety; denies HI/AVH, paranoia and other first rank symptoms.  Does  not voice delusions nor appear to be internally preoccupied)  History of Schizophrenia/Schizoaffective disorder:No  Duration of Psychotic Symptoms:NA Hallucinations:Hallucinations: None  Ideas of Reference:None  Suicidal Thoughts:SI with thoughts to hurt self with fork earlier today but denies current SI and can contract for safety on the unit  Homicidal Thoughts:Homicidal Thoughts: No   Sensorium  Memory:Immediate Good; Recent Fair  Judgment:Impaired  Insight:Poor   Executive Functions  Concentration:Fair  Attention Span:Fair  [place]  Language:Good   Psychomotor Activity  Psychomotor Activity:Psychomotor Activity: Tremor   Assets  Assets:Desire for Improvement; Housing; Catering manager; Resilience; Social Support; Vocational/Educational   Sleep  Time unrecorded   Physical Exam Vitals and nursing note reviewed.  Constitutional:      Appearance: Normal appearance.     Comments: No longer diaphoretic, remains tremulous but less so  HENT:     Head: Normocephalic and atraumatic.  Pulmonary:     Effort: Pulmonary effort is normal.  Neurological:     General: No focal deficit present.     Mental Status: She is alert.   Review of Systems  HENT:  Negative for congestion.   Respiratory:  Negative for shortness of breath.   Cardiovascular:  Negative for chest pain.   Gastrointestinal:  Positive for abdominal pain and nausea. Negative for constipation, diarrhea, heartburn and vomiting.  Genitourinary: Negative.   Musculoskeletal: Negative.   Neurological:  Positive for dizziness, tremors and weakness. Negative for tingling and headaches.       "Wobbly and unstable"  Blood pressure 99/65, pulse 96, temperature 97.6 F (36.4 C), resp. rate 18, height 5\' 4"  (1.626 m), weight 62.1 kg, SpO2 100 %. Body mass index is 23.52 kg/m.   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 - Initiate gabapentin 100 mg 3 times daily for cravings and anxiety - Initiate Ativan taper with 1 mg 4 times daily, followed by 1 mg 3 times daily, followed by 1 mg twice daily, followed by 1 mg daily (ends12/12) - Continue CIWA monitoring with protocol for Ativan 1 mg every 6 hours for CIWA greater than 10 - Continue daily multivitamin and thiamine 100 mg -- encouraged her to consider residential rehab or SAIOP after discharge   Medical Management Covid negative CMP: Unremarkable CBC: unremarkable EtOH: 167 UDS: Negative TSH: 2.322 A1C: 5.3% Lipids: Cholesterol 296, Tg 152, LDL 175   #Seasonal allergies - Continue home Claritin 10 mg daily  #Dyslipidemia Cholesterol 296, LDL 175 - Advised to follow up with PCP for recommendations   #Abnormal UA (moderate blood, 5 ketones, >50 RBC, rare bacteria) Appears to have RBCs in urine chronically - Repeat UA with small hemoglobin, and rare bacteria;  urine culture pending -We will advise to follow up with PCP for recommendations  Discharge Planning:              -- Social work and case management to assist with discharge planning and identification of hospital follow-up needs prior to discharge.              -- Estimated LOS: 5-7 days             --  Discharge Concerns: Need to establish a safety plan; Medication compliance and effectiveness             -- Discharge Goals: Return home with outpatient referrals for mental health follow-up including medication management/psychotherapy/sober living options/inpatient substance use rehabilitation.  SW to provide resources.  Rosezetta Schlatter, MD 10/28/2021, 7:26 AM

## 2021-10-28 NOTE — Group Note (Signed)
LCSW Group Therapy Note   Group Date: 10/28/2021 Start Time: 1300 End Time: 1400  Topic: Coping Skills  Due to the acuity and a high number of new admissions, group was not held. Patient was provided therapeutic worksheets and asked to meet with CSW as needed.   Mliss Fritz, LCSWA 10/28/2021  12:54 PM

## 2021-10-28 NOTE — BH IP Treatment Plan (Addendum)
Interdisciplinary Treatment and Diagnostic Plan Update  10/28/2021 KAHLANI GRABER MRN: [id number]  Principal Diagnosis: MDD (major depressive disorder), recurrent severe, without psychosis ([place])  Secondary Diagnoses: Principal Problem:   MDD (major depressive disorder), recurrent severe, without psychosis ([place]) Active Problems:   Alcohol abuse   Suicidal ideation   Generalized anxiety disorder   Current Medications:  Current Facility-Administered Medications  Medication Dose Route Frequency Provider Last Rate Last Admin   acetaminophen (TYLENOL) tablet 650 mg  650 mg Oral Q6H PRN Ethelene Hal, NP   650 mg at 10/27/21 2129   alum & mag hydroxide-simeth (MAALOX/MYLANTA) 200-200-20 MG/5ML suspension 30 mL  30 mL Oral Q4H PRN Ethelene Hal, NP       DULoxetine (CYMBALTA) DR capsule 90 mg  90 mg Oral Daily Viann Fish E, MD   90 mg at 10/28/21 0755   gabapentin (NEURONTIN) capsule 100 mg  100 mg Oral TID Ethelene Hal, NP   100 mg at 10/28/21 1132   hydrOXYzine (ATARAX) tablet 25 mg  25 mg Oral TID PRN Ethelene Hal, NP       loperamide (IMODIUM) capsule 2-4 mg  2-4 mg Oral PRN Ethelene Hal, NP       loratadine (CLARITIN) tablet 10 mg  10 mg Oral Daily Ethelene Hal, NP   10 mg at 10/28/21 0755   LORazepam (ATIVAN) tablet 1 mg  1 mg Oral Q6H PRN Ethelene Hal, NP       LORazepam (ATIVAN) tablet 1 mg  1 mg Oral QID Nelda Marseille, Amy E, MD   1 mg at 10/28/21 4765   Followed by   LORazepam (ATIVAN) tablet 1 mg  1 mg Oral TID Viann Fish E, MD   1 mg at 10/28/21 1132   Followed by   Derrill Memo ON 10/29/2021] LORazepam (ATIVAN) tablet 1 mg  1 mg Oral BID Nelda Marseille, Amy E, MD       Followed by   Derrill Memo ON 10/31/2021] LORazepam (ATIVAN) tablet 1 mg  1 mg Oral Daily Singleton, Amy E, MD       magnesium hydroxide (MILK OF MAGNESIA) suspension 30 mL  30 mL Oral Daily PRN Ethelene Hal, NP       multivitamin with minerals tablet 1  tablet  1 tablet Oral Daily Ethelene Hal, NP   1 tablet at 10/28/21 0755   ondansetron (ZOFRAN-ODT) disintegrating tablet 4 mg  4 mg Oral Q6H PRN Viann Fish E, MD       thiamine tablet 100 mg  100 mg Oral Daily Ethelene Hal, NP   100 mg at 10/28/21 0755   traZODone (DESYREL) tablet 50 mg  50 mg Oral QHS,MR X 1 Ethelene Hal, NP   50 mg at 10/27/21 2128   PTA Medications: Medications Prior to Admission  Medication Sig Dispense Refill Last Dose   cetirizine (ZYRTEC) 10 MG tablet Take 10 mg by mouth daily.      Cyanocobalamin (VITAMIN B 12 PO) Take 1 tablet by mouth daily.      DULoxetine (CYMBALTA) 30 MG capsule Take 1 capsule (30 mg total) by mouth daily. Take with a 60 mg capsule to equal total daily dose of 90 mg 90 capsule 0    DULoxetine (CYMBALTA) 60 MG capsule Take 1 capsule (60 mg total) by mouth daily. Take with a 30 mg capsule to equal total daily dose of 90 mg. 90 capsule 0    estradiol-norethindrone (COMBIPATCH) 0.05-0.14 MG/DAY Place  1 patch onto the skin once a week.      gabapentin (NEURONTIN) 100 MG capsule Take 100 mg in the morning and 100 mg at noon, then take (2) capsules 200 mg in the evening. 90 capsule 2    [START ON 10/31/2021] Lemborexant (DAYVIGO) 5 MG TABS Take 5 mg by mouth at bedtime. 30 tablet 2    Methylphenidate HCl ER, PM, (JORNAY PM) 20 MG CP24 Take 20 mg by mouth every evening. 30 capsule 0    Multiple Vitamin (MULTIVITAMIN) tablet Take 1 tablet by mouth daily.       Patient Stressors: Health problems   Medication change or noncompliance   Substance abuse    Patient Strengths: Ability for insight  Capable of independent living  Motivation for treatment/growth   Treatment Modalities: Medication Management, Group therapy, Case management,  1 to 1 session with clinician, Psychoeducation, Recreational therapy.   Physician Treatment Plan for Primary Diagnosis: MDD (major depressive disorder), recurrent severe, without psychosis  ([place]) Long Term Goal(s): Improvement in symptoms so as ready for discharge   Short Term Goals: Ability to identify changes in lifestyle to reduce recurrence of condition will improve Ability to verbalize feelings will improve Ability to disclose and discuss suicidal ideas Ability to demonstrate self-control will improve Ability to identify and develop effective coping behaviors will improve Ability to maintain clinical measurements within normal limits will improve Compliance with prescribed medications will improve Ability to identify triggers associated with substance abuse/mental health issues will improve  Medication Management: Evaluate patient's response, side effects, and tolerance of medication regimen.  Therapeutic Interventions: 1 to 1 sessions, Unit Group sessions and Medication administration.  Evaluation of Outcomes: Not Met  Physician Treatment Plan for Secondary Diagnosis: Principal Problem:   MDD (major depressive disorder), recurrent severe, without psychosis ([place]) Active Problems:   Alcohol abuse   Suicidal ideation   Generalized anxiety disorder  Long Term Goal(s): Improvement in symptoms so as ready for discharge   Short Term Goals: Ability to identify changes in lifestyle to reduce recurrence of condition will improve Ability to verbalize feelings will improve Ability to disclose and discuss suicidal ideas Ability to demonstrate self-control will improve Ability to identify and develop effective coping behaviors will improve Ability to maintain clinical measurements within normal limits will improve Compliance with prescribed medications will improve Ability to identify triggers associated with substance abuse/mental health issues will improve     Medication Management: Evaluate patient's response, side effects, and tolerance of medication regimen.  Therapeutic Interventions: 1 to 1 sessions, Unit Group sessions and Medication administration.  Evaluation  of Outcomes: Not Met   RN Treatment Plan for Primary Diagnosis: MDD (major depressive disorder), recurrent severe, without psychosis ([place]) Long Term Goal(s): Knowledge of disease and therapeutic regimen to maintain health will improve  Short Term Goals: Ability to demonstrate self-control, Ability to participate in decision making will improve, and Ability to verbalize feelings will improve  Medication Management: RN will administer medications as ordered by provider, will assess and evaluate patient's response and provide education to patient for prescribed medication. RN will report any adverse and/or side effects to prescribing provider.  Therapeutic Interventions: 1 on 1 counseling sessions, Psychoeducation, Medication administration, Evaluate responses to treatment, Monitor vital signs and CBGs as ordered, Perform/monitor CIWA, COWS, AIMS and Fall Risk screenings as ordered, Perform wound care treatments as ordered.  Evaluation of Outcomes: Not Met   LCSW Treatment Plan for Primary Diagnosis: MDD (major depressive disorder), recurrent severe, without psychosis ([place]) Long  Term Goal(s): Safe transition to appropriate next level of care at discharge, Engage patient in therapeutic group addressing interpersonal concerns.  Short Term Goals: Engage patient in aftercare planning with referrals and resources, Increase ability to appropriately verbalize feelings, and Increase emotional regulation  Therapeutic Interventions: Assess for all discharge needs, 1 to 1 time with Social worker, Explore available resources and support systems, Assess for adequacy in community support network, Educate family and significant other(s) on suicide prevention, Complete Psychosocial Assessment, Interpersonal group therapy.  Evaluation of Outcomes: Not Met   Progress in Treatment: Attending groups: Yes. Participating in groups: Yes. Taking medication as prescribed: Yes. Toleration medication:  Yes. Family/Significant other contact made: Yes, individual(s) contacted:  boyfriend Patient understands diagnosis: Yes. Discussing patient identified problems/goals with staff: Yes. Medical problems stabilized or resolved: Yes. Denies suicidal/homicidal ideation: Yes. Issues/concerns per patient self-inventory: No. Other: None  New problem(s) identified: No, Describe:  None  New Short Term/Long Term Goal(s):medication stabilization, elimination of SI thoughts, development of comprehensive mental wellness plan.   Patient Goals: "Manage my anxiety and want to live."  Discharge Plan or Barriers: Pt will continue therapy at [facility].  Reason for Continuation of Hospitalization: Anxiety Medication stabilization Withdrawal symptoms  Estimated Length of Stay: 3-5 days   Scribe for Treatment Team: Eliott Nine 10/28/2021 11:57 AM

## 2021-10-29 LAB — URINE CULTURE: Culture: NO GROWTH

## 2021-10-29 NOTE — Progress Notes (Signed)
Patient has been up in the dayroom watching tv.. She attended group. Writer spoke with her 1:1 and informed her of medication scheduled. She reports that she would like to attend an outpatient treatment center once discharged.

## 2021-10-29 NOTE — [facility] Group Notes (Signed)
Adult Psychoeducational Group Note  Date:  10/29/2021 Time:  8:28 PM  Group Topic/Focus:  Wrap-Up Group:   The focus of this group is to help patients review their daily goal of treatment and discuss progress on daily workbooks.  Participation Level:  Active  Participation Quality:  Appropriate and Attentive  Affect:  Appropriate  Cognitive:  Alert and Appropriate  Insight: Appropriate and Good  Engagement in Group:  Engaged  Modes of Intervention:  Discussion and Education  Additional Comments:  Pt attended and participated in wrap up group this evening. Pt rated their day a 7/10 due to them enjoying their AM group. Pt stated that they enjoyed speaking about their emotions in group. The pt shared that they had great interactions with the Dr and their Nurse. Pt goal was to work on positive thinking and tomorrow they would like to work on gratitude and action.    Cristi Loron 10/29/2021, 8:28 PM

## 2021-10-29 NOTE — Progress Notes (Signed)
   10/29/21 0900  Psych Admission Type (Psych Patients Only)  Admission Status Voluntary  Psychosocial Assessment  Patient Complaints Depression;Anxiety;Worrying  Eye Contact Fair  Facial Expression Sullen;Sad;Worried  Affect Depressed;Sad;Sullen  Speech Logical/coherent  Interaction Assertive  Motor Activity Slow  Appearance/Hygiene Unremarkable  Behavior Characteristics Cooperative;Appropriate to situation;Anxious  Mood Depressed;Anxious;Preoccupied;Sad  Thought Pension scheme manager thinking  Content Ambivalence  Delusions None reported or observed  Perception WDL  Hallucination None reported or observed  Judgment Poor  Confusion None  Danger to Self  Current suicidal ideation? Passive  Self-Injurious Behavior Some self-injurious ideation observed or expressed.  No lethal plan expressed   Agreement Not to Harm Self Yes  Description of Agreement verbally agrees to approach staff before harming self at [place] to Others  Danger to Others None reported or observed

## 2021-10-29 NOTE — Progress Notes (Addendum)
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  Chart Review of Past 24 hrs: The patient's chart was reviewed and nursing notes were reviewed. The patient's case was discussed in multidisciplinary team meeting.  Per MAR: - Patient is compliant with scheduled meds. - PRNs: Tylenol x1 Per RN notes, no documented behavioral issues and is attending group. Patient sleep hours not reported  Patient had the following psychiatric recommendations yesterday:  - Continue home Cymbalta 90 mg daily -Continue gabapentin 100 mg 3 times daily for anxiety and alcohol cravings - Continue trazodone 50 mg nightly and repeat x1 as needed -Continue Ativan taper with 1 mg 4 times daily, followed by 1 mg 3 times daily, followed by 1 mg twice daily, followed by 1 mg daily (ends 12/12) - Continue CIWA monitoring with protocol for Ativan 1 mg every 6 hours for CIWA greater than 10 - Continue daily multivitamin and thiamine 100 mg   On Today's Assessment (10/29/2021): Case was discussed in the multidisciplinary team. MAR was reviewed and patient was compliant with medications. Patient seen, assessed, and discussed with attending Dr. Nelda Marseille.  She again reports that she is improved in physical sx today, no longer feeling unstable.  She slept well last night, and her appetite is good.  However, in terms of mood, she notes a slight improvement, but continues to report fear of the uncertain.  She believes that her boyfriend will break-up with her during visitation tonight, and she does not want to continue working her current job, so she fears not having a financial source to support herself.  She feels as though her options are limited.  She  denies SI, HI, AVH, first rank symptoms, ideas of reference, or paranoia on the unit today.  She reports somatic symptoms of a pressured occipital headache radiating into the left side of her neck, but denies all other somatic complaints. 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  We discussed purpose and nontraditional ways to carry out your passion, and she was appreciative.  Principal Problem: MDD (major depressive disorder), recurrent severe, without psychosis ([place]) Diagnosis: Principal Problem:   MDD (major depressive disorder), recurrent severe, without psychosis ([place]) Active Problems:   Alcohol abuse   Suicidal ideation   Generalized anxiety disorder  Total Time spent with patient: I personally spent 35 minutes on the unit in direct patient care. The direct patient care time included face-to-face time with the patient, reviewing the patient's chart, communicating with other professionals, and coordinating care. Greater than 50% of this time was spent in counseling or coordinating care with the patient regarding goals of hospitalization, psycho-education, and discharge planning needs.   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   Procedure: TOTAL KNEE ARTHROPLASTY;  Surgeon: Paralee Cancel, MD;  Location: [facility];  Service: Orthopedics;  Laterality: Right;  70 mins   Family History:  Family History  Problem Relation Age of Onset   Anxiety disorder Sister    Depression Brother    Schizophrenia Brother    Suicidality Brother    Anxiety disorder  Brother    Alcohol abuse Sister    Alcohol abuse Brother    Alcohol abuse Brother    Family Psychiatric  History: See above Social History:  Social History   Substance and Sexual Activity  Alcohol Use Yes   Comment: binge drinking- was in recovery for past 4 years     Social History   Substance and Sexual Activity  Drug Use No    Social History   Socioeconomic History   Marital status: Divorced    Spouse name: Not on file   Number of children: Not on file   Years of education: Not on file   Highest education level: Not on file  Occupational History   Not on file  Tobacco Use   Smoking status: Never   Smokeless tobacco: Never  Vaping Use   Vaping Use: Never used  Substance and Sexual Activity   Alcohol use: Yes    Comment: binge drinking- was in recovery for past 4 years   Drug use: No   Sexual activity: Not Currently  Other Topics Concern   Not on file  Social History Narrative   Not on file   Social Determinants of Health   Financial Resource Strain: Not on file  Food Insecurity: Not on file  Transportation Needs: Not on file  Physical Activity: Not on file  Stress: Not on file  Social Connections: Not on file   Additional Social History:         Sleep: Good  Appetite:  Good  Current Medications: Current Facility-Administered Medications  Medication Dose Route Frequency Provider Last Rate Last Admin   acetaminophen (TYLENOL) tablet 650 mg  650 mg Oral Q6H PRN Ethelene Hal, NP   650 mg at 10/27/21 2129   alum & mag hydroxide-simeth (MAALOX/MYLANTA) 200-200-20 MG/5ML suspension 30 mL  30 mL Oral Q4H PRN Ethelene Hal, NP       DULoxetine (CYMBALTA) DR capsule 90 mg  90 mg Oral Daily Nelda Marseille, Franz Svec E, MD   90 mg at 10/28/21 0755   gabapentin (NEURONTIN) capsule 100 mg  100 mg Oral TID Ethelene Hal, NP   100 mg at 10/28/21 1613   hydrOXYzine (ATARAX) tablet 25 mg  25 mg Oral TID PRN Ethelene Hal, NP       loperamide  (IMODIUM) capsule 2-4 mg  2-4 mg Oral PRN Ethelene Hal, NP       loratadine (CLARITIN) tablet 10 mg  10 mg Oral Daily Ethelene Hal, NP   10 mg at 10/28/21 0755   LORazepam (ATIVAN) tablet 1 mg  1 mg Oral Q6H PRN Ethelene Hal, NP       LORazepam (ATIVAN) tablet 1 mg  1 mg Oral TID Viann Fish E, MD   1 mg at 10/28/21 1613   Followed by   LORazepam (ATIVAN) tablet 1 mg  1 mg Oral BID Harlow Asa, MD       Followed by   Derrill Memo ON 10/31/2021] LORazepam (ATIVAN) tablet 1 mg  1 mg Oral Daily Daven Montz E, MD       magnesium hydroxide (MILK OF MAGNESIA) suspension 30 mL  30 mL Oral Daily PRN Romilda Garret,  Billey Chang, NP       multivitamin with minerals tablet 1 tablet  1 tablet Oral Daily Ethelene Hal, NP   1 tablet at 10/28/21 0755   ondansetron (ZOFRAN-ODT) disintegrating tablet 4 mg  4 mg Oral Q6H PRN Harlow Asa, MD       thiamine tablet 100 mg  100 mg Oral Daily Ethelene Hal, NP   100 mg at 10/28/21 0755   traZODone (DESYREL) tablet 50 mg  50 mg Oral QHS,MR X 1 Ethelene Hal, NP   50 mg at 10/28/21 2156    Lab Results:  Results for orders placed or performed during the hospital encounter of 10/26/21 (from the past 48 hour(s))  Urinalysis, Complete w Microscopic Urine, Clean Catch     Status: Abnormal   Collection Time: 10/27/21  7:07 PM  Result Value Ref Range   Color, Urine YELLOW YELLOW   APPearance HAZY (A) CLEAR   Specific Gravity, Urine 1.009 1.005 - 1.030   pH 7.0 5.0 - 8.0   Glucose, UA NEGATIVE NEGATIVE mg/dL   Hgb urine dipstick SMALL (A) NEGATIVE   Bilirubin Urine NEGATIVE NEGATIVE   Ketones, ur 5 (A) NEGATIVE mg/dL   Protein, ur NEGATIVE NEGATIVE mg/dL   Nitrite NEGATIVE NEGATIVE   Leukocytes,Ua NEGATIVE NEGATIVE   RBC / HPF 0-5 0 - 5 RBC/hpf   WBC, UA 0-5 0 - 5 WBC/hpf   Bacteria, UA RARE (A) NONE SEEN   Squamous Epithelial / LPF 11-20 0 - 5   Mucus PRESENT     Comment: Performed at [facility], [place] [address] [place], [place] [address]    Blood Alcohol level:  Lab Results  Component Value Date   ETH 167 (H) 10/26/2021   ETH <10 99/83/3825    Metabolic Disorder Labs: Lab Results  Component Value Date   HGBA1C 5.3 10/27/2021   MPG 105.41 10/27/2021   MPG 102.54 01/19/2021   No results found for: PROLACTIN Lab Results  Component Value Date   CHOL 296 (H) 10/27/2021   TRIG 152 (H) 10/27/2021   HDL 91 10/27/2021   CHOLHDL 3.3 10/27/2021   VLDL 30 10/27/2021   LDLCALC 175 (H) 10/27/2021   LDLCALC 185 (H) 09/27/2021    Physical Findings: AIMS: Facial and Oral Movements Muscles of Facial Expression: None, normal Lips and Perioral Area: None, normal Jaw: None, normal Tongue: None, normal,Extremity Movements Upper (arms, wrists, hands, fingers): None, normal Lower (legs, knees, ankles, toes): None, normal, Trunk Movements Neck, shoulders, hips: None, normal, Overall Severity Severity of abnormal movements (highest score from questions above): None, normal Incapacitation due to abnormal movements: None, normal Patient's awareness of abnormal movements (rate only patient's report): No Awareness, Dental Status Current problems with teeth and/or dentures?: No Does patient usually wear dentures?: No  CIWA:  CIWA-Ar Total: 0     Musculoskeletal: Strength & Muscle Tone: within normal limits Gait & Station: Less unsteady, less tremulous Patient leans: N/A  Psychiatric Specialty Exam:  Presentation  General Appearance: Casually dressed, improved hygiene  Eye Contact:Fair  Speech:Clear and Coherent; Normal Rate  Speech Volume:Normal  Handedness:Right   Mood and Affect  Mood:Dysphoric; Anxious (Overwhelmed)  Affect:Congruent; Depressed Tearful  Thought Process  Thought Processes:Coherent; Goal Directed  Descriptions of Associations:Intact  Orientation:Full (Time, Place and Person)  Thought Content:-- (Patient endorses SI, but able to  contract for safety; denies HI/AVH, paranoia and other first rank symptoms.  Does not voice delusions nor appear to be internally  preoccupied)  History of Schizophrenia/Schizoaffective disorder:No  Duration of Psychotic Symptoms:NA Hallucinations: Denies  Ideas of Reference:None  Suicidal Thoughts:denies current SI and can contract for safety on the unit  Homicidal Thoughts: Denies   Sensorium  Memory:Immediate Good; Recent Fair  Judgment:Impaired  Insight:Fair   Executive Functions  Concentration:Fair  Attention Span:Fair  [place]  Language:Good   Psychomotor Activity  Psychomotor Activity: Less tremulous   Assets  Assets:Desire for Improvement; Housing; Catering manager; Resilience; Social Support; Vocational/Educational   Sleep  Time unrecorded   Physical Exam Vitals and nursing note reviewed.  Constitutional:      Appearance: Normal appearance.     Comments: No longer diaphoretic, remains tremulous but less so  HENT:     Head: Normocephalic and atraumatic.  Pulmonary:     Effort: Pulmonary effort is normal.  Neurological:     General: No focal deficit present.     Mental Status: She is alert.   Review of Systems  HENT:  Negative for congestion.   Respiratory:  Negative for shortness of breath.   Cardiovascular:  Negative for chest pain.  Gastrointestinal:  Negative for abdominal pain, constipation, diarrhea, heartburn, nausea and vomiting.  Genitourinary: Negative.   Musculoskeletal: Negative.   Neurological:  Positive for tremors. Negative for dizziness, tingling, weakness and headaches.  Blood pressure 101/79, pulse 99, temperature (!) 97 F (36.1 C), resp. rate 16, height 5\' 4"  (1.626 m), weight 62.1 kg, SpO2 100 %. Body mass index is 23.52 kg/m.   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 -Continue gabapentin 100 mg 3 times daily for cravings and anxiety -Continue Ativan taper with 1 mg 4 times daily, followed by 1 mg 3 times daily, followed by 1 mg twice daily, followed by 1 mg daily (ends12/12) - Continue CIWA monitoring with protocol for Ativan 1 mg every 6 hours for CIWA greater than 10 (recent scores 1, 0, 4) - Continue daily multivitamin and thiamine 100 mg -- encouraged her to consider residential rehab or SAIOP after discharge   Medical Management Covid negative CMP: Unremarkable CBC: unremarkable EtOH: 167 UDS: Negative TSH: 2.322 A1C: 5.3% Lipids: Cholesterol 296, Tg 152, LDL 175   #Seasonal allergies - Continue home Claritin 10 mg daily  #Dyslipidemia Cholesterol 296, LDL 175 - Advised to follow up with PCP for recommendations   #Abnormal UA (moderate blood, 5 ketones, >50 RBC, rare bacteria) 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              -- Estimated LOS: 5-7 days             -- Discharge Concerns: Need  to establish a safety plan; Medication compliance and effectiveness             --  Discharge Goals: Return home with outpatient referrals for mental health follow-up including medication management/psychotherapy/sober living options/inpatient substance use rehabilitation.  SW to provide resources.  Rosezetta Schlatter, MD 10/29/2021, 7:06 AM

## 2021-10-29 NOTE — [facility] Group Notes (Signed)
.  Psychoeducational Group Note    Date:10/29/2021 Time: 1300-1400    Purpose of Group: . The group focus' on teaching patients on how to identify their needs and their Life Skills:  A group where two lists are made. What people need and what are things that we do that are unhealthy. The lists are developed by the patients and it is explained that we often do the actions that are not healthy to get our list of needs met.  Goal:: to develop the coping skills needed to get their needs met  Participation Level:  Active  Participation Quality:  Appropriate  Affect:  Appropriate  Cognitive:  Oriented  Insight:  Improving  Engagement in Group:  Engaged  Additional Comments: Rates her energy at  5/10. Was  interactive in the group, Gave others feedback as well as received it.Marveen Reeks, Elnora Morrison

## 2021-10-29 NOTE — [facility] Group Notes (Signed)
Goals Group 10/29/21    Group Focus: affirmation, clarity of thought, and goals/reality orientation Treatment Modality:  Psychoeducation Interventions utilized were assignment, group exercise, and support Purpose: To be able to understand and verbalize the reason for their admission to the hospital. To understand that the medication helps with their chemical imbalance but they also need to work on their choices in life. To be challenged to develop a list of 30 positives about themselves. Also introduce the concept that "feelings" are not reality.  Participation Level:  Active  Participation Quality:  Appropriate  Affect:  Appropriate  Cognitive:  Appropriate  Insight:  Improving  Engagement in Group:  Engaged  Additional Comments:  Pt attended the group and participated fully. Rates her energy at a 3/10. States she is here due to fear and anxiety. Committed to helping herself.  Paulino Rily

## 2021-10-29 NOTE — Group Note (Signed)
LCSW Group Therapy Note  10/29/2021    10:00-11:00am   Type of Therapy and Topic:  Group Therapy: Early Messages Received About Anger  Participation Level:  Active   Description of Group:   In this group, patients shared and discussed the early messages received in their lives about anger through parental or other adult modeling, teaching, repression, punishment, violence, and more.  Participants identified how those childhood lessons influence even now how they usually or often react when angered.  The group discussed that anger is a secondary emotion and what may be the underlying emotional themes that come out through anger outbursts or that are ignored through anger suppression.    Therapeutic Goals: Patients will identify one or more childhood message about anger that they received and how it was taught to them. Patients will discuss how these childhood experiences have influenced and continue to influence their own expression or repression of anger even today. Patients will explore possible primary emotions that tend to fuel their secondary emotion of anger. Patients will learn that anger itself is normal and cannot be eliminated, and that healthier coping skills can assist with resolving conflict rather than worsening situations.  Summary of Patient Progress:  The patient shared that their childhood lessons about anger were not to show emotion.  As a result, they have trouble showing emotions.  The patient participated fully and demonstrated insight.  Therapeutic Modalities:   Cognitive Behavioral Therapy Motivation Interviewing  [place], [place] 10/29/2021 11:27 AM

## 2021-10-30 MED ORDER — GABAPENTIN 100 MG PO CAPS
200.0000 mg | ORAL_CAPSULE | Freq: Three times a day (TID) | ORAL | Status: DC
  Administered 2021-10-30 – 2021-11-02 (×9): 200 mg via ORAL
  Filled 2021-10-30 (×12): qty 2

## 2021-10-30 NOTE — Progress Notes (Addendum)
   10/30/21 1500  Psych Admission Type (Psych Patients Only)  Admission Status Voluntary  Psychosocial Assessment  Patient Complaints Anxiety  Eye Contact Fair  Facial Expression Worried  Affect Appropriate to circumstance  Speech Logical/coherent  Interaction Assertive  Motor Activity Slow  Appearance/Hygiene Unremarkable  Behavior Characteristics Cooperative;Appropriate to situation  Mood Anxious  Thought Process  Coherency Concrete thinking  Content Ambivalence  Delusions None reported or observed  Perception WDL  Hallucination None reported or observed  Judgment Poor  Confusion None  Danger to Self  Current suicidal ideation? Passive  Self-Injurious Behavior Some self-injurious ideation observed or expressed.  No lethal plan expressed   Agreement Not to Harm Self Yes  Description of Agreement verbally agrees to approach staff before harming self at [place] to [place]  Danger to Others None reported or observed   D. Pt presents with an anxious affect/ mood- friendly during interactions- observed on the unit interacting well with peers and staff. Per pt's self inventory, pt rated her depression, hopelessness and anxiety a 4/4/7, respectively. Pt reported that her goal was "to work on becoming more coherent , become less depressed and work on discharge plan."  Pt currently denies SI/HI and AVH A. Labs and vitals monitored. Pt given and educated on medications. Pt supported emotionally and encouraged to express concerns and ask questions.   R. Pt remains safe with 15 minute checks. Will continue POC.

## 2021-10-30 NOTE — Progress Notes (Addendum)
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  Chart Review of Past 24 hrs: The patient's chart was reviewed and nursing notes were reviewed. The patient's case was discussed in multidisciplinary team meeting.  Per MAR: - Patient is compliant with scheduled meds. - PRNs: Tylenol x1 Per RN notes, no documented behavioral issues and is attending group. Patient sleep hours not reported  Patient had the following psychiatric recommendations yesterday:  - Continue home Cymbalta 90 mg daily -Continue gabapentin 100 mg 3 times daily for anxiety and alcohol cravings - Continue trazodone 50 mg nightly and repeat x1 as needed -Continue Ativan taper with 1 mg 4 times daily, followed by 1 mg 3 times daily, followed by 1 mg twice daily, followed by 1 mg daily (ends 12/12) - Continue CIWA monitoring with protocol for Ativan 1 mg every 6 hours for CIWA greater than 10 - Continue daily multivitamin and thiamine 100 mg   On Today's Assessment (10/30/2021): Case was discussed in the multidisciplinary team. MAR was reviewed and patient was compliant with medications. Patient seen, assessed, and discussed with attending Dr. Nelda Marseille.  Patient reports she is doing "better".  Patient attributes her improvement in mood due to feeling less anxious and more coherent in thought.  Patient still endorses excessive anxiety and concern with regards to her discharge planning reporting "I do not know how to get out of the situation I am in".  Patient expresses concern about financial stressors as well as employment stressors.  Patient denies suicidal/homicidal thoughts today.  Patient reports she is eating and sleeping appropriately.  Patient  denying any GI/GU complaints at this time.  Discussed plan to increase gabapentin 100 3 times daily to 200 3 times daily.  Reiterated the importance of patient to continue taking gabapentin as scheduled as opposed to as needed as she has reported she has been doing as an outpatient.  Discussed importance of being more than proactive in her own care and communicating more openly with her outpatient providers if she feels she is starting to decompensate. Encourage patient to consider having either Seroquel or Abilify as an adjunct to her antidepressant of Cymbalta.  Patient verbalized agreement to consider these options and had no other concerns at this time.  Principal Problem: MDD (major depressive disorder), recurrent severe, without psychosis ([place]) Diagnosis: Principal Problem:   MDD (major depressive disorder), recurrent severe, without psychosis ([place]) Active Problems:   Alcohol abuse   Suicidal ideation   Generalized anxiety disorder  Total Time spent with patient: I personally spent 35 minutes on the unit in direct patient care. The direct patient care time included face-to-face time with the patient, reviewing the patient's chart, communicating with other professionals, and coordinating care. Greater than 50% of this time was spent in counseling or coordinating care with the patient regarding goals of hospitalization, psycho-education, and discharge planning needs.   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   Procedure: TOTAL KNEE ARTHROPLASTY;  Surgeon: Paralee Cancel, MD;  Location: [facility];  Service: Orthopedics;  Laterality: Right;  70 mins   Family History:   Family History  Problem Relation Age of Onset   Anxiety disorder Sister    Depression Brother    Schizophrenia Brother    Suicidality Brother    Anxiety disorder Brother    Alcohol abuse Sister    Alcohol abuse Brother    Alcohol abuse Brother    Family Psychiatric  History: See above Social History:  Social History   Substance and Sexual Activity  Alcohol Use Yes   Comment: binge drinking- was in recovery for past 4 years     Social History   Substance and Sexual Activity  Drug Use No    Social History   Socioeconomic History   Marital status: Divorced    Spouse name: Not on file   Number of children: Not on file   Years of education: Not on file   Highest education level: Not on file  Occupational History   Not on file  Tobacco Use   Smoking status: Never   Smokeless tobacco: Never  Vaping Use   Vaping Use: Never used  Substance and Sexual Activity   Alcohol use: Yes    Comment: binge drinking- was in recovery for past 4 years   Drug use: No   Sexual activity: Not Currently  Other Topics Concern   Not on file  Social History Narrative   Not on file   Social Determinants of Health   Financial Resource Strain: Not on file  Food Insecurity: Not on file  Transportation Needs: Not on file  Physical Activity: Not on file  Stress: Not on file  Social Connections: Not on file   Sleep: Good  Appetite:  Good  Current Medications: Current Facility-Administered Medications  Medication Dose Route Frequency Provider Last Rate Last Admin   acetaminophen (TYLENOL) tablet 650 mg  650 mg Oral Q6H PRN Ethelene Hal, NP   650 mg at 10/29/21 1307   alum & mag hydroxide-simeth (MAALOX/MYLANTA) 200-200-20 MG/5ML suspension 30 mL  30 mL Oral Q4H PRN Ethelene Hal, NP       DULoxetine (CYMBALTA) DR capsule 90 mg  90 mg Oral Daily Murray Durrell E, MD   90 mg at 10/29/21 1245   gabapentin (NEURONTIN) capsule 100 mg  100 mg Oral TID Ethelene Hal,  NP   100 mg at 10/29/21 1728   hydrOXYzine (ATARAX) tablet 25 mg  25 mg Oral TID PRN Ethelene Hal, NP       loratadine (CLARITIN) tablet 10 mg  10 mg Oral Daily Ethelene Hal, NP   10 mg at 10/29/21 8099   LORazepam (ATIVAN) tablet 1 mg  1 mg Oral BID Viann Fish E, MD   1 mg at 10/29/21 1728   Followed by   Derrill Memo ON 10/31/2021] LORazepam (ATIVAN) tablet 1 mg  1 mg Oral Daily Zulema Pulaski E, MD       magnesium hydroxide (MILK OF MAGNESIA) suspension 30 mL  30 mL Oral Daily PRN Ethelene Hal, NP       multivitamin with minerals tablet 1 tablet  1 tablet Oral Daily Ethelene Hal, NP   1 tablet at 10/29/21 0828   ondansetron (ZOFRAN-ODT) disintegrating tablet 4 mg  4 mg Oral Q6H PRN Harlow Asa, MD       thiamine tablet  100 mg  100 mg Oral Daily Ethelene Hal, NP   100 mg at 10/29/21 5284   traZODone (DESYREL) tablet 50 mg  50 mg Oral QHS,MR X 1 Ethelene Hal, NP   50 mg at 10/29/21 2116    Lab Results:  No results found for this or any previous visit (from the past 36 hour(s)).   Blood Alcohol level:  Lab Results  Component Value Date   ETH 167 (H) 10/26/2021   ETH <10 13/24/4010    Metabolic Disorder Labs: Lab Results  Component Value Date   HGBA1C 5.3 10/27/2021   MPG 105.41 10/27/2021   MPG 102.54 01/19/2021   No results found for: PROLACTIN Lab Results  Component Value Date   CHOL 296 (H) 10/27/2021   TRIG 152 (H) 10/27/2021   HDL 91 10/27/2021   CHOLHDL 3.3 10/27/2021   VLDL 30 10/27/2021   LDLCALC 175 (H) 10/27/2021   LDLCALC 185 (H) 09/27/2021    Physical Findings: AIMS: Facial and Oral Movements Muscles of Facial Expression: None, normal Lips and Perioral Area: None, normal Jaw: None, normal Tongue: None, normal,Extremity Movements Upper (arms, wrists, hands, fingers): None, normal Lower (legs, knees, ankles, toes): None, normal, Trunk Movements Neck, shoulders, hips: None, normal, Overall  Severity Severity of abnormal movements (highest score from questions above): None, normal Incapacitation due to abnormal movements: None, normal Patient's awareness of abnormal movements (rate only patient's report): No Awareness, Dental Status Current problems with teeth and/or dentures?: No Does patient usually wear dentures?: No  CIWA:  CIWA-Ar Total: 1     Musculoskeletal: Strength & Muscle Tone: within normal limits Gait & Station: Less unsteady, less tremulous Patient leans: N/A  Psychiatric Specialty Exam:  Presentation  General Appearance: Casually dressed, improved hygiene  Eye Contact:Fair  Speech:Clear and Coherent; Normal Rate  Speech Volume:Normal  Handedness:Right   Mood and Affect  Mood:Dysphoric; Anxious (Overwhelmed)  Affect: anxious but overall brighter appearing  Thought Process  Thought Processes:Coherent; Goal Directed  Descriptions of Associations:Intact  Orientation:Full (Time, Place and Person)  Thought Content: denies SI/HI/AVH today. Denies paranoia, first rank symptoms, ideas of reference, thought insertion/extraction  History of Schizophrenia/Schizoaffective disorder:No  Duration of Psychotic Symptoms:NA Hallucinations: Denies  Ideas of Reference:None  Suicidal Thoughts:denies current SI and can contract for safety on the unit  Homicidal Thoughts: Denies   Sensorium  Memory:Immediate Good; Recent Fair  Judgment:Fair  Insight:Fair   Executive Functions  Concentration:Fair  Attention Span:Fair  [place]  Language:Good   Psychomotor Activity  Psychomotor Activity: Less tremulous, no restlessness   Assets  Assets:Desire for Improvement; Housing; Catering manager; Resilience; Social Support; Vocational/Educational   Sleep  Time unrecorded   Physical Exam Vitals and nursing note reviewed.  Constitutional:      Appearance: Normal appearance.     Comments: No longer  diaphoretic, remains tremulous but less so  HENT:     Head: Normocephalic and atraumatic.  Pulmonary:     Effort: Pulmonary effort is normal.  Neurological:     General: No focal deficit present.     Mental Status: She is alert.   Review of Systems  HENT:  Negative for congestion.   Respiratory:  Negative for shortness of breath.   Cardiovascular:  Negative for chest pain.  Gastrointestinal:  Negative for abdominal pain, constipation, diarrhea, heartburn, nausea and vomiting.  Genitourinary: Negative.   Musculoskeletal: Negative.   Neurological:  Positive for tremors. Negative for dizziness, tingling, weakness and headaches.  Blood pressure 94/71,  pulse 86, temperature (!) 96.9 F (36.1 C), resp. rate 16, height 5\' 4"  (1.626 m), weight 62.1 kg, SpO2 100 %. Body mass index is 23.52 kg/m.   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 -INCREASE gabapentin 100 mg to 200 mg 3 times daily for cravings and anxiety -Continue Ativan taper with 1 mg 4 times daily, followed by 1 mg 3 times daily, followed by 1 mg twice daily, followed by 1 mg daily (ends12/12) - Continue CIWA monitoring with protocol for Ativan 1 mg every 6 hours for CIWA greater than 10 (recent scores 1, 0, 4) - Continue daily multivitamin and thiamine 100 mg -- encouraged her to consider residential rehab or  SAIOP after discharge   Medical Management Covid negative CMP: Unremarkable CBC: unremarkable EtOH: 167 UDS: Negative TSH: 2.322 A1C: 5.3% Lipids: Cholesterol 296, Tg 152, LDL 175   #Seasonal allergies - Continue home Claritin 10 mg daily  #Dyslipidemia Cholesterol 296, LDL 175 - Advised to follow up with PCP for recommendations   #Abnormal UA (moderate blood, 5 ketones, >50 RBC, rare bacteria) 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              -- Estimated LOS: 5-7 days             -- Discharge Concerns: Need to establish a safety plan; Medication compliance and effectiveness             -- Discharge Goals: Return home with outpatient referrals for mental health follow-up including medication management/psychotherapy/sober living options/inpatient substance use rehabilitation.  SW to provide resources.  France Ravens, MD 10/30/2021, 7:21 AM

## 2021-10-30 NOTE — Group Note (Signed)
Date:  10/30/2021 Time:  10:41 AM  Group Topic/Focus:  Goals Group:   The focus of this group is to help patients establish daily goals to achieve during treatment and discuss how the patient can incorporate goal setting into their daily lives to aide in recovery.    Participation Level:  Active  Participation Quality:  Appropriate  Affect:  Appropriate  Cognitive:  Appropriate  Insight: Appropriate  Engagement in Group:  Engaged  Modes of Intervention:  Education  Additional Comments:  Pt wants to be more coherent and less depressed, also work on being discharged.  Garvin Fila 10/30/2021, 10:41 AM

## 2021-10-30 NOTE — Group Note (Unsigned)
Date:  10/30/2021 Time:  2:25 PM  Group Topic/Focus:  Building Self Esteem:   The Focus of this group is helping patients become aware of the effects of self-esteem on their lives, the things they and others do that enhance or undermine their self-esteem, seeing the relationship between their level of self-esteem and the choices they make and learning ways to enhance self-esteem.     Participation Level:  {BHH PARTICIPATION DIYME:15830}  Participation Quality:  {BHH PARTICIPATION QUALITY:22265}  Affect:  {BHH AFFECT:22266}  Cognitive:  {BHH COGNITIVE:22267}  Insight: {BHH Insight2:20797}  Engagement in Group:  {BHH ENGAGEMENT IN NMMHW:80881}  Modes of Intervention:  {BHH MODES OF INTERVENTION:22269}  Additional Comments:  ***  Jerrye Beavers 10/30/2021, 2:25 PM

## 2021-10-30 NOTE — Group Note (Signed)
[facility] LCSW Group Therapy Note  Date/Time:  10/30/2021 10:00-11:00AM  Type of Therapy and Topic:  Group Therapy:  Healthy and Unhealthy Supports plus being "My Own Hero"  Participation Level:  Active   Description of Group: Patients in this group were invited to identify the differences between healthy and unhealthy supports and then to identify those people in their lives who fall into one category or the other, as well as why.  They were then introduced to the idea of adding more healthy supports and decreasing the unhealthy ones.  Patients discussed what additional healthy supports could be helpful in their recovery and wellness after discharge in order to maintain stability.   An emphasis was placed on using counselor, doctor, therapy groups, 12-step groups, and problem-specific support groups to expand supports.  The song "My Own Hero" was played to encourage full participation in their own recovery journey instead of expecting others to do all the work for them.  The song "I Know Where I've Been" was played as further encouragement that they are further in their journey than they were previously.  Therapeutic Goals:   1)  discuss importance of adding supports to stay well once out of the hospital  2)  compare healthy versus unhealthy supports and identify some examples of each  3)  generate ideas and descriptions of healthy supports that can be added  4)  offer mutual support about how to address unhealthy supports  5)  encourage active participation in and adherence to discharge plan    Summary of Patient Progress:  The patient stated that current healthy supports in their life are her faith and her children, while current unhealthy supports include alcohol. The patient expressed a willingness to add appropriate support(s) to help in their recovery journey.   Therapeutic Modalities:   Motivational Interviewing Brief Solution-Focused Therapy  Ricke Kimoto Paige, [place] 10/30/2021  1:05 PM

## 2021-10-30 NOTE — Progress Notes (Signed)
Patient has been up and active on the unit, attended group this evening and has voiced no complaints. Patient currently denies having pain, -si/hi/a/v hall. Support and encouragement offered, safety maintained on unit, will continue to monitor.   10/29/21 2130  Psych Admission Type (Psych Patients Only)  Admission Status Voluntary  Psychosocial Assessment  Patient Complaints Depression  Eye Contact Fair  Facial Expression Sullen;Worried  Affect Depressed;Sad;Sullen  Speech Logical/coherent  Interaction Assertive  Motor Activity Slow  Appearance/Hygiene Unremarkable  Behavior Characteristics Appropriate to situation;Cooperative  Mood Depressed  Thought Pension scheme manager thinking  Content Ambivalence  Delusions None reported or observed  Perception WDL  Hallucination None reported or observed  Judgment Poor  Confusion None  Danger to Self  Current suicidal ideation? Passive  Self-Injurious Behavior Some self-injurious ideation observed or expressed.  No lethal plan expressed   Agreement Not to Harm Self Yes  Description of Agreement verbally agrees to approach staff before harming self at [place] to Others  Danger to Others None reported or observed    10/29/21 2130  Psych Admission Type (Psych Patients Only)  Admission Status Voluntary  Psychosocial Assessment  Patient Complaints Depression  Eye Contact Fair  Facial Expression Sullen;Worried  Affect Depressed;Sad;Sullen  Speech Logical/coherent  Interaction Assertive  Motor Activity Slow  Appearance/Hygiene Unremarkable  Behavior Characteristics Appropriate to situation;Cooperative  Mood Depressed  Thought Pension scheme manager thinking  Content Ambivalence  Delusions None reported or observed  Perception WDL  Hallucination None reported or observed  Judgment Poor  Confusion None  Danger to Self  Current suicidal ideation? Passive  Self-Injurious Behavior Some self-injurious ideation observed  or expressed.  No lethal plan expressed   Agreement Not to Harm Self Yes  Description of Agreement verbally agrees to approach staff before harming self at [place] to Others  Danger to Others None reported or observed

## 2021-10-31 ENCOUNTER — Telehealth (HOSPITAL_COMMUNITY): Payer: Self-pay | Admitting: Psychiatry

## 2021-10-31 MED ORDER — WHITE PETROLATUM EX OINT
TOPICAL_OINTMENT | CUTANEOUS | Status: AC
  Filled 2021-10-31: qty 5

## 2021-10-31 NOTE — Progress Notes (Signed)
Progress note  Pt found in bed; compliant with medication administration. Pt denies any physical complaints or pain. Pt denies all detox symptoms. Pt's CIWA <4 for 24 hrs. Order discontinued per parameters. Pt is pleasant and has been seen interacting with peers, playing games, and walking in the hallway. Pt denies si/hi/ah/vh and verbally agrees to approach staff if these become apparent or before harming themselves/others while at [place].  A: Pt provided support and encouragement. Pt given medication per protocol and standing orders. Q46m safety checks implemented and continued.  R: Pt safe on the unit. Will continue to monitor.

## 2021-10-31 NOTE — Plan of Care (Signed)
  Problem: Coping: Goal: Ability to verbalize frustrations and anger appropriately will improve Outcome: Progressing Goal: Ability to demonstrate self-control will improve Outcome: Progressing

## 2021-10-31 NOTE — Group Note (Signed)
[facility] LCSW Group Therapy Note   Group Date: 10/31/2021 Start Time: 1300 End Time: 1400   Type of Therapy:  Group Therapy: Cognitive Distortions [facility] LCSW Group Therapy Participation Level:  Active  Modes of Intervention: Clarification, Confrontation, Discussion, Education, Exploration, Limit-setting, Orientation, Problem-solving, Rapport Building, Art therapist, Socialization and Support  Summary of Progress/Problems: The topic for group today was emotional regulation. This group focused on both positive and negative emotion identification and allowed group members to process ways to identify feelings, regulate negative emotions, and find healthy ways to manage internal/external emotions. Group members were asked to reflect on a time when their reaction to an emotion led to a negative outcome and explored how alternative responses using emotion regulation would have benefited them. Group members were also asked to discuss a time when emotion regulation was utilized when a negative emotion was experienced. Pt shared her name during and that her current emotions are fear and anxiety.   Debra Rhodes, LCSWA

## 2021-10-31 NOTE — Progress Notes (Signed)
  Pt presents with high anxiety, depression and sadness.  Pt reports this is her 3rd admission this year.  Pt reports a sense of embarrassment when encouraged to attend the [place] meeting stating, "they are going to know who I am and tell others, guess who I saw."  Pt expresses the desire to quit drinking for good to be better for herself and her family.  Pt was provided emotional support.  Advised pt of medications to be administered.  Pt is safe on the unit with Q 15 minute safety checks.  Will continue to monitor.    10/31/21 2000  Psych Admission Type (Psych Patients Only)  Admission Status Voluntary  Psychosocial Assessment  Patient Complaints Anxiety;Depression;Sadness  Eye Contact Fair  Facial Expression Animated;Sad  Affect Appropriate to circumstance  Speech Logical/coherent  Interaction Assertive  Motor Activity Slow  Appearance/Hygiene Unremarkable  Behavior Characteristics Cooperative  Mood Pleasant;Depressed;Anxious  Thought Process  Coherency Concrete thinking  Content WDL  Delusions None reported or observed  Perception WDL  Hallucination None reported or observed  Judgment WDL  Confusion None  Danger to Self  Current suicidal ideation? Denies  Self-Injurious Behavior No self-injurious ideation or behavior indicators observed or expressed   Agreement Not to Harm Self Yes  Description of Agreement verbally agrees to approach staff before harming self at [place] to Others  Danger to Others None reported or observed

## 2021-10-31 NOTE — Plan of Care (Signed)
  Problem: Education: Goal: Mental status will improve Outcome: Progressing Goal: Verbalization of understanding the information provided will improve Outcome: Progressing   Problem: Education: Goal: Emotional status will improve Outcome: Not Progressing

## 2021-10-31 NOTE — BHH Counselor (Signed)
CSW met with patient and discussed discharge resources.  Patient agreed she wanted a referral to [place] program.  Patient demonstrated understanding around expectations of CDIOP program.  CSW also provided information about Alcohol and Drug services as a back up if CDIOP program does not work out.  Referral was made to [place] and information for Alcohol and Drug Services added to AVS.     Nahia Nissan, LCSW, [facility]

## 2021-10-31 NOTE — Progress Notes (Addendum)
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  Chart Review of Past 24 hrs: The patient's chart was reviewed and nursing notes were reviewed. The patient's case was discussed in multidisciplinary team meeting.  Per MAR: - Patient is compliant with scheduled meds. - PRNs: None Per RN notes, no documented behavioral issues and is attending group. Patient sleep hours not reported  Patient had the following psychiatric recommendations yesterday:  - Continue home Cymbalta 90 mg daily - Increase to gabapentin 200 mg 3 times daily for anxiety and alcohol cravings - Continue trazodone 50 mg nightly and repeat x1 as needed -Continue Ativan taper with 1 mg 4 times daily, followed by 1 mg 3 times daily, followed by 1 mg twice daily, followed by 1 mg daily (ends 12/12) - Continue CIWA monitoring with protocol for Ativan 1 mg every 6 hours for CIWA greater than 10 - Continue daily multivitamin and thiamine 100 mg   On Today's Assessment (10/31/2021): Case was discussed in the multidisciplinary team. MAR was reviewed and patient was compliant with medications. Patient seen, assessed, and discussed with attending Dr. Nelda Marseille.  Patient reports she is continuing to improve in her mood.  Patient feels a greater sense of control and security knowing that she will be able to stay with her boyfriend or her daughter upon discharge.  Patient continues to express concern about financial stressors as well as employment stressors, but reports getting help with FMLA from social work and her daughter. Patient denies suicidal/homicidal thoughts today.  Patient reports she is eating and sleeping appropriately.  Patient denying any GI/GU  complaints at this time, as well as withdrawal symptoms we again. We again discussed adjunctive therapy to her antidepressant of Cymbalta, but patient declined, as she has been improving.  Patient had no other concerns at this time.  Principal Problem: MDD (major depressive disorder), recurrent severe, without psychosis ([place]) Diagnosis: Principal Problem:   MDD (major depressive disorder), recurrent severe, without psychosis ([place]) Active Problems:   Alcohol abuse   Suicidal ideation   Generalized anxiety disorder  Total Time spent with patient: I personally spent 35 minutes on the unit in direct patient care. The direct patient care time included face-to-face time with the patient, reviewing the patient's chart, communicating with other professionals, and coordinating care. Greater than 50% of this time was spent in counseling or coordinating care with the patient regarding goals of hospitalization, psycho-education, and discharge planning needs.   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   Procedure: TOTAL KNEE ARTHROPLASTY;  Surgeon: Paralee Cancel, MD;  Location: [facility];  Service: Orthopedics;  Laterality: Right;  70 mins   Family History:  Family History  Problem Relation Age of Onset   Anxiety disorder Sister    Depression Brother    Schizophrenia Brother    Suicidality Brother    Anxiety disorder Brother  Alcohol abuse Sister    Alcohol abuse Brother    Alcohol abuse Brother    Family Psychiatric  History: See above Social History:  Social History   Substance and Sexual Activity  Alcohol Use Yes   Comment: binge drinking- was in recovery for past  4 years     Social History   Substance and Sexual Activity  Drug Use No    Social History   Socioeconomic History   Marital status: Divorced    Spouse name: Not on file   Number of children: Not on file   Years of education: Not on file   Highest education level: Not on file  Occupational History   Not on file  Tobacco Use   Smoking status: Never   Smokeless tobacco: Never  Vaping Use   Vaping Use: Never used  Substance and Sexual Activity   Alcohol use: Yes    Comment: binge drinking- was in recovery for past 4 years   Drug use: No   Sexual activity: Not Currently  Other Topics Concern   Not on file  Social History Narrative   Not on file   Social Determinants of Health   Financial Resource Strain: Not on file  Food Insecurity: Not on file  Transportation Needs: Not on file  Physical Activity: Not on file  Stress: Not on file  Social Connections: Not on file   Sleep: Good  Appetite:  Good  Current Medications: Current Facility-Administered Medications  Medication Dose Route Frequency Provider Last Rate Last Admin   acetaminophen (TYLENOL) tablet 650 mg  650 mg Oral Q6H PRN Ethelene Hal, NP   650 mg at 10/29/21 1307   alum & mag hydroxide-simeth (MAALOX/MYLANTA) 200-200-20 MG/5ML suspension 30 mL  30 mL Oral Q4H PRN Ethelene Hal, NP       DULoxetine (CYMBALTA) DR capsule 90 mg  90 mg Oral Daily Nelda Marseille, Breta Demedeiros E, MD   90 mg at 10/31/21 0746   gabapentin (NEURONTIN) capsule 200 mg  200 mg Oral TID France Ravens, MD   200 mg at 10/31/21 1149   hydrOXYzine (ATARAX) tablet 25 mg  25 mg Oral TID PRN Ethelene Hal, NP       loratadine (CLARITIN) tablet 10 mg  10 mg Oral Daily Ethelene Hal, NP   10 mg at 10/31/21 0746   magnesium hydroxide (MILK OF MAGNESIA) suspension 30 mL  30 mL Oral Daily PRN Ethelene Hal, NP       multivitamin with minerals tablet 1 tablet  1 tablet Oral Daily Ethelene Hal, NP   1 tablet at 10/31/21  0746   thiamine tablet 100 mg  100 mg Oral Daily Ethelene Hal, NP   100 mg at 10/31/21 0747   traZODone (DESYREL) tablet 50 mg  50 mg Oral QHS,MR X 1 Ethelene Hal, NP   50 mg at 10/30/21 2143    Lab Results:  No results found for this or any previous visit (from the past 24 hour(s)).   Blood Alcohol level:  Lab Results  Component Value Date   ETH 167 (H) 10/26/2021   ETH <10 76/72/0947    Metabolic Disorder Labs: Lab Results  Component Value Date   HGBA1C 5.3 10/27/2021   MPG 105.41 10/27/2021   MPG 102.54 01/19/2021   No results found for: PROLACTIN Lab Results  Component Value Date   CHOL 296 (H) 10/27/2021   TRIG 152 (H) 10/27/2021   HDL 91 10/27/2021  CHOLHDL 3.3 10/27/2021   VLDL 30 10/27/2021   LDLCALC 175 (H) 10/27/2021   LDLCALC 185 (H) 09/27/2021    Physical Findings: AIMS: Facial and Oral Movements Muscles of Facial Expression: None, normal Lips and Perioral Area: None, normal Jaw: None, normal Tongue: None, normal,Extremity Movements Upper (arms, wrists, hands, fingers): None, normal Lower (legs, knees, ankles, toes): None, normal, Trunk Movements Neck, shoulders, hips: None, normal, Overall Severity Severity of abnormal movements (highest score from questions above): None, normal Incapacitation due to abnormal movements: None, normal Patient's awareness of abnormal movements (rate only patient's report): No Awareness, Dental Status Current problems with teeth and/or dentures?: No Does patient usually wear dentures?: No  CIWA:  CIWA-Ar Total: 0  Musculoskeletal: Strength & Muscle Tone: within normal limits Gait & Station: steady, normal Patient leans: N/A  Psychiatric Specialty Exam:  Presentation  General Appearance: Casually dressed, good hygiene  Eye Contact:Fair  Speech:Clear and Coherent; Normal Rate  Speech Volume:Normal  Handedness:Right   Mood and Affect  Mood: Less depressed, much improved  Affect:  brighter appearing, full range  Thought Process  Thought Processes:Coherent; Goal Directed  Descriptions of Associations:Intact  Orientation:Full (Time, Place and Person)  Thought Content: denies SI/HI/AVH today. Denies paranoia, first rank symptoms, ideas of reference, thought insertion/extraction  History of Schizophrenia/Schizoaffective disorder:No  Duration of Psychotic Symptoms:NA Hallucinations: Denies  Ideas of Reference:None  Suicidal Thoughts:denies  Homicidal Thoughts: Denies   Sensorium  Memory:Immediate Good; Recent Fair  Judgment:Fair, improving  Insight:Fair, improving   Executive Functions  Concentration:Good  Attention Span:Good  [place] of Knowledge:Good  Language:Good   Psychomotor Activity  Psychomotor Activity: non-tremulous, no restlessness   Assets  Assets:Desire for Improvement; Housing; Catering manager; Resilience; Social Support; Vocational/Educational   Sleep  Time unrecorded   Physical Exam Vitals and nursing note reviewed.  Constitutional:      Appearance: Normal appearance.     Comments: No longer diaphoretic, no longer tremulous  HENT:     Head: Normocephalic and atraumatic.  Pulmonary:     Effort: Pulmonary effort is normal.  Neurological:     General: No focal deficit present.     Mental Status: She is alert.   Review of Systems  Respiratory:  Negative for shortness of breath.   Cardiovascular:  Negative for chest pain.  Gastrointestinal:  Negative for constipation, diarrhea, nausea and vomiting.  Neurological:  Negative for dizziness and headaches.  Blood pressure (!) 157/128, pulse (!) 102, temperature (!) 97.5 F (36.4 C), temperature source Oral, resp. rate 16, height 5\' 4"  (1.626 m), weight 62.1 kg, SpO2 100 %. Body mass index is 23.52 kg/m.   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  Now denying intrusive thoughts. - Continue home Cymbalta 90 mg daily - Continue gabapentin 200 mg 3 times daily for anxiety and alcohol cravings - Continue trazodone 50 mg nightly and repeat x1 as needed -- Declines Seroquel augmentation at this time  #Alcohol use disorder-severe/dependent Patient with resolved tremulousness, diaphoresis, chills, and nausea. -Continue gabapentin 200 mg 3 times daily for cravings and anxiety - Completed Ativan taper with 1 mg 4 times daily, followed by 1 mg 3 times daily, followed by 1 mg twice daily, followed by 1 mg daily (ends 12/12) - Continue CIWA monitoring with protocol for Ativan 1 mg every 6 hours for CIWA greater than 10 (recent scores 1,  0, 0) - Continue daily multivitamin and thiamine 100 mg -- Patient working with SW on [place] referral   Medical Management Covid negative CMP: Unremarkable CBC: unremarkable EtOH: 167 UDS: Negative TSH: 2.322 A1C: 5.3% Lipids: Cholesterol 296, Tg 152, LDL 175   #Seasonal allergies - Continue home Claritin 10 mg daily  #Dyslipidemia Cholesterol 296, LDL 175 - Advised to follow up with PCP for recommendations   #Abnormal UA (moderate blood, 5 ketones, >50 RBC, rare bacteria) 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              -- Estimated DoD: 12/13 or 12/14             -- Discharge Concerns: Need to establish a safety plan; Medication compliance and effectiveness             -- Discharge Goals: Return home with outpatient referrals for mental health follow-up including  medication management/psychotherapy/sober living options/inpatient substance use rehabilitation.  SW to provide resources.  Rosezetta Schlatter, MD PGY-1  10/31/2021, 4:15 PM

## 2021-10-31 NOTE — Progress Notes (Signed)
CSW spoke with patient at patient request.  Patient requesting that CSW assist with finding contact information for employer so that she can make some calls to get FMLA paperwork sent here for social work to fill it out.    CSW agreed that they can meet with patient after they get back from gym to find contact information from employer.    Debra Hinzman, LCSW, [place] Social Worker  [facility]

## 2021-10-31 NOTE — Progress Notes (Signed)
Patient did not attend the evening speaker AA meeting.  

## 2021-11-01 DIAGNOSIS — D485 Neoplasm of uncertain behavior of skin: Secondary | ICD-10-CM | POA: Insufficient documentation

## 2021-11-01 NOTE — Plan of Care (Signed)
°  Problem: Education: °Goal: Emotional status will improve °Outcome: Progressing °Goal: Mental status will improve °Outcome: Progressing °Goal: Verbalization of understanding the information provided will improve °Outcome: Progressing °  °

## 2021-11-01 NOTE — Group Note (Signed)
Recreation Therapy Group Note   Group Topic:Animal Assisted Therapy   Group Date: 11/01/2021 Start Time: 1430 End Time: 1500 Facilitators: Audyn Dimercurio, Bjorn Loser, LRT Location: [address] Dayroom   Animal-Assisted Activity (AAA) Program Checklist/Progress Notes Patient Eligibility Criteria Checklist & Daily Group note for Rec Tx Intervention   AAA/T Program Assumption of Risk Form signed by Patient/ or Parent Legal Guardian YES  Patient is free of allergies or severe asthma  YES  Patient reports no fear of animals YES  Patient reports no history of cruelty to animals YES  Patient understands their participation is voluntary YES  Patient washes hands before animal contact YES  Patient washes hands after animal contact YES   Group Description: Patients provided opportunity to interact with trained and credentialed [employer] Therapy dog and the community volunteer/dog handler. Patients practiced appropriate animal interaction and were educated on dog safety outside of the hospital in common community settings. Patients were allowed to use dog toys and other items to practice commands, engage the dog in play, and/or complete routine aspects of animal care. Patients participated with turn taking and structure in place as needed based on number of participants and quality of spontaneous participation delivered.  Goal Area(s) Addresses:  Patient will demonstrate appropriate social skills during group session.  Patient will identify reduction in stress level due to participation in animal assisted therapy session.    Education: Contractor, Pensions consultant, Communication & Social Skills    Affect/Mood: Congruent and Happy   Participation Level: Engaged   Participation Quality: Independent   Behavior: Calm, Cooperative, and Interactive    Speech/Thought Process: Directed, Focused, and Relevant   Insight: Moderate   Judgement: Moderate   Modes of Intervention:  Activity, Nurse, adult, and Socialization   Patient Response to Interventions:  Attentive and Interested    Education Outcome:  Acknowledges education   Clinical Observations/Individualized Feedback: Debra Rhodes attended session and interacted appropriately with therapy dog and peers. Patient asked questions about the therapy dog to community volunteer. Patient openly shared stories about their pets at home with group and expressed that they miss their dog, Debra 'Mimi', that is a rescued mixed breed.   Bjorn Loser Debra Rhodes, LRT, CTRS 11/01/2021 4:55 PM

## 2021-11-01 NOTE — Progress Notes (Addendum)
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  Chart Review of Past 24 hrs: The patient's chart was reviewed and nursing notes were reviewed. The patient's case was discussed in multidisciplinary team meeting.  Per MAR: - Patient is compliant with scheduled meds. - PRNs: None Per RN notes, no documented behavioral issues and is attending group. Patient sleep hours not reported  Patient had the following psychiatric recommendations yesterday:  - Continue home Cymbalta 90 mg daily - Increase to gabapentin 200 mg 3 times daily for anxiety and alcohol cravings - Continue trazodone 50 mg nightly and repeat x1 as needed - Completed Ativan taper with 1 mg 4 times daily, followed by 1 mg 3 times daily, followed by 1 mg twice daily, followed by 1 mg daily (ends 12/12) - Continue CIWA monitoring with protocol for Ativan 1 mg every 6 hours for CIWA greater than 10 - Continue daily multivitamin and thiamine 100 mg   On Today's Assessment (11/01/2021): Case was discussed in the multidisciplinary team. MAR was reviewed and patient was compliant with medications. Patient seen, assessed, and discussed with attending Dr. Berdine Addison.  Patient reports she is continuing to improve in her mood.  Patient feels a greater sense of security knowing that she will be able to stay with her boyfriend upon discharge, but also expresses fear of the unknown and returning to an uncontrolled environment.  Patient continues to express concern about financial stressors as well as employment stressors but is agreeable to take things "day by day." Patient denies suicidal/homicidal thoughts, paranoia and other first rank sx today.  Patient reports she is eating  and sleeping appropriately.  Patient denying any GI/GU complaints at this time, as well as withdrawal symptoms. Patient feels stable on current medication regimen. Patient had no other concerns at this time. Patient given assignment to write down 30 coping mechanisms today.  Principal Problem: MDD (major depressive disorder), recurrent severe, without psychosis ([place]) Diagnosis: Principal Problem:   MDD (major depressive disorder), recurrent severe, without psychosis ([place]) Active Problems:   Alcohol abuse   Suicidal ideation   Generalized anxiety disorder  Total Time spent with patient: 20 minutes  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   Procedure: TOTAL KNEE ARTHROPLASTY;  Surgeon: Paralee Cancel, MD;  Location: [facility];  Service: Orthopedics;  Laterality: Right;  70 mins   Family History:  Family History  Problem Relation Age of Onset   Anxiety disorder Sister    Depression Brother    Schizophrenia Brother    Suicidality Brother    Anxiety disorder Brother    Alcohol abuse Sister    Alcohol abuse Brother    Alcohol abuse Brother    Family Psychiatric  History: See above Social History:  Social History   Substance and Sexual Activity  Alcohol Use Yes   Comment: binge drinking- was in recovery for past 4  years     Social History   Substance and Sexual Activity  Drug Use No    Social History   Socioeconomic History   Marital status: Divorced    Spouse name: Not on file   Number of children: Not on file   Years of education: Not on file   Highest education level: Not on file  Occupational History   Not on file  Tobacco Use   Smoking  status: Never   Smokeless tobacco: Never  Vaping Use   Vaping Use: Never used  Substance and Sexual Activity   Alcohol use: Yes    Comment: binge drinking- was in recovery for past 4 years   Drug use: No   Sexual activity: Not Currently  Other Topics Concern   Not on file  Social History Narrative   Not on file   Social Determinants of Health   Financial Resource Strain: Not on file  Food Insecurity: Not on file  Transportation Needs: Not on file  Physical Activity: Not on file  Stress: Not on file  Social Connections: Not on file   Sleep: Good  Appetite:  Good  Current Medications: Current Facility-Administered Medications  Medication Dose Route Frequency Provider Last Rate Last Admin   acetaminophen (TYLENOL) tablet 650 mg  650 mg Oral Q6H PRN Ethelene Hal, NP   650 mg at 10/29/21 1307   alum & mag hydroxide-simeth (MAALOX/MYLANTA) 200-200-20 MG/5ML suspension 30 mL  30 mL Oral Q4H PRN Ethelene Hal, NP       DULoxetine (CYMBALTA) DR capsule 90 mg  90 mg Oral Daily Singleton, Amy E, MD   90 mg at 11/01/21 0825   gabapentin (NEURONTIN) capsule 200 mg  200 mg Oral TID France Ravens, MD   200 mg at 11/01/21 0825   hydrOXYzine (ATARAX) tablet 25 mg  25 mg Oral TID PRN Ethelene Hal, NP       loratadine (CLARITIN) tablet 10 mg  10 mg Oral Daily Ethelene Hal, NP   10 mg at 11/01/21 3149   magnesium hydroxide (MILK OF MAGNESIA) suspension 30 mL  30 mL Oral Daily PRN Ethelene Hal, NP       multivitamin with minerals tablet 1 tablet  1 tablet Oral Daily Ethelene Hal, NP   1 tablet at 11/01/21 0825   thiamine tablet 100 mg  100 mg Oral Daily Ethelene Hal, NP   100 mg at 11/01/21 0825   traZODone (DESYREL) tablet 50 mg  50 mg Oral QHS,MR X 1 Ethelene Hal, NP   50 mg at 10/31/21 2131    Lab Results:  No results found for this or any previous visit (from the past 43 hour(s)).   Blood Alcohol level:  Lab Results   Component Value Date   ETH 167 (H) 10/26/2021   ETH <10 70/26/3785    Metabolic Disorder Labs: Lab Results  Component Value Date   HGBA1C 5.3 10/27/2021   MPG 105.41 10/27/2021   MPG 102.54 01/19/2021   No results found for: PROLACTIN Lab Results  Component Value Date   CHOL 296 (H) 10/27/2021   TRIG 152 (H) 10/27/2021   HDL 91 10/27/2021   CHOLHDL 3.3 10/27/2021   VLDL 30 10/27/2021   LDLCALC 175 (H) 10/27/2021   LDLCALC 185 (H) 09/27/2021    Physical Findings: AIMS: Facial and Oral Movements Muscles of Facial Expression: None, normal Lips and Perioral Area: None, normal Jaw: None, normal Tongue: None, normal,Extremity Movements  Upper (arms, wrists, hands, fingers): None, normal Lower (legs, knees, ankles, toes): None, normal, Trunk Movements Neck, shoulders, hips: None, normal, Overall Severity Severity of abnormal movements (highest score from questions above): None, normal Incapacitation due to abnormal movements: None, normal Patient's awareness of abnormal movements (rate only patient's report): No Awareness, Dental Status Current problems with teeth and/or dentures?: No Does patient usually wear dentures?: No  CIWA:  CIWA-Ar Total: 0  Musculoskeletal: Strength & Muscle Tone: within normal limits Gait & Station: steady, normal Patient leans: N/A  Psychiatric Specialty Exam:  Presentation  General Appearance: Casually dressed, good hygiene  Eye Contact:Good  Speech:Clear and Coherent; Normal Rate  Speech Volume:Normal  Handedness:Right   Mood and Affect  Mood: Less depressed, much improved  Affect: brighter appearing, full range  Thought Process  Thought Processes:Coherent; Goal Directed  Descriptions of Associations:Intact  Orientation:Full (Time, Place and Person)  Thought Content: denies SI/HI/AVH today. Denies paranoia, first rank symptoms, ideas of reference, thought insertion/extraction  History of Schizophrenia/Schizoaffective  disorder:No  Duration of Psychotic Symptoms:NA Hallucinations: Denies  Ideas of Reference:None  Suicidal Thoughts:denies  Homicidal Thoughts: Denies   Sensorium  Memory:Immediate Good; Recent Fair  Judgment:Fair, improving  Insight:Fair, improving   Executive Functions  Concentration:Good  Attention Span:Good  [place] of Knowledge:Good  Language:Good   Psychomotor Activity  Psychomotor Activity: non-tremulous, no restlessness   Assets  Assets:Desire for Improvement; Housing; Catering manager; Resilience; Social Support; Vocational/Educational   Sleep  Time unrecorded   Physical Exam Vitals and nursing note reviewed.  Constitutional:      Appearance: Normal appearance.     Comments: No longer diaphoretic, no longer tremulous  HENT:     Head: Normocephalic and atraumatic.  Pulmonary:     Effort: Pulmonary effort is normal.  Neurological:     General: No focal deficit present.     Mental Status: She is alert.   Review of Systems  Respiratory:  Negative for shortness of breath.   Cardiovascular:  Negative for chest pain.  Gastrointestinal:  Negative for constipation, diarrhea, nausea and vomiting.  Neurological:  Negative for dizziness and headaches.  Blood pressure 99/74, pulse 79, temperature 97.6 F (36.4 C), temperature source Oral, resp. rate 16, height 5\' 4"  (1.626 m), weight 62.1 kg, SpO2 99 %. Body mass index is 23.52 kg/m.   Treatment Plan Summary: Eliani has greatly improved in physical withdrawal symptoms and mood over the course of admission. She feels largely stable but still has great fear about facing the outside world. She does not feel ready for discharge, as she and her boyfriend would like to advocate for PHP vs. CDIOP. SW in process of assisting. 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  Now denying intrusive thoughts. - Continue home Cymbalta 90 mg daily - Continue gabapentin 200 mg 3 times daily for anxiety and alcohol cravings - Continue trazodone 50 mg nightly and repeat x1 as needed -- Declines Seroquel augmentation at this time  #Alcohol use disorder-severe/dependent Patient with resolved tremulousness, diaphoresis, chills, and nausea. -Continue gabapentin 200 mg 3 times daily for cravings and anxiety - Completed Ativan taper with 1 mg 4 times daily, followed by 1 mg 3 times daily, followed by 1 mg twice daily, followed by 1 mg daily (ends 12/12) - Discontinue CIWA monitoring with protocol for Ativan 1 mg every 6 hours for CIWA greater than 10 (recent  scores 1, 0, 0; scores 0-1 x 48 hours) - Continue daily multivitamin and thiamine 100 mg -- Patient working with SW on [place] referral   Medical Management Covid negative CMP: Unremarkable CBC: unremarkable EtOH: 167 UDS: Negative TSH: 2.322 A1C: 5.3% Lipids: Cholesterol 296, Tg 152, LDL 175   #Seasonal allergies - Continue home Claritin 10 mg daily  #Dyslipidemia Cholesterol 296, LDL 175 - Advised to follow up with PCP for recommendations   #Abnormal UA (moderate blood, 5 ketones, >50 RBC, rare bacteria) 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              -- Estimated DoD: 12/14             -- Discharge Concerns: Need to establish a safety plan; Medication compliance and effectiveness             -- Discharge Goals: Return home with outpatient referrals for mental health follow-up including medication management/psychotherapy/sober living  options/inpatient substance use rehabilitation.  SW to provide resources.  Rosezetta Schlatter, MD PGY-1  11/01/2021, 9:40 AM

## 2021-11-01 NOTE — Progress Notes (Signed)
Patient boyfriend called and left voicemail advocating for patient to be connected to [facility] program.  Boyfriend reports that patient has been to several substance use programs and at that point he doesn't know how affective it will be for long term treatment for patient.  Boyfriend reports that usually patient will get anxious and depressed and then will start drinking in a response to that.  He believes that it may be helpful to treat the mental health that supersedes the substance use as a preventative measure.  CSW agreed to reach out to [facility] program to see if it is a possibility.    Vista Sawatzky, LCSW, [place] Social Worker  [facility]

## 2021-11-01 NOTE — Progress Notes (Signed)
CSW spoke with patient and presented patient with alternative PHP program options, including with [facility] and [facility].  Information put in AVS and patient has a scheduled assessment tomorrow at [place], LCSW, [place] Worker  [facility]

## 2021-11-01 NOTE — Progress Notes (Addendum)
Pt attended orientation /goals group. Presents with bright affect, logical speech, congruent affect and forwards in communication.  Goal: 30 healthy ways to alleviate anxiety. "I'm currently in recovery, plan to attend PHP as soon as I'm discharge to avoid downtown or possible relapse".

## 2021-11-01 NOTE — Progress Notes (Addendum)
°  Pt presents with mild anxiety.  Pt reports, "discharging tomorrow and a little anxious about that."  Pt states, "my sponsor is picking me up and I will have a follow up appointment." Pt observed in day room interacting with peers and on the phone.  Pt denies SI/HI and verbally contracts for safety.  Pt denies AVH.  Medication administered.  Pt is safe on unit with Q 15 minute safety checks.   11/01/21 2119  Psych Admission Type (Psych Patients Only)  Admission Status Voluntary  Psychosocial Assessment  Patient Complaints Anxiety  Eye Contact Fair  Facial Expression Animated  Affect Appropriate to circumstance  Speech Logical/coherent  Interaction Assertive  Motor Activity Slow  Appearance/Hygiene Unremarkable  Behavior Characteristics Anxious  Mood Anxious  Thought Process  Coherency Concrete thinking  Content WDL  Delusions None reported or observed  Perception WDL  Hallucination None reported or observed  Judgment WDL  Confusion None  Danger to Self  Current suicidal ideation? Denies  Self-Injurious Behavior No self-injurious ideation or behavior indicators observed or expressed   Agreement Not to Harm Self Yes  Description of Agreement verbally agrees to approach staff before harming self at [place] to Others  Danger to Others None reported or observed

## 2021-11-01 NOTE — Plan of Care (Signed)
Nurse discussed coping skills with patient.  

## 2021-11-01 NOTE — Progress Notes (Signed)
The patient's positive event for the day is that she enjoyed the groups and activities. Patient states that she has a good discharge plan in place. Her goal for tomorrow is to get discharged.

## 2021-11-01 NOTE — Progress Notes (Signed)
D:  Patient's self inventory sheet, patient has fair sleep, sleep medication helpful.  Good appetite, low energy level, fair concentration.  Rated hopeless and depression 4, anxiety 7.  Denied withdrawals.  Denied SI.  Denied physical problems.  Denied physical pain.  Goal is discharge and work on anxiety.  Plans to talk to MD/SW.  Does have discharge plans. A:  Medications administered per MD orders.  Emotional support and encouragement given patient. R:  Denied SI and HI, contracts for safety.  Denied A/V hallucinations.  Safety maintained with 15 minute checks.

## 2021-11-02 ENCOUNTER — Encounter (HOSPITAL_COMMUNITY): Payer: Self-pay

## 2021-11-02 MED ORDER — THIAMINE HCL 100 MG PO TABS
100.0000 mg | ORAL_TABLET | Freq: Every day | ORAL | 0 refills | Status: DC
Start: 2021-11-03 — End: 2022-02-22

## 2021-11-02 MED ORDER — GABAPENTIN 100 MG PO CAPS: ORAL_CAPSULE | ORAL | 2 refills | Status: DC

## 2021-11-02 NOTE — BHH Suicide Risk Assessment (Signed)
[facility] Discharge Suicide Risk Assessment   Principal Problem: MDD (major depressive disorder), recurrent severe, without psychosis ([place]) Discharge Diagnoses: Principal Problem:   MDD (major depressive disorder), recurrent severe, without psychosis ([place]) Active Problems:   Alcohol abuse   Suicidal ideation   Generalized anxiety disorder   Total Time spent with patient: 20 minutes  Musculoskeletal: Strength & Muscle Tone: within normal limits Gait & Station: normal Patient leans: N/A  Psychiatric Specialty Exam  Presentation  General Appearance: Casual; Appropriate for Environment  Eye Contact:Fair  Speech:Normal Rate  Speech Volume:Normal  Handedness:Right   Mood and Affect  Mood:Euthymic  Duration of Depression Symptoms: Greater than two weeks  Affect:Appropriate   Thought Process  Thought Processes:Linear  Descriptions of Associations:Intact  Orientation:Full (Time, Place and Person)  Thought Content:Logical  History of Schizophrenia/Schizoaffective disorder:No  Duration of Psychotic Symptoms:No data recorded Hallucinations:Hallucinations: None Ideas of Reference:None  Suicidal Thoughts:Suicidal Thoughts: No Homicidal Thoughts:Homicidal Thoughts: No  Sensorium  Memory:Immediate Good  Judgment:Fair  Insight:Fair   Executive Functions  Concentration:Fair  Attention Span:Good  [place] of Knowledge:Good  Language:Good   Psychomotor Activity  Psychomotor Activity:Psychomotor Activity: Normal  Assets  Assets:Communication Skills; Financial Resources/Insurance; Social Support; Transportation   Sleep  Sleep:Sleep: Fair  Physical Exam: Physical Exam ROS Blood pressure 109/66, pulse 75, temperature 98.4 F (36.9 C), temperature source Oral, resp. rate 16, height 5\' 4"  (1.626 m), weight 62.1 kg, SpO2 98 %. Body mass index is 23.52 kg/m.  Mental Status Per Nursing Assessment::   On Admission:  Self-harm  thoughts  Demographic Factors:  Caucasian and Living alone  Loss Factors: Decline in physical health and Financial problems/change in socioeconomic status  Historical Factors: Impulsivity  Risk Reduction Factors:   Employed and Positive social support  Continued Clinical Symptoms:  Dysthymia  Cognitive Features That Contribute To Risk:  None    Suicide Risk:  Minimal: No identifiable suicidal ideation.  Patients presenting with no risk factors but with morbid ruminations; may be classified as minimal risk based on the severity of the depressive symptoms   Follow-up Information     Group, [facility] up on 11/04/2021.   Specialty: Behavioral Health Why: You have appointments for therapy services on 11/04/21 and 11/10/21 at 8:00 am. Contact information: [address] [place] [address] [phone]         BEHAVIORAL HEALTH INTENSIVE CHEMICAL DEPENDENCY. Go on 11/08/2021.   Specialty: Behavioral Health Why: Referral made for the chemical dependency intensive outpatient program on 11/08/21 at 2:30 pm.  Please bring your insurance card and wear a mask to the appointment. Contact information: [place] [id number] mc [facility]. Go to.   Why: You have an appointment at 3pm in person with Amber on 11/02/2021.  Please go to this provider to participate in assessment to start PHP program. The office building is right across from [facility]. Contact information: [address] [place], [place] [address] [phone]        FMLA paperwork. Call.   Why: Please contact and send FMLA paperwork.  We can provide FMLA for your inpatient stay and a week after your hospital stay.  For further FMLA and disability please follow up with outpatient provider. Contact information: Riki Altes, LCSW- social worker (ph) [phone] email: [email or web address]  Plan Of Care/Follow-up recommendations:  Activity:  ad lib Diet:  cardiac diet.  Other:  follow up with IOP today Prescriptions for new medications provided for the patient to bridge to follow up appointment. The patient was informed that refills for these prescriptions are generally not provided, and patient is encouraged to attend all follow up appointments to address medication refills and adjustments.   Today's discharge was reviewed with treatment team, and the team is in agreement that the patient is ready for discharge. The patient is was of the discharge plan for today and has been given opportunity to ask questions. At time of discharge, the patient does not vocalize any acute harm to self or others, is goal directed, able to advocate for self and organizational baseline.   At discharge, the patient is instructed to:  Take all medications as prescribed. Report any adverse effects and or reactions from the medicines to her outpatient provider promptly.  Do not engage in alcohol and/or illegal drug use while on prescription medicines.  In the event of worsening symptoms, patient is instructed to call the crisis hotline, 911 and or go to the nearest ED for appropriate evaluation and treatment of symptoms.  Follow-up with primary care provider for further care of medical issues, concerns and or health care needs. * Substance abuse follow up: it is recommended that you follow up with community support treatment, like AA/NA. It is also recommended that the patient attend 90 meetings in 90 days, otherwise known as "90 in [address]   Maida Sale, MD 11/02/2021, 10:37 AM

## 2021-11-02 NOTE — Discharge Instructions (Addendum)
Dear Debra Rhodes, I am so glad you are feeling better and can be discharged today (11/02/2021)! You were admitted for management of major depressive disorder and detox.   Please see the following instructions: Please attend the scheduled appointment or make an appointment to follow-up with your psychiatrist, therapist, and the other specialists seen below. Please see your primary care / family doctor for your other medical issues, concerns and or health care needs: inquire about urology referral for chronic blood in your urine and about elevated cholesterol. NEW meds:  Thiamine (Vitamin B1) supplementation to prevent Wernicke's encephalopathy CONTINUE home meds (with changes): Cymbalta 90 mg by mouth daily for depression, Gabapentin 200 mg by mouth three times daily for anxiety/cravings, and  Dayvigo 5 mg and Jornay PM 20 mg by mouth at bedtime for sleep.  Report any adverse effects and or reactions from the medicines to your outpatient provider promptly. Do not engage in alcohol and or illegal drug use while on prescription medicines. In the event of worsening symptoms, text or call the [place] at 45, call 911, visit the [facility]gent [place] located at [address], and/or go to the nearest ED for appropriate evaluation and treatment of symptoms.  It was a pleasure meeting you, Ms. Mees.  I wish you and your family the best, and hope you stay happy and healthy!  Rosezetta Schlatter, MD 11/02/2021

## 2021-11-02 NOTE — Discharge Summary (Signed)
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  On chart review, patient had a recent admission to [facility] in August 2022 for SI after relapsing on alcohol and receives outpatient follow-up for therapy and medication managed with [facility]. She was also admitted to [facility] from 3/1 to 01/24/2021 for a suicide attempt by cutting her wrist in the context of alcohol relapse. Patient stated she was sober for 5 months after completing a rehab stay at [facility] after she was discharged from [facility] in March. Most recently she states she was sober for 4 months after discharge from [facility] in August.   On assessment today, patient reports feeling overwhelmed with work. This is what led her to break her sobriety. Once she started drinking wine last week, she "could not stop."  She endorses drinking 2 bottles of wine per day for the 3 days prior to admission.  She reports that in the context of her relapse she has planned to harm herself or even kill herself by cutting her wrists.  She reports that her last suicide attempt was in August in which she had taken pills. She came to the hospital after being spotted buying alcohol by someone in her AA group who reached out to offer assistance.  She endorses SI, but is able to contract for her safety on the unit.  She denies HI/AVH/paranoia and other first rank symptoms.  She does not voice delusions.  She endorses  depressed mood with decreased energy, anhedonia, insomnia with increased desire to sleep (waking up with racing thoughts), poor concentration, and poor appetite worsening over the last several months.  She denies manic symptoms or any history of manic symptoms including elevated mood, impulsivity, hypersexuality, and decreased need for sleep.  She reports no trauma nor legal history.  She also reports no firearms in the home.  She reports that her daughters have been her support system, but believes that they are "done" with her.  She reports that her last drink was this morning.  She reports diaphoresis and chills and appears significantly tremulous on assessment. She denies other illicit drug use. She states she has most recently been managed on a combination of Cymbalta 90mg  daily for depression, Neurontin 100mg  qam and 200mg  qhs PRN anxiety, and Dayvigo for sleep. Her outpatient provider has also tried her on Jornay or a Ritalin based stimulant recently for her ADD."  Principal Problem: MDD (major depressive disorder), recurrent severe, without psychosis ([place]) Discharge Diagnoses: Principal Problem:   MDD (major depressive disorder), recurrent severe, without psychosis ([place]) Active Problems:   Alcohol abuse   Suicidal ideation   Generalized anxiety disorder   Past Psychiatric History: See H&P  Past Medical History:  Past Medical History:  Diagnosis Date   Anxiety    Depression    PONV (postoperative nausea and vomiting)     Past Surgical History:  Procedure Laterality Date   AUGMENTATION MAMMAPLASTY Bilateral 1996   BACK SURGERY     CESAREAN SECTION  FRACTURE SURGERY     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   Procedure: TOTAL KNEE ARTHROPLASTY;  Surgeon: Paralee Cancel, MD;  Location: [facility] ORS;  Service: Orthopedics;  Laterality: Right;  70 mins   Family History:  Family History  Problem Relation Age of Onset   Anxiety disorder Sister    Depression Brother    Schizophrenia  Brother    Suicidality Brother    Anxiety disorder Brother    Alcohol abuse Sister    Alcohol abuse Brother    Alcohol abuse Brother    Family Psychiatric  History: See above Social History:  Social History   Substance and Sexual Activity  Alcohol Use Yes   Comment: binge drinking- was in recovery for past 4 years     Social History   Substance and Sexual Activity  Drug Use No    Social History   Socioeconomic History   Marital status: Divorced    Spouse name: Not on file   Number of children: Not on file   Years of education: Not on file   Highest education level: Not on file  Occupational History   Not on file  Tobacco Use   Smoking status: Never   Smokeless tobacco: Never  Vaping Use   Vaping Use: Never used  Substance and Sexual Activity   Alcohol use: Yes    Comment: binge drinking- was in recovery for past 4 years   Drug use: No   Sexual activity: Not Currently  Other Topics Concern   Not on file  Social History Narrative   Not on file   Social Determinants of Health   Financial Resource Strain: Not on file  Food Insecurity: Not on file  Transportation Needs: Not on file  Physical Activity: Not on file  Stress: Not on file  Social Connections: Not on file    Hospital Course:  After the above admission evaluation, Kathe's presenting symptoms were noted. She was recommended for mood stabilization treatments. The medication regimen targeting those presenting symptoms were discussed with her & initiated with her consent: Continued home Cymbalta 90 mg daily for depressive sx. Her UDS on arrival to the ED was neg, BAL 167, and she did  develop alcohol withdrawal symptoms (tremors, nausea, and GI discomfort) so received alcohol detoxification treatments:  - Initiate gabapentin 100 mg 3 times daily for cravings and anxiety (up-titrated to 100 mg BID and 200 mg at bedtime) - Initiate Ativan taper with 1 mg 4 times daily, followed by 1 mg 3 times daily, followed by  1 mg twice daily, followed by 1 mg daily. - Continue CIWA monitoring with protocol for Ativan 1 mg every 6 hours for CIWA greater than 10 - Continue daily multivitamin and thiamine 100 mg. She was however medicated, stabilized & discharged on the medications as listed on her discharge medication lists below. Besides the mood stabilization treatments   Milica was also enrolled & participated in the group counseling sessions being offered & held on this unit. She learned coping skills. She presented no other significant pre-existing medical issues that required treatment. She tolerated her treatment regimen without any adverse effects or reactions reported.   During the course of her hospitalization, the 15-minute checks were adequate to ensure patient's safety.  Paije did not display any dangerous, violent or suicidal behavior on the unit.  She interacted with patients & staff appropriately, participated appropriately in the group sessions/therapies. Her medications were addressed & adjusted to meet her needs. She was recommended for  outpatient follow-up care & medication management upon discharge to assure continuity of care & mood stability.  At the time of discharge patient is not reporting any acute suicidal/homicidal ideations. She feels more confident about her self-care & in managing her mental health. She currently denies any new issues or concerns. Education and supportive counseling provided throughout her hospital stay & upon discharge.   Today upon her discharge evaluation with the attending psychiatrist   Shakendra shares she is doing well. She denies any other specific concerns. She is sleeping well. Her appetite is good. She denies other physical complaints. She denies AH/VH, delusional thoughts or paranoia. She does not appear to be responding to any internal stimuli. She feels that her medications have been helpful & is in agreement to continue her current treatment regimen as recommended. She was  able to engage in safety planning including plan to return to [facility] or contact emergency services if she feels unable to maintain her own safety or the safety of others. Patient had no further questions, comments, or concerns. She left [facility] with all personal belongings in no apparent distress. Transportation per AA sponsor/friend.    Physical Findings: AIMS: Facial and Oral Movements Muscles of Facial Expression: None, normal Lips and Perioral Area: None, normal Jaw: None, normal Tongue: None, normal,Extremity Movements Upper (arms, wrists, hands, fingers): None, normal Lower (legs, knees, ankles, toes): None, normal, Trunk Movements Neck, shoulders, hips: None, normal, Overall Severity Severity of abnormal movements (highest score from questions above): None, normal Incapacitation due to abnormal movements: None, normal Patient's awareness of abnormal movements (rate only patient's report): No Awareness, Dental Status Current problems with teeth and/or dentures?: No Does patient usually wear dentures?: No  CIWA:  CIWA-Ar Total: 0   Musculoskeletal: Strength & Muscle Tone: within normal limits Gait & Station: normal, steady Patient leans: N/A   Psychiatric Specialty Exam:  Presentation  General Appearance: Casual; Appropriate for Environment  Eye Contact:Fair  Speech:Normal Rate  Speech Volume:Normal  Handedness:Right   Mood and Affect  Mood:Euthymic ; less anxious Affect:Appropriate Congruent; pleasant, bright  Thought Process  Thought Processes:Linear  Descriptions of Associations:Intact  Orientation:Full (Time, Place and Person)  Thought Content:Logical  History of Schizophrenia/Schizoaffective disorder:No  Duration of Psychotic Symptoms:N/A Hallucinations:Denies Ideas of Reference:None  Suicidal Thoughts:Denies Homicidal Thoughts:Denies  Sensorium  Memory:Immediate Good  Judgment:Good  Insight:Good   Executive Functions   Concentration:Fair  Attention Span:Good  [place] of Knowledge:Good  Language:Good   Psychomotor Activity  Psychomotor Activity:Normal  Assets  Assets:Communication Skills; Financial Resources/Insurance; Social Support; Transportation   Sleep  Sleep:Reports good quality; hours not documented   Physical Exam: Vitals and nursing note reviewed.  Constitutional:      Appearance: Normal appearance.     Comments: No longer diaphoretic, no longer tremulous  HENT:     Head: Normocephalic and atraumatic.  Pulmonary:     Effort: Pulmonary effort is normal.  Neurological:     General: No focal deficit present.     Mental Status: She is alert.    Review of Systems  Respiratory:  Negative for shortness of breath.   Cardiovascular:  Negative for chest pain.  Gastrointestinal:  Negative for constipation, diarrhea, nausea and vomiting.  Neurological:  Negative for dizziness and headaches.  Blood pressure 109/66, pulse 75, temperature 98.4 F (36.9 C), temperature source Oral, resp. rate 16, height 5\' 4"  (1.626 m), weight 62.1 kg, SpO2 98 %. Body mass index is 23.52 kg/m.   Social History   Tobacco Use  Smoking Status Never  Smokeless Tobacco Never   Tobacco Cessation:  N/A, patient does not currently use tobacco products   Blood Alcohol level:  Lab Results  Component Value Date   ETH 167 (H) 10/26/2021   ETH <10 70/35/0093    Metabolic Disorder Labs:  Lab Results  Component Value Date   HGBA1C 5.3 10/27/2021   MPG 105.41 10/27/2021   MPG 102.54 01/19/2021   No results found for: PROLACTIN Lab Results  Component Value Date   CHOL 296 (H) 10/27/2021   TRIG 152 (H) 10/27/2021   HDL 91 10/27/2021   CHOLHDL 3.3 10/27/2021   VLDL 30 10/27/2021   LDLCALC 175 (H) 10/27/2021   LDLCALC 185 (H) 09/27/2021    See Psychiatric Specialty Exam and Suicide Risk Assessment completed by Attending Physician prior to discharge.  Discharge destination:   Home  Is patient on multiple antipsychotic therapies at discharge:  No   Has Patient had three or more failed trials of antipsychotic monotherapy by history:  No  Recommended Plan for Multiple Antipsychotic Therapies: NA   Allergies as of 11/02/2021       Reactions   Latex Shortness Of Breath, Swelling, Rash        Medication List     STOP taking these medications    VITAMIN B 12 PO       TAKE these medications      Indication  cetirizine 10 MG tablet Commonly known as: ZYRTEC Take 10 mg by mouth daily.  Indication: Hayfever   DayVigo 5 MG Tabs Generic drug: Lemborexant Take 5 mg by mouth at bedtime.  Indication: Trouble Sleeping   DULoxetine 30 MG capsule Commonly known as: CYMBALTA Take 1 capsule (30 mg total) by mouth daily. Take with a 60 mg capsule to equal total daily dose of 90 mg  Indication: Major Depressive Disorder   DULoxetine 60 MG capsule Commonly known as: Cymbalta Take 1 capsule (60 mg total) by mouth daily. Take with a 30 mg capsule to equal total daily dose of 90 mg.  Indication: Generalized Anxiety Disorder, Major Depressive Disorder   estradiol-norethindrone 0.05-0.14 MG/DAY Commonly known as: [place] 1 patch onto the skin once a week.  Indication: Deficiency of the Hormone Estrogen   gabapentin 100 MG capsule Commonly known as: NEURONTIN Take 100 mg in the morning and 100 mg at noon, then take (2) capsules 200 mg in the evening.  Indication: Abuse or Misuse of Alcohol   Jornay PM 20 MG Cp24 Generic drug: Methylphenidate HCl ER (PM) Take 20 mg by mouth every evening.  Indication: Attention Deficit Hyperactivity Disorder   multivitamin tablet Take 1 tablet by mouth daily.  Indication: Nutritional Support   thiamine 100 MG tablet Take 1 tablet (100 mg total) by mouth daily.  Indication: Deficiency of Vitamin B1        Follow-up Information     Group, [facility] up on 11/04/2021.   Specialty:  Behavioral Health Why: You have appointments for therapy services on 11/04/21 and 11/10/21 at 8:00 am. Contact information: [address] [place] [address] [phone]         BEHAVIORAL HEALTH INTENSIVE CHEMICAL DEPENDENCY. Go on 11/08/2021.   Specialty: Behavioral Health Why: Referral made for the chemical dependency intensive outpatient program on 11/08/21 at 2:30 pm.  Please bring your insurance card and wear a mask to the appointment. Contact information: [place] [phone]  [facility] Outpatient PHP. Go to.   Why: You have an appointment at 3pm in person with Amber on 11/02/2021.  Please go to this provider to participate in assessment to start PHP program. The office building is right across from [facility]. Contact information: [address] [place], [place] [address] [phone]        FMLA paperwork. Call.   Why: Please contact and send FMLA paperwork.  We can provide FMLA for your inpatient stay and a week after your hospital stay.  For further FMLA and disability please follow up with outpatient provider. Contact information: Riki Altes, LCSW- social worker (ph) [phone] email: [email or web address]                Follow-up recommendations:  Activity:  Normal, as tolerated Diet:  Per PCP recommendation  Patient is instructed prior to discharge to: Take all medications as prescribed by her mental healthcare provider. Report any adverse effects and/or reactions from the medicines to her outpatient provider promptly. Patient has been instructed & cautioned: To not engage in alcohol and or illegal drug use while on prescription medicines.  In the event of worsening symptoms, patient is instructed to call the crisis hotline at 988, 911 and or go to the nearest ED for appropriate evaluation and treatment of  symptoms. To follow-up with her primary care provider for your other medical issues, concerns and or health care needs.   Signed: Rosezetta Schlatter, MD 11/06/2021, 8:50 PM

## 2021-11-02 NOTE — BH IP Treatment Plan (Signed)
Interdisciplinary Treatment and Diagnostic Plan Update  11/02/2021  Debra Rhodes MRN: [id number]  Principal Diagnosis: MDD (major depressive disorder), recurrent severe, without psychosis ([place])  Secondary Diagnoses: Principal Problem:   MDD (major depressive disorder), recurrent severe, without psychosis ([place]) Active Problems:   Alcohol abuse   Suicidal ideation   Generalized anxiety disorder   Current Medications:  Current Facility-Administered Medications  Medication Dose Route Frequency Provider Last Rate Last Admin   acetaminophen (TYLENOL) tablet 650 mg  650 mg Oral Q6H PRN Ethelene Hal, NP   650 mg at 10/29/21 1307   alum & mag hydroxide-simeth (MAALOX/MYLANTA) 200-200-20 MG/5ML suspension 30 mL  30 mL Oral Q4H PRN Ethelene Hal, NP       DULoxetine (CYMBALTA) DR capsule 90 mg  90 mg Oral Daily Nelda Marseille, Amy E, MD   90 mg at 11/02/21 9242   gabapentin (NEURONTIN) capsule 200 mg  200 mg Oral TID France Ravens, MD   200 mg at 11/02/21 0804   hydrOXYzine (ATARAX) tablet 25 mg  25 mg Oral TID PRN Ethelene Hal, NP       loratadine (CLARITIN) tablet 10 mg  10 mg Oral Daily Ethelene Hal, NP   10 mg at 11/02/21 0804   magnesium hydroxide (MILK OF MAGNESIA) suspension 30 mL  30 mL Oral Daily PRN Ethelene Hal, NP       multivitamin with minerals tablet 1 tablet  1 tablet Oral Daily Ethelene Hal, NP   1 tablet at 11/02/21 6834   thiamine tablet 100 mg  100 mg Oral Daily Ethelene Hal, NP   100 mg at 11/02/21 0804   traZODone (DESYREL) tablet 50 mg  50 mg Oral QHS,MR X 1 Ethelene Hal, NP   50 mg at 11/01/21 2119   PTA Medications: Medications Prior to Admission  Medication Sig Dispense Refill Last Dose   cetirizine (ZYRTEC) 10 MG tablet Take 10 mg by mouth daily.      Cyanocobalamin (VITAMIN B 12 PO) Take 1 tablet by mouth daily.      DULoxetine (CYMBALTA) 30 MG capsule Take 1 capsule (30 mg total) by mouth daily. Take  with a 60 mg capsule to equal total daily dose of 90 mg 90 capsule 0    DULoxetine (CYMBALTA) 60 MG capsule Take 1 capsule (60 mg total) by mouth daily. Take with a 30 mg capsule to equal total daily dose of 90 mg. 90 capsule 0    estradiol-norethindrone (COMBIPATCH) 0.05-0.14 MG/DAY Place 1 patch onto the skin once a week.      Lemborexant (DAYVIGO) 5 MG TABS Take 5 mg by mouth at bedtime. 30 tablet 2    Methylphenidate HCl ER, PM, (JORNAY PM) 20 MG CP24 Take 20 mg by mouth every evening. 30 capsule 0    Multiple Vitamin (MULTIVITAMIN) tablet Take 1 tablet by mouth daily.      [DISCONTINUED] gabapentin (NEURONTIN) 100 MG capsule Take 100 mg in the morning and 100 mg at noon, then take (2) capsules 200 mg in the evening. 90 capsule 2     Patient Stressors: Health problems   Medication change or noncompliance   Substance abuse    Patient Strengths: Ability for insight  Capable of independent living  Motivation for treatment/growth   Treatment Modalities: Medication Management, Group therapy, Case management,  1 to 1 session with clinician, Psychoeducation, Recreational therapy.   Physician Treatment Plan for Primary Diagnosis: MDD (major depressive disorder), recurrent severe, without  psychosis ([place]) Long Term Goal(s): Improvement in symptoms so as ready for discharge   Short Term Goals: Ability to identify changes in lifestyle to reduce recurrence of condition will improve Ability to verbalize feelings will improve Ability to disclose and discuss suicidal ideas Ability to demonstrate self-control will improve Ability to identify and develop effective coping behaviors will improve Ability to maintain clinical measurements within normal limits will improve Compliance with prescribed medications will improve Ability to identify triggers associated with substance abuse/mental health issues will improve  Medication Management: Evaluate patient's response, side effects, and tolerance of  medication regimen.  Therapeutic Interventions: 1 to 1 sessions, Unit Group sessions and Medication administration.  Evaluation of Outcomes: Adequate for Discharge  Physician Treatment Plan for Secondary Diagnosis: Principal Problem:   MDD (major depressive disorder), recurrent severe, without psychosis ([place]) Active Problems:   Alcohol abuse   Suicidal ideation   Generalized anxiety disorder  Long Term Goal(s): Improvement in symptoms so as ready for discharge   Short Term Goals: Ability to identify changes in lifestyle to reduce recurrence of condition will improve Ability to verbalize feelings will improve Ability to disclose and discuss suicidal ideas Ability to demonstrate self-control will improve Ability to identify and develop effective coping behaviors will improve Ability to maintain clinical measurements within normal limits will improve Compliance with prescribed medications will improve Ability to identify triggers associated with substance abuse/mental health issues will improve     Medication Management: Evaluate patient's response, side effects, and tolerance of medication regimen.  Therapeutic Interventions: 1 to 1 sessions, Unit Group sessions and Medication administration.  Evaluation of Outcomes: Adequate for Discharge   RN Treatment Plan for Primary Diagnosis: MDD (major depressive disorder), recurrent severe, without psychosis ([place]) Long Term Goal(s): Knowledge of disease and therapeutic regimen to maintain health will improve  Short Term Goals: Ability to verbalize feelings will improve, Ability to identify and develop effective coping behaviors will improve, and Compliance with prescribed medications will improve  Medication Management: RN will administer medications as ordered by provider, will assess and evaluate patient's response and provide education to patient for prescribed medication. RN will report any adverse and/or side effects to prescribing  provider.  Therapeutic Interventions: 1 on 1 counseling sessions, Psychoeducation, Medication administration, Evaluate responses to treatment, Monitor vital signs and CBGs as ordered, Perform/monitor CIWA, COWS, AIMS and Fall Risk screenings as ordered, Perform wound care treatments as ordered.  Evaluation of Outcomes: Adequate for Discharge   LCSW Treatment Plan for Primary Diagnosis: MDD (major depressive disorder), recurrent severe, without psychosis ([place]) Long Term Goal(s): Safe transition to appropriate next level of care at discharge, Engage patient in therapeutic group addressing interpersonal concerns.  Short Term Goals: Engage patient in aftercare planning with referrals and resources, Increase emotional regulation, and Facilitate acceptance of mental health diagnosis and concerns  Therapeutic Interventions: Assess for all discharge needs, 1 to 1 time with Social worker, Explore available resources and support systems, Assess for adequacy in community support network, Educate family and significant other(s) on suicide prevention, Complete Psychosocial Assessment, Interpersonal group therapy.  Evaluation of Outcomes: Adequate for Discharge   Progress in Treatment: Attending groups: Yes. Participating in groups: Yes. Taking medication as prescribed: Yes. Toleration medication: Yes. Family/Significant other contact made: Yes, individual(s) contacted:  husband Patient understands diagnosis: Yes. Discussing patient identified problems/goals with staff: Yes. Medical problems stabilized or resolved: Yes. Denies suicidal/homicidal ideation: Yes. Issues/concerns per patient self-inventory: No. Other: None  New problem(s) identified: No, Describe:  None  New Short  Term/Long Term Goal(s):medication stabilization, elimination of SI thoughts, development of comprehensive mental wellness plan.   Patient Goals:   "Manage my anxiety and want to live."  Discharge Plan or Barriers: Pt will  follow up at [facility] program. Pt will continue therapy at [facility].   Reason for Continuation of Hospitalization: Medication stabilization  Estimated Length of Stay: 3-5 days   Scribe for Treatment Team: Eliott Nine 11/02/2021 9:55 AM

## 2021-11-02 NOTE — Progress Notes (Signed)
°  [facility] Management Discharge Plan :  Will you be returning to the same living situation after discharge:  Yes,  patient will be staying with boyfriend  At discharge, do you have transportation home?: Yes,  friend will be picking patient up Do you have the ability to pay for your medications: Yes,  insurance  Release of information consent forms completed and in the chart;  Patient's signature needed at discharge.  Patient to Follow up at:  Follow-up Information     [facility] on 11/04/2021.   Specialty: Behavioral Health Why: You have appointments for therapy services on 11/04/21 and 11/10/21 at 8:00 am. Contact information: [address] [place] [address] [phone]         BEHAVIORAL HEALTH INTENSIVE CHEMICAL DEPENDENCY. Go on 11/08/2021.   Specialty: Behavioral Health Why: Referral made for the chemical dependency intensive outpatient program on 11/08/21 at 2:30 pm.  Please bring your insurance card and wear a mask to the appointment. Contact information: [place] [phone] [facility] [place] [facility]. Go to.   Why: You have an appointment at 3pm in person on 11/02/2021.  Please go to this provider to participate in assessment to start PHP program. The office building is right across from [facility] Contact information: [address] [place], [place] [address] [phone]                Next level of care provider has access to [place] and Suicide Prevention discussed: Yes,  boyfriend, Zenia Resides     Has patient been referred to the Quitline?: N/A patient is not a smoker  Patient has been referred for addiction treatment: Yes  [place], LCSW 11/02/2021, 9:41 AM

## 2021-11-02 NOTE — Progress Notes (Signed)
Pt anxious about leaving and about another pt on the unit. Pt requested no PRNs. RN met with pt and reviewed pt's discharge instructions. Pt verbalized understanding of discharge instructions and pt did not have any questions. RN reviewed and provided pt with a copy of SRA, AVS and Transition Record. RN returned pt's belongings to pt. Pt denied SI/HI/AVH and voiced no concerns. Pt was appreciative of the care pt received at [facility]. Patient discharged to the lobby without incident.   11/02/21 0803  Psych Admission Type (Psych Patients Only)  Admission Status Voluntary  Psychosocial Assessment  Patient Complaints Anxiety  Eye Contact Fair  Facial Expression Animated  Affect Appropriate to circumstance  Speech Logical/coherent  Interaction Assertive  Motor Activity Other (Comment) (wdl)  Appearance/Hygiene Unremarkable  Behavior Characteristics Anxious;Cooperative  Mood Anxious;Pleasant  Thought Process  Coherency WDL  Content WDL  Delusions None reported or observed  Perception WDL  Hallucination None reported or observed  Judgment WDL  Confusion None  Danger to Self  Current suicidal ideation? Denies  Self-Injurious Behavior No self-injurious ideation or behavior indicators observed or expressed   Agreement Not to Harm Self Yes  Description of Agreement verbally agrees to approach staff before harming self at [place] to Others  Danger to Others None reported or observed

## 2021-11-04 ENCOUNTER — Ambulatory Visit (INDEPENDENT_AMBULATORY_CARE_PROVIDER_SITE_OTHER): Payer: BC Managed Care – PPO | Admitting: Psychiatry

## 2021-11-04 ENCOUNTER — Other Ambulatory Visit: Payer: Self-pay

## 2021-11-04 DIAGNOSIS — F411 Generalized anxiety disorder: Secondary | ICD-10-CM

## 2021-11-04 NOTE — Progress Notes (Signed)
[facility] Counselor/Therapist Progress Note  Patient ID: CLIO GERHART, MRN: [id number],    Date: 11/04/2021  Time Spent: 50 minutes  Treatment Type: Individual Therapy  Reported Symptoms: anxiety, "mid-depression", fears  Mental Status Exam:  Appearance:   Well Groomed     Behavior:  Appropriate, Sharing, and Motivated  Motor:  Normal  Speech/Language:   Clear and Coherent  Affect:  Depressed and anxious  Mood:  anxious and depressed  Thought process:  goal directed  Thought content:    Some obsessiveness and overthinking  Sensory/Perceptual disturbances:    WNL  Orientation:  oriented to person, place, time/date, situation, day of week, month of year, year, and stated date of Dec. 16, 2022  Attention:  Good  Concentration:  Fair  Memory:  [place] of knowledge:   Good  Insight:    Fair  Judgment:   Good and Fair  Impulse Control:  Good and Fair   Risk Assessment: Danger to Self:  No Self-injurious Behavior: No Danger to Others: No Duty to Warn:no Physical Aggression / Violence:No  Access to Firearms a concern: No  Gang Involvement:No   Subjective: Patient in today reporting anxiety, "mid-depression", and fears (possible job loss, not breaking this cycle involving anxiety/severe depression/ drinking).  Some intermittent tearfulness.  Denies any SI.  Was hospitalized last week due to SI and alcohol usage. Rates self as a " 8"   on a 1-10 scale for anxiety. Rates self as a "5" on depression scale of 1-10 . Feeling "way over my head in my job and is quite stressful." Is currently looking for a more suitable job. Going to AA almost daily and to get back into working out. Needs to better manage "my feelings of anxiety and depression which is something I've struggled with a long time." While in hospital she was told about program at [facility] offering outpatient program and partial hosp program.  After discussing this with patient, we were in contact with that  program and a staff member there shared with me 2 different programs that are available and agreed to be back in touch with patient later today to follow up with more information.  Currently patient is not drinking and we discussed her need to not purchase more alcohol at this point as she has none at home.  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  She is in daily touch with her sponsor who is also offered for her to stay with her for a few days after having gotten out of the hospital recently from relapsing.  Denies any current SI and states she has no alcohol at home, commits to not purchasing any nor accepting any from anybody else.  She is currently "off" from work and is not due to return till later next week.  Admits "I get too much in my head" and she ends up going into a very negative/self-defeating direction.  Encouraged more direct communication with those closest to her and who support her.  Still recognizes how she tends to worry over things that she cannot control, with "control" being a significant issue for patient, and how this worrying exacerbates her symptoms.  Specific focus on her anxiety reduction as that is what patient says is always the beginning point of her relapsing.  Interventions: Cognitive Behavioral Therapy, Solution-Oriented/Positive Psychology, and Ego-Supportive  Treatment Plan: Patient not signing treatment plan on computer  screen due to COVID. Treatment Goals: Treatment goals remain on treatment plan while patient works with strategies to achieve her goals.  Progress is assessed each session and noted in the "progress" section of treatment note. Long term goal: Develop healthy cognitive patterns and beliefs about self in the world that lead to alleviation and help prevent the relapse of depressive and anxiety symptoms. Short term goal: Verbally express understanding the relationship between  depressed mood and repression of feelings such as anger hurt and sadness. Strategies: Replace negative self-defeating self talk with verbalization of realistic and positive cognitive messages that do not feed anxiety nor depression.  Diagnosis:   ICD-10-CM   1. Generalized anxiety disorder  F41.1      Plan:  Patient today showing good motivation and participated actively in session focusing on "trying to get back on track after having been recently hospitalized due to relapse."  Interested in the [facility] program which offers partial hospitalization program and also outpatient substance abuse program and contact was made with them today and they are to follow-up with patient later today.  Processed some of patient's anxious thoughts which tends to lead to more depression and then to her relapsing.  Outlined some behavioral strategies for patient to try to use to refrain from relapsing as well as maintaining some of the helpful contact she has with sponsor, daily AA meetings, and supportive friends and family.  Encouraged patient in working to have more positive behaviors to support her remaining alcohol free including: Not purchasing any alcohol, not accepting alcohol from others, to remain in daily contact with her [place] sponsor, attend daily AA meetings, set limits with others as she needs to including significant other, positive self talk more consistently, practicing good self-care, continuing her exercise even when she cannot be at the gym using alternative exercises, remaining in the present focusing on what she can change or control versus cannot, saying no when she needs to say no, and feeling positive about the strength she shows working with goal-directed behaviors to move in a direction that supports her sobriety and overall improved emotional health.  Goal review and progress/challenges noted with patient.  Next appointment within 2 weeks.  This record has been created using [employer].  Chart creation errors have been sought, but may not always have been located and corrected.  Such creation errors do not reflect on the standard of medical care provided.   Shanon Ace, LCSW

## 2021-11-08 ENCOUNTER — Ambulatory Visit (HOSPITAL_COMMUNITY): Payer: BC Managed Care – PPO | Admitting: Licensed Clinical Social Worker

## 2021-11-10 ENCOUNTER — Other Ambulatory Visit: Payer: Self-pay

## 2021-11-10 ENCOUNTER — Ambulatory Visit (INDEPENDENT_AMBULATORY_CARE_PROVIDER_SITE_OTHER): Payer: BC Managed Care – PPO | Admitting: Psychiatry

## 2021-11-10 DIAGNOSIS — F411 Generalized anxiety disorder: Secondary | ICD-10-CM

## 2021-11-10 NOTE — Progress Notes (Signed)
[facility] Counselor/Therapist Progress Note  Patient ID: Debra Rhodes, MRN: [id number],    Date: 11/10/2021  Time Spent: 50 minutes   Treatment Type: Individual Therapy  Reported Symptoms: anxiety, depression  Mental Status Exam:  Appearance:   Well Groomed     Behavior:  Appropriate, Sharing, and Motivated  Motor:  Normal  Speech/Language:   Clear and Coherent  Affect:  Depressed and anxious  Mood:  anxious and depressed  Thought process:  goal directed  Thought content:    Obsessions and overthinking  Sensory/Perceptual disturbances:    WNL  Orientation:  oriented to person, place, time/date, situation, day of week, month of year, year, and stated date of Dec. 22, 2022  Attention:  Fair  Concentration:  Fair  Memory:  [place] of knowledge:   Good  Insight:    Good and Fair  Judgment:   Good and Fair  Impulse Control:  Good and Fair   Risk Assessment: Danger to Self:  No Self-injurious Behavior: No Danger to Others: No Duty to Warn:no Physical Aggression / Violence:No  Access to Firearms a concern: No  Gang Involvement:No   Subjective:  Patient in today reporting anxiety and depression, but feeling good that she has gotten accepted into local 4 week outpatient partial hospitalization program focusing more heavily on her depression and anxiety.  Is to begin that program tomorrow.  Did some further work today regarding the anxiety that tends to lead to her being more depressed and resulting in self-medicating with alcohol.  Continues to be in daily contact with her [place] sponsor.  Processed how she's been alcohol-free these past few days since relapsing and her plan and to work with outpatient [facility] program and her treatment goals to stabilize more to move forward in maintaining sobriety and  decrease her anxiety and depression. Has holiday plans to be with supportive adult daughter and others who will not be consuming alcohol around patient. Discussed upcoming work  stressors and plans for support through family and [place] sponsor. Rates self as "7" on 1-10 scale and "4-5" on depression 1-10 scale.  Encouraged patient as she prepares to enter the outpatient partial hospitalization program.  Denies any SI.  Seems more motivated today and there is no tearfulness.  Encouraged ongoing direct communication with those she is close to and that support her including her 3 adult daughters and her [place] sponsor, and boyfriend who remains sober at this point.  Also worked some with patient's worrying again today as this is a significant issue for her especially as she struggles with "things out of my control", and also reports it has affected her turning to alcohol "for relief".  Continued focus on anxiety reduction and better management as patient has reported previously the anxiety almost always precipitates her relapsing with alcohol.  Interventions: Solution-Oriented/Positive Psychology, Ego-Supportive, and Insight-Oriented  Treatment Plan: Patient not signing treatment plan on computer screen due to [place]. Treatment Goals: Treatment goals remain on treatment plan while patient works with strategies to achieve her goals.  Progress is assessed each session and noted in the "progress" section of treatment note. Long term goal: Develop healthy cognitive patterns and beliefs about self in the world that lead to alleviation and help prevent the relapse of depressive and anxiety symptoms. Short term goal: Verbally express understanding the relationship between depressed mood and repression of feelings such as anger hurt and sadness. Strategies: Replace negative self-defeating self talk with verbalization of realistic and positive cognitive  messages that do not feed anxiety nor depression.   Diagnosis:   ICD-10-CM   1. Generalized anxiety disorder  F41.1      Plan: Patient today showing motivation and actively participated in session focusing on "getting back on track after  recently being hospitalized due to a relapse".  Is to enter a partial hospitalization program starting tomorrow focusing on anxiety and depression, substance abuse.  Outlined behavioral strategies for patient to try to use to refrain from relapsing as well as maintaining some helpful contact she has with her sponsor, daily AA meetings, and supportive friends and family.  Encouraged her and working to have more positive behaviors to support her remaining alcohol free including: Not purchasing any alcohol, not accepting alcohol from others, to remain in daily contact with her [place] sponsor, attend daily AA meetings, set limits with others as she needs to including significant other, positive self talk more consistently, practicing good self-care, continuing her exercise even when she cannot be at the gym using alternative exercises, remaining in the present focusing on what she can control, saying no when she needs to say no, and recognize her positive strength when she works with goal-directed behaviors to move in a direction that supports her sobriety and overall improved emotional health.  Goal review and progress/challenges noted with patient.  Next appointment within 2 weeks.  This record has been created using [employer].  Chart creation errors have been sought, but may not always have been located and corrected.  Such creation errors do not reflect on the standard of medical care provided.    Shanon Ace, LCSW

## 2021-11-22 DIAGNOSIS — F411 Generalized anxiety disorder: Secondary | ICD-10-CM | POA: Diagnosis not present

## 2021-11-23 DIAGNOSIS — F411 Generalized anxiety disorder: Secondary | ICD-10-CM | POA: Diagnosis not present

## 2021-11-24 DIAGNOSIS — F411 Generalized anxiety disorder: Secondary | ICD-10-CM | POA: Diagnosis not present

## 2021-11-25 DIAGNOSIS — F411 Generalized anxiety disorder: Secondary | ICD-10-CM | POA: Diagnosis not present

## 2021-11-28 DIAGNOSIS — F411 Generalized anxiety disorder: Secondary | ICD-10-CM | POA: Diagnosis not present

## 2021-11-29 DIAGNOSIS — F411 Generalized anxiety disorder: Secondary | ICD-10-CM | POA: Diagnosis not present

## 2021-11-30 ENCOUNTER — Ambulatory Visit: Payer: BC Managed Care – PPO | Admitting: Psychiatry

## 2021-11-30 DIAGNOSIS — F411 Generalized anxiety disorder: Secondary | ICD-10-CM | POA: Diagnosis not present

## 2021-12-02 ENCOUNTER — Ambulatory Visit: Payer: BC Managed Care – PPO | Admitting: Psychiatry

## 2021-12-02 DIAGNOSIS — F411 Generalized anxiety disorder: Secondary | ICD-10-CM | POA: Diagnosis not present

## 2021-12-05 DIAGNOSIS — F411 Generalized anxiety disorder: Secondary | ICD-10-CM | POA: Diagnosis not present

## 2021-12-06 DIAGNOSIS — F411 Generalized anxiety disorder: Secondary | ICD-10-CM | POA: Diagnosis not present

## 2021-12-07 DIAGNOSIS — F411 Generalized anxiety disorder: Secondary | ICD-10-CM | POA: Diagnosis not present

## 2021-12-08 DIAGNOSIS — F411 Generalized anxiety disorder: Secondary | ICD-10-CM | POA: Diagnosis not present

## 2021-12-09 ENCOUNTER — Ambulatory Visit: Payer: BC Managed Care – PPO | Admitting: Psychiatry

## 2021-12-09 DIAGNOSIS — F411 Generalized anxiety disorder: Secondary | ICD-10-CM | POA: Diagnosis not present

## 2021-12-13 DIAGNOSIS — F411 Generalized anxiety disorder: Secondary | ICD-10-CM | POA: Diagnosis not present

## 2021-12-14 DIAGNOSIS — F411 Generalized anxiety disorder: Secondary | ICD-10-CM | POA: Diagnosis not present

## 2021-12-15 ENCOUNTER — Ambulatory Visit: Payer: BC Managed Care – PPO | Admitting: Psychiatry

## 2021-12-15 DIAGNOSIS — F411 Generalized anxiety disorder: Secondary | ICD-10-CM | POA: Diagnosis not present

## 2021-12-16 DIAGNOSIS — F411 Generalized anxiety disorder: Secondary | ICD-10-CM | POA: Diagnosis not present

## 2021-12-19 DIAGNOSIS — F411 Generalized anxiety disorder: Secondary | ICD-10-CM | POA: Diagnosis not present

## 2021-12-20 DIAGNOSIS — F411 Generalized anxiety disorder: Secondary | ICD-10-CM | POA: Diagnosis not present

## 2021-12-21 DIAGNOSIS — F411 Generalized anxiety disorder: Secondary | ICD-10-CM | POA: Diagnosis not present

## 2021-12-22 ENCOUNTER — Encounter: Payer: Self-pay | Admitting: Family

## 2021-12-22 ENCOUNTER — Other Ambulatory Visit: Payer: Self-pay

## 2021-12-22 ENCOUNTER — Ambulatory Visit (INDEPENDENT_AMBULATORY_CARE_PROVIDER_SITE_OTHER): Payer: BC Managed Care – PPO | Admitting: Family

## 2021-12-22 VITALS — BP 105/70 | HR 69 | Temp 97.8°F | Ht 64.0 in | Wt 133.4 lb

## 2021-12-22 DIAGNOSIS — J0110 Acute frontal sinusitis, unspecified: Secondary | ICD-10-CM

## 2021-12-22 DIAGNOSIS — F411 Generalized anxiety disorder: Secondary | ICD-10-CM | POA: Diagnosis not present

## 2021-12-22 MED ORDER — AMOXICILLIN-POT CLAVULANATE 875-125 MG PO TABS: 1.0000 | ORAL_TABLET | Freq: Two times a day (BID) | ORAL | 0 refills | Status: DC

## 2021-12-22 NOTE — Assessment & Plan Note (Signed)
pressure vs. pain, loss of taste/smell, advised increasing Flonase to bid and saline nasal flush to tid, sending Augmentin, advised on use & SE, wait day or 2 before starting as may not need.

## 2021-12-22 NOTE — Patient Instructions (Signed)
It was very nice to see you today!  Start the antibiotic, Augmentin, if symptoms are not better after another day or 2 of increasing your Flonase to twice a day and nasal saline flushes to 3 times per day. You can also take up to 600mg  Ibuprofen or 2 Aleve twice a day to decrease inflammation in your nasal passages.    PLEASE NOTE:  If you had any lab tests please let us know if you have not heard back within a few days. You may see your results on MyChart before we have a chance to review them but we will give you a call once they are reviewed by [place]. If we ordered any referrals today, please let us know if you have not heard from their office within the next week.   Please try these tips to maintain a healthy lifestyle:  Eat most of your calories during the day when you are active. Eliminate processed foods including packaged sweets (pies, cakes, cookies), reduce intake of potatoes, white bread, white pasta, and white rice. Look for whole grain options, oat flour or almond flour.  Each meal should contain half fruits/vegetables, one quarter protein, and one quarter carbs (no bigger than a computer mouse).  Cut down on sweet beverages. This includes juice, soda, and sweet tea. Also watch fruit intake, though this is a healthier sweet option, it still contains natural sugar! Limit to 3 servings daily.  Drink at least 1 glass of water with each meal and aim for at least 8 glasses per day  Exercise at least 150 minutes every week.

## 2021-12-22 NOTE — Progress Notes (Signed)
Subjective:     Patient ID: Debra Rhodes, female    DOB: 08/31/1961, 61 y.o.   MRN: [id number]  Chief Complaint  Patient presents with   Nasal Congestion    With greenish/yellow color   Cough   Ear Fullness   Sinusitis   Headache    With low grade fever.     HPI: Sinusitis: Patient complains of congestion, facial pain, low grade fever, nasal congestion, purulent nasal discharge, and loss of sense of smell/taste . Onset of symptoms was 7 days ago, unchanged since that time. She is drinking moderate amounts of fluids.  Past history is significant for no history of pneumonia or bronchitis.    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     DULoxetine (CYMBALTA) 30 MG capsule Take 1 capsule (30 mg total) by mouth daily. Take with a 60 mg capsule to equal total daily dose of 90 mg 90 capsule 0   DULoxetine (CYMBALTA) 60 MG capsule Take 1 capsule (60 mg total) by mouth daily. Take with a 30 mg capsule to equal total daily dose of 90 mg. 90 capsule 0   estradiol-norethindrone (COMBIPATCH) 0.05-0.14 MG/DAY Place 1 patch onto the skin once a week.     gabapentin (NEURONTIN) 100 MG capsule Take 100 mg in the morning and 100 mg at noon, then take (2) capsules 200 mg in the evening. 180 capsule 2   Lemborexant (DAYVIGO) 5 MG TABS Take 5 mg by mouth at bedtime. 30 tablet 2    Methylphenidate HCl ER, PM, (JORNAY PM) 20 MG CP24 Take 20 mg by mouth every evening. 30 capsule 0   Multiple Vitamin (MULTIVITAMIN) tablet Take 1 tablet by mouth daily.     thiamine 100 MG tablet Take 1 tablet (100 mg total) by mouth daily. 30 tablet 0   traZODone (DESYREL) 50 MG tablet Take 50 mg by mouth at bedtime.     naltrexone (DEPADE) 50 MG tablet Take by mouth.     No facility-administered medications prior to visit.    Allergies  Allergen Reactions   Latex Shortness Of Breath, Swelling and Rash        Objective:    Physical Exam Vitals and nursing note reviewed.  Constitutional:      Appearance: Normal appearance.  HENT:     Right Ear: Ear canal and external ear normal. Tenderness present. A middle ear effusion (mild) is present. Tympanic membrane is not injected, erythematous or bulging.     Left Ear: Tympanic membrane, ear canal and external ear normal.     Nose:     Right Sinus: Frontal sinus tenderness present.     Left Sinus: Frontal sinus tenderness present.     Mouth/Throat:     Mouth: Mucous membranes are moist.  Pharynx: No pharyngeal swelling, oropharyngeal exudate or posterior oropharyngeal erythema.  Cardiovascular:     Rate and Rhythm: Normal rate and regular rhythm.  Pulmonary:     Effort: Pulmonary effort is normal.     Breath sounds: Normal breath sounds.  Musculoskeletal:        General: Normal range of motion.  Skin:    General: Skin is warm and dry.  Neurological:     Mental Status: She is alert.  Psychiatric:        Mood and Affect: Mood normal.        Behavior: Behavior normal.    BP 105/70    Pulse 69    Temp 97.8 F (36.6 C) (Temporal)    Ht 5\' 4"  (1.626 m)    Wt 133 lb 6.4 oz (60.5 kg)    SpO2 100%    BMI 22.90 kg/m  Wt Readings from Last 3 Encounters:  12/22/21 133 lb 6.4 oz (60.5 kg)  10/26/21 135 lb (61.2 kg)  09/27/21 136 lb (61.7 kg)       Assessment & Plan:   Problem List Items Addressed This Visit        Respiratory   Acute non-recurrent frontal sinusitis - Primary    pressure vs. pain, loss of taste/smell, advised increasing Flonase to bid and saline nasal flush to tid, sending Augmentin, advised on use & SE, wait day or 2 before starting as may not need.      Relevant Medications   amoxicillin-clavulanate (AUGMENTIN) 875-125 MG tablet    Meds ordered this encounter  Medications   amoxicillin-clavulanate (AUGMENTIN) 875-125 MG tablet    Sig: Take 1 tablet by mouth 2 (two) times daily after a meal.    Dispense:  10 tablet    Refill:  0    Order Specific Question:   Supervising Provider    Answer:   ANDY, CAMILLE L [[id number]]

## 2021-12-23 ENCOUNTER — Ambulatory Visit (INDEPENDENT_AMBULATORY_CARE_PROVIDER_SITE_OTHER): Payer: Self-pay | Admitting: Psychiatry

## 2021-12-23 ENCOUNTER — Encounter: Payer: Self-pay | Admitting: Psychiatry

## 2021-12-23 DIAGNOSIS — F32A Depression, unspecified: Secondary | ICD-10-CM

## 2021-12-23 DIAGNOSIS — F411 Generalized anxiety disorder: Secondary | ICD-10-CM

## 2021-12-23 MED ORDER — BREXPIPRAZOLE 1 MG PO TABS: 1.0000 mg | ORAL_TABLET | Freq: Every day | ORAL | 1 refills | Status: DC

## 2021-12-23 MED ORDER — DULOXETINE HCL 60 MG PO CPEP: 60.0000 mg | ORAL_CAPSULE | Freq: Every day | ORAL | 0 refills | Status: DC

## 2021-12-23 MED ORDER — DULOXETINE HCL 30 MG PO CPEP: 30.0000 mg | ORAL_CAPSULE | Freq: Every day | ORAL | 0 refills | Status: DC

## 2021-12-23 NOTE — Progress Notes (Signed)
Debra Rhodes [id number] 1961-09-05 61 y.o.  Subjective:   Patient ID:  Debra Rhodes is a 61 y.o. (DOB September 09, 1961) female.  Chief Complaint:  Chief Complaint  Patient presents with   Depression   Follow-up    Anxiety and ETOH dependence    HPI Debra Rhodes presents to the office today for follow-up of anxiety, depression, and ETOH dependence. She reports that she had a relapse on ETOH after missing medications and was hospitalized. She is now on Naltrexone. She has been doing a PHP program at [facility] and has now transitioned to IOP.   She reports, "I have had some dark days, even without substance abuse." She reports, "I'm sad a lot, still." She reports depressed mood and low motivation. She reports that certain days she will start crying easily. She reports that she has also had high anxiety. She reports that she has increased anxiety in social situations. She felt the start of panic in a crowded environment. She reports chronic anxious thoughts and worry. She reports frequently thinking about the future. Rumination and some occasional catastrophic thoughts. Energy is low. Some irritability at times. Sleeping ok. Appetite is decreased. She reports that she continues to eat an adequate amount. She reports that her memory has been "horrible." Concentration is ok. Denies SI.   Denies ETOH cravings.   She is currently on leave from work.   She reports that she has been more consistent with medication.    Past Psychiatric Medication Trials: Paxil- Has taken it since 61 yo. Has had times where it has been less effective. Had wt gain and sexual side effects at higher doses (40 mg and 60 mg). Prozac- felt more anxious Sertraline Cymbalta- Started on 40 mg and ws increased to 60 mg. Wellbutrin- increased irritability Trazodone-helpful but causes some excessive somnolence, especially at 100 mg dose.  Remeron- Effective, wt gain Hydroxyzine Gabapentin- Takes as needed for anxiety  and leg cramps. Occ word finding errors with more than 300 mg Buspar-Increased agitation and anxiety Concerta- Had increased anxiety Strattera- Effective but feels "wired" Dayvigo-effective  PHQ2-9    Flowsheet Row ED from 10/26/2021 in [facility]  PHQ-2 Total Score 4  PHQ-9 Total Score 13      Flowsheet Row ED from 10/26/2021 in [facility] ED from 07/07/2021 in [facility] ED from 01/17/2021 in [facility]  C-SSRS RISK CATEGORY High Risk High Risk High Risk        Review of Systems:  Review of Systems  HENT:  Positive for sinus pressure and sinus pain.   Musculoskeletal:  Negative for gait problem.  Neurological:  Negative for tremors.  Psychiatric/Behavioral:         Please refer to HPI   Medications: I have reviewed the patient's current medications.  Current Outpatient Medications  Medication Sig Dispense Refill   amoxicillin-clavulanate (AUGMENTIN) 875-125 MG tablet Take 1 tablet by mouth 2 (two) times daily after a meal. 10 tablet 0   brexpiprazole (REXULTI) 1 MG TABS tablet Take 1 tablet (1 mg total) by mouth daily. 30 tablet 1   cetirizine (ZYRTEC) 10 MG tablet Take 10 mg by mouth daily.     estradiol-norethindrone (COMBIPATCH) 0.05-0.14 MG/DAY Place 1 patch onto the skin once a week.     gabapentin (NEURONTIN) 100 MG capsule Take 100 mg in the morning and 100 mg at noon, then take (2) capsules 200 mg in the evening. 180 capsule  2   Multiple Vitamin (MULTIVITAMIN) tablet Take 1 tablet by mouth daily.     naltrexone (DEPADE) 50 MG tablet Take by mouth.     thiamine 100 MG tablet Take 1 tablet (100 mg total) by mouth daily. 30 tablet 0   traZODone (DESYREL) 50 MG tablet Take 50 mg by mouth at bedtime.     DULoxetine (CYMBALTA) 30 MG capsule Take 1 capsule (30 mg total) by mouth daily. Take with a 60 mg capsule to equal total daily dose of 90 mg 90  capsule 0   DULoxetine (CYMBALTA) 60 MG capsule Take 1 capsule (60 mg total) by mouth daily. Take with a 30 mg capsule to equal total daily dose of 90 mg. 90 capsule 0   No current facility-administered medications for this visit.    Medication Side Effects: Other: Feels "spacey" with Naltrexone and some excessive somnolence with Trazodone  Allergies:  Allergies  Allergen Reactions   Latex Shortness Of Breath, Swelling and Rash    Past Medical History:  Diagnosis Date   Anxiety    Depression    PONV (postoperative nausea and vomiting)     Past Medical History, Surgical history, Social history, and Family history were reviewed and updated as appropriate.   Please see review of systems for further details on the patient's review from today.   Objective:   Physical Exam:  There were no vitals taken for this visit.  Physical Exam Constitutional:      General: She is not in acute distress. Musculoskeletal:        General: No deformity.  Neurological:     Mental Status: She is alert and oriented to person, place, and time.     Coordination: Coordination normal.  Psychiatric:        Attention and Perception: Attention and perception normal. She does not perceive auditory or visual hallucinations.        Mood and Affect: Mood is anxious and depressed. Affect is not labile, blunt, angry or inappropriate.        Speech: Speech normal.        Behavior: Behavior normal.        Thought Content: Thought content normal. Thought content is not paranoid or delusional. Thought content does not include homicidal or suicidal ideation. Thought content does not include homicidal or suicidal plan.        Cognition and Memory: Cognition and memory normal.        Judgment: Judgment normal.     Comments: Insight intact    Lab Review:     Component Value Date/Time   NA 139 10/26/2021 0824   K 4.1 10/26/2021 0824   CL 102 10/26/2021 0824   CO2 26 10/26/2021 0824   GLUCOSE 107 (H)  10/26/2021 0824   BUN 10 10/26/2021 0824   CREATININE 0.67 10/26/2021 0824   CALCIUM 9.8 10/26/2021 0824   PROT 6.8 10/26/2021 0824   ALBUMIN 4.4 10/26/2021 0824   AST 27 10/26/2021 0824   ALT 15 10/26/2021 0824   ALKPHOS 44 10/26/2021 0824   BILITOT 0.6 10/26/2021 0824   GFRNONAA >60 10/26/2021 0824   GFRAA >60 05/14/2019 0317       Component Value Date/Time   WBC 5.8 10/26/2021 0824   RBC 4.26 10/26/2021 0824   HGB 13.2 10/26/2021 0824   HCT 39.3 10/26/2021 0824   PLT 263 10/26/2021 0824   MCV 92.3 10/26/2021 0824   MCH 31.0 10/26/2021 0824   MCHC 33.6 10/26/2021 0824  RDW 12.5 10/26/2021 0824   LYMPHSABS 0.9 10/26/2021 0824   MONOABS 0.3 10/26/2021 0824   EOSABS 0.0 10/26/2021 0824   BASOSABS 0.1 10/26/2021 0824    No results found for: POCLITH, LITHIUM   No results found for: PHENYTOIN, PHENOBARB, VALPROATE, CBMZ   .res Assessment: Plan:   Pt seen for 30 minutes and time spent discussing possible treatment options for depression since she reports that she continues to have depressive s/s. She asks about Pristiq or augmentation with Abilify. Discussed potential metabolic side effects associated with atypical antipsychotics, as well as potential risk for movement side effects. Advised pt to contact office if movement side effects occur. Discussed potential benefits, risks, and side effects of Rexulti. Pt agrees to trial of Rexulti. Discussed considering trial of Pristiq in the future if depressive s/s do not improve.  Will start Rexulti 0.5 mg po qd for one week, then increase to 1 mg po qd for augmentation of depression.  Continue Duloxetine 90 mg po qd for depression and anxiety.  Continue Naltrexone 50 mg po qd for ETOH cravings.  Continue Trazodone 50 mg po QHS for insomnia.  Pt to follow-up in 4 weeks or sooner if clinically indicated.  She plans to continue IOP program at [facility] and then resume therapy with Rinaldo Cloud, LCSW at completion of IOP.  Patient  advised to contact office with any questions, adverse effects, or acute worsening in signs and symptoms.   Maygan was seen today for depression and follow-up.  Diagnoses and all orders for this visit:  Depression, unspecified depression type -     brexpiprazole (REXULTI) 1 MG TABS tablet; Take 1 tablet (1 mg total) by mouth daily. -     DULoxetine (CYMBALTA) 30 MG capsule; Take 1 capsule (30 mg total) by mouth daily. Take with a 60 mg capsule to equal total daily dose of 90 mg -     DULoxetine (CYMBALTA) 60 MG capsule; Take 1 capsule (60 mg total) by mouth daily. Take with a 30 mg capsule to equal total daily dose of 90 mg.  Generalized anxiety disorder -     DULoxetine (CYMBALTA) 30 MG capsule; Take 1 capsule (30 mg total) by mouth daily. Take with a 60 mg capsule to equal total daily dose of 90 mg -     DULoxetine (CYMBALTA) 60 MG capsule; Take 1 capsule (60 mg total) by mouth daily. Take with a 30 mg capsule to equal total daily dose of 90 mg.     Please see After Visit Summary for patient specific instructions.  Future Appointments  Date Time Provider [place]  01/18/2022  4:00 PM Shanon Ace, LCSW [facility] None  01/20/2022  8:30 AM Thayer Headings, PMHNP [facility] None  09/28/2022  8:00 AM Jeanie Sewer, NP [facility]    No orders of the defined types were placed in this encounter.   -------------------------------

## 2021-12-26 DIAGNOSIS — F411 Generalized anxiety disorder: Secondary | ICD-10-CM | POA: Diagnosis not present

## 2021-12-27 DIAGNOSIS — F411 Generalized anxiety disorder: Secondary | ICD-10-CM | POA: Diagnosis not present

## 2021-12-28 DIAGNOSIS — F411 Generalized anxiety disorder: Secondary | ICD-10-CM | POA: Diagnosis not present

## 2021-12-29 DIAGNOSIS — F411 Generalized anxiety disorder: Secondary | ICD-10-CM | POA: Diagnosis not present

## 2021-12-30 ENCOUNTER — Telehealth: Payer: Self-pay

## 2021-12-30 DIAGNOSIS — F411 Generalized anxiety disorder: Secondary | ICD-10-CM | POA: Diagnosis not present

## 2021-12-30 NOTE — Telephone Encounter (Signed)
Prior Authorization submitted and approved for REXULTI 1 MG effective 12/30/2021-12/30/2022 PA# [id number] with [employer]

## 2022-01-02 DIAGNOSIS — F411 Generalized anxiety disorder: Secondary | ICD-10-CM | POA: Diagnosis not present

## 2022-01-03 DIAGNOSIS — F411 Generalized anxiety disorder: Secondary | ICD-10-CM | POA: Diagnosis not present

## 2022-01-04 DIAGNOSIS — F411 Generalized anxiety disorder: Secondary | ICD-10-CM | POA: Diagnosis not present

## 2022-01-13 ENCOUNTER — Ambulatory Visit: Payer: Self-pay | Admitting: Psychiatry

## 2022-01-18 ENCOUNTER — Other Ambulatory Visit: Payer: Self-pay

## 2022-01-18 ENCOUNTER — Ambulatory Visit (INDEPENDENT_AMBULATORY_CARE_PROVIDER_SITE_OTHER): Payer: BC Managed Care – PPO | Admitting: Psychiatry

## 2022-01-18 DIAGNOSIS — F411 Generalized anxiety disorder: Secondary | ICD-10-CM | POA: Diagnosis not present

## 2022-01-18 NOTE — Progress Notes (Signed)
?    [facility] Counselor/Therapist Progress Note ? ?Patient ID: Debra Rhodes, MRN: [id number],   ? ?Date: 01/18/2022 ? ?Time Spent: 50 minutes  ? ?Treatment Type: Individual Therapy ? ?Reported Symptoms: anxiety is stronger symptom, with some depression, denies any SI  ? ?Mental Status Exam: ? ?Appearance:   Neat     ?Behavior:  Appropriate, Sharing, and Motivated  ?Motor:  Normal  ?Speech/Language:   WNL  ?Affect:  Anxious (mild)  ?Mood:  anxious  ?Thought process:  goal directed  ?Thought content:    WNL  ?Sensory/Perceptual disturbances:    WNL  ?Orientation:  oriented to person, place, time/date, situation, day of week, month of year, year, and stated date of January 18, 2022  ?Attention:  Good  ?Concentration:  Good  ?Memory:  WNL  ?Fund of knowledge:   Good  ?Insight:    Good and Fair  ?Judgment:   Good  ?Impulse Control:  Good  ? ?Risk Assessment: ?Danger to Self:  No ?Self-injurious Behavior: No ?Danger to Others: No ?Duty to Warn:no ?Physical Aggression / Violence:No  ?Access to Firearms a concern: No  ?Gang Involvement:No  ? ?Subjective:  Patient in today reporting anxiety as main symptom, and also some depression. On 1-10 scale today she rates herself as an "8". Have been more anxious as this was first traveling day back at work. On a 1-10 depression scale she rates herself a "5". Finished the DBT based program at [facility] and reports staying sober, med switch has helped with the addition of Rexulti, more confidence, and feeling less of being "odd man out" as I was with other people working on similar issues. "Did DBT, movement therapy, music therapy, and art therapy. Themes of radical acceptance of myself and circumstances I don't like, diverting thoughts that are unhelpful. "Felt like I completely fit in with the group there." Following up with AA x5 weekly. Also getting involved with a church and meeting people there. Fears of having left the group environment in the [place] group treatment as it  "was a safe environment."  "Fears about that are decreasing."  Did discuss ways of staying connected with supportive people, staying in the moment.  "Patience is not a virtue for me" and we discussed ways to be more patient and stay in the present. ? ?Interventions: Cognitive Behavioral Therapy and Solution-Oriented/Positive Psychology ? ?Treatment Plan: ?Patient not signing treatment plan on computer screen due to [place]. ?Treatment Goals: ?Treatment goals remain on treatment plan while patient works with strategies to achieve her goals.  Progress is assessed each session and noted in the "progress" section of treatment note. ?Long term goal: ?Develop healthy cognitive patterns and beliefs about self in the world that lead to alleviation and help prevent the relapse of depressive and anxiety symptoms. ?Short term goal: ?Verbally express understanding the relationship between depressed mood and repression of feelings such as anger hurt and sadness. ?Strategies: ?Replace negative self-defeating self talk with verbalization of realistic and positive cognitive messages that do not feed anxiety nor depression. ?  ?Diagnosis: ?  ICD-10-CM   ?1. Generalized anxiety disorder  F41.1   ?  ? ? ?Plan: Patient today showing good motivation and participation in session focusing on transitioning out of the outpatient program she has been in and "back to my regular life." Discussed how we might pick up in individual therapy that will help her most in maintaining her gains and move forward. Planning to discuss healthier ways of coping with her anxiety  and not being impulsive and next session. Seems more motivated and understands she will have challenges along the way.  To continue AA mtgs x5 weekly, daily contact with AA sponsor, attending her church, and staying in touch with a brother, her 3 daughters, and supportive friends. Encouraged patient in her working to have more positive behaviors to support her remaining alcohol-free  including: Not purchasing any alcohol, not accepting alcohol from others, to remain in daily contact with her [place] sponsor, attend daily AA meetings, set limits with others as she needs to including significant other, positive self talk more consistently, practicing good self-care, continuing her exercises even when she cannot be at the gym by using alternative exercises, remaining in the present focusing on what she can control, saying no when she needs to say no, and recognizing her positive strength when she works with goal-directed behaviors to move in a direction that supports her sobriety and improved emotional health and wellbeing. ? ? ?Goal review and progress/challenges noted with patient. ? ?Next appointment within 2 weeks. ? ?This record has been created using [employer].  Chart creation errors have been sought, but may not always have been located and corrected.  Such creation errors do not reflect on the standard of medical care provided. ? ? ?Shanon Ace, LCSW ? ? ? ? ? ? ? ? ? ? ? ? ? ? ? ? ? ? ?

## 2022-01-19 DIAGNOSIS — L570 Actinic keratosis: Secondary | ICD-10-CM | POA: Diagnosis not present

## 2022-01-20 ENCOUNTER — Telehealth: Payer: Self-pay | Admitting: Psychiatry

## 2022-01-20 ENCOUNTER — Other Ambulatory Visit: Payer: Self-pay

## 2022-01-20 ENCOUNTER — Ambulatory Visit (INDEPENDENT_AMBULATORY_CARE_PROVIDER_SITE_OTHER): Payer: BC Managed Care – PPO | Admitting: Psychiatry

## 2022-01-20 ENCOUNTER — Encounter: Payer: Self-pay | Admitting: Psychiatry

## 2022-01-20 DIAGNOSIS — F411 Generalized anxiety disorder: Secondary | ICD-10-CM

## 2022-01-20 DIAGNOSIS — G4700 Insomnia, unspecified: Secondary | ICD-10-CM | POA: Diagnosis not present

## 2022-01-20 DIAGNOSIS — F902 Attention-deficit hyperactivity disorder, combined type: Secondary | ICD-10-CM | POA: Diagnosis not present

## 2022-01-20 DIAGNOSIS — F32A Depression, unspecified: Secondary | ICD-10-CM | POA: Diagnosis not present

## 2022-01-20 MED ORDER — NALTREXONE HCL 50 MG PO TABS: 50.0000 mg | ORAL_TABLET | Freq: Every day | ORAL | 1 refills | Status: DC

## 2022-01-20 MED ORDER — TRAZODONE HCL 50 MG PO TABS: 50.0000 mg | ORAL_TABLET | Freq: Every day | ORAL | 0 refills | Status: DC

## 2022-01-20 MED ORDER — JORNAY PM 20 MG PO CP24: 20.0000 mg | ORAL_CAPSULE | Freq: Every evening | ORAL | 0 refills | Status: DC

## 2022-01-20 MED ORDER — BREXPIPRAZOLE 2 MG PO TABS: 2.0000 mg | ORAL_TABLET | Freq: Every day | ORAL | 1 refills | Status: DC

## 2022-01-20 NOTE — Progress Notes (Signed)
Debra Rhodes [id number] 06/30/1961 61 y.o.  Subjective:   Patient ID:  Debra Rhodes is a 61 y.o. (DOB [birth year]) female.  Chief Complaint:  Chief Complaint  Patient presents with   Anxiety    HPI Debra Rhodes presents to the office today for follow-up of anxiety, depression, insomnia, ADHD, and ETOH dependence. She reports feeling "better, much better." She feels that Debra Rhodes has been helpful. She reports that she re-started Jornay PM to help with distractibility and concentration. She reports that she notices increased anxious and negative thoughts while traveling for work and waiting in [place] offices to meet with clients. She reports working 10-12 hours a day due to travel and that this causes fatigue. She reports that depression is "not gone, but much better." She reports that her anxiety remains high and she has been using Gabapentin more. She reports that if she takes more than 300 mg she experiences some drowsiness. Has been reducing caffeine intake. Denies any change in appetite. Has had some difficulty with sleep after returning to work. Motivation has been ok. She reports fleeting suicidal thoughts/passive death wishes and describes these as intrusive thoughts. She adamantly denies suicidal intent.  She reports that she continues to have cravings for ETOH.   Past Psychiatric Medication Trials: Paxil- Has taken it since 61 yo. Has had times where it has been less effective. Had wt gain and sexual side effects at higher doses (40 mg and 60 mg). Prozac- felt more anxious Sertraline Cymbalta- Started on 40 mg and ws increased to 60 mg. Wellbutrin- increased irritability Trazodone-helpful but causes some excessive somnolence, especially at 100 mg dose.  Remeron- Effective, wt gain Hydroxyzine Gabapentin- Takes as needed for anxiety and leg cramps. Occ word finding errors with more than 300 mg Buspar-Increased agitation and anxiety Concerta- Had increased anxiety Strattera-  Effective but feels "wired" Dayvigo-effective  PHQ2-9    Flowsheet Row ED from 10/26/2021 in [facility]  PHQ-2 Total Score 4  PHQ-9 Total Score 13      Flowsheet Row ED from 10/26/2021 in [facility] ED from 07/07/2021 in [facility] ED from 01/17/2021 in [facility]  C-SSRS RISK CATEGORY High Risk High Risk High Risk        Review of Systems:  Review of Systems  Musculoskeletal:  Negative for gait problem.  Neurological:  Negative for tremors.  Psychiatric/Behavioral:         Please refer to HPI   Medications: I have reviewed the patient's current medications.  Current Outpatient Medications  Medication Sig Dispense Refill   cetirizine (ZYRTEC) 10 MG tablet Take 10 mg by mouth daily.     DULoxetine (CYMBALTA) 30 MG capsule Take 1 capsule (30 mg total) by mouth daily. Take with a 60 mg capsule to equal total daily dose of 90 mg 90 capsule 0   DULoxetine (CYMBALTA) 60 MG capsule Take 1 capsule (60 mg total) by mouth daily. Take with a 30 mg capsule to equal total daily dose of 90 mg. 90 capsule 0   estradiol-norethindrone (COMBIPATCH) 0.05-0.14 MG/DAY Place 1 patch onto the skin once a week.     gabapentin (NEURONTIN) 100 MG capsule Take 100 mg in the morning and 100 mg at noon, then take (2) capsules 200 mg in the evening. 180 capsule 2   Methylphenidate HCl ER, PM, (JORNAY PM) 20 MG CP24 Take by mouth.     Multiple Vitamin (MULTIVITAMIN) tablet Take 1  tablet by mouth daily.     brexpiprazole (Debra Rhodes) 2 MG TABS tablet Take 1 tablet (2 mg total) by mouth daily. 90 tablet 1   Methylphenidate HCl ER, PM, (JORNAY PM) 20 MG CP24 Take 20 mg by mouth every evening. 30 capsule 0   naltrexone (DEPADE) 50 MG tablet Take 1 tablet (50 mg total) by mouth daily. 90 tablet 1   thiamine 100 MG tablet Take 1 tablet (100 mg total) by mouth daily. (Patient not taking: Reported  on 01/20/2022) 30 tablet 0   traZODone (DESYREL) 50 MG tablet Take 1 tablet (50 mg total) by mouth at bedtime. 90 tablet 0   No current facility-administered medications for this visit.    Medication Side Effects: None  Allergies:  Allergies  Allergen Reactions   Latex Shortness Of Breath, Swelling and Rash    Past Medical History:  Diagnosis Date   Anxiety    Depression    PONV (postoperative nausea and vomiting)     Past Medical History, Surgical history, Social history, and Family history were reviewed and updated as appropriate.   Please see review of systems for further details on the patient's review from today.   Objective:   Physical Exam:  There were no vitals taken for this visit.  Physical Exam Constitutional:      General: She is not in acute distress. Musculoskeletal:        General: No deformity.  Neurological:     Mental Status: She is alert and oriented to person, place, and time.     Coordination: Coordination normal.  Psychiatric:        Attention and Perception: Attention and perception normal. She does not perceive auditory or visual hallucinations.        Mood and Affect: Mood is anxious. Mood is not depressed. Affect is not labile, blunt, angry or inappropriate.        Speech: Speech normal.        Behavior: Behavior normal.        Thought Content: Thought content normal. Thought content is not paranoid or delusional. Thought content does not include homicidal or suicidal ideation. Thought content does not include homicidal or suicidal plan.        Cognition and Memory: Cognition and memory normal.        Judgment: Judgment normal.     Comments: Insight intact    Lab Review:     Component Value Date/Time   NA 139 10/26/2021 0824   K 4.1 10/26/2021 0824   CL 102 10/26/2021 0824   CO2 26 10/26/2021 0824   GLUCOSE 107 (H) 10/26/2021 0824   BUN 10 10/26/2021 0824   CREATININE 0.67 10/26/2021 0824   CALCIUM 9.8 10/26/2021 0824   PROT 6.8  10/26/2021 0824   ALBUMIN 4.4 10/26/2021 0824   AST 27 10/26/2021 0824   ALT 15 10/26/2021 0824   ALKPHOS 44 10/26/2021 0824   BILITOT 0.6 10/26/2021 0824   GFRNONAA >60 10/26/2021 0824   GFRAA >60 05/14/2019 0317       Component Value Date/Time   WBC 5.8 10/26/2021 0824   RBC 4.26 10/26/2021 0824   HGB 13.2 10/26/2021 0824   HCT 39.3 10/26/2021 0824   PLT 263 10/26/2021 0824   MCV 92.3 10/26/2021 0824   MCH 31.0 10/26/2021 0824   MCHC 33.6 10/26/2021 0824   RDW 12.5 10/26/2021 0824   LYMPHSABS 0.9 10/26/2021 0824   MONOABS 0.3 10/26/2021 0824   EOSABS 0.0 10/26/2021 8676  BASOSABS 0.1 10/26/2021 0824    No results found for: POCLITH, LITHIUM   No results found for: PHENYTOIN, PHENOBARB, VALPROATE, CBMZ   .res Assessment: Plan:   Pt seen for 30 minutes and time spent discussing the effect that travel has on her anxiety and possible work accommodations. Recommend that she only travel (more than 30 miles from home) 3 days a week or less. Discussed intrusive thoughts and ways to cope with these thoughts.  Discussed potential benefits, risks, and side effects of increasing Debra Rhodes to 2 mg daily. Pt agrees to increase in Debra Rhodes to 2 mg po qd to improve mood and anxiety s/s.  Continue Cymbalta 90 mg po qd for anxiety and depression.  Continue Jornay PM 20 mg po q evening for ADHD. Continue Trazodone 50 mg po QHS for insomnia.  Continue Naltrexone 50 mg po qd for ETOH cravings. Discussed considering Topamax in the future if continuing to have ETOH cravings.  Recommend continuing therapy with Rinaldo Cloud, LCSW.  Pt to follow-up in 4 weeks or sooner if clinically indicated.  Patient advised to contact office with any questions, adverse effects, or acute worsening in signs and symptoms.   Brenee was seen today for anxiety.  Diagnoses and all orders for this visit:  Generalized anxiety disorder  Attention deficit hyperactivity disorder (ADHD), combined type -      Methylphenidate HCl ER, PM, (JORNAY PM) 20 MG CP24; Take 20 mg by mouth every evening.  Depression, unspecified depression type -     brexpiprazole (Debra Rhodes) 2 MG TABS tablet; Take 1 tablet (2 mg total) by mouth daily.  Insomnia, unspecified type -     traZODone (DESYREL) 50 MG tablet; Take 1 tablet (50 mg total) by mouth at bedtime.  Other orders -     naltrexone (DEPADE) 50 MG tablet; Take 1 tablet (50 mg total) by mouth daily.     Please see After Visit Summary for patient specific instructions.  Future Appointments  Date Time Provider [place]  02/07/2022  8:00 AM Shanon Ace, LCSW [facility] None  02/17/2022  8:30 AM Thayer Headings, PMHNP [facility] None  02/21/2022  8:00 AM Shanon Ace, LCSW [facility] None  03/08/2022  5:00 PM Shanon Ace, LCSW [facility] None  09/28/2022  8:00 AM Jeanie Sewer, NP [facility]    No orders of the defined types were placed in this encounter.   -------------------------------

## 2022-01-20 NOTE — Telephone Encounter (Signed)
Pt notified, letter at front desk to pick up from [employer].Pt will pick up Monday 3/6  ?

## 2022-02-07 ENCOUNTER — Ambulatory Visit (INDEPENDENT_AMBULATORY_CARE_PROVIDER_SITE_OTHER): Payer: Self-pay | Admitting: Psychiatry

## 2022-02-07 DIAGNOSIS — F411 Generalized anxiety disorder: Secondary | ICD-10-CM

## 2022-02-07 NOTE — Progress Notes (Signed)
No Show fee reduced to $50 ?

## 2022-02-17 ENCOUNTER — Ambulatory Visit (INDEPENDENT_AMBULATORY_CARE_PROVIDER_SITE_OTHER): Payer: BC Managed Care – PPO | Admitting: Psychiatry

## 2022-02-17 ENCOUNTER — Encounter: Payer: Self-pay | Admitting: Psychiatry

## 2022-02-17 DIAGNOSIS — F32A Depression, unspecified: Secondary | ICD-10-CM

## 2022-02-17 DIAGNOSIS — F411 Generalized anxiety disorder: Secondary | ICD-10-CM

## 2022-02-17 DIAGNOSIS — F902 Attention-deficit hyperactivity disorder, combined type: Secondary | ICD-10-CM

## 2022-02-17 DIAGNOSIS — G4700 Insomnia, unspecified: Secondary | ICD-10-CM

## 2022-02-17 MED ORDER — DULOXETINE HCL 60 MG PO CPEP: 60.0000 mg | ORAL_CAPSULE | Freq: Every day | ORAL | 0 refills | Status: DC

## 2022-02-17 MED ORDER — TRAZODONE HCL 50 MG PO TABS: ORAL_TABLET | ORAL | 0 refills | Status: DC

## 2022-02-17 MED ORDER — JORNAY PM 20 MG PO CP24: 20.0000 mg | ORAL_CAPSULE | Freq: Every evening | ORAL | 0 refills | Status: DC

## 2022-02-17 MED ORDER — DULOXETINE HCL 30 MG PO CPEP: 30.0000 mg | ORAL_CAPSULE | Freq: Every day | ORAL | 0 refills | Status: DC

## 2022-02-17 NOTE — Progress Notes (Signed)
Debra Rhodes ?[id number] ?13-Apr-1961 ?61 y.o. ? ?Subjective:  ? ?Patient ID:  Debra Rhodes is a 61 y.o. (DOB [birth year]) female. ? ?Chief Complaint:  ?Chief Complaint  ?Patient presents with  ? Anxiety  ? Follow-up  ?  Depression, insomnia, ADHD, and ETOH dependence  ? ? ?HPI ?Debra Rhodes presents to the office today for follow-up of anxiety, depression, insomnia, ADHD, and ETOH dependence. She reports that her sad mood has significantly improved. She reports that her anxiety has been elevated. She has decreased her caffeine intake (2 cups of coffee daily to 1 cup of coffee). She has a new job and has some anxiety about this.  She had anxiety with submitting resignation. She notices anxious thoughts. She reports catastrophic thoughts and some rumination. She reports that she will take 2 gabapentin prior to social gatherings or [place] meetings. Has had increased appetite with increased stress and has been eating throughout the day.  ? ?Has been taking 1.5 tabs of Trazodone since she has had difficulty with sleep initiation and "quiet my head down." Has been waking up around 6 am without an alarm. Energy and motivation have been fine. Has had difficulty with concentration. She reports that concentration has been adequate with [place] PM. She reports some fleeting passive death wishes. Denies SI.  ? ?Has been trying to exercise more.  ? 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 She will have minimal travel and will be going into homes. She will start 03/01/22. Will be going into an office daily, Monday-Friday.  ? ?She has been sober almost 4 months. She has had some ETOH cravings with increased stress. Has been talking more to her sponsor and going to meetings almost daily. Has been increasing intake of sweets.  ? ?Past Psychiatric Medication Trials: ?Paxil- Has taken it since 61 yo. Has had times where it has been less effective. Had wt gain and sexual side effects at higher doses (40 mg and 60  mg). ?Prozac- felt more anxious ?Sertraline ?Cymbalta- Started on 40 mg and ws increased to 60 mg. ?Wellbutrin- increased irritability ?Trazodone-helpful but causes some excessive somnolence, especially at 100 mg dose.  ?Remeron- Effective, wt gain ?Hydroxyzine ?Gabapentin- Takes as needed for anxiety and leg cramps. Occ word finding errors with more than 300 mg ?Buspar-Increased agitation and anxiety ?Concerta- Had increased anxiety ?Strattera- Effective but feels "wired" ?[place]igo-effective ? ?AIMS   ? ?[facility] Office Visit from 02/17/2022 in [facility]  ?AIMS Total Score 1  ? ?  ? ?PHQ2-9   ? ?[place] [facility] from 10/26/2021 in [facility]  ?PHQ-2 Total Score 4  ?PHQ-9 Total Score 13  ? ?  ? ?[place] ED from 10/26/2021 in [facility] ED from 07/07/2021 in [facility] ED from 01/17/2021 in [facility]  ?C-SSRS RISK CATEGORY High Risk High Risk High Risk  ? ?  ?  ? ?Review of Systems:  ?Review of Systems  ?Musculoskeletal:  Negative for gait problem.  ?Neurological:  Negative for tremors.  ?Psychiatric/Behavioral:    ?     Please refer to HPI  ? ?Medications: I have reviewed the patient's current medications. ? ?Current Outpatient Medications  ?Medication Sig Dispense Refill  ? brexpiprazole (REXULTI) 2 MG TABS tablet Take 1 tablet (2 mg total) by mouth daily. 90 tablet 1  ? cetirizine (ZYRTEC) 10 MG tablet Take 10 mg by mouth daily.    ?  gabapentin (NEURONTIN) 100 MG capsule Take 100 mg in the morning and 100 mg at noon, then take (2) capsules 200 mg in the evening. 180 capsule 2  ? Methylphenidate HCl ER, PM, (JORNAY PM) 20 MG CP24 Take 20 mg by mouth every evening. 30 capsule 0  ? Multiple Vitamin (MULTIVITAMIN) tablet Take 1 tablet by mouth daily.    ? naltrexone (DEPADE) 50 MG tablet Take 1 tablet (50 mg total) by mouth daily. 90 tablet 1  ? DULoxetine (CYMBALTA)  30 MG capsule Take 1 capsule (30 mg total) by mouth daily. Take with a 60 mg capsule to equal total daily dose of 90 mg 90 capsule 0  ? DULoxetine (CYMBALTA) 60 MG capsule Take 1 capsule (60 mg total) by mouth daily. Take with a 30 mg capsule to equal total daily dose of 90 mg. 90 capsule 0  ? estradiol-norethindrone (COMBIPATCH) 0.05-0.14 MG/DAY Place 1 patch onto the skin once a week.    ? Methylphenidate HCl ER, PM, (JORNAY PM) 20 MG CP24 Take 20 mg by mouth every evening. 90 capsule 0  ? thiamine 100 MG tablet Take 1 tablet (100 mg total) by mouth daily. (Patient not taking: Reported on 01/20/2022) 30 tablet 0  ? traZODone (DESYREL) 50 MG tablet Take 1-2 tabs at bedtime as needed for insomnia 180 tablet 0  ? ?No current facility-administered medications for this visit.  ? ? ?Medication Side Effects: Involuntary movements in toes ? ?Allergies:  ?Allergies  ?Allergen Reactions  ? Latex Shortness Of Breath, Swelling and Rash  ? ? ?Past Medical History:  ?Diagnosis Date  ? Anxiety   ? Depression   ? PONV (postoperative nausea and vomiting)   ? ? ?Past Medical History, Surgical history, Social history, and Family history were reviewed and updated as appropriate.  ? ?Please see review of systems for further details on the patient's review from today.  ? ?Objective:  ? ?Physical Exam:  ?There were no vitals taken for this visit. ? ?Physical Exam ?Constitutional:   ?   General: She is not in acute distress. ?Musculoskeletal:     ?   General: No deformity.  ?Neurological:  ?   Mental Status: She is alert and oriented to person, place, and time.  ?   Coordination: Coordination normal.  ?Psychiatric:     ?   Attention and Perception: Attention and perception normal. She does not perceive auditory or visual hallucinations.     ?   Mood and Affect: Mood is anxious. Mood is not depressed. Affect is not labile, blunt, angry or inappropriate.     ?   Speech: Speech normal.     ?   Behavior: Behavior normal.     ?   Thought  Content: Thought content normal. Thought content is not paranoid or delusional. Thought content does not include homicidal or suicidal ideation. Thought content does not include homicidal or suicidal plan.     ?   Cognition and Memory: Cognition and memory normal.     ?   Judgment: Judgment normal.  ?   Comments: Insight intact  ? ? ?Lab Review:  ?   ?Component Value Date/Time  ? NA 139 10/26/2021 0824  ? K 4.1 10/26/2021 0824  ? CL 102 10/26/2021 0824  ? CO2 26 10/26/2021 0824  ? GLUCOSE 107 (H) 10/26/2021 0824  ? BUN 10 10/26/2021 0824  ? CREATININE 0.67 10/26/2021 0824  ? CALCIUM 9.8 10/26/2021 0824  ? PROT 6.8 10/26/2021 0824  ?  ALBUMIN 4.4 10/26/2021 0824  ? AST 27 10/26/2021 0824  ? ALT 15 10/26/2021 0824  ? ALKPHOS 44 10/26/2021 0824  ? BILITOT 0.6 10/26/2021 0824  ? GFRNONAA >60 10/26/2021 0824  ? GFRAA >60 05/14/2019 0317  ? ? ?   ?Component Value Date/Time  ? WBC 5.8 10/26/2021 0824  ? RBC 4.26 10/26/2021 0824  ? HGB 13.2 10/26/2021 0824  ? HCT 39.3 10/26/2021 0824  ? PLT 263 10/26/2021 0824  ? MCV 92.3 10/26/2021 0824  ? MCH 31.0 10/26/2021 0824  ? MCHC 33.6 10/26/2021 0824  ? RDW 12.5 10/26/2021 0824  ? LYMPHSABS 0.9 10/26/2021 0824  ? MONOABS 0.3 10/26/2021 0824  ? EOSABS 0.0 10/26/2021 0824  ? BASOSABS 0.1 10/26/2021 0824  ? ? ?No results found for: POCLITH, LITHIUM  ? ?No results found for: PHENYTOIN, PHENOBARB, VALPROATE, CBMZ  ? ?.res ?Assessment: Plan:   ? ?Pt seen for 30 minutes and time spent discussing response to increase in [place]. She reports improved depressive s/s and has experienced an increase in anxiety which she reports may be related to stress with transition of changing jobs. Discussed option of decreasing Rexulti to 1 mg since involuntary toe movements have started at higher dose and it is unclear if anxiety is worse at higher dose or continuing 2 mg dose since mood has improved and anxiety may be situational. Pt reports that she would like to continue Rexulti 2 mg po qd at this  time and re-evaluate in 6 weeks.  ?Continue Rexulti 2 mg po qd for augmentation of depression. ?Continue Cymbalta 90 mg po qd for anxiety and depression.  ?Continue Jornay PM 20 mg po q evening for ADHD. ?Continue

## 2022-02-21 ENCOUNTER — Ambulatory Visit (INDEPENDENT_AMBULATORY_CARE_PROVIDER_SITE_OTHER): Payer: BC Managed Care – PPO | Admitting: Psychiatry

## 2022-02-21 DIAGNOSIS — F411 Generalized anxiety disorder: Secondary | ICD-10-CM

## 2022-02-21 NOTE — Progress Notes (Signed)
?    [facility] Counselor[facility]Therapist Progress Note ? ?Patient ID: Debra Rhodes, MRN: [id number],   ? ?Date: 02/21/2022 ? ?Time Spent: 58 minutes  ? ?Treatment Type: Individual Therapy ? ?Reported Symptoms: anxiety, some depression, minimal SI and "last occurred over a week ago" ? ?Mental Status Exam: ? ?Appearance:   Casual     ?Behavior:  Appropriate, Sharing, and Motivated  ?Motor:  Normal  ?Speech/Language:   Clear and Coherent  ?Affect:  Anxious, some depression  ?Mood:  anxious and depressed  ?Thought process:  goal directed  ?Thought content:    Obsessions and overthinking  ?Sensory/Perceptual disturbances:    WNL  ?Orientation:  oriented to person, place, time/date, situation, day of week, month of year, year, and stated date of February 21, 2022  ?Attention:  Fair ("due to adhd)  ?Concentration:  Fair  ?Memory:  WNL  ?Fund of knowledge:   Good  ?Insight:    Good and Fair  ?Judgment:   Good and Fair  ?Impulse Control:  Fair  ? ?Risk Assessment: ?Danger to Self:  No ?Self-injurious Behavior: No ?Danger to Others: No ?Duty to Warn:no ?Physical Aggression / Violence:No  ?Access to Firearms a concern: No  ?Gang Involvement:No  ? ?Subjective:  Patient in today reporting anxiety, "not really having suicidal thoughts" , my adhd is still a problem at times but trying to cope better in remembering things and making reminder notes to self.  Today focusing on obsessive thought, job situation, and tendency to eat snacks or shop when I'm not drinking. Hard to just sit and be still and be ok. Looking for outlets when not drinking.  When upset or bored I need something to focus on rather than drinking. Last drank Dec. 7, 2022, but still having some cravings "even with my meds". Finds it hard at times to follow through on advice of her [place] sponsor, "hard to do things that don't bring me comfort". Processing some obsessive thoughts and resentment issues re: family history/relationships. Today self-reports that she is a "6" on 1-10  scale of anxiety. Staying involved with her current church helps her in staying connected to supportive people. 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 Encouraged her staying in contact with supportive people, stay in the present, follow through with AA sponsor's suggestions, practice doing thing that are good for her and her sobriety, and look for more positives vs negatives.  ? ?Interventions: Solution-Oriented/Positive Psychology and Insight-Oriented ? ?Treatment Plan: ?Patient not signing treatment plan on computer screen due to [place]. ?Treatment Goals: ?Treatment goals remain on treatment plan while patient works with strategies to achieve her goals.  Progress is assessed each session and noted in the "progress" section of treatment note. ?Long term goal: ?Develop healthy cognitive patterns and beliefs about self in the world that lead to alleviation and help prevent the relapse of depressive and anxiety symptoms. ?Short term goal: ?Verbally express understanding the relationship between depressed mood and repression of feelings such as anger hurt and sadness. ?Strategies: ?Replace negative self-defeating self talk with verbalization of realistic and positive cognitive messages that do not feed anxiety nor depression ? ?Diagnosis: ?  ICD-10-CM   ?1. Generalized anxiety disorder  F41.1   ?  ? ?Plan: Patient today showing motivation and good participation in session as she works to stay sober, "and I will be sober for months on April 7".  Worked on her perseverance and  sticking with making some changes that are not coming easy for her but are helping ensure her sobriety right now.  Has gotten a new job and starts next week and is excited about that.  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  Urged to continue her remaining  alcohol free, staying in touch with her [place] sponsor, and to step up her follow through on suggestions by [place] sponsor.  Trying to help her understand that "it will not feel easy right now" as that has been a bit of a problem for her but encouraged her to keep taking each day at the time and following through on the things that she knows support her sobriety.  To continue AA meetings x5 weekly, daily contact with [place] sponsor, attending her church, and staying in touch with other supportive people including her 3 daughters and a brother. Encouraged patient in her practice of more positive behaviors to support her remaining alcohol-free including: Not purchasing any alcohol, not accepting alcohol from others, to remain in daily contact with her [place] sponsor, attend daily AA meetings, set limits with others as she needs to including significant other, positive self talk more consistently, practicing good self-care, continuing her exercises even when she cannot be at the gym by using alternative exercises, remaining in the present focusing on what she can control, saying no when she needs to say no, and realizing her positive strength when she works with goal-directed behaviors to move in a direction that supports her sobriety, her improved emotional health, and overall wellbeing. ? ?Goal review and progress/challenges noted with patient. ? ?Next appointment within 2 to 3 weeks. ? ?This record has been created using [employer].  Chart creation errors have been sought, but may not always have been located and corrected.  Such creation errors do not reflect on the standard of medical care provided. ? ? ?Shanon Ace, LCSW ? ? ? ? ? ? ? ? ? ? ? ? ? ? ? ? ? ? ?

## 2022-02-22 ENCOUNTER — Ambulatory Visit (INDEPENDENT_AMBULATORY_CARE_PROVIDER_SITE_OTHER): Payer: BC Managed Care – PPO | Admitting: Family

## 2022-02-22 ENCOUNTER — Encounter: Payer: Self-pay | Admitting: Family

## 2022-02-22 VITALS — BP 101/65 | HR 67 | Temp 98.2°F | Ht 64.0 in | Wt 140.0 lb

## 2022-02-22 DIAGNOSIS — E611 Iron deficiency: Secondary | ICD-10-CM

## 2022-02-22 DIAGNOSIS — R5383 Other fatigue: Secondary | ICD-10-CM | POA: Diagnosis not present

## 2022-02-22 DIAGNOSIS — Z87448 Personal history of other diseases of urinary system: Secondary | ICD-10-CM

## 2022-02-22 DIAGNOSIS — F988 Other specified behavioral and emotional disorders with onset usually occurring in childhood and adolescence: Secondary | ICD-10-CM | POA: Insufficient documentation

## 2022-02-22 DIAGNOSIS — I7300 Raynaud's syndrome without gangrene: Secondary | ICD-10-CM | POA: Insufficient documentation

## 2022-02-22 DIAGNOSIS — F5101 Primary insomnia: Secondary | ICD-10-CM | POA: Insufficient documentation

## 2022-02-22 DIAGNOSIS — F1021 Alcohol dependence, in remission: Secondary | ICD-10-CM | POA: Insufficient documentation

## 2022-02-22 HISTORY — DX: Iron deficiency: E61.1

## 2022-02-22 LAB — CBC WITH DIFFERENTIAL/PLATELET
Basophils Absolute: 0.1 10*3/uL (ref 0.0–0.1)
Basophils Relative: 2.3 % (ref 0.0–3.0)
Eosinophils Absolute: 0.2 10*3/uL (ref 0.0–0.7)
Eosinophils Relative: 6.8 % — ABNORMAL HIGH (ref 0.0–5.0)
HCT: 38 % (ref 36.0–46.0)
Hemoglobin: 12.6 g/dL (ref 12.0–15.0)
Lymphocytes Relative: 36.4 % (ref 12.0–46.0)
Lymphs Abs: 1 10*3/uL (ref 0.7–4.0)
MCHC: 33.2 g/dL (ref 30.0–36.0)
MCV: 93.2 fl (ref 78.0–100.0)
Monocytes Absolute: 0.3 10*3/uL (ref 0.1–1.0)
Monocytes Relative: 10.3 % (ref 3.0–12.0)
Neutro Abs: 1.2 10*3/uL — ABNORMAL LOW (ref 1.4–7.7)
Neutrophils Relative %: 44.2 % (ref 43.0–77.0)
Platelets: 249 10*3/uL (ref 150.0–400.0)
RBC: 4.08 Mil/uL (ref 3.87–5.11)
RDW: 14.4 % (ref 11.5–15.5)
WBC: 2.7 10*3/uL — ABNORMAL LOW (ref 4.0–10.5)

## 2022-02-22 LAB — URINALYSIS, ROUTINE W REFLEX MICROSCOPIC
Bilirubin Urine: NEGATIVE
Hgb urine dipstick: NEGATIVE
Ketones, ur: NEGATIVE
Leukocytes,Ua: NEGATIVE
Nitrite: NEGATIVE
RBC / HPF: NONE SEEN (ref 0–?)
Specific Gravity, Urine: <=1.005 — AB (ref 1.000–1.030)
Total Protein, Urine: NEGATIVE
Urine Glucose: NEGATIVE
Urobilinogen, UA: 0.2 (ref 0.0–1.0)
WBC, UA: NONE SEEN (ref 0–?)
pH: 7 (ref 5.0–8.0)

## 2022-02-22 LAB — TSH: TSH: 1.52 u[IU]/mL (ref 0.35–5.50)

## 2022-02-22 LAB — VITAMIN D 25 HYDROXY (VIT D DEFICIENCY, FRACTURES): VITD: 36.23 ng/mL (ref 30.00–100.00)

## 2022-02-22 NOTE — Progress Notes (Signed)
? ?Subjective:  ? ? ? Patient ID: CINDEL DAUGHERTY, female    DOB: 1961/04/09, 61 y.o.   MRN: [id number] ? ?Chief Complaint  ?Patient presents with  ? Fatigue  ?  Pt c/o low energy and fatigue for about 3/4 weeks.   ? ?HPI: ?Fatigue ?Pt. reports onset: 3-4 weeks ago ?Hx of fatigue: yes - years ago ?Occurs upon awakening:  yes; during the day; evenings. ?Hours of sleep: avg 5h ?Works during: day shift ?New medications or change in current med doses: no ?Recent labs: no;  ?Exercise: yes ?High carb/sugar diet: sometimes ?Sx of depression: no  ? ?Health Maintenance Due  ?Topic Date Due  ? PAP SMEAR-Modifier  02/16/2022  ? ? ?Past Medical History:  ?Diagnosis Date  ? Anxiety   ? Depression   ? PONV (postoperative nausea and vomiting)   ? ? ?Past Surgical History:  ?Procedure Laterality Date  ? AUGMENTATION MAMMAPLASTY Bilateral 1996  ? BACK SURGERY    ? CESAREAN SECTION    ? FRACTURE SURGERY    ? 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 Procedure: TOTAL KNEE ARTHROPLASTY;  Surgeon: Paralee Cancel, MD;  Location: [facility];  Service: Orthopedics;  Laterality: Right;  70 mins  ? ? ?Outpatient Medications Prior to Visit  ?Medication Sig Dispense Refill  ? brexpiprazole (REXULTI) 2 MG TABS tablet Take 1 tablet (2 mg total) by mouth daily. 90 tablet 1  ? cetirizine (ZYRTEC) 10 MG tablet Take 10 mg by mouth daily.    ? DULoxetine (CYMBALTA) 30 MG capsule Take 1 capsule (30 mg total) by mouth daily. Take with a 60 mg capsule to equal total daily dose of 90 mg 90 capsule 0  ? DULoxetine (CYMBALTA) 60 MG capsule Take 1 capsule (60 mg total) by mouth daily. Take with a 30 mg capsule to equal total daily dose of 90 mg. 90 capsule 0  ? estradiol-norethindrone (COMBIPATCH) 0.05-0.14 MG/DAY Place 1 patch onto the skin once a week.    ? Ferrous Gluconate (IRON) 240 (27 Fe) MG TABS 1 tablet with water or juice between meals    ? gabapentin (NEURONTIN) 100 MG capsule Take 100 mg in the morning and 100 mg at noon, then take (2) capsules 200  mg in the evening. 180 capsule 2  ? Methylphenidate HCl ER, PM, (JORNAY PM) 20 MG CP24 Take 20 mg by mouth every evening. 30 capsule 0  ? Multiple Vitamin (MULTIVITAMIN) tablet Take 1 tablet by mouth daily.    ? naltrexone (DEPADE) 50 MG tablet Take 1 tablet (50 mg total) by mouth daily. 90 tablet 1  ? traZODone (DESYREL) 50 MG tablet Take 1-2 tabs at bedtime as needed for insomnia 180 tablet 0  ? Methylphenidate HCl ER, PM, (JORNAY PM) 20 MG CP24 Take 20 mg by mouth every evening. 90 capsule 0  ? thiamine 100 MG tablet Take 1 tablet (100 mg total) by mouth daily. (Patient not taking: Reported on 02/22/2022) 30 tablet 0  ? ?No facility-administered medications prior to visit.  ? ? ?Allergies  ?Allergen Reactions  ? Latex Shortness Of Breath, Swelling and Rash  ? ?   ?Objective:  ?  ?Physical Exam ?Vitals and nursing note reviewed.  ?Constitutional:   ?   Appearance: Normal appearance.  ?Cardiovascular:  ?   Rate and Rhythm: Normal rate and regular rhythm.  ?Pulmonary:  ?   Effort: Pulmonary effort is normal.  ?   Breath sounds: Normal breath sounds.  ?Musculoskeletal:     ?  General: Normal range of motion.  ?Skin: ?   General: Skin is warm and dry.  ?Neurological:  ?   Mental Status: She is alert.  ?Psychiatric:     ?   Mood and Affect: Mood normal.     ?   Behavior: Behavior normal.  ? ? ?BP 101/65 (BP Location: Left Arm, Patient Position: Sitting, Cuff Size: Normal)   Pulse 67   Temp 98.2 ?F (36.8 ?C) (Temporal)   Ht '5\' 4"'$  (1.626 m)   Wt 140 lb (63.5 kg)   SpO2 95%   BMI 24.03 kg/m?  ?Wt Readings from Last 3 Encounters:  ?02/22/22 140 lb (63.5 kg)  ?12/22/21 133 lb 6.4 oz (60.5 kg)  ?10/26/21 135 lb (61.2 kg)  ? ?   ?Assessment & Plan:  ? ?Problem List Items Addressed This Visit   ? ?  ? Other  ? Fatigue - Primary  ? Relevant Orders  ? CBC with Differential/Platelet  ? TSH  ? Vitamin D (25 hydroxy)  ? ?Other Visit Diagnoses   ? ? History of hematuria      ? Relevant Orders  ? Urinalysis, Routine w reflex  microscopic  ? ?  ? ? ?Jeanie Sewer, NP ? ?

## 2022-02-22 NOTE — Patient Instructions (Addendum)
It was very nice to see you today! ? ?Go to the lab for blood work today. ?General tips to avoid fatigue:  ?Sleep at least 7 hours nightly. ?Remember to drink at least 2 liters of water daily, shoot for up to 45mn of exercise, and don't go long periods without eating at least a mini-meal or healthy snack. ?Avoid sugary foods including white carbs as much as possible! ?See handout attached for more tips. ? ? ? ?PLEASE NOTE: ? ?If you had any lab tests please let [facility] if you have not heard back within a few days. You may see your results on MyChart before we have a chance to review them but we will give you a call once they are reviewed by [facility] If we ordered any referrals today, please let [facility] if you have not heard from their office within the next week.  ? ?

## 2022-02-26 NOTE — Progress Notes (Signed)
Hi Debra Rhodes, ? ?Your Vitamin D and thyroid levels are in good range.  ?Your urine is clear & not showing any blood. ?Your blood count shows a low WBC & slightly low neutrophil #, both help fight infection. I would recommend rechecking this is in a couple of weeks, but it does look like Rexulti could cause this. How long have you been taking this? ? ?Your Eosinophils are slightly high which is usually indicative of allergens in the body, most likely related to the spring allergy season.

## 2022-03-01 ENCOUNTER — Encounter: Payer: Self-pay | Admitting: Family

## 2022-03-08 ENCOUNTER — Ambulatory Visit (INDEPENDENT_AMBULATORY_CARE_PROVIDER_SITE_OTHER): Payer: BC Managed Care – PPO | Admitting: Psychiatry

## 2022-03-08 DIAGNOSIS — F411 Generalized anxiety disorder: Secondary | ICD-10-CM

## 2022-03-08 NOTE — Progress Notes (Signed)
?    [facility] Counselor/Therapist Progress Note ? ?Patient ID: Debra Rhodes, MRN: [id number],   ? ?Date: 03/08/2022 ? ?Time Spent: 57 minutes  ? ?Treatment Type: Individual Therapy ? ?Reported Symptoms: anxiety, some occasional "blah" days ? ?Mental Status Exam: ? ?Appearance:   Well Groomed     ?Behavior:  Appropriate, Sharing, and Motivated  ?Motor:  Normal  ?Speech/Language:   Clear and Coherent  ?Affect:  anxious  ?Mood:  anxious  ?Thought process:  goal directed  ?Thought content:    Overthinking, obsessive thoughts  ?Sensory/Perceptual disturbances:    WNL  ?Orientation:  oriented to person, place, time/date, situation, day of week, month of year, year, and stated date of March 08, 2022  ?Attention:  Good  ?Concentration:  Good  ?Memory:  WNL  ?Fund of knowledge:   Good  ?Insight:    Good and Fair  ?Judgment:   Good  ?Impulse Control:  Fair  ? ?Risk Assessment: ?Danger to Self:  No ?Self-injurious Behavior: No ?Danger to Others: No ?Duty to Warn:no ?Physical Aggression / Violence:No  ?Access to Firearms a concern: No  ?Gang Involvement:No  ? ?Subjective: In today reporting anxiety heightened with new job started, adding that she is using a lot of deep breathing exercises on the job and it's helping. Working on using more positive self-talk. Missed a couple days calling AA sponsor, but has gotten back on track and remaining alcohol-free. Sometimes distracted at work and is using reminder notes. Shared and worked on better managing some stressors at her new job and interrupt some of her negative tendencies. Overspending/shopping but she is working to stop these behaviors, and sees them as "replacement/soothing behaviors". 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 Has a few good AA contacts in addition to her [place] sponsor and sees this as a rule advantage for her.  States the boss at her new job has given her some positive  feedback which feels affirming for patient.  Obsessive thoughts decreased some.  Had to stop her Rexulti due to having issues with it and will follow up with her med provider.  Still struggling some with it "being hard to do things that do not bring me comfort", which is, some with her AA sponsor and other AA friends.  Still some resentment issues regarding family history/relationships.  Anxiety is not quite as high as it has been.  Encouraged her continued involvement with her church and staying connected to supportive people, following up with AA sponsors suggestions, and consistently practicing things that are good for her and her sobriety. ? ?Interventions: Solution-Oriented/Positive Psychology, Ego-Supportive, and Insight-Oriented ? ?Treatment Plan: ?Patient not signing treatment plan on computer screen due to [place]. ?Treatment Goals: ?Treatment goals remain on treatment plan while patient works with strategies to achieve her goals.  Progress is assessed each session and noted in the "progress" section of treatment note. ?Long term goal: ?Develop healthy cognitive patterns and beliefs about self in the world that lead to alleviation and help prevent the relapse of depressive and anxiety symptoms. ?Short term goal: ?Verbally express understanding the relationship between depressed mood and repression of feelings such as anger hurt and sadness. ?Strategies: ?Replace negative self-defeating self talk with verbalization of realistic and positive cognitive messages that do not feed anxiety nor depression ?  ?Diagnosis: ?  ICD-10-CM   ?1. Generalized anxiety disorder  F41.1   ?  ? ?Plan: Patient today showing good  participation and motivation in session as she focused on maintaining her sobriety and all that contributes towards that including friendships, better ways of managing anxiety and stress, stopping cravings for things other than alcohol through overspending, shopping, church involvement, practicing believing  more in herself, and being able to follow through on doing things that are helpful to her even if they are difficult.  Working to become more persevering in her positive self-care and in maintaining her sobriety.  Understanding that things can feel challenging right now but she has also been doing well the last few weeks, is working a new job, and showing some real strength.  Another plus for patient is that her current state of being sober has rekindled some positive feelings between her 3 adult daughters and patient and that means a whole lot patient. Encouraged patient in her practice of more positive behaviors helping her remain alcohol free including: Not purchasing any alcohol, not accepting alcohol from others, to remain in daily contact with her [place] sponsor, attend daily AA meetings, set limits with others as she needs to including significant other, positive self talk more consistently, practicing good self-care, continuing her exercises even when she cannot be at the gym by using alternative exercises, remaining in the present focusing on what she can control, saying no when she needs to say no, and realizing her positive strength when she works with goal-directed behaviors to move in a direction that supports her improved emotional health, and her sobriety. ? ?Goal review and progress/challenges noted with patient. ? ?Next appointment within 2 to 3 weeks. ? ?This record has been created using [employer].  Chart creation errors have been sought, but may not always have been located and corrected.  Such creation errors do not reflect on the standard of medical care provided. ? ? ?Shanon Ace, LCSW ? ? ? ? ? ? ? ? ? ? ? ? ? ? ? ? ? ? ?

## 2022-03-16 ENCOUNTER — Other Ambulatory Visit: Payer: Self-pay | Admitting: Family

## 2022-03-16 ENCOUNTER — Other Ambulatory Visit (INDEPENDENT_AMBULATORY_CARE_PROVIDER_SITE_OTHER): Payer: BC Managed Care – PPO

## 2022-03-16 ENCOUNTER — Other Ambulatory Visit: Payer: Self-pay

## 2022-03-16 DIAGNOSIS — R7989 Other specified abnormal findings of blood chemistry: Secondary | ICD-10-CM

## 2022-03-16 LAB — CBC WITH DIFFERENTIAL/PLATELET
Basophils Absolute: 0 10*3/uL (ref 0.0–0.1)
Basophils Relative: 1 % (ref 0.0–3.0)
Eosinophils Absolute: 0.1 10*3/uL (ref 0.0–0.7)
Eosinophils Relative: 2.1 % (ref 0.0–5.0)
HCT: 38.6 % (ref 36.0–46.0)
Hemoglobin: 12.9 g/dL (ref 12.0–15.0)
Lymphocytes Relative: 26.2 % (ref 12.0–46.0)
Lymphs Abs: 1.1 10*3/uL (ref 0.7–4.0)
MCHC: 33.5 g/dL (ref 30.0–36.0)
MCV: 93.8 fl (ref 78.0–100.0)
Monocytes Absolute: 0.3 10*3/uL (ref 0.1–1.0)
Monocytes Relative: 7.3 % (ref 3.0–12.0)
Neutro Abs: 2.7 10*3/uL (ref 1.4–7.7)
Neutrophils Relative %: 63.4 % (ref 43.0–77.0)
Platelets: 228 10*3/uL (ref 150.0–400.0)
RBC: 4.12 Mil/uL (ref 3.87–5.11)
RDW: 13.9 % (ref 11.5–15.5)
WBC: 4.2 10*3/uL (ref 4.0–10.5)

## 2022-03-19 NOTE — Progress Notes (Signed)
Hi Debra Rhodes, ? ?Your blood count is back to normal. Did you stop the Rexulti?

## 2022-03-31 ENCOUNTER — Encounter: Payer: Self-pay | Admitting: Family

## 2022-03-31 ENCOUNTER — Ambulatory Visit: Payer: BC Managed Care – PPO | Admitting: Psychiatry

## 2022-03-31 ENCOUNTER — Telehealth (INDEPENDENT_AMBULATORY_CARE_PROVIDER_SITE_OTHER): Payer: BC Managed Care – PPO | Admitting: Family

## 2022-03-31 VITALS — Ht 64.0 in | Wt 140.0 lb

## 2022-03-31 DIAGNOSIS — U071 COVID-19: Secondary | ICD-10-CM

## 2022-03-31 MED ORDER — MOLNUPIRAVIR EUA 200MG CAPSULE: 4.0000 | ORAL_CAPSULE | Freq: Two times a day (BID) | ORAL | 0 refills | Status: AC

## 2022-03-31 NOTE — Progress Notes (Signed)
? ? ?MyChart Video Visit ? ? ? ?Virtual Visit via Video Note  ? ?This visit type was conducted due to national recommendations for restrictions regarding the COVID-19 Pandemic (e.g. social distancing) in an effort to limit this patient's exposure and mitigate transmission in our community. This patient is at least at moderate risk for complications without adequate follow up. This format is felt to be most appropriate for this patient at this time. Physical exam was limited by quality of the video and audio technology used for the visit. CMA was able to get the patient set up on a video visit. ? ?Patient location: Home. Patient and provider in visit ?Provider location: Office ? ?I discussed the limitations of evaluation and management by telemedicine and the availability of in person appointments. The patient expressed understanding and agreed to proceed. ? ?Visit Date: 03/31/2022 ? ?Today's healthcare provider: Jeanie Sewer, NP  ? ? ? ?Subjective:  ? ? Patient ID: Debra Rhodes, female    DOB: Mar 24, 1961, 61 y.o.   MRN: [id number] ? ?Chief Complaint  ?Patient presents with  ? Covid Positive  ?  Pt tested positive today for covid. Symptoms include headache,sore throat and fatigue for a couple of days. Has tried dayquil which did help.   ? ?HPI ?Upper Respiratory Infection: Symptoms include low grade fever, nasal congestion, non productive cough, sore throat, and fatigue & hoarseness .  ?Onset of symptoms was 2 days ago, gradually worsening since that time. She is drinking moderate amounts of fluids. Evaluation to date: none.  ?Treatment to date: cough suppressants and Dayquil. ? ? ?Assessment & Plan:  ? ?Problem List Items Addressed This Visit   ?None ?Visit Diagnoses   ? ? COVID-19    -  Primary  ? Molnupiravir, pt advised of FDA label for emergency use, how to take, & SE. Advised of CDC guidelines for masking. Advised of safe practice guidelines. OK to take OTC cold medicine, Tylenol, or Ibuprofen with this.  Encouraged to monitor & notify office of any worsening symptoms: increased shortness of breath, weakness, and signs of dehydration. Instructed to rest and hydrate well.   ? ? ?Relevant Medications  ? molnupiravir EUA (LAGEVRIO) 200 mg CAPS capsule  ? ?  ? ?Past Medical History:  ?Diagnosis Date  ? Anxiety   ? Depression   ? PONV (postoperative nausea and vomiting)   ? ? ?Past Surgical History:  ?Procedure Laterality Date  ? AUGMENTATION MAMMAPLASTY Bilateral 1996  ? BACK SURGERY    ? CESAREAN SECTION    ? FRACTURE SURGERY    ? 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 Procedure: TOTAL KNEE ARTHROPLASTY;  Surgeon: Paralee Cancel, MD;  Location: [facility];  Service: Orthopedics;  Laterality: Right;  70 mins  ? ? ?Outpatient Medications Prior to Visit  ?Medication Sig Dispense Refill  ? brexpiprazole (REXULTI) 2 MG TABS tablet Take 1 tablet (2 mg total) by mouth daily. 90 tablet 1  ? cetirizine (ZYRTEC) 10 MG tablet Take 10 mg by mouth daily.    ? DULoxetine (CYMBALTA) 30 MG capsule Take 1 capsule (30 mg total) by mouth daily. Take with a 60 mg capsule to equal total daily dose of 90 mg 90 capsule 0  ? DULoxetine (CYMBALTA) 60 MG capsule Take 1 capsule (60 mg total) by mouth daily. Take with a 30 mg capsule to equal total daily dose of 90 mg. 90 capsule 0  ? estradiol-norethindrone (COMBIPATCH) 0.05-0.14 MG/DAY Place 1 patch onto the skin once a week.    ?  Ferrous Gluconate (IRON) 240 (27 Fe) MG TABS 1 tablet with water or juice between meals    ? gabapentin (NEURONTIN) 100 MG capsule Take 100 mg in the morning and 100 mg at noon, then take (2) capsules 200 mg in the evening. 180 capsule 2  ? Methylphenidate HCl ER, PM, (JORNAY PM) 20 MG CP24 Take 20 mg by mouth every evening. 30 capsule 0  ? Multiple Vitamin (MULTIVITAMIN) tablet Take 1 tablet by mouth daily.    ? naltrexone (DEPADE) 50 MG tablet Take 1 tablet (50 mg total) by mouth daily. 90 tablet 1  ? traZODone (DESYREL) 50 MG tablet Take 1-2 tabs at bedtime as  needed for insomnia 180 tablet 0  ? ?No facility-administered medications prior to visit.  ? ? ?Allergies  ?Allergen Reactions  ? Latex Shortness Of Breath, Swelling and Rash  ? ? ?   ?Objective:  ?  ? ?Physical Exam ?Vitals and nursing note reviewed.  ?Constitutional:   ?   General: She is not in acute distress. ?   Appearance: Normal appearance.  ?HENT:  ?   Head: Normocephalic.  ?Pulmonary:  ?   Effort: No respiratory distress.  ?Musculoskeletal:  ?   Cervical back: Normal range of motion.  ?Skin: ?   General: Skin is dry.  ?   Coloration: Skin is not pale.  ?Neurological:  ?   Mental Status: She is alert and oriented to person, place, and time.  ?Psychiatric:     ?   Mood and Affect: Mood normal.  ? ?Ht '5\' 4"'$  (1.626 m)   Wt 140 lb (63.5 kg)   BMI 24.03 kg/m?  ? ?Wt Readings from Last 3 Encounters:  ?03/31/22 140 lb (63.5 kg)  ?02/22/22 140 lb (63.5 kg)  ?12/22/21 133 lb 6.4 oz (60.5 kg)  ? ? ?   ? ?I discussed the assessment and treatment plan with the patient. The patient was provided an opportunity to ask questions and all were answered. The patient agreed with the plan and demonstrated an understanding of the instructions. ?  ?The patient was advised to call back or seek an in-person evaluation if the symptoms worsen or if the condition fails to improve as anticipated. ? ?I provided 21 minutes of face-to-face time during this encounter. ? ?Jeanie Sewer, NP ?[place] ?[phone] (phone) ?[phone] (fax) ? ?[facility]  ?

## 2022-04-04 ENCOUNTER — Encounter: Payer: Self-pay | Admitting: Family Medicine

## 2022-04-04 ENCOUNTER — Ambulatory Visit (INDEPENDENT_AMBULATORY_CARE_PROVIDER_SITE_OTHER): Payer: Self-pay | Admitting: Family Medicine

## 2022-04-04 VITALS — BP 118/70 | HR 74 | Temp 98.2°F | Ht 64.0 in | Wt 136.0 lb

## 2022-04-04 DIAGNOSIS — H6502 Acute serous otitis media, left ear: Secondary | ICD-10-CM

## 2022-04-04 DIAGNOSIS — U071 COVID-19: Secondary | ICD-10-CM

## 2022-04-04 MED ORDER — BENZONATATE 100 MG PO CAPS: 100.0000 mg | ORAL_CAPSULE | Freq: Three times a day (TID) | ORAL | 0 refills | Status: DC | PRN

## 2022-04-04 MED ORDER — AMOXICILLIN-POT CLAVULANATE 875-125 MG PO TABS
1.0000 | ORAL_TABLET | Freq: Two times a day (BID) | ORAL | 0 refills | Status: DC
Start: 2022-04-04 — End: 2022-05-30

## 2022-04-04 NOTE — Progress Notes (Signed)
? ?Subjective:  ? ? ? Patient ID: Debra Rhodes, female    DOB: 1961-02-24, 61 y.o.   MRN: [id number] ? ?Chief Complaint  ?Patient presents with  ? Ear Pain  ?  Left ear pain started yesterday, felt very full  ? Laryngitis  ?  Started last Thursday, tested positive on 03/31/2022 for Covid, tested negative twice today  ? Cough  ?  Coughing up green mucus  ? ? ?HPI ?Above ?Symptoms started 1 wk ago. Hoarseness/congestion.coughing. no myalgias. No fever.  +sore throat.  +HA.   No loss of smell.  A lot of drainage.  Tested + for covid on 5/12. Taking molnupivir ?This am L ear pain. No drainge from ear.  Coughing green.  No fever.  Some chills.  ? ?Health Maintenance Due  ?Topic Date Due  ? Zoster Vaccines- Shingrix (1 of 2) Never done  ? PAP SMEAR-Modifier  02/16/2022  ? ? ?Past Medical History:  ?Diagnosis Date  ? Anxiety   ? Depression   ? PONV (postoperative nausea and vomiting)   ? ? ?Past Surgical History:  ?Procedure Laterality Date  ? AUGMENTATION MAMMAPLASTY Bilateral 1996  ? BACK SURGERY    ? CESAREAN SECTION    ? FRACTURE SURGERY    ? 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 Procedure: TOTAL KNEE ARTHROPLASTY;  Surgeon: Paralee Cancel, MD;  Location: [facility];  Service: Orthopedics;  Laterality: Right;  70 mins  ? ? ?Outpatient Medications Prior to Visit  ?Medication Sig Dispense Refill  ? cetirizine (ZYRTEC) 10 MG tablet Take 10 mg by mouth daily.    ? DULoxetine (CYMBALTA) 30 MG capsule Take 1 capsule (30 mg total) by mouth daily. Take with a 60 mg capsule to equal total daily dose of 90 mg 90 capsule 0  ? DULoxetine (CYMBALTA) 60 MG capsule Take 1 capsule (60 mg total) by mouth daily. Take with a 30 mg capsule to equal total daily dose of 90 mg. 90 capsule 0  ? estradiol-norethindrone (COMBIPATCH) 0.05-0.14 MG/DAY Place 1 patch onto the skin once a week.    ? gabapentin (NEURONTIN) 100 MG capsule Take 100 mg in the morning and 100 mg at noon, then take (2) capsules 200 mg in the evening. 180 capsule 2  ?  molnupiravir EUA (LAGEVRIO) 200 mg CAPS capsule Take 4 capsules (800 mg total) by mouth 2 (two) times daily for 5 days. 40 capsule 0  ? Multiple Vitamin (MULTIVITAMIN) tablet Take 1 tablet by mouth daily.    ? naltrexone (DEPADE) 50 MG tablet Take 1 tablet (50 mg total) by mouth daily. 90 tablet 1  ? traZODone (DESYREL) 50 MG tablet Take 1-2 tabs at bedtime as needed for insomnia 180 tablet 0  ? brexpiprazole (REXULTI) 2 MG TABS tablet Take 1 tablet (2 mg total) by mouth daily. (Patient not taking: Reported on 04/04/2022) 90 tablet 1  ? Ferrous Gluconate (IRON) 240 (27 Fe) MG TABS 1 tablet with water or juice between meals (Patient not taking: Reported on 04/04/2022)    ? Methylphenidate HCl ER, PM, (JORNAY PM) 20 MG CP24 Take 20 mg by mouth every evening. (Patient not taking: Reported on 04/04/2022) 30 capsule 0  ? ?No facility-administered medications prior to visit.  ? ? ?Allergies  ?Allergen Reactions  ? Latex Shortness Of Breath, Swelling and Rash  ? ?ROS neg/noncontributory except as noted HPI/below ? ? ?   ?Objective:  ?  ? ?BP 118/70   Pulse 74   Temp 98.2 ?F (  36.8 ?C) (Temporal)   Ht '5\' 4"'$  (1.626 m)   Wt 136 lb (61.7 kg)   SpO2 98%   BMI 23.34 kg/m?  ?Wt Readings from Last 3 Encounters:  ?04/04/22 136 lb (61.7 kg)  ?03/31/22 140 lb (63.5 kg)  ?02/22/22 140 lb (63.5 kg)  ? ? ?Physical Exam  ? ?Gen: [facility] NAD ?HEENT: NCAT, conjunctiva not injected, sclera nonicteric ?TM sl pink w/good landmarks on R, L-red, bulging, OP moist, no exudates  ?NECK:  supple, no thyromegaly, no nodes, no carotid bruits ?CARDIAC: RRR, S1S2+, no murmur. DP 2+B ?LUNGS: CTAB. No wheezes ?EXT:  no edema ?MSK: no gross abnormalities.  ?NEURO: A&O x3.  CN II-XII intact.  ?PSYCH: normal mood. Good eye contact ? ?   ?Assessment & Plan:  ? ?Problem List Items Addressed This Visit   ?None ?Visit Diagnoses   ? ? Acute serous otitis media of left ear, recurrence not specified    -  Primary  ? Relevant Medications  ? amoxicillin-clavulanate  (AUGMENTIN) 875-125 MG tablet  ? ?  ?L otitis media-augmentin 875 bid ?2.  Covid-finishing meds.  Tessalon perles for cough ? ?Meds ordered this encounter  ?Medications  ? amoxicillin-clavulanate (AUGMENTIN) 875-125 MG tablet  ?  Sig: Take 1 tablet by mouth 2 (two) times daily.  ?  Dispense:  20 tablet  ?  Refill:  0  ? benzonatate (TESSALON PERLES) 100 MG capsule  ?  Sig: Take 1 capsule (100 mg total) by mouth 3 (three) times daily as needed.  ?  Dispense:  20 capsule  ?  Refill:  0  ? ? ?Wellington Hampshire, MD ? ?

## 2022-04-04 NOTE — Patient Instructions (Addendum)
It was very nice to see you today! ? ?L ear infected.  Advil.  Delsym cough syrup ? ? ?PLEASE NOTE: ? ?If you had any lab tests please let us know if you have not heard back within a few days. You may see your results on MyChart before we have a chance to review them but we will give you a call once they are reviewed by [place]. If we ordered any referrals today, please let us know if you have not heard from their office within the next week.  ? ?Please try these tips to maintain a healthy lifestyle: ? ?Eat most of your calories during the day when you are active. Eliminate processed foods including packaged sweets (pies, cakes, cookies), reduce intake of potatoes, white bread, white pasta, and white rice. Look for whole grain options, oat flour or almond flour. ? ?Each meal should contain half fruits/vegetables, one quarter protein, and one quarter carbs (no bigger than a computer mouse). ? ?Cut down on sweet beverages. This includes juice, soda, and sweet tea. Also watch fruit intake, though this is a healthier sweet option, it still contains natural sugar! Limit to 3 servings daily. ? ?Drink at least 1 glass of water with each meal and aim for at least 8 glasses per day ? ?Exercise at least 150 minutes every week.   ?

## 2022-04-20 ENCOUNTER — Other Ambulatory Visit: Payer: Self-pay | Admitting: Family

## 2022-04-20 ENCOUNTER — Telehealth: Payer: Self-pay | Admitting: Family

## 2022-04-20 MED ORDER — FLUCONAZOLE 150 MG PO TABS: ORAL_TABLET | ORAL | 0 refills | Status: DC

## 2022-04-20 NOTE — Telephone Encounter (Signed)
Patient was seen by Dr. Cherlynn Kaiser on 5/16.  Was prescribed an antibiotic.    States she now has a yeast infection.  Would like to know if diflucan can be sent to [facility] at [facility] and [address].  Also states that her ears still feel full. States she has to pop ears to open up.   I have offered patient an appointment.  Patient declined stating she is between insurance companies right now.  States she just wants to know if this is normal with allergies.  Please advise.

## 2022-04-20 NOTE — Telephone Encounter (Signed)
Diflucan sent.  Her ears may start "popping" as the inner ear inflammation and and drainage subsides, and it can take several weeks for the fullness/popping to go away, unfortunately. Decongestants do not help, sorry!

## 2022-04-21 ENCOUNTER — Ambulatory Visit: Payer: BC Managed Care – PPO | Admitting: Psychiatry

## 2022-04-21 NOTE — Telephone Encounter (Signed)
I called and spoke with pt. Pt gave a verbalized understanding.

## 2022-05-30 ENCOUNTER — Ambulatory Visit (INDEPENDENT_AMBULATORY_CARE_PROVIDER_SITE_OTHER): Payer: BC Managed Care – PPO | Admitting: Psychiatry

## 2022-05-30 ENCOUNTER — Encounter: Payer: Self-pay | Admitting: Psychiatry

## 2022-05-30 DIAGNOSIS — F411 Generalized anxiety disorder: Secondary | ICD-10-CM

## 2022-05-30 DIAGNOSIS — G4700 Insomnia, unspecified: Secondary | ICD-10-CM

## 2022-05-30 DIAGNOSIS — F32A Depression, unspecified: Secondary | ICD-10-CM

## 2022-05-30 MED ORDER — DULOXETINE HCL 60 MG PO CPEP: 60.0000 mg | ORAL_CAPSULE | Freq: Every day | ORAL | 1 refills | Status: DC

## 2022-05-30 MED ORDER — TRAZODONE HCL 50 MG PO TABS: ORAL_TABLET | ORAL | 1 refills | Status: DC

## 2022-05-30 MED ORDER — GABAPENTIN 100 MG PO CAPS: ORAL_CAPSULE | ORAL | 1 refills | Status: DC

## 2022-05-30 MED ORDER — DULOXETINE HCL 30 MG PO CPEP: 30.0000 mg | ORAL_CAPSULE | Freq: Every day | ORAL | 1 refills | Status: DC

## 2022-05-30 MED ORDER — NALTREXONE HCL 50 MG PO TABS
50.0000 mg | ORAL_TABLET | Freq: Every day | ORAL | 1 refills | Status: DC
Start: 2022-05-30 — End: 2022-06-28

## 2022-05-30 NOTE — Progress Notes (Signed)
Debra Rhodes [id number] 1961/05/16 61 y.o.  Subjective:   Patient ID:  Debra Rhodes is a 61 y.o. (DOB Dec 07, 1960) female.  Chief Complaint:  Chief Complaint  Patient presents with   Follow-up    Anxiety, depression, and ADHD    HPI Debra Rhodes presents to the office today for follow-up of anxiety, depression, insomnia, and ADHD. She is no longer taking Rexulti after having increased fatigue at 2 mg. She reports that fatigue resolved. She reports that she has about once a week where she feels "a little blah" and this resolves spontaneously. Anxiety has improved. She reports some social anxiety with social events. Had some social anxiety when she initially started her job and this has improved over time with increased familiarity. She reports that she felt on the verge of panic when she was about to move in with her boyfriend. She reports chronic worry. Denies any recent intrusive thoughts. Sleep is adequate overall with Trazodone. She will occasionally take 150 mg if unable to sleep. Appetite has been the same or more. Has also been compulsively shopping. Energy and motivation have been good. She reports concentration has been ok without Jornay PM. Denies anhedonia. Denies SI.   She is enjoying her new job and it is less stressful.   She had a relapse about 3 months ago and has been sober since then. She denies craving alcohol and has had recent cravings for sweets. Working with a sponsor and going to meetings.  Past Psychiatric Medication Trials: Paxil- Has taken it since 61 yo. Has had times where it has been less effective. Had wt gain and sexual side effects at higher doses (40 mg and 60 mg). Prozac- felt more anxious Sertraline Cymbalta- Started on 40 mg and ws increased to 60 mg. Wellbutrin- increased irritability Trazodone-helpful but causes some excessive somnolence, especially at 100 mg dose.  Remeron- Effective, wt gain Hydroxyzine Gabapentin- Takes as needed for anxiety  and leg cramps. Occ word finding errors with more than 300 mg Buspar-Increased agitation and anxiety Concerta- Had increased anxiety Strattera- Effective but feels "wired" [place] Office Visit from 02/17/2022 in [facility] Total Score 1      PHQ2-9    [facility] Visit from 02/22/2022 in [facility] from 10/26/2021 in [facility]  PHQ-2 Total Score 0 4  PHQ-9 Total Score 0 13      Flowsheet Row ED from 10/26/2021 in [facility] ED from 07/07/2021 in [facility] ED from 01/17/2021 in [facility]  C-SSRS RISK CATEGORY High Risk High Risk High Risk        Review of Systems:  Review of Systems  Gastrointestinal: Negative.   Musculoskeletal:  Negative for gait problem.  Neurological:  Negative for tremors.  Psychiatric/Behavioral:         Please refer to HPI    Medications: I have reviewed the patient's current medications.  Current Outpatient Medications  Medication Sig Dispense Refill   cetirizine (ZYRTEC) 10 MG tablet Take 10 mg by mouth daily.     estradiol-norethindrone (COMBIPATCH) 0.05-0.14 MG/DAY Place 1 patch onto the skin once a week.     Multiple Vitamin (MULTIVITAMIN) tablet Take 1 tablet by mouth daily.     DULoxetine (CYMBALTA) 30 MG capsule Take 1 capsule (30 mg total) by mouth daily. Take with a 60 mg capsule to equal total daily  dose of 90 mg 90 capsule 1   DULoxetine (CYMBALTA) 60 MG capsule Take 1 capsule (60 mg total) by mouth daily. Take with a 30 mg capsule to equal total daily dose of 90 mg. 90 capsule 1   gabapentin (NEURONTIN) 100 MG capsule Take 100 mg in the morning and 100 mg at noon, then take (2) capsules 200 mg in the evening. 360 capsule 1   naltrexone (DEPADE) 50 MG tablet Take 1 tablet (50 mg total) by mouth daily. 90 tablet 1    traZODone (DESYREL) 50 MG tablet Take 1-2 tabs at bedtime as needed for insomnia 180 tablet 1   No current facility-administered medications for this visit.    Medication Side Effects: Other: Some grogginess after taking trazodone  Allergies:  Allergies  Allergen Reactions   Latex Shortness Of Breath, Swelling and Rash    Past Medical History:  Diagnosis Date   Anxiety    Depression    PONV (postoperative nausea and vomiting)     Past Medical History, Surgical history, Social history, and Family history were reviewed and updated as appropriate.   Please see review of systems for further details on the patient's review from today.   Objective:   Physical Exam:  There were no vitals taken for this visit.  Physical Exam Constitutional:      General: She is not in acute distress. Musculoskeletal:        General: No deformity.  Neurological:     Mental Status: She is alert and oriented to person, place, and time.     Coordination: Coordination normal.  Psychiatric:        Attention and Perception: Attention and perception normal. She does not perceive auditory or visual hallucinations.        Mood and Affect: Mood normal. Mood is not anxious or depressed. Affect is not labile, blunt, angry or inappropriate.        Speech: Speech normal.        Behavior: Behavior normal.        Thought Content: Thought content normal. Thought content is not paranoid or delusional. Thought content does not include homicidal or suicidal ideation. Thought content does not include homicidal or suicidal plan.        Cognition and Memory: Cognition and memory normal.        Judgment: Judgment normal.     Comments: Insight intact     Lab Review:     Component Value Date/Time   NA 139 10/26/2021 0824   K 4.1 10/26/2021 0824   CL 102 10/26/2021 0824   CO2 26 10/26/2021 0824   GLUCOSE 107 (H) 10/26/2021 0824   BUN 10 10/26/2021 0824   CREATININE 0.67 10/26/2021 0824   CALCIUM 9.8 10/26/2021  0824   PROT 6.8 10/26/2021 0824   ALBUMIN 4.4 10/26/2021 0824   AST 27 10/26/2021 0824   ALT 15 10/26/2021 0824   ALKPHOS 44 10/26/2021 0824   BILITOT 0.6 10/26/2021 0824   GFRNONAA >60 10/26/2021 0824   GFRAA >60 05/14/2019 0317       Component Value Date/Time   WBC 4.2 03/16/2022 1254   RBC 4.12 03/16/2022 1254   HGB 12.9 03/16/2022 1254   HCT 38.6 03/16/2022 1254   PLT 228.0 03/16/2022 1254   MCV 93.8 03/16/2022 1254   MCH 31.0 10/26/2021 0824   MCHC 33.5 03/16/2022 1254   RDW 13.9 03/16/2022 1254   LYMPHSABS 1.1 03/16/2022 1254   MONOABS 0.3 03/16/2022 1254  EOSABS 0.1 03/16/2022 1254   BASOSABS 0.0 03/16/2022 1254    No results found for: "POCLITH", "LITHIUM"   No results found for: "PHENYTOIN", "PHENOBARB", "VALPROATE", "CBMZ"   .res Assessment: Plan:   Pt reports that overall medications are adequately controlling target symptoms without any known adverse effects and she would therefore like to continue medications without changes. Discussed considering trial of NAC 600 mg po BID to determine if this may be helpful for cravings and compulsions to eat sweets. Discussed that re-starting Naltrexone may also help with cravings since she reports possibly "substituting sweets for alcohol" and this may have started after she stopped taking Naltrexone.  Continue Cymbalta 90 mg po qd for depression and anxiety.  Continue Gabapentin 100 mg morning, daytime as needed, and 2 capsules at bedtime.  Continue Trazodone 50 mg 1-2 tabs po QHS prn insomnia.  She reports that she has not seen her therapist recently since she currently does not have insurance and hopes to resume therapy once she acquires health insurance in the future. This information was relayed to Rinaldo Cloud, LCSW.  Pt requests to extend follow-up to 6 months if possible while without health insurance and agrees to call or schedule earlier follow-up if needed.  Pt to follow-up in 6 months or sooner if clinically  indicated.  Patient advised to contact office with any questions, adverse effects, or acute worsening in signs and symptoms.  Debra Rhodes was seen today for follow-up.  Diagnoses and all orders for this visit:  Depression, unspecified depression type -     DULoxetine (CYMBALTA) 30 MG capsule; Take 1 capsule (30 mg total) by mouth daily. Take with a 60 mg capsule to equal total daily dose of 90 mg -     DULoxetine (CYMBALTA) 60 MG capsule; Take 1 capsule (60 mg total) by mouth daily. Take with a 30 mg capsule to equal total daily dose of 90 mg.  Generalized anxiety disorder -     DULoxetine (CYMBALTA) 30 MG capsule; Take 1 capsule (30 mg total) by mouth daily. Take with a 60 mg capsule to equal total daily dose of 90 mg -     DULoxetine (CYMBALTA) 60 MG capsule; Take 1 capsule (60 mg total) by mouth daily. Take with a 30 mg capsule to equal total daily dose of 90 mg. -     gabapentin (NEURONTIN) 100 MG capsule; Take 100 mg in the morning and 100 mg at noon, then take (2) capsules 200 mg in the evening.  Insomnia, unspecified type -     traZODone (DESYREL) 50 MG tablet; Take 1-2 tabs at bedtime as needed for insomnia  Other orders -     naltrexone (DEPADE) 50 MG tablet; Take 1 tablet (50 mg total) by mouth daily.     Please see After Visit Summary for patient specific instructions.  Future Appointments  Date Time Provider [place]  09/28/2022  8:20 AM Jeanie Sewer, NP [facility]  11/30/2022 11:30 AM Thayer Headings, PMHNP [facility] None    No orders of the defined types were placed in this encounter.   -------------------------------

## 2022-06-01 ENCOUNTER — Ambulatory Visit: Payer: BC Managed Care – PPO | Admitting: Psychiatry

## 2022-06-21 ENCOUNTER — Emergency Department (HOSPITAL_COMMUNITY)
Admission: EM | Admit: 2022-06-21 | Discharge: 2022-06-21 | Disposition: A | Discharge status: Home / Self Care | Payer: Self-pay | Attending: Emergency Medicine | Admitting: Emergency Medicine

## 2022-06-21 ENCOUNTER — Other Ambulatory Visit: Payer: Self-pay

## 2022-06-21 DIAGNOSIS — Z20822 Contact with and (suspected) exposure to covid-19: Secondary | ICD-10-CM | POA: Insufficient documentation

## 2022-06-21 DIAGNOSIS — F10920 Alcohol use, unspecified with intoxication, uncomplicated: Secondary | ICD-10-CM

## 2022-06-21 DIAGNOSIS — R45851 Suicidal ideations: Secondary | ICD-10-CM | POA: Insufficient documentation

## 2022-06-21 DIAGNOSIS — F1014 Alcohol abuse with alcohol-induced mood disorder: Secondary | ICD-10-CM | POA: Insufficient documentation

## 2022-06-21 LAB — CBC WITH DIFFERENTIAL/PLATELET
Abs Immature Granulocytes: 0.02 10*3/uL (ref 0.00–0.07)
Basophils Absolute: 0.1 10*3/uL (ref 0.0–0.1)
Basophils Relative: 1 %
Eosinophils Absolute: 0.2 10*3/uL (ref 0.0–0.5)
Eosinophils Relative: 3 %
HCT: 39.5 % (ref 36.0–46.0)
Hemoglobin: 13.5 g/dL (ref 12.0–15.0)
Immature Granulocytes: 0 %
Lymphocytes Relative: 31 %
Lymphs Abs: 2.1 10*3/uL (ref 0.7–4.0)
MCH: 32.1 pg (ref 26.0–34.0)
MCHC: 34.2 g/dL (ref 30.0–36.0)
MCV: 93.8 fL (ref 80.0–100.0)
Monocytes Absolute: 0.3 10*3/uL (ref 0.1–1.0)
Monocytes Relative: 5 %
Neutro Abs: 4.2 10*3/uL (ref 1.7–7.7)
Neutrophils Relative %: 60 %
Platelets: 231 10*3/uL (ref 150–400)
RBC: 4.21 MIL/uL (ref 3.87–5.11)
RDW: 13.1 % (ref 11.5–15.5)
WBC: 6.9 10*3/uL (ref 4.0–10.5)
nRBC: 0 % (ref 0.0–0.2)

## 2022-06-21 LAB — COMPREHENSIVE METABOLIC PANEL
ALT: 18 U/L (ref 0–44)
AST: 31 U/L (ref 15–41)
Albumin: 4 g/dL (ref 3.5–5.0)
Alkaline Phosphatase: 45 U/L (ref 38–126)
Anion gap: 11 (ref 5–15)
BUN: 8 mg/dL (ref 8–23)
CO2: 24 mmol/L (ref 22–32)
Calcium: 8.9 mg/dL (ref 8.9–10.3)
Chloride: 105 mmol/L (ref 98–111)
Creatinine, Ser: 0.57 mg/dL (ref 0.44–1.00)
GFR, Estimated: >60 mL/min (ref >60)
Glucose, Bld: 97 mg/dL (ref 70–99)
Potassium: 4 mmol/L (ref 3.5–5.1)
Sodium: 140 mmol/L (ref 135–145)
Total Bilirubin: 0.5 mg/dL (ref 0.3–1.2)
Total Protein: 7.1 g/dL (ref 6.5–8.1)

## 2022-06-21 LAB — RESP PANEL BY RT-PCR (FLU A&B, COVID) ARPGX2
Influenza A by PCR: NEGATIVE
Influenza B by PCR: NEGATIVE
SARS Coronavirus 2 by RT PCR: NEGATIVE

## 2022-06-21 LAB — RAPID URINE DRUG SCREEN, HOSP PERFORMED
Amphetamines: NOT DETECTED
Barbiturates: NOT DETECTED
Benzodiazepines: NOT DETECTED
Cocaine: NOT DETECTED
Opiates: NOT DETECTED
Tetrahydrocannabinol: NOT DETECTED

## 2022-06-21 LAB — ETHANOL: Alcohol, Ethyl (B): 250 mg/dL — ABNORMAL HIGH (ref <10)

## 2022-06-21 LAB — ACETAMINOPHEN LEVEL: Acetaminophen (Tylenol), Serum: <10 ug/mL — ABNORMAL LOW (ref 10–30)

## 2022-06-21 LAB — SALICYLATE LEVEL: Salicylate Lvl: <7 mg/dL — ABNORMAL LOW (ref 7.0–30.0)

## 2022-06-21 MED ORDER — LORAZEPAM 2 MG/ML IJ SOLN: 0.0000 mg | Freq: Two times a day (BID) | INTRAMUSCULAR | Status: DC

## 2022-06-21 MED ORDER — THIAMINE HCL 100 MG/ML IJ SOLN: 100.0000 mg | Freq: Every day | INTRAMUSCULAR | Status: DC

## 2022-06-21 MED ORDER — LORAZEPAM 1 MG PO TABS: 0.0000 mg | ORAL_TABLET | Freq: Two times a day (BID) | ORAL | Status: DC

## 2022-06-21 MED ORDER — LORAZEPAM 1 MG PO TABS: 0.0000 mg | ORAL_TABLET | Freq: Four times a day (QID) | ORAL | Status: DC

## 2022-06-21 MED ORDER — CHLORDIAZEPOXIDE HCL 25 MG PO CAPS: ORAL_CAPSULE | ORAL | 0 refills | Status: DC

## 2022-06-21 MED ORDER — THIAMINE HCL 100 MG PO TABS: 100.0000 mg | ORAL_TABLET | Freq: Every day | ORAL | Status: DC

## 2022-06-21 MED ORDER — LORAZEPAM 2 MG/ML IJ SOLN: 0.0000 mg | Freq: Four times a day (QID) | INTRAMUSCULAR | Status: DC

## 2022-06-21 NOTE — ED Provider Notes (Signed)
[facility] DEPT Provider Note   CSN: [id number] Arrival date & time: 06/21/22  2048     History  Chief Complaint  Patient presents with   Suicidal    Debra Rhodes is a 61 y.o. female hx of alcohol abuse, here with suicidal ideation.  Patient has history of anxiety and also patient has been drinking more alcohol.  Patient states that recently she relapsed and now is drinking 3 bottles of wine a day.  She apparently told her friends that she was planning on killing herself by drinking alcohol.  Her friends called police and she was brought here.  She states that she is just very depressed but she actually does not plan to kill herself now.  She states that she has been to alcohol rehab previously and interested in quitting alcohol.  She denies any history of alcohol withdrawal seizures.  The history is provided by the patient.       Home Medications Prior to Admission medications   Medication Sig Start Date End Date Taking? Authorizing Provider  cetirizine (ZYRTEC) 10 MG tablet Take 10 mg by mouth daily.    [provider]  DULoxetine (CYMBALTA) 30 MG capsule Take 1 capsule (30 mg total) by mouth daily. Take with a 60 mg capsule to equal total daily dose of 90 mg 05/30/22 08/28/22  Thayer Headings, PMHNP  DULoxetine (CYMBALTA) 60 MG capsule Take 1 capsule (60 mg total) by mouth daily. Take with a 30 mg capsule to equal total daily dose of 90 mg. 05/30/22   Thayer Headings, PMHNP  estradiol-norethindrone [facility]) 0.05-0.14 MG/DAY Place 1 patch onto the skin once a week.    [provider]  gabapentin (NEURONTIN) 100 MG capsule Take 100 mg in the morning and 100 mg at noon, then take (2) capsules 200 mg in the evening. 05/30/22   Thayer Headings, PMHNP  Multiple Vitamin (MULTIVITAMIN) tablet Take 1 tablet by mouth daily.    [provider]  naltrexone (DEPADE) 50 MG tablet Take 1 tablet (50 mg total) by mouth daily. 05/30/22    Thayer Headings, PMHNP  traZODone (DESYREL) 50 MG tablet Take 1-2 tabs at bedtime as needed for insomnia 05/30/22   Thayer Headings, PMHNP  sertraline (ZOLOFT) 100 MG tablet Decrease to 1.5 tabs daily for 5 days, then 1 tab daily for 5 days, then 1/2 tablet daily for 5 days, then stop 11/23/20 12/23/20  Thayer Headings, PMHNP      Allergies    Latex    Review of Systems   Review of Systems  Psychiatric/Behavioral:  Positive for dysphoric mood.   All other systems reviewed and are negative.   Physical Exam Updated Vital Signs BP 126/86   Pulse 90   Temp 98.1 F (36.7 C) (Oral)   Resp 17   Ht 5' 3.5" (1.613 m)   Wt 62.6 kg   SpO2 96%   BMI 24.06 kg/m  Physical Exam Vitals and nursing note reviewed.  Constitutional:      Comments: Slightly Intoxicated   HENT:     Head: Normocephalic.     Nose: Nose normal.     Mouth/Throat:     Mouth: Mucous membranes are dry.  Eyes:     Extraocular Movements: Extraocular movements intact.     Pupils: Pupils are equal, round, and reactive to light.  Cardiovascular:     Rate and Rhythm: Normal rate and regular rhythm.     Pulses: Normal pulses.  Heart sounds: Normal heart sounds.  Pulmonary:     Effort: Pulmonary effort is normal.     Breath sounds: Normal breath sounds.  Abdominal:     General: Abdomen is flat.     Palpations: Abdomen is soft.  Musculoskeletal:        General: Normal range of motion.     Cervical back: Normal range of motion and neck supple.  Skin:    General: Skin is warm.     Capillary Refill: Capillary refill takes less than 2 seconds.  Neurological:     General: No focal deficit present.     Mental Status: She is oriented to person, place, and time.  Psychiatric:     Comments: Depressed, not suicidal      ED Results / Procedures / Treatments   Labs (all labs ordered are listed, but only abnormal results are displayed) Labs Reviewed  ETHANOL - Abnormal; Notable for the following components:      Result  Value   Alcohol, Ethyl (B) 250 (*)    All other components within normal limits  SALICYLATE LEVEL - Abnormal; Notable for the following components:   Salicylate Lvl <3.2 (*)    All other components within normal limits  ACETAMINOPHEN LEVEL - Abnormal; Notable for the following components:   Acetaminophen (Tylenol), Serum <10 (*)    All other components within normal limits  RESP PANEL BY RT-PCR (FLU A&B, COVID) ARPGX2  COMPREHENSIVE METABOLIC PANEL  RAPID URINE DRUG SCREEN, HOSP PERFORMED  CBC WITH DIFFERENTIAL/PLATELET    EKG None  Radiology No results found.  Procedures Procedures    Medications Ordered in ED Medications  LORazepam (ATIVAN) injection 0-4 mg (has no administration in time range)    Or  LORazepam (ATIVAN) tablet 0-4 mg (has no administration in time range)  LORazepam (ATIVAN) injection 0-4 mg (has no administration in time range)    Or  LORazepam (ATIVAN) tablet 0-4 mg (has no administration in time range)  thiamine (VITAMIN B1) tablet 100 mg (has no administration in time range)    Or  thiamine (VITAMIN B1) injection 100 mg (has no administration in time range)    ED Course/ Medical Decision Making/ A&P                           Medical Decision Making Debra Rhodes is a 61 y.o. female here with suicidal ideation, alcohol intoxication. Patient is a chronic alcoholic and recently relapsed. She appears slightly intoxicated. She had no specific plan to kill herself other than drinking alcohol. I had extensive discussion with her.  Alcohol level is 250 but she is able to ambulate by herself and eat and drink and has no vomiting.  Her LFTs are normal and her UDS is negative.  I told her that I would prefer that she stays to be evaluated by behavioral health.  However she felt that she wants to just go home and she does not want to stay overnight.  She also does not plan to kill herself.  At this point, I think she is sober enough to make her own decision.  She  wants to go home and asked me to prescribe some Librium so she can quit smoking.  We will also give resources for rehab.   Problems Addressed: Alcohol abuse with alcohol-induced mood disorder [facility]): acute illness or injury Alcoholic intoxication without complication [facility]): acute illness or injury  Amount and/or Complexity of Data Reviewed  Labs: ordered. Decision-making details documented in ED Course.    Final Clinical Impression(s) / ED Diagnoses Final diagnoses:  None    Rx / DC Orders ED Discharge Orders     None         Drenda Freeze, MD 06/21/22 2210

## 2022-06-21 NOTE — ED Notes (Signed)
[employer] called, will transport pt home

## 2022-06-21 NOTE — ED Triage Notes (Signed)
Pt arrives with PD for SI. Per pt, she did have a plan to take pills to kill herself. Pt hx of SI and alcoholism. Per pt, she has been drinking 3 bottles of wine a day for the last week.

## 2022-06-21 NOTE — Discharge Instructions (Signed)
Please quit alcohol. You can take librium to help you with withdrawal symptoms  See resource guide regarding alcohol detox   See behavioral health if you are depressed   Return to ER if you have worse depression, thoughts of harming yourself or others, hallucinations, alcohol withdrawal

## 2022-06-23 ENCOUNTER — Emergency Department (HOSPITAL_COMMUNITY): Payer: BC Managed Care – PPO

## 2022-06-23 ENCOUNTER — Inpatient Hospital Stay (HOSPITAL_COMMUNITY)
Admission: EM | Admit: 2022-06-23 | Discharge: 2022-06-28 | DRG: 917 | Disposition: A | Discharge status: Other Acute Inpatient Hospital | Payer: Self-pay | Attending: Internal Medicine | Admitting: Internal Medicine

## 2022-06-23 DIAGNOSIS — Z597 Insufficient social insurance and welfare support: Secondary | ICD-10-CM

## 2022-06-23 DIAGNOSIS — F419 Anxiety disorder, unspecified: Secondary | ICD-10-CM | POA: Diagnosis present

## 2022-06-23 DIAGNOSIS — J9600 Acute respiratory failure, unspecified whether with hypoxia or hypercapnia: Secondary | ICD-10-CM

## 2022-06-23 DIAGNOSIS — Z79899 Other long term (current) drug therapy: Secondary | ICD-10-CM

## 2022-06-23 DIAGNOSIS — D509 Iron deficiency anemia, unspecified: Secondary | ICD-10-CM | POA: Diagnosis present

## 2022-06-23 DIAGNOSIS — Y92009 Unspecified place in unspecified non-institutional (private) residence as the place of occurrence of the external cause: Secondary | ICD-10-CM

## 2022-06-23 DIAGNOSIS — G9340 Encephalopathy, unspecified: Secondary | ICD-10-CM | POA: Insufficient documentation

## 2022-06-23 DIAGNOSIS — Y908 Blood alcohol level of 240 mg/100 ml or more: Secondary | ICD-10-CM | POA: Diagnosis present

## 2022-06-23 DIAGNOSIS — Z79818 Long term (current) use of other agents affecting estrogen receptors and estrogen levels: Secondary | ICD-10-CM

## 2022-06-23 DIAGNOSIS — E876 Hypokalemia: Secondary | ICD-10-CM | POA: Diagnosis present

## 2022-06-23 DIAGNOSIS — E782 Mixed hyperlipidemia: Secondary | ICD-10-CM | POA: Diagnosis present

## 2022-06-23 DIAGNOSIS — Z811 Family history of alcohol abuse and dependence: Secondary | ICD-10-CM

## 2022-06-23 DIAGNOSIS — F10229 Alcohol dependence with intoxication, unspecified: Secondary | ICD-10-CM | POA: Diagnosis present

## 2022-06-23 DIAGNOSIS — R21 Rash and other nonspecific skin eruption: Secondary | ICD-10-CM | POA: Diagnosis present

## 2022-06-23 DIAGNOSIS — D6959 Other secondary thrombocytopenia: Secondary | ICD-10-CM | POA: Diagnosis present

## 2022-06-23 DIAGNOSIS — T43212A Poisoning by selective serotonin and norepinephrine reuptake inhibitors, intentional self-harm, initial encounter: Principal | ICD-10-CM | POA: Diagnosis present

## 2022-06-23 DIAGNOSIS — L299 Pruritus, unspecified: Secondary | ICD-10-CM | POA: Diagnosis present

## 2022-06-23 DIAGNOSIS — T424X2A Poisoning by benzodiazepines, intentional self-harm, initial encounter: Secondary | ICD-10-CM | POA: Diagnosis present

## 2022-06-23 DIAGNOSIS — J690 Pneumonitis due to inhalation of food and vomit: Secondary | ICD-10-CM | POA: Diagnosis present

## 2022-06-23 DIAGNOSIS — T50901A Poisoning by unspecified drugs, medicaments and biological substances, accidental (unintentional), initial encounter: Secondary | ICD-10-CM | POA: Diagnosis present

## 2022-06-23 DIAGNOSIS — Z818 Family history of other mental and behavioral disorders: Secondary | ICD-10-CM

## 2022-06-23 DIAGNOSIS — Z20822 Contact with and (suspected) exposure to covid-19: Secondary | ICD-10-CM | POA: Diagnosis present

## 2022-06-23 DIAGNOSIS — Z96651 Presence of right artificial knee joint: Secondary | ICD-10-CM | POA: Diagnosis present

## 2022-06-23 DIAGNOSIS — G4700 Insomnia, unspecified: Secondary | ICD-10-CM | POA: Diagnosis present

## 2022-06-23 DIAGNOSIS — Z9151 Personal history of suicidal behavior: Secondary | ICD-10-CM

## 2022-06-23 DIAGNOSIS — F32A Depression, unspecified: Secondary | ICD-10-CM | POA: Diagnosis present

## 2022-06-23 DIAGNOSIS — R339 Retention of urine, unspecified: Secondary | ICD-10-CM | POA: Diagnosis present

## 2022-06-23 DIAGNOSIS — J9601 Acute respiratory failure with hypoxia: Secondary | ICD-10-CM | POA: Diagnosis present

## 2022-06-23 DIAGNOSIS — T50902A Poisoning by unspecified drugs, medicaments and biological substances, intentional self-harm, initial encounter: Principal | ICD-10-CM

## 2022-06-23 DIAGNOSIS — Z91040 Latex allergy status: Secondary | ICD-10-CM

## 2022-06-23 LAB — I-STAT CHEM 8, ED
BUN: 8 mg/dL (ref 8–23)
Calcium, Ion: 1.01 mmol/L — ABNORMAL LOW (ref 1.15–1.40)
Chloride: 103 mmol/L (ref 98–111)
Creatinine, Ser: 0.9 mg/dL (ref 0.44–1.00)
Glucose, Bld: 101 mg/dL — ABNORMAL HIGH (ref 70–99)
HCT: 36 % (ref 36.0–46.0)
Hemoglobin: 12.2 g/dL (ref 12.0–15.0)
Potassium: 3.3 mmol/L — ABNORMAL LOW (ref 3.5–5.1)
Sodium: 138 mmol/L (ref 135–145)
TCO2: 23 mmol/L (ref 22–32)

## 2022-06-23 LAB — I-STAT ARTERIAL BLOOD GAS, ED
Acid-Base Excess: 2 mmol/L (ref 0.0–2.0)
Bicarbonate: 27.3 mmol/L (ref 20.0–28.0)
Calcium, Ion: 1.1 mmol/L — ABNORMAL LOW (ref 1.15–1.40)
HCT: 37 % (ref 36.0–46.0)
Hemoglobin: 12.6 g/dL (ref 12.0–15.0)
O2 Saturation: 100 %
Patient temperature: 98.1
Potassium: 3.7 mmol/L (ref 3.5–5.1)
Sodium: 136 mmol/L (ref 135–145)
TCO2: 29 mmol/L (ref 22–32)
pCO2 arterial: 42.4 mmHg (ref 32–48)
pH, Arterial: 7.416 (ref 7.35–7.45)
pO2, Arterial: 167 mmHg — ABNORMAL HIGH (ref 83–108)

## 2022-06-23 LAB — RAPID URINE DRUG SCREEN, HOSP PERFORMED
Amphetamines: NOT DETECTED
Barbiturates: NOT DETECTED
Benzodiazepines: POSITIVE — AB
Cocaine: NOT DETECTED
Opiates: NOT DETECTED
Tetrahydrocannabinol: NOT DETECTED

## 2022-06-23 LAB — CBC
HCT: 36.8 % (ref 36.0–46.0)
Hemoglobin: 12.4 g/dL (ref 12.0–15.0)
MCH: 32.4 pg (ref 26.0–34.0)
MCHC: 33.7 g/dL (ref 30.0–36.0)
MCV: 96.1 fL (ref 80.0–100.0)
Platelets: 178 10*3/uL (ref 150–400)
RBC: 3.83 MIL/uL — ABNORMAL LOW (ref 3.87–5.11)
RDW: 12.5 % (ref 11.5–15.5)
WBC: 5 10*3/uL (ref 4.0–10.5)
nRBC: 0 % (ref 0.0–0.2)

## 2022-06-23 LAB — BASIC METABOLIC PANEL
Anion gap: 11 (ref 5–15)
Anion gap: 10 (ref 5–15)
BUN: 7 mg/dL — ABNORMAL LOW (ref 8–23)
BUN: 7 mg/dL — ABNORMAL LOW (ref 8–23)
CO2: 23 mmol/L (ref 22–32)
CO2: 23 mmol/L (ref 22–32)
Calcium: 8.3 mg/dL — ABNORMAL LOW (ref 8.9–10.3)
Calcium: 8.5 mg/dL — ABNORMAL LOW (ref 8.9–10.3)
Chloride: 104 mmol/L (ref 98–111)
Chloride: 106 mmol/L (ref 98–111)
Creatinine, Ser: 0.71 mg/dL (ref 0.44–1.00)
Creatinine, Ser: 0.65 mg/dL (ref 0.44–1.00)
GFR, Estimated: >60 mL/min (ref >60)
GFR, Estimated: >60 mL/min (ref >60)
Glucose, Bld: 102 mg/dL — ABNORMAL HIGH (ref 70–99)
Glucose, Bld: 96 mg/dL (ref 70–99)
Potassium: 3.4 mmol/L — ABNORMAL LOW (ref 3.5–5.1)
Potassium: 3.6 mmol/L (ref 3.5–5.1)
Sodium: 138 mmol/L (ref 135–145)
Sodium: 139 mmol/L (ref 135–145)

## 2022-06-23 LAB — MRSA NEXT GEN BY PCR, NASAL: MRSA by PCR Next Gen: NOT DETECTED

## 2022-06-23 LAB — URINALYSIS, ROUTINE W REFLEX MICROSCOPIC
Bilirubin Urine: NEGATIVE
Glucose, UA: NEGATIVE mg/dL
Ketones, ur: NEGATIVE mg/dL
Leukocytes,Ua: NEGATIVE
Nitrite: POSITIVE — AB
Protein, ur: NEGATIVE mg/dL
Specific Gravity, Urine: 1.006 (ref 1.005–1.030)
pH: 7 (ref 5.0–8.0)

## 2022-06-23 LAB — CBG MONITORING, ED: Glucose-Capillary: 102 mg/dL — ABNORMAL HIGH (ref 70–99)

## 2022-06-23 LAB — ACETAMINOPHEN LEVEL: Acetaminophen (Tylenol), Serum: <10 ug/mL — ABNORMAL LOW (ref 10–30)

## 2022-06-23 LAB — HEPATIC FUNCTION PANEL
ALT: 21 U/L (ref 0–44)
AST: 36 U/L (ref 15–41)
Albumin: 4.1 g/dL (ref 3.5–5.0)
Alkaline Phosphatase: 52 U/L (ref 38–126)
Bilirubin, Direct: 0.2 mg/dL (ref 0.0–0.2)
Indirect Bilirubin: 0.6 mg/dL (ref 0.3–0.9)
Total Bilirubin: 0.8 mg/dL (ref 0.3–1.2)
Total Protein: 7.6 g/dL (ref 6.5–8.1)

## 2022-06-23 LAB — MAGNESIUM: Magnesium: 2.1 mg/dL (ref 1.7–2.4)

## 2022-06-23 LAB — SALICYLATE LEVEL: Salicylate Lvl: <7 mg/dL — ABNORMAL LOW (ref 7.0–30.0)

## 2022-06-23 LAB — GLUCOSE, CAPILLARY
Glucose-Capillary: 78 mg/dL (ref 70–99)
Glucose-Capillary: 77 mg/dL (ref 70–99)

## 2022-06-23 LAB — ETHANOL: Alcohol, Ethyl (B): 233 mg/dL — ABNORMAL HIGH (ref <10)

## 2022-06-23 LAB — AMMONIA: Ammonia: 28 umol/L (ref 9–35)

## 2022-06-23 LAB — HIV ANTIBODY (ROUTINE TESTING W REFLEX): HIV Screen 4th Generation wRfx: NONREACTIVE

## 2022-06-23 MED ORDER — DOCUSATE SODIUM 50 MG/5ML PO LIQD: 100.0000 mg | Freq: Two times a day (BID) | ORAL | Status: DC | PRN

## 2022-06-23 MED ORDER — PANTOPRAZOLE SODIUM 40 MG IV SOLR
40.0000 mg | Freq: Every day | INTRAVENOUS | Status: DC
  Administered 2022-06-23 – 2022-06-24 (×2): 40 mg via INTRAVENOUS
  Filled 2022-06-23 (×2): qty 10

## 2022-06-23 MED ORDER — THIAMINE HCL 100 MG/ML IJ SOLN
100.0000 mg | Freq: Once | INTRAMUSCULAR | Status: AC
  Administered 2022-06-23: 100 mg via INTRAVENOUS
  Filled 2022-06-23: qty 2

## 2022-06-23 MED ORDER — FENTANYL CITRATE PF 50 MCG/ML IJ SOSY
PREFILLED_SYRINGE | INTRAMUSCULAR | Status: AC
  Administered 2022-06-23: 50 ug
  Filled 2022-06-23: qty 1

## 2022-06-23 MED ORDER — LACTATED RINGERS IV BOLUS
1000.0000 mL | Freq: Once | INTRAVENOUS | Status: AC
  Administered 2022-06-23: 1000 mL via INTRAVENOUS

## 2022-06-23 MED ORDER — PANTOPRAZOLE 2 MG/ML SUSPENSION
40.0000 mg | Freq: Every day | ORAL | Status: DC
  Filled 2022-06-23: qty 20

## 2022-06-23 MED ORDER — POTASSIUM CHLORIDE 20 MEQ PO PACK
40.0000 meq | PACK | Freq: Once | ORAL | Status: AC
  Administered 2022-06-23: 40 meq
  Filled 2022-06-23: qty 2

## 2022-06-23 MED ORDER — DEXTROSE IN LACTATED RINGERS 5 % IV SOLN: INTRAVENOUS | Status: AC

## 2022-06-23 MED ORDER — FENTANYL 2500MCG IN NS 250ML (10MCG/ML) PREMIX INFUSION
0.0000 ug/h | INTRAVENOUS | Status: DC
  Administered 2022-06-23: 25 ug/h via INTRAVENOUS
  Administered 2022-06-24: 225 ug/h via INTRAVENOUS
  Filled 2022-06-23 (×2): qty 250

## 2022-06-23 MED ORDER — PROPOFOL 1000 MG/100ML IV EMUL: 0.0000 ug/kg/min | INTRAVENOUS | Status: DC

## 2022-06-23 MED ORDER — FOLIC ACID 1 MG PO TABS
1.0000 mg | ORAL_TABLET | Freq: Every day | ORAL | Status: DC
  Administered 2022-06-23 – 2022-06-24 (×2): 1 mg
  Filled 2022-06-23 (×2): qty 1

## 2022-06-23 MED ORDER — THIAMINE HCL 100 MG/ML IJ SOLN
100.0000 mg | Freq: Every day | INTRAMUSCULAR | Status: DC
  Administered 2022-06-24: 100 mg via INTRAVENOUS
  Filled 2022-06-23: qty 2

## 2022-06-23 MED ORDER — LACTATED RINGERS IV SOLN
INTRAVENOUS | Status: DC
Start: 2022-06-23 — End: 2022-06-23

## 2022-06-23 MED ORDER — DOCUSATE SODIUM 100 MG PO CAPS: 100.0000 mg | ORAL_CAPSULE | Freq: Two times a day (BID) | ORAL | Status: DC | PRN

## 2022-06-23 MED ORDER — SODIUM CHLORIDE 0.9 % IV SOLN
3.0000 g | Freq: Four times a day (QID) | INTRAVENOUS | Status: DC
  Administered 2022-06-23 – 2022-06-27 (×15): 3 g via INTRAVENOUS
  Filled 2022-06-23 (×15): qty 8

## 2022-06-23 MED ORDER — NALOXONE HCL 0.4 MG/ML IJ SOLN
0.4000 mg | Freq: Once | INTRAMUSCULAR | Status: AC
  Administered 2022-06-23: 0.4 mg via INTRAVENOUS

## 2022-06-23 MED ORDER — HEPARIN SODIUM (PORCINE) 5000 UNIT/ML IJ SOLN
5000.0000 [IU] | Freq: Three times a day (TID) | INTRAMUSCULAR | Status: DC
  Administered 2022-06-23 – 2022-06-28 (×14): 5000 [IU] via SUBCUTANEOUS
  Filled 2022-06-23 (×14): qty 1

## 2022-06-23 MED ORDER — PROPOFOL 1000 MG/100ML IV EMUL
INTRAVENOUS | Status: AC
  Filled 2022-06-23: qty 100

## 2022-06-23 MED ORDER — POLYETHYLENE GLYCOL 3350 17 G PO PACK: 17.0000 g | PACK | Freq: Every day | ORAL | Status: DC | PRN

## 2022-06-23 MED ORDER — CALCIUM GLUCONATE-NACL 1-0.675 GM/50ML-% IV SOLN
1.0000 g | Freq: Once | INTRAVENOUS | Status: AC
  Administered 2022-06-23: 1000 mg via INTRAVENOUS
  Filled 2022-06-23: qty 50

## 2022-06-23 MED ORDER — SUCCINYLCHOLINE CHLORIDE 200 MG/10ML IV SOSY
PREFILLED_SYRINGE | INTRAVENOUS | Status: AC
  Administered 2022-06-23: 100 mg via INTRAVENOUS
  Filled 2022-06-23: qty 10

## 2022-06-23 MED ORDER — LACTATED RINGERS IV BOLUS
500.0000 mL | Freq: Once | INTRAVENOUS | Status: AC
  Administered 2022-06-23: 500 mL via INTRAVENOUS

## 2022-06-23 MED ORDER — ETOMIDATE 2 MG/ML IV SOLN
INTRAVENOUS | Status: AC
  Administered 2022-06-23: 20 mg
  Filled 2022-06-23: qty 10

## 2022-06-23 NOTE — Progress Notes (Signed)
   06/23/22 2000  Clinical Encounter Type  Visited With Patient and family together;Other (Comment)  Visit Type Social support  Referral From Family  Consult/Referral To Chaplain   Chaplain observed a family in the consultation room in the ED.  Chaplain offered assistance to the family and asked if they required anything.  Chaplain sought permission from nurse (as the request of family) to see the pt and then escorted two individuals (the ex-husband and a friend) to briefly speak with the pt.  Chaplain prayed with pt and two individuals.  Afterward, the chaplain helped the family relocate to [facility], where the pt would be transferred.  Hubert Azure, Chaplain Pager: [phone]

## 2022-06-23 NOTE — ED Triage Notes (Signed)
Pt bib [facility] from home where she was found by a friend and her neighbors on the floor of the bathroom with multiple pills and an empty trazodone bottle next to her. Pt is a known alcoholic and has been telling her friends that she has been wanting to kill herself. Pt was found unresponsive and was given '4mg'$  of narcan en route. Pt arrives in [place]collar and NRB on, only responding to pain GCS 7 on arrival

## 2022-06-23 NOTE — ED Provider Notes (Signed)
[facility] Provider Note   CSN: [id number] Arrival date & time: 06/23/22  1507     History  Chief Complaint  Patient presents with   Drug Overdose    [facility] is a 61 y.o. female.  With PMH of alcohol use disorder, depression, recently here for SI discharged home with Librium presents as concern for trazodone overdose.  Found unresponsive by EMS, was being bagged by EMS.  No other abnormal vital signs.  Found to have an empty trazodone bottle nearby.  Spoke with friend who is a a sponsor and had checked on patient and found her unresponsive that she took at least 50 50 mg trazodone pills with Ativan, Librium and alcohol.  She had been calling her sponsor and ex-boyfriend saying she want to kill herself and she was over it.  HPI     Home Medications Prior to Admission medications   Medication Sig Start Date End Date Taking? Authorizing Provider  cetirizine (ZYRTEC) 10 MG tablet Take 10 mg by mouth daily.    [provider]  chlordiazePOXIDE (LIBRIUM) 25 MG capsule '50mg'$  PO TID x 1D, then 25-'50mg'$  PO BID X 1D, then 25-'50mg'$  PO QD X 1D 06/21/22   Drenda Freeze, MD  DULoxetine (CYMBALTA) 30 MG capsule Take 1 capsule (30 mg total) by mouth daily. Take with a 60 mg capsule to equal total daily dose of 90 mg 05/30/22 08/28/22  Thayer Headings, PMHNP  DULoxetine (CYMBALTA) 60 MG capsule Take 1 capsule (60 mg total) by mouth daily. Take with a 30 mg capsule to equal total daily dose of 90 mg. 05/30/22   Thayer Headings, PMHNP  estradiol-norethindrone [facility]) 0.05-0.14 MG/DAY Place 1 patch onto the skin once a week.    [provider]  gabapentin (NEURONTIN) 100 MG capsule Take 100 mg in the morning and 100 mg at noon, then take (2) capsules 200 mg in the evening. 05/30/22   Thayer Headings, PMHNP  Multiple Vitamin (MULTIVITAMIN) tablet Take 1 tablet by mouth daily.    [provider]  naltrexone (DEPADE) 50 MG tablet Take 1  tablet (50 mg total) by mouth daily. 05/30/22   Thayer Headings, PMHNP  traZODone (DESYREL) 50 MG tablet Take 1-2 tabs at bedtime as needed for insomnia 05/30/22   Thayer Headings, PMHNP  sertraline (ZOLOFT) 100 MG tablet Decrease to 1.5 tabs daily for 5 days, then 1 tab daily for 5 days, then 1/2 tablet daily for 5 days, then stop 11/23/20 12/23/20  Thayer Headings, PMHNP      Allergies    Latex    Review of Systems   Review of Systems  Physical Exam Updated Vital Signs BP 96/67   Pulse 62   Temp 97.6 F (36.4 C)   Resp 16   SpO2 100%  Physical Exam Constitutional: female laying in bed with nasal trumpet in place, GCS 7 Eyes: Conjunctivae are normal. [id number] ENT      Head: Normocephalic and atraumatic.      Nose: No congestion.      Mouth/Throat: Mucous membranes are moist.      Neck: No stridor. C spine collar present Cardiovascular: S1, S2,  Normal and symmetric distal pulses are present in all extremities.Warm and well perfused. Respiratory: Bradypnea, respiratory rate of 5, crackles on right lung, satting 100% on nonrebreather, nasal trumpet in place Gastrointestinal: Soft and nontender.  No evidence of traumatic injury  musculoskeletal: No evidence of trauma, localizing to pain Neurologic: Moaning, localizing  to pain, will not open eyes to painful stimulus Skin: Skin is warm, dry and intact. No rash noted. Psychiatric: Altered, unresponsive   ED Results / Procedures / Treatments   Labs (all labs ordered are listed, but only abnormal results are displayed) Labs Reviewed  CBC - Abnormal; Notable for the following components:      Result Value   RBC 3.83 (*)    All other components within normal limits  BASIC METABOLIC PANEL - Abnormal; Notable for the following components:   Potassium 3.4 (*)    Glucose, Bld 102 (*)    BUN 7 (*)    Calcium 8.3 (*)    All other components within normal limits  CBG MONITORING, ED - Abnormal; Notable for the following components:    Glucose-Capillary 102 (*)    All other components within normal limits  I-STAT CHEM 8, ED - Abnormal; Notable for the following components:   Potassium 3.3 (*)    Glucose, Bld 101 (*)    Calcium, Ion 1.01 (*)    All other components within normal limits  I-STAT ARTERIAL BLOOD GAS, ED - Abnormal; Notable for the following components:   pO2, Arterial 167 (*)    Calcium, Ion 1.10 (*)    All other components within normal limits  CULTURE, RESPIRATORY W GRAM STAIN  MAGNESIUM  ACETAMINOPHEN LEVEL  SALICYLATE LEVEL  HEPATIC FUNCTION PANEL  ETHANOL  RAPID URINE DRUG SCREEN, HOSP PERFORMED  URINALYSIS, ROUTINE W REFLEX MICROSCOPIC  AMMONIA  BASIC METABOLIC PANEL  BLOOD GAS, ARTERIAL  HIV ANTIBODY (ROUTINE TESTING W REFLEX)  I-STAT CHEM 8, ED    EKG EKG Interpretation  Date/Time:  Friday June 23 2022 15:21:52 EDT Ventricular Rate:  76 PR Interval:  153 QRS Duration: 94 QT Interval:  428 QTC Calculation: 482 R Axis:   73 Text Interpretation: Sinus rhythm Qtc 482 slightly prolonged Confirmed by Georgina Snell ([id number]) on 06/23/2022 4:17:23 PM  Radiology CT Head Wo Contrast  Result Date: 06/23/2022 CLINICAL DATA:  Altered mental status, found down EXAM: CT HEAD WITHOUT CONTRAST CT CERVICAL SPINE WITHOUT CONTRAST TECHNIQUE: Multidetector CT imaging of the head and cervical spine was performed following the standard protocol without intravenous contrast. Multiplanar CT image reconstructions of the cervical spine were also generated. RADIATION DOSE REDUCTION: This exam was performed according to the departmental dose-optimization program which includes automated exposure control, adjustment of the mA and/or kV according to patient size and/or use of iterative reconstruction technique. COMPARISON:  None Available. FINDINGS: CT HEAD FINDINGS Brain: No intracranial hemorrhage, mass effect, or evidence of acute infarct. No hydrocephalus. No extra-axial fluid collection. Generalized cerebral  atrophy. Ill-defined hypoattenuation within the cerebral white matter is nonspecific but consistent with chronic small vessel ischemic disease. Vascular: No hyperdense vessel or unexpected calcification. Skull: No fracture or focal lesion. Sinuses/Orbits: No acute finding. Paranasal sinuses and mastoid air cells are well aerated. Other: Partially visualized endotracheal and enteric tubes. CT CERVICAL SPINE FINDINGS Alignment: Slight anterolisthesis C4 on C5, likely chronic. Skull base and vertebrae: No acute fracture. No primary bone lesion or focal pathologic process. Soft tissues and spinal canal: No prevertebral fluid or swelling. No visible canal hematoma. Disc levels: Multilevel spondylosis, disc space height loss and degenerative endplate changes greatest at C5-C6 where it is advanced. Posterior disc osteophyte complex at C5-C6 causes mild effacement of the ventral thecal sac. Multilevel uncovertebral spurring and facet arthropathy causes neural foraminal narrowing greatest at C5-C6 where it is advanced bilaterally. Additional advanced neural foraminal narrowing on  the right at C3-C4. Ankylosis of the bilateral C4-C5 facets. Upper chest: Negative. Other: None. IMPRESSION: 1. No acute intracranial abnormality. Generalized atrophy and small vessel white matter disease. 2. No acute fracture in the cervical spine. Multilevel degenerative spondylosis. Electronically Signed   By: Placido Sou M.D.   On: 06/23/2022 17:12   CT Cervical Spine Wo Contrast  Result Date: 06/23/2022 CLINICAL DATA:  Altered mental status, found down EXAM: CT HEAD WITHOUT CONTRAST CT CERVICAL SPINE WITHOUT CONTRAST TECHNIQUE: Multidetector CT imaging of the head and cervical spine was performed following the standard protocol without intravenous contrast. Multiplanar CT image reconstructions of the cervical spine were also generated. RADIATION DOSE REDUCTION: This exam was performed according to the departmental dose-optimization  program which includes automated exposure control, adjustment of the mA and/or kV according to patient size and/or use of iterative reconstruction technique. COMPARISON:  None Available. FINDINGS: CT HEAD FINDINGS Brain: No intracranial hemorrhage, mass effect, or evidence of acute infarct. No hydrocephalus. No extra-axial fluid collection. Generalized cerebral atrophy. Ill-defined hypoattenuation within the cerebral white matter is nonspecific but consistent with chronic small vessel ischemic disease. Vascular: No hyperdense vessel or unexpected calcification. Skull: No fracture or focal lesion. Sinuses/Orbits: No acute finding. Paranasal sinuses and mastoid air cells are well aerated. Other: Partially visualized endotracheal and enteric tubes. CT CERVICAL SPINE FINDINGS Alignment: Slight anterolisthesis C4 on C5, likely chronic. Skull base and vertebrae: No acute fracture. No primary bone lesion or focal pathologic process. Soft tissues and spinal canal: No prevertebral fluid or swelling. No visible canal hematoma. Disc levels: Multilevel spondylosis, disc space height loss and degenerative endplate changes greatest at C5-C6 where it is advanced. Posterior disc osteophyte complex at C5-C6 causes mild effacement of the ventral thecal sac. Multilevel uncovertebral spurring and facet arthropathy causes neural foraminal narrowing greatest at C5-C6 where it is advanced bilaterally. Additional advanced neural foraminal narrowing on the right at C3-C4. Ankylosis of the bilateral C4-C5 facets. Upper chest: Negative. Other: None. IMPRESSION: 1. No acute intracranial abnormality. Generalized atrophy and small vessel white matter disease. 2. No acute fracture in the cervical spine. Multilevel degenerative spondylosis. Electronically Signed   By: Placido Sou M.D.   On: 06/23/2022 17:12   DG Chest Portable 1 View  Result Date: 06/23/2022 CLINICAL DATA:  Altered mental status, difficulty breathing EXAM: PORTABLE CHEST 1  VIEW COMPARISON:  None Available. FINDINGS: Cardiac size is within normal limits. There are no signs of alveolar edema or focal pulmonary consolidation. Small linear densities are seen in left lower lung field suggesting scarring or subsegmental atelectasis. There is minimal blunting of left lateral CP angle. There is no pneumothorax. Tip of endotracheal tube is 4 cm above the carina. NG tube is noted traversing the esophagus. IMPRESSION: There are no signs of pulmonary edema or focal pulmonary consolidation. Subtle increase in interstitial markings in the lateral left lower lung field may suggest scarring or subsegmental atelectasis. Possible minimal left pleural effusion. Electronically Signed   By: Elmer Picker M.D.   On: 06/23/2022 16:27    Procedures .Critical Care  Performed by: Elgie Congo, MD Authorized by: Elgie Congo, MD   Critical care provider statement:    Critical care time (minutes):  45   Critical care was necessary to treat or prevent imminent or life-threatening deterioration of the following conditions:  Respiratory failure and toxidrome   Critical care was time spent personally by me on the following activities:  Development of treatment plan with patient or surrogate, discussions  with consultants, evaluation of patient's response to treatment, examination of patient, ordering and review of laboratory studies, ordering and review of radiographic studies, ordering and performing treatments and interventions, pulse oximetry, re-evaluation of patient's condition, review of old charts and obtaining history from patient or surrogate   Care discussed with: admitting provider   Procedure Name: Intubation Date/Time: 06/23/2022 4:36 PM  Performed by: Elgie Congo, MDPre-anesthesia Checklist: Patient identified, Patient being monitored, Emergency Drugs available, Timeout performed and Suction available Oxygen Delivery Method: Non-rebreather mask Preoxygenation:  Pre-oxygenation with 100% oxygen Induction Type: Rapid sequence Ventilation: Mask ventilation without difficulty Grade View: Grade II Tube size: 7.0 mm Number of attempts: 1 Placement Confirmation: ETT inserted through vocal cords under direct vision, CO2 detector and Breath sounds checked- equal and bilateral Secured at: 21 cm Tube secured with: ETT holder Comments: Confirmed on XR        Medications Ordered in ED Medications  fentaNYL 2522mg in NS 2576m(1087mml) infusion-PREMIX (400 mcg/hr Intravenous Rate/Dose Change 8/48/4/1626063folic acid (FOLVITE) tablet 1 mg (has no administration in time range)  potassium chloride (KLOR-CON) packet 40 mEq (has no administration in time range)  calcium gluconate 1 g/ 50 mL sodium chloride IVPB (1,000 mg Intravenous New Bag/Given 06/23/22 1745)  propofol (DIPRIVAN) 1000 MG/100ML infusion (10 mcg/kg/min  62.6 kg Intravenous Rate/Dose Change 06/23/22 1810)  Ampicillin-Sulbactam (UNASYN) 3 g in sodium chloride 0.9 % 100 mL IVPB (3 g Intravenous New Bag/Given 06/23/22 1750)  heparin injection 5,000 Units (5,000 Units Subcutaneous Given 06/23/22 1739)  pantoprazole (PROTONIX) injection 40 mg (has no administration in time range)  lactated ringers infusion ( Intravenous New Bag/Given 06/23/22 1739)  polyethylene glycol (MIRALAX / GLYCOLAX) packet 17 g (has no administration in time range)  docusate (COLACE) 50 MG/5ML liquid 100 mg (has no administration in time range)  succinylcholine (ANECTINE) 200 MG/10ML syringe (100 mg Intravenous Given 06/23/22 1525)  etomidate (AMIDATE) 2 MG/ML injection (20 mg  Given 06/23/22 1524)  fentaNYL (SUBLIMAZE) 50 MCG/ML injection (50 mcg  Given 06/23/22 1529)  naloxone (NARCAN) injection 0.4 mg (0.4 mg Intravenous Given 06/23/22 1518)  thiamine (VITAMIN B1) injection 100 mg (100 mg Intravenous Given 06/23/22 1740)    ED Course/ Medical Decision Making/ A&P Clinical Course as of 06/23/22 1814  Fri Jun 23, 2022  1631 Spoke with  Dr. RahAne Paymenttensivist who will be down to evaluate patient put in orders for admission. [VB]  1810 Labs remarkable for mild hypokalemia 3.3 which has been repleted.  Glucose 101.  Magnesium pending.  CT head on independent interpretation negative for ICH.  Patient admitted to intensivist. [VB]    Clinical Course User Index [VB] BraElgie CongoD                            MDM MarSHANISE BALCH a 60 81o. female.  With PMH of alcohol use disorder, depression, recently here for SI discharged home with Librium presents as concern for trazodone overdose.    Patient presented consistent with sedative overdose including trazodone, Librium, Ativan.  She was brought apneic breathing at 5 times a minute not protecting her airway with a GCS of 7.  She had no response to IV Narcan 2 mg and have received 2 mg IM Narcan from EMS.  Pupils were 3 and equal.  She had no evidence of head trauma.  She was intubated for airway protection.  Initial blood glucose 102 within normal  limi ts.  Intubated with etomidate and succinylcholine.  She had mild hypokalemia 3.3 which was repleted but no other acute electrolyte abnormalities.  Spoke with [place] poison control who recommended normal potassium and magnesium and repeat EKG in 4 hours due to QTc prolongation 482.  No ischemic changes on EKG which was normal sinus rhythm with no QRS widening.  She was also given thiamine and folate for history of alcohol use disorder.  CT head on interpretation independently with no evidence of ICH.  She was discussed with ICU who put in orders for admission.  Final Clinical Impression(s) / ED Diagnoses Final diagnoses:  Intentional overdose, initial encounter ([place])  Acute respiratory failure, unspecified whether with hypoxia or hypercapnia [facility])    Rx / DC Orders ED Discharge Orders     None         Elgie Congo, MD 06/23/22 1814

## 2022-06-23 NOTE — H&P (Addendum)
NAME:  Debra Rhodes, MRN:  [id number], DOB:  10-Nov-1961, LOS: 0 ADMISSION DATE:  06/23/2022, CONSULTATION DATE:  06/23/22 REFERRING MD:  Nechama Guard CHIEF COMPLAINT:  Overdose   History of Present Illness:  Debra Rhodes is a 61 y.o. female who has a PMH as below. She presented to [facility] ED 8/4 after being found unresponsive in her home by sponsor who went to check on her after pt had told sponsor and ex-boyfriend that she was over it and wanted to kill herself. EMS was called to the home and found pt minimally responsive. It was suspected that she had taken at least #50 '50mg'$  pills of Trazodone along with Ativan, Librium, and alcohol. She had been seen in ED 2 days prior for anxiety and wanting to get help for alcohol. She initially came in with SI; however, she denied any intention to EDP and stated that she was just very depressed and was wanting to quit drinking and smoking. She was discharged home on Librium.  In ED, she had GCS of around 5 and required intubation for airway protection. It was felt that she had likely aspirated prior to intubation. Post intubation, poison control was contacted and recommended checking APAP and salicylate levels along with maintaining K > 4 and Mg > 2. Thiamine and Folate were also recommended due to hx of EtOH dependence and abuse (per last ED visit notes, she had recently been drinking up to 3 bottles of wine per day).  Pertinent  Medical History:  has S/P right TKA; Overweight (BMI 25.0-29.9); Pain in right knee; Synovial cyst of knee; Alcohol abuse; MDD (major depressive disorder), recurrent severe, without psychosis ([place]); Suicidal ideation; Depression; Iron deficiency anemia; Mixed hyperlipidemia; History of alcohol abuse; Elevated serum hCG; Fatigue; Generalized anxiety disorder; Osteoarthritis of left knee; Osteoarthrosis of hand; Pain in joint of right shoulder; Deviated septum; Mixed conductive and sensorineural hearing loss of both ears; Nasal congestion; Nasal  turbinate hypertrophy; Neoplasm of uncertain behavior of skin; Rhinitis, chronic; Tinnitus aurium, bilateral; Acute non-recurrent frontal sinusitis; Chronic alcoholism in remission ([place]); Iron deficiency; Other specified behavioral and emotional disorders with onset usually occurring in childhood and adolescence; Primary insomnia; Raynaud's disease; and Overdose on their problem list.  Significant Hospital Events: Including procedures, antibiotic start and stop dates in addition to other pertinent events   8/4 admit.  Interim History / Subjective:  Sedated on propofol and fentanyl. CT head pending.  Objective:  Blood pressure (!) 147/115, pulse 77, resp. rate 16, SpO2 100 %.    Vent Mode: PRVC FiO2 (%):  [40 %-60 %] 40 % Set Rate:  [16 bmp] 16 bmp Vt Set:  [420 mL-500 mL] 420 mL PEEP:  [5 cmH20] 5 cmH20 Plateau Pressure:  [13 cmH20] 13 cmH20  No intake or output data in the 24 hours ending 06/23/22 1722 There were no vitals filed for this visit.  Examination: General: Adult female, resting in bed, in NAD. Neuro: Sedated, not responsive. HEENT: [place]/AT. Sclerae anicteric. ETT in place. Cardiovascular: RRR, no M/R/G.  Lungs: Respirations even and unlabored.  CTA bilaterally, No W/R/R. Abdomen: BS x 4, soft, NT/ND.  Musculoskeletal: No gross deformities, no edema.  Skin: Intact, warm, no rashes.  Labs/imaging personally reviewed:  CT head 8/4 >   Assessment & Plan:   Intentional Overdose - presumed as part of SI. Per report, at least #50 '50mg'$  Trazodone along with Ativan, Librium, Alcohol. EtOH, APAP, salicylates, UDS not drawn yet. - Supportive care. - Maintain K > 4,  Mg > 2. - EKG q4hrs, and follow QTc specifically. - Check EtOH, APAP, salicylates, UDS. - F/u on CT head. - Suicide precautions. - Will need psychiatry assessment once extubated.  Respiratory insufficiency with inability to protect the airway - s/p intubation in ED. Presumed aspiration event. - Full vent  support. - Wean as mental status allows. - Empiric unasyn. - Sputum culture. - Bronchial hygiene. - Follow CXR.  Hypokalemia. Hypocalcemia. - 40 mEq K per tube. - 1g Ca gluconate. - Repeat BMP tonight at 2100 and again in AM.  Hx Anxiety, Depression, EtOH dependence. - Hold home Chlordiazepoxide, Duloxetine, Gabapentin, Trazodone. - Thiamine, Folate. - Psychiatry consult as above once extubated and able to participate. - EtOH cessation counseling.   Best practice (evaluated daily):  Diet/type: NPO DVT prophylaxis: prophylactic heparin  GI prophylaxis: PPI Lines: N/A Foley:  N/A Code Status:  full code Last date of multidisciplinary goals of care discussion: None yet.  Labs   CBC: Recent Labs  Lab 06/21/22 2106 06/23/22 1514 06/23/22 1518  WBC 6.9 5.0  --   NEUTROABS 4.2  --   --   HGB 13.5 12.4 12.2  HCT 39.5 36.8 36.0  MCV 93.8 96.1  --   PLT 231 178  --     Basic Metabolic Panel: Recent Labs  Lab 06/21/22 2106 06/23/22 1514 06/23/22 1518  NA 140 138 138  K 4.0 3.4* 3.3*  CL 105 104 103  CO2 24 23  --   GLUCOSE 97 102* 101*  BUN 8 7* 8  CREATININE 0.57 0.71 0.90  CALCIUM 8.9 8.3*  --    GFR: Estimated Creatinine Clearance: 55.5 mL/min (by C-G formula based on SCr of 0.9 mg/dL). Recent Labs  Lab 06/21/22 2106 06/23/22 1514  WBC 6.9 5.0    Liver Function Tests: Recent Labs  Lab 06/21/22 2106  AST 31  ALT 18  ALKPHOS 45  BILITOT 0.5  PROT 7.1  ALBUMIN 4.0   No results for input(s): "LIPASE", "AMYLASE" in the last 168 hours. No results for input(s): "AMMONIA" in the last 168 hours.  ABG    Component Value Date/Time   TCO2 23 06/23/2022 1518     Coagulation Profile: No results for input(s): "INR", "PROTIME" in the last 168 hours.  Cardiac Enzymes: No results for input(s): "CKTOTAL", "CKMB", "CKMBINDEX", "TROPONINI" in the last 168 hours.  HbA1C: Hgb A1c MFr Bld  Date/Time Value Ref Range Status  10/27/2021 06:19 AM 5.3 4.8 -  5.6 % Final    Comment:    (NOTE) Pre diabetes:          5.7%-6.4%  Diabetes:              >6.4%  Glycemic control for   <7.0% adults with diabetes   01/19/2021 06:22 AM 5.2 4.8 - 5.6 % Final    Comment:    (NOTE) Pre diabetes:          5.7%-6.4%  Diabetes:              >6.4%  Glycemic control for   <7.0% adults with diabetes     CBG: Recent Labs  Lab 06/23/22 1516  GLUCAP 102*    Review of Systems:   Unable to obtain as pt is encephalopathic.  Past Medical History:  She,  has a past medical history of Anxiety, Depression, and PONV (postoperative nausea and vomiting).   Surgical History:   Past Surgical History:  Procedure Laterality Date   AUGMENTATION MAMMAPLASTY Bilateral  1996   BACK SURGERY     CESAREAN SECTION     FRACTURE SURGERY     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   Procedure: TOTAL KNEE ARTHROPLASTY;  Surgeon: Paralee Cancel, MD;  Location: [facility] ORS;  Service: Orthopedics;  Laterality: Right;  70 mins     Social History:   reports that she has never smoked. She has never used smokeless tobacco. She reports current alcohol use. She reports that she does not use drugs.   Family History:  Her family history includes Alcohol abuse in her brother, brother, and sister; Anxiety disorder in her brother and sister; Depression in her brother; Schizophrenia in her brother; Suicidality in her brother.   Allergies Allergies  Allergen Reactions   Latex Shortness Of Breath, Swelling and Rash     Home Medications  Prior to Admission medications   Medication Sig Start Date End Date Taking? Authorizing Provider  cetirizine (ZYRTEC) 10 MG tablet Take 10 mg by mouth daily.    [provider]  chlordiazePOXIDE (LIBRIUM) 25 MG capsule '50mg'$  PO TID x 1D, then 25-'50mg'$  PO BID X 1D, then 25-'50mg'$  PO QD X 1D 06/21/22   Drenda Freeze, MD  DULoxetine (CYMBALTA) 30 MG capsule Take 1 capsule (30 mg total) by mouth daily. Take with a 60 mg capsule to equal  total daily dose of 90 mg 05/30/22 08/28/22  Thayer Headings, PMHNP  DULoxetine (CYMBALTA) 60 MG capsule Take 1 capsule (60 mg total) by mouth daily. Take with a 30 mg capsule to equal total daily dose of 90 mg. 05/30/22   Thayer Headings, PMHNP  estradiol-norethindrone [facility]) 0.05-0.14 MG/DAY Place 1 patch onto the skin once a week.    [provider]  gabapentin (NEURONTIN) 100 MG capsule Take 100 mg in the morning and 100 mg at noon, then take (2) capsules 200 mg in the evening. 05/30/22   Thayer Headings, PMHNP  Multiple Vitamin (MULTIVITAMIN) tablet Take 1 tablet by mouth daily.    [provider]  naltrexone (DEPADE) 50 MG tablet Take 1 tablet (50 mg total) by mouth daily. 05/30/22   Thayer Headings, PMHNP  traZODone (DESYREL) 50 MG tablet Take 1-2 tabs at bedtime as needed for insomnia 05/30/22   Thayer Headings, PMHNP  sertraline (ZOLOFT) 100 MG tablet Decrease to 1.5 tabs daily for 5 days, then 1 tab daily for 5 days, then 1/2 tablet daily for 5 days, then stop 11/23/20 12/23/20  Thayer Headings, PMHNP     Critical care time: 40 min.    Montey Hora, [place] [facility] For pager details, please see AMION or use Epic chat  After 1900, please call [facility] for cross coverage needs 06/23/2022, 5:22 PM

## 2022-06-23 NOTE — Progress Notes (Signed)
Pharmacy Antibiotic Note  Debra Rhodes is a 61 y.o. female admitted on 06/23/2022 presenting found down, concern for aspiration.  Pharmacy has been consulted for Unasyn dosing.  Plan: Unasyn 3g IV every 6 hours Monitor renal function, clinical progression and LOT     No data recorded.  Recent Labs  Lab 06/21/22 2106 06/23/22 1514 06/23/22 1518  WBC 6.9 5.0  --   CREATININE 0.57 0.71 0.90    Estimated Creatinine Clearance: 55.5 mL/min (by C-G formula based on SCr of 0.9 mg/dL).    Allergies  Allergen Reactions   Latex Shortness Of Breath, Swelling and Rash    Bertis Ruddy, PharmD Clinical Pharmacist ED Pharmacist Phone # [phone] 06/23/2022 4:25 PM

## 2022-06-23 NOTE — Progress Notes (Addendum)
[place] Progress Note Patient Name: Debra Rhodes DOB: 02-Jun-1961 MRN: [id number]   Date of Service  06/23/2022  HPI/Events of Note  BP 74/54. MAPs >60s. On Fentanyl 300. Propofol discontinued Camera check: RASS -4 with RN at bedside  Recent glucose 78  eICU Interventions  Wean Fentanyl for RASS goal -1 LR bolus 500 cc followed by LR D5 gtt x 12 hours Trend CBG   10:09 PM - Remains unchanged SBP in 70s. Fentanyl gtt weaned to 175 with improvement. Ordered LR bolus 1L. May consider low-dose pressor if remains hypotensive.  Intervention Category Intermediate Interventions: Hypotension - evaluation and management  Lex Linhares Rodman Pickle 06/23/2022, 8:20 PM

## 2022-06-24 ENCOUNTER — Inpatient Hospital Stay (HOSPITAL_COMMUNITY): Payer: BC Managed Care – PPO

## 2022-06-24 LAB — GLUCOSE, CAPILLARY
Glucose-Capillary: 98 mg/dL (ref 70–99)
Glucose-Capillary: 100 mg/dL — ABNORMAL HIGH (ref 70–99)
Glucose-Capillary: 96 mg/dL (ref 70–99)

## 2022-06-24 LAB — BASIC METABOLIC PANEL
Anion gap: 8 (ref 5–15)
BUN: 5 mg/dL — ABNORMAL LOW (ref 8–23)
CO2: 24 mmol/L (ref 22–32)
Calcium: 8.3 mg/dL — ABNORMAL LOW (ref 8.9–10.3)
Chloride: 106 mmol/L (ref 98–111)
Creatinine, Ser: 0.68 mg/dL (ref 0.44–1.00)
GFR, Estimated: >60 mL/min (ref >60)
Glucose, Bld: 96 mg/dL (ref 70–99)
Potassium: 3.6 mmol/L (ref 3.5–5.1)
Sodium: 138 mmol/L (ref 135–145)

## 2022-06-24 LAB — POCT I-STAT 7, (LYTES, BLD GAS, ICA,H+H)
Acid-Base Excess: 4 mmol/L — ABNORMAL HIGH (ref 0.0–2.0)
Bicarbonate: 27.6 mmol/L (ref 20.0–28.0)
Calcium, Ion: 1.2 mmol/L (ref 1.15–1.40)
HCT: 31 % — ABNORMAL LOW (ref 36.0–46.0)
Hemoglobin: 10.5 g/dL — ABNORMAL LOW (ref 12.0–15.0)
O2 Saturation: 99 %
Patient temperature: 100
Potassium: 3.5 mmol/L (ref 3.5–5.1)
Sodium: 140 mmol/L (ref 135–145)
TCO2: 29 mmol/L (ref 22–32)
pCO2 arterial: 40 mmHg (ref 32–48)
pH, Arterial: 7.45 (ref 7.35–7.45)
pO2, Arterial: 131 mmHg — ABNORMAL HIGH (ref 83–108)

## 2022-06-24 LAB — CBC
HCT: 32.8 % — ABNORMAL LOW (ref 36.0–46.0)
Hemoglobin: 11.2 g/dL — ABNORMAL LOW (ref 12.0–15.0)
MCH: 32.1 pg (ref 26.0–34.0)
MCHC: 34.1 g/dL (ref 30.0–36.0)
MCV: 94 fL (ref 80.0–100.0)
Platelets: 169 10*3/uL (ref 150–400)
RBC: 3.49 MIL/uL — ABNORMAL LOW (ref 3.87–5.11)
RDW: 12.9 % (ref 11.5–15.5)
WBC: 6.7 10*3/uL (ref 4.0–10.5)
nRBC: 0 % (ref 0.0–0.2)

## 2022-06-24 LAB — VITAMIN B12: Vitamin B-12: 354 pg/mL (ref 180–914)

## 2022-06-24 LAB — MAGNESIUM: Magnesium: 1.4 mg/dL — ABNORMAL LOW (ref 1.7–2.4)

## 2022-06-24 LAB — FOLATE: Folate: 13.8 ng/mL (ref >5.9)

## 2022-06-24 MED ORDER — POTASSIUM CHLORIDE 20 MEQ PO PACK
40.0000 meq | PACK | Freq: Once | ORAL | Status: AC
  Administered 2022-06-24: 40 meq
  Filled 2022-06-24: qty 2

## 2022-06-24 MED ORDER — CHLORHEXIDINE GLUCONATE CLOTH 2 % EX PADS
6.0000 | MEDICATED_PAD | Freq: Every day | CUTANEOUS | Status: DC
  Administered 2022-06-23 – 2022-06-24 (×2): 6 via TOPICAL

## 2022-06-24 MED ORDER — SODIUM CHLORIDE 0.9 % IV BOLUS
500.0000 mL | Freq: Once | INTRAVENOUS | Status: AC
  Administered 2022-06-24: 500 mL via INTRAVENOUS

## 2022-06-24 MED ORDER — SODIUM CHLORIDE 0.9 % IV SOLN: 250.0000 mL | INTRAVENOUS | Status: DC

## 2022-06-24 MED ORDER — MAGNESIUM SULFATE 4 GM/100ML IV SOLN
4.0000 g | Freq: Once | INTRAVENOUS | Status: AC
  Administered 2022-06-24: 4 g via INTRAVENOUS
  Filled 2022-06-24: qty 100

## 2022-06-24 MED ORDER — NOREPINEPHRINE 4 MG/250ML-% IV SOLN
2.0000 ug/min | INTRAVENOUS | Status: DC
  Administered 2022-06-24: 2 ug/min via INTRAVENOUS
  Filled 2022-06-24: qty 250

## 2022-06-24 NOTE — H&P (Addendum)
NAME:  Debra Rhodes, MRN:  [id number], DOB:  [birth year], LOS: 1 ADMISSION DATE:  06/23/2022, CONSULTATION DATE:  06/23/22 REFERRING MD:  Nechama Guard CHIEF COMPLAINT:  Overdose   History of Present Illness:  Debra Rhodes is a 61 y.o. female who has a PMH as below. She presented to [facility] ED 8/4 after being found unresponsive in her home by sponsor who went to check on her after pt had told sponsor and ex-boyfriend that she was over it and wanted to kill herself. EMS was called to the home and found pt minimally responsive. It was suspected that she had taken at least #50 '50mg'$  pills of Trazodone along with Ativan, Librium, and alcohol. She had been seen in ED 2 days prior for anxiety and wanting to get help for alcohol. She initially came in with SI; however, she denied any intention to EDP and stated that she was just very depressed and was wanting to quit drinking and smoking. She was discharged home on Librium.  In ED, she had GCS of around 5 and required intubation for airway protection. It was felt that she had likely aspirated prior to intubation. Post intubation, poison control was contacted and recommended checking APAP and salicylate levels along with maintaining K > 4 and Mg > 2. Thiamine and Folate were also recommended due to hx of EtOH dependence and abuse (per last ED visit notes, she had recently been drinking up to 3 bottles of wine per day).  Pertinent  Medical History:  has S/P right TKA; Overweight (BMI 25.0-29.9); Pain in right knee; Synovial cyst of knee; Alcohol abuse; MDD (major depressive disorder), recurrent severe, without psychosis ([place]); Suicidal ideation; Depression; Iron deficiency anemia; Mixed hyperlipidemia; History of alcohol abuse; Elevated serum hCG; Fatigue; Generalized anxiety disorder; Osteoarthritis of left knee; Osteoarthrosis of hand; Pain in joint of right shoulder; Deviated septum; Mixed conductive and sensorineural hearing loss of both ears; Nasal congestion; Nasal  turbinate hypertrophy; Neoplasm of uncertain behavior of skin; Rhinitis, chronic; Tinnitus aurium, bilateral; Acute non-recurrent frontal sinusitis; Chronic alcoholism in remission ([place]); Iron deficiency; Other specified behavioral and emotional disorders with onset usually occurring in childhood and adolescence; Primary insomnia; Raynaud's disease; Overdose; Acute respiratory failure ([place]); Hypokalemia; and Acute encephalopathy on their problem list.  Significant Hospital Events: Including procedures, antibiotic start and stop dates in addition to other pertinent events   8/4 admitted to ICU  Interim History / Subjective:  Sedation weaned. Awake, says she is ready to be extubated.  Objective:  Blood pressure 126/78, pulse 92, temperature 99.9 F (37.7 C), temperature source Axillary, resp. rate 12, SpO2 97 %.    Vent Mode: PRVC FiO2 (%):  [40 %-60 %] 40 % Set Rate:  [16 bmp] 16 bmp Vt Set:  [420 mL-500 mL] 420 mL PEEP:  [5 cmH20] 5 cmH20 Pressure Support:  [10 cmH20] 10 cmH20 Plateau Pressure:  [12 cmH20-15 cmH20] 15 cmH20   Intake/Output Summary (Last 24 hours) at 06/24/2022 1117 Last data filed at 06/24/2022 0900 Gross per 24 hour  Intake 3212.8 ml  Output 1625 ml  Net 1587.8 ml   There were no vitals filed for this visit.  Examination: General: Adult female, resting in bed, in NAD. Neuro: awake, drowsy, responds appropriately to commands, no focal deficits HEENT: ETT in place. Cardiovascular: RRR, no M/R/G.  Lungs: Respirations even and unlabored.  CTA bilaterally, No W/R/R. Abdomen: BS x 4, soft, NT/ND.  Musculoskeletal: No gross deformities, no edema.  Skin: Intact, warm, no rashes.  Labs/imaging personally reviewed:  CT head 8/4    Assessment & Plan:   Intentional Overdose  Presumed as part of SI. No apparent ongoing electrolyte abnormalities or organ dysfunction. Patient does remain drowsy, but does follow commands appropriately. - Supportive care. - Maintain K >  4, Mg > 2. - telemetry - Suicide precautions. - Will need psychiatry assessment once extubated. - continue to monitor in ICU additional 24 hours  Acute Respiratory failure with inability to protect the airway  Intubated in ED. She is requiring only minimal ventilation, but is too drowsy to wean at this time. No evidence of anoxic brain injury at this time. She does remain drowsy - Continue mechanical ventilation - Wean as mental status allows, hopefully later today  Presumed aspiration event Afebrile and no leukocytosis - Empiric unasyn. - Sputum culture. - Bronchial hygiene.  Hypokalemia. Hypocalcemia. - monitor with daily BMP - replete as necessary  Hx Anxiety, Depression, EtOH dependence. - Hold home Chlordiazepoxide, Duloxetine, Gabapentin, Trazodone. - Thiamine, Folate. - Psychiatry consult as above once extubated and able to participate. - EtOH cessation counseling.   Best practice (evaluated daily):  Diet/type: NPO DVT prophylaxis: prophylactic heparin  GI prophylaxis: PPI Lines: N/A Foley:  N/A Code Status:  full code Last date of multidisciplinary goals of care discussion: None yet.  Labs   CBC: Recent Labs  Lab 06/21/22 2106 06/23/22 1514 06/23/22 1518 06/23/22 1723 06/24/22 0305 06/24/22 0519  WBC 6.9 5.0  --   --  6.7  --   NEUTROABS 4.2  --   --   --   --   --   HGB 13.5 12.4 12.2 12.6 11.2* 10.5*  HCT 39.5 36.8 36.0 37.0 32.8* 31.0*  MCV 93.8 96.1  --   --  94.0  --   PLT 231 178  --   --  169  --      Basic Metabolic Panel: Recent Labs  Lab 06/21/22 2106 06/23/22 1514 06/23/22 1518 06/23/22 1723 06/23/22 1725 06/23/22 2113 06/24/22 0305 06/24/22 0519  NA 140 138 138 136  --  139 138 140  K 4.0 3.4* 3.3* 3.7  --  3.6 3.6 3.5  CL 105 104 103  --   --  106 106  --   CO2 24 23  --   --   --  23 24  --   GLUCOSE 97 102* 101*  --   --  96 96  --   BUN 8 7* 8  --   --  7* 5*  --   CREATININE 0.57 0.71 0.90  --   --  0.65 0.68  --    CALCIUM 8.9 8.3*  --   --   --  8.5* 8.3*  --   MG  --   --   --   --  2.1  --  1.4*  --     GFR: Estimated Creatinine Clearance: 62.5 mL/min (by C-G formula based on SCr of 0.68 mg/dL). Recent Labs  Lab 06/21/22 2106 06/23/22 1514 06/24/22 0305  WBC 6.9 5.0 6.7     Liver Function Tests: Recent Labs  Lab 06/21/22 2106 06/23/22 1725  AST 31 36  ALT 18 21  ALKPHOS 45 52  BILITOT 0.5 0.8  PROT 7.1 7.6  ALBUMIN 4.0 4.1    No results for input(s): "LIPASE", "AMYLASE" in the last 168 hours. No results for input(s): "AMMONIA" in the last 168 hours.  ABG    Component Value Date/Time  PHART 7.450 06/24/2022 0519   PCO2ART 40.0 06/24/2022 0519   PO2ART 131 (H) 06/24/2022 0519   HCO3 27.6 06/24/2022 0519   TCO2 29 06/24/2022 0519   O2SAT 99 06/24/2022 0519     Coagulation Profile: No results for input(s): "INR", "PROTIME" in the last 168 hours.  Cardiac Enzymes: No results for input(s): "CKTOTAL", "CKMB", "CKMBINDEX", "TROPONINI" in the last 168 hours.  HbA1C: Hgb A1c MFr Bld  Date/Time Value Ref Range Status  10/27/2021 06:19 AM 5.3 4.8 - 5.6 % Final    Comment:    (NOTE) Pre diabetes:          5.7%-6.4%  Diabetes:              >6.4%  Glycemic control for   <7.0% adults with diabetes   01/19/2021 06:22 AM 5.2 4.8 - 5.6 % Final    Comment:    (NOTE) Pre diabetes:          5.7%-6.4%  Diabetes:              >6.4%  Glycemic control for   <7.0% adults with diabetes     CBG: CBG (last 3)  Recent Labs    06/23/22 2323 06/24/22 0329 06/24/22 0750  GLUCAP 77 98 100*   CC time: 55 min

## 2022-06-24 NOTE — Progress Notes (Signed)
An USGPIV (ultrasound guided PIV) has been placed for short-term vasopressor infusion. A correctly placed ivWatch must be used when administering Vasopressors. Should this treatment be needed beyond 72 hours, central line access should be obtained.  It will be the responsibility of the bedside nurse to follow best practice to prevent extravasations.   ?

## 2022-06-24 NOTE — Consult Note (Signed)
Debra Rhodes is a 61 y.o. female who has a PMH as below. She presented to [facility] ED 8/4 after being found unresponsive in her home by sponsor who went to check on her after pt had told sponsor and ex-boyfriend that she was over it and wanted to kill herself. EMS was called to the home and found pt minimally responsive. It was suspected that she had taken at least #50 '50mg'$  pills of Trazodone along with Ativan, Librium, and alcohol. She had been seen in ED 2 days prior for anxiety and wanting to get help for alcohol. She initially came in with SI; however, she denied any intention to EDP and stated that she was just very depressed and was wanting to quit drinking and smoking. She was discharged home on Librium.  Psych consult placed for suicide attempt, plan to extubate today. Patient seen and attempted to assess, she remains intubated at this time. Due to patient recent presentation to [facility] on 08/02 for anxiety and Alcohol use (3 bottles of wine a day); leaving before being seen by psychiatry. WIll initiate IVC at this time, as she is a danger to herself.   -Post extubation initiate CIWA protocol.   -Will order thiamine level, B12, folate level. Plan to treat with high dose thiamine ('500mg'$ ) pending levels. -Continue suicide sitter -IVC paperwork initiated, will need to be notarized and sent to magistrate. Patient high risk to leave AMA.  -Psychiatry will continue to follow.

## 2022-06-24 NOTE — Progress Notes (Signed)
[place] Progress Note Patient Name: Debra Rhodes DOB: [birth year] MRN: [id number]   Date of Service  06/24/2022  HPI/Events of Note  Patient with urinary retention.  eICU Interventions  Foley protocol ordered.        Kerry Kass Finlee Milo 06/24/2022, 10:24 PM

## 2022-06-24 NOTE — Progress Notes (Signed)
[facility] ADULT ICU REPLACEMENT PROTOCOL   The patient does apply for the [facility] Adult ICU Electrolyte Replacment Protocol based on the criteria listed below:   1.Exclusion criteria: TCTS patients, ECMO patients, and Dialysis patients 2. Is GFR >/= 30 ml/min? Yes.    Patient's GFR today is >60 3. Is SCr </= 2? Yes.   Patient's SCr is 0.68 mg/dL 4. Did SCr increase >/= 0.5 in 24 hours? No. 5.Pt's weight >40kg  Yes.   6. Abnormal electrolyte(s): K+ 3.6, Mag 1.4  7. Electrolytes replaced per protocol 8.  Call MD STAT for K+ </= 2.5, Phos </= 1, or Mag </= 1 Physician:  Antonieta Iba 06/24/2022 4:23 AM

## 2022-06-25 LAB — CBC WITH DIFFERENTIAL/PLATELET
Abs Immature Granulocytes: 0.02 10*3/uL (ref 0.00–0.07)
Basophils Absolute: 0 10*3/uL (ref 0.0–0.1)
Basophils Relative: 0 %
Eosinophils Absolute: 0.2 10*3/uL (ref 0.0–0.5)
Eosinophils Relative: 3 %
HCT: 32.7 % — ABNORMAL LOW (ref 36.0–46.0)
Hemoglobin: 11 g/dL — ABNORMAL LOW (ref 12.0–15.0)
Immature Granulocytes: 0 %
Lymphocytes Relative: 21 %
Lymphs Abs: 1.2 10*3/uL (ref 0.7–4.0)
MCH: 32.2 pg (ref 26.0–34.0)
MCHC: 33.6 g/dL (ref 30.0–36.0)
MCV: 95.6 fL (ref 80.0–100.0)
Monocytes Absolute: 0.6 10*3/uL (ref 0.1–1.0)
Monocytes Relative: 11 %
Neutro Abs: 3.5 10*3/uL (ref 1.7–7.7)
Neutrophils Relative %: 65 %
Platelets: 141 10*3/uL — ABNORMAL LOW (ref 150–400)
RBC: 3.42 MIL/uL — ABNORMAL LOW (ref 3.87–5.11)
RDW: 13.2 % (ref 11.5–15.5)
WBC: 5.5 10*3/uL (ref 4.0–10.5)
nRBC: 0 % (ref 0.0–0.2)

## 2022-06-25 LAB — BASIC METABOLIC PANEL
Anion gap: 9 (ref 5–15)
BUN: 7 mg/dL — ABNORMAL LOW (ref 8–23)
CO2: 21 mmol/L — ABNORMAL LOW (ref 22–32)
Calcium: 8.1 mg/dL — ABNORMAL LOW (ref 8.9–10.3)
Chloride: 106 mmol/L (ref 98–111)
Creatinine, Ser: 0.94 mg/dL (ref 0.44–1.00)
GFR, Estimated: >60 mL/min (ref >60)
Glucose, Bld: 105 mg/dL — ABNORMAL HIGH (ref 70–99)
Potassium: 4.2 mmol/L (ref 3.5–5.1)
Sodium: 136 mmol/L (ref 135–145)

## 2022-06-25 LAB — MAGNESIUM: Magnesium: 2.1 mg/dL (ref 1.7–2.4)

## 2022-06-25 LAB — GLUCOSE, CAPILLARY: Glucose-Capillary: 110 mg/dL — ABNORMAL HIGH (ref 70–99)

## 2022-06-25 LAB — PHOSPHORUS: Phosphorus: 3 mg/dL (ref 2.5–4.6)

## 2022-06-25 MED ORDER — ADULT MULTIVITAMIN W/MINERALS CH
1.0000 | ORAL_TABLET | Freq: Every day | ORAL | Status: DC
  Administered 2022-06-25 – 2022-06-28 (×4): 1 via ORAL
  Filled 2022-06-25 (×4): qty 1

## 2022-06-25 MED ORDER — LORAZEPAM 2 MG/ML IJ SOLN
1.0000 mg | INTRAMUSCULAR | Status: AC | PRN
  Administered 2022-06-26 – 2022-06-27 (×3): 1 mg via INTRAVENOUS
  Filled 2022-06-25 (×4): qty 1

## 2022-06-25 MED ORDER — LORAZEPAM 1 MG PO TABS
1.0000 mg | ORAL_TABLET | ORAL | Status: AC | PRN
  Administered 2022-06-27: 1 mg via ORAL
  Filled 2022-06-25: qty 1

## 2022-06-25 MED ORDER — THIAMINE HCL 100 MG/ML IJ SOLN
100.0000 mg | Freq: Every day | INTRAMUSCULAR | Status: DC
Start: 2022-06-25 — End: 2022-06-28

## 2022-06-25 MED ORDER — FOLIC ACID 1 MG PO TABS: 1.0000 mg | ORAL_TABLET | Freq: Every day | ORAL | Status: DC

## 2022-06-25 MED ORDER — THIAMINE HCL 100 MG PO TABS
100.0000 mg | ORAL_TABLET | Freq: Every day | ORAL | Status: DC
  Administered 2022-06-25 – 2022-06-28 (×4): 100 mg via ORAL
  Filled 2022-06-25 (×4): qty 1

## 2022-06-25 NOTE — Progress Notes (Signed)
Attempted to give report x1. Secretary answered and said she would let the nurse know and she would call me back

## 2022-06-25 NOTE — Progress Notes (Signed)
Patient transported to 5A06 with no complications.

## 2022-06-25 NOTE — Consult Note (Signed)
[facility] Consult   Reason for Consult: Suicide attempt by overdose Referring Physician: Dr. Elsworth Soho Patient Identification: Debra Rhodes MRN:  [id number] Principal Diagnosis: Overdose Diagnosis:  Principal Problem:   Overdose   Total Time spent with patient: 1 hour  Subjective:   Debra Rhodes is a 61 y.o. female patient admitted with suicide attempt by overdose on trazodone, Xanax, and alcohol.  Patient previous psychiatric history of anxiety disorder, major depressive disorder, ADHD inattentive type who presented from home after a suicide attempt.  Patient is currently enrolled in Alcoholics Anonymous, did make multiple attempts to reach out to peers and sponsor.  Despite having touch base and having appeared to come sit with patient while at home she proceeded with suicide attempt.  She is unable to recall events leading up to her suicide attempt.  There is 1 isolated event in which she remembers crying, and a woman outside offered her 3 Xanax to help make her feel better.  She did take the Xanax.  She is unable to recall drinking alcohol, however her blood alcohol level on admission was 233 and urine drug screen was positive for benzodiazepines.  Of note patient with suicide attempt by overdose in the setting of alcohol relapse.  She currently receives outpatient services at [facility], under the care of [place] Headings.  Her last outpatient appointment was July 11 in which she was stable at that time.  She also presented to [facility] emergency department 2 days prior to her suicide attempt, blood alcohol level at that time of 250.  Patient offered Librium detox and therapeutic triage assessment in which she declined and stated she was ready to go home.  She reports that in the context of her relapse she plan to commit suicide by overdose.  She did make multiple attempts to contact her sponsor, peers, family, and daughters.  She had initiated contact with her  ex-boyfriend, who found her at home as he was coming over to sit with her during this period of hard time.  Patient tells me she attempted suicide once before 5 years ago.  On chart review patient has had multiple suicide attempts, most recent attempt being in August 2022 in which she had taken pills and was found by alcohol from someone in her [place] group who reached out to patient for assistance.  On assessment today, patient reports feeling weak and lethargic, was recently extubated prior to psychiatric evaluation.  Patient is unsure what led to her relapse and or suicide attempt.  She does deny all psychiatric symptoms during this psychiatric evaluation.  Patient reports her main concern at this time is lack of insurance, and requests to go home.  Did discuss with patient lack of insurance is not a barrier to receiving health care and/or mental health services.  She is encouraged to pursue inpatient psychiatric admission, for management of anxiety and depression and stabilization.  Patient is unable to recall psychotropic medications in which she is being managed, and or compliant with leading up to her suicide attempt.  Furthermore she does not remember which medication she took to aid in her overdose.  Patient did begin to doze in and out, noted to have pinpoint pupils, and difficulty staying awake.  Patient did provide consent to obtain additional information from her daughter Debra Rhodes who is also outside.  Patient did engage a little more once her daughter returned to her room, and offered assistance with unlocking of her cell phone.  Patient is  advised she can assist with unlocking of the phone however she is not able to keep her electronic device that she is on suicide precautions.     Patient was seen today in the intensive care unit waiting for transfer to the medical floor.  Although she denies any suicidality, acute psychiatric symptoms, she remains in the hospital for suicide attempt of high  lethality.  Patient does become tearful when advised she has been placed under involuntary commitment and will not be able to return home and or leave the hospital.  Patient is a poor historian as she endorses 1 previous suicide attempt, chart review shows multiple suicide attempts by overdose in the context of relapse.  She is unable to recall compliance of psychotropic medication, however chart review shows patient is prescribed gabapentin, trazodone, and duloxetine.  Patient denies any new triggers and or stressors that resulted in her most recent suicide attempt.  She endorses daily alcohol use, which has been reduce while in AA.  She admits to using 3 Xanax pills from a lady outside, urine drug screen was positive for benzodiazepines. Patient meets criteria for inpatient hospitalization.  Will refrain from resuming home medications at this time likely for an additional 24 hours due to patient not knowing medications she took.  Collateral information is obtained from daughter Cristie Hem see below.  Patient did send out several text messages to multiple individuals,, approximately 5 seeking assistance during her times of relapse.  She appeared to have initiated communication on Tuesday and Wednesday leading up to Friday's suicide attempt.  Coralyn Mark " I am having a really bad morning.  Sitting inside at my neighbor's house. *Crystal " feeling less than were the right now." *Sonia Side" it is okay.  I do not think made for this world and I am so glad to end it." *Barnetta Chapel-" Haney.  I have not had a good day.  Please take care of Mimi and Hattie.  I am not trying to get some attention." Nicole Kindred-" can you come sit with me?  Afraid to call Sharon(sponsor). "  This message was sent at 1:12 PM patient was found at home after requesting help.  Daughter also cites concerns for sending patient home after inpatient hospitalization, she is interested in pursuing involuntary inpatient rehab.  She is advised these discussions will be  held after inpatient hospitalization has been completed.  Daughter was also able to provide detailed family history.  All questions, comments, and concerns were addressed by both parties.  HPI: See above  Past Psychiatric History:  From a previous admission 8/22: "Patient has a longstanding history of depression as well as alcohol dependence.  She has been to detox and rehab in the past.  She stated the last time she was in rehab was approximately 5 years ago.  She has had success with sobriety for an extended period of time with alcoholics anonymous.  She has been in treatment for depression with a nurse practitioner locally.  She is currently receiving outpatient services at [facility] with [place] Headings.  She is currently being managed with trazodone, duloxetine, and gabapentin.  She is unable to recall if she was compliant with this medication.  She has been on paroxetine for depression.  Previously we will attempt to switch her to sertraline, but she did worse with that.  She was admitted to [facility] in March and August 2022.   -She reports history of 1 previous suicide attempt 5 years ago, by overdose which she did not require medical  admission however was admitted to inpatient rehab.  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  Paternal uncle suicide completion.  Father history of substance issues.  Significant family history on maternal side for substance use.  Daughters are diagnosed with anxiety and depression.  Social History:  Social History   Substance and Sexual Activity  Alcohol Use Yes   Comment: binge drinking- was in recovery for past 4 years     Social History   Substance and Sexual Activity  Drug Use No    Social History   Socioeconomic History   Marital status: Divorced    Spouse name: Not on file   Number of children: Not on file   Years of education: Not on file   Highest education level: Not on file  Occupational History   Not on file  Tobacco Use   Smoking status: Never   Smokeless tobacco: Never  Vaping Use   Vaping Use: Never used  Substance and Sexual Activity   Alcohol use: Yes    Comment: binge drinking- was in recovery for past 4 years   Drug use: No   Sexual activity: Not Currently  Other Topics Concern   Not on file  Social History Narrative   Not on file   Social Determinants of Health   Financial Resource Strain: Not on file  Food Insecurity: Not on file  Transportation Needs: Not on file  Physical Activity: Not on file  Stress: Not on file  Social Connections: Not on file   Additional Social History:    Allergies:   Allergies  Allergen Reactions   Latex Shortness Of Breath, Swelling and Rash    Labs:  Results for orders placed or performed during the hospital encounter of 06/23/22 (from the past 48 hour(s))  CBC     Status: Abnormal   Collection Time: 06/23/22  3:14 PM  Result Value Ref Range   WBC 5.0 4.0 - 10.5 K/uL   RBC 3.83 (L) 3.87 - 5.11 MIL/uL    Hemoglobin 12.4 12.0 - 15.0 g/dL   HCT 36.8 36.0 - 46.0 %   MCV 96.1 80.0 - 100.0 fL   MCH 32.4 26.0 - 34.0 pg   MCHC 33.7 30.0 - 36.0 g/dL   RDW 12.5 11.5 - 15.5 %   Platelets 178 150 - 400 K/uL   nRBC 0.0 0.0 - 0.2 %    Comment: Performed at  [facility], [place] [address] [place], [place] [address]  Basic metabolic panel     Status: Abnormal   Collection Time: 06/23/22  3:14 PM  Result Value Ref Range   Sodium 138 135 - 145 mmol/L   Potassium 3.4 (L) 3.5 - 5.1 mmol/L   Chloride 104 98 - 111 mmol/L   CO2 23 22 - 32 mmol/L   Glucose, Bld 102 (H) 70 - 99 mg/dL    Comment: Glucose reference range applies only to samples taken after fasting for at least 8 hours.   BUN 7 (L) 8 - 23 mg/dL   Creatinine, Ser 0.71 0.44 - 1.00 mg/dL   Calcium 8.3 (L) 8.9 - 10.3 mg/dL   GFR, Estimated >60 >60 mL/min    Comment: (NOTE) Calculated using the CKD-EPI Creatinine Equation (2021)    Anion gap 11 5 - 15    Comment: Performed at [facility] [address] [place], [place] [address]  CBG monitoring, ED     Status: Abnormal   Collection Time: 06/23/22  3:16 PM  Result Value Ref Range   Glucose-Capillary 102 (H) 70 - 99 mg/dL    Comment: Glucose reference range applies only to samples taken after fasting for at least 8 hours.  I-stat chem 8, ed     Status: Abnormal   Collection Time: 06/23/22  3:18 PM  Result Value Ref Range   Sodium 138 135 - 145 mmol/L   Potassium 3.3 (L) 3.5 - 5.1 mmol/L   Chloride 103 98 - 111 mmol/L   BUN 8 8 - 23 mg/dL   Creatinine, Ser 0.90 0.44 - 1.00 mg/dL   Glucose, Bld 101 (H) 70 - 99 mg/dL    Comment: Glucose reference range applies only to samples taken after fasting for at least 8 hours.   Calcium, Ion 1.01 (L) 1.15 - 1.40 mmol/L   TCO2 23 22 - 32 mmol/L   Hemoglobin 12.2 12.0 - 15.0 g/dL   HCT 36.0 36.0 - 46.0 %  I-Stat arterial blood gas, ED     Status: Abnormal   Collection Time: 06/23/22  5:23 PM  Result Value Ref Range   pH, Arterial 7.416  7.35 - 7.45   pCO2 arterial 42.4 32 - 48 mmHg   pO2, Arterial 167 (H) 83 - 108 mmHg   Bicarbonate 27.3 20.0 - 28.0 mmol/L   TCO2 29 22 - 32 mmol/L   O2 Saturation 100 %   Acid-Base Excess 2.0 0.0 - 2.0 mmol/L   Sodium 136 135 - 145 mmol/L   Potassium 3.7 3.5 - 5.1 mmol/L   Calcium, Ion 1.10 (L) 1.15 - 1.40 mmol/L   HCT 37.0 36.0 - 46.0 %   Hemoglobin 12.6 12.0 - 15.0 g/dL   Patient temperature 98.1 F    Collection site RADIAL, ALLEN'S TEST ACCEPTABLE    Drawn by RT    Sample type ARTERIAL   Magnesium     Status: None   Collection Time: 06/23/22  5:25 PM  Result Value Ref Range   Magnesium 2.1 1.7 - 2.4 mg/dL    Comment: Performed at [facility], [place] [address], [place], [place] [address]  Acetaminophen level     Status: Abnormal   Collection Time: 06/23/22  5:25 PM  Result Value Ref Range   Acetaminophen (Tylenol), Serum <10 (L) 10 - 30 ug/mL    Comment: (NOTE) Therapeutic concentrations vary significantly. A range of 10-30 ug/mL  may be an effective  concentration for many patients. However, some  are best treated at concentrations outside of this range. Acetaminophen concentrations >150 ug/mL at 4 hours after ingestion  and >50 ug/mL at 12 hours after ingestion are often associated with  toxic reactions.  Performed at [facility], [place] [address], [place], [place] [address]   Salicylate level     Status: Abnormal   Collection Time: 06/23/22  5:25 PM  Result Value Ref Range   Salicylate Lvl <9.6 (L) 7.0 - 30.0 mg/dL    Comment: Performed at [facility] [address] [place], [place] [address]  Hepatic function panel     Status: None   Collection Time: 06/23/22  5:25 PM  Result Value Ref Range   Total Protein 7.6 6.5 - 8.1 g/dL   Albumin 4.1 3.5 - 5.0 g/dL   AST 36 15 - 41 U/L   ALT 21 0 - 44 U/L   Alkaline Phosphatase 52 38 - 126 U/L   Total Bilirubin 0.8 0.3 - 1.2 mg/dL   Bilirubin, Direct 0.2 0.0 - 0.2 mg/dL   Indirect Bilirubin 0.6 0.3 -  0.9 mg/dL    Comment: Performed at [facility] [address], [place], [place] [address]  Ethanol     Status: Abnormal   Collection Time: 06/23/22  5:25 PM  Result Value Ref Range   Alcohol, Ethyl (B) 233 (H) <10 mg/dL    Comment: (NOTE) Lowest detectable limit for serum alcohol is 10 mg/dL.  For medical purposes only. Performed at [facility], [place] [address], [place], [place] [address]   HIV Antibody (routine testing w rflx)     Status: None   Collection Time: 06/23/22  5:25 PM  Result Value Ref Range   HIV Screen 4th Generation wRfx Non Reactive Non Reactive    Comment: Performed at [facility], [place] [address], [place], [place] [address]  Rapid urine drug screen (hospital performed)     Status: Abnormal   Collection Time: 06/23/22  5:58 PM  Result Value Ref Range   Opiates NONE DETECTED NONE DETECTED   Cocaine NONE DETECTED NONE DETECTED   Benzodiazepines POSITIVE (A) NONE DETECTED   Amphetamines NONE DETECTED NONE DETECTED   Tetrahydrocannabinol NONE DETECTED NONE DETECTED   Barbiturates NONE DETECTED NONE DETECTED    Comment: (NOTE) DRUG SCREEN FOR MEDICAL PURPOSES ONLY.  IF CONFIRMATION IS NEEDED FOR ANY PURPOSE, NOTIFY LAB WITHIN 5 DAYS.  LOWEST DETECTABLE LIMITS FOR URINE DRUG SCREEN Drug Class                     Cutoff (ng/mL) Amphetamine and metabolites    1000 Barbiturate and metabolites    200 Benzodiazepine                 644 Tricyclics and metabolites     300 Opiates and metabolites        300 Cocaine and metabolites        300 THC                            50 Performed at [facility], [place] [address], [place], [place] [address]   Urinalysis, Routine w reflex microscopic Urine, Catheterized     Status: Abnormal   Collection Time: 06/23/22  5:58 PM  Result Value Ref Range   Color, Urine AMBER (A) YELLOW    Comment: BIOCHEMICALS  MAY BE AFFECTED BY COLOR   APPearance CLEAR CLEAR   Specific Gravity, Urine 1.006 1.005 - 1.030    pH 7.0 5.0 - 8.0   Glucose, UA NEGATIVE NEGATIVE mg/dL   Hgb urine dipstick MODERATE (A) NEGATIVE   Bilirubin Urine NEGATIVE NEGATIVE   Ketones, ur NEGATIVE NEGATIVE mg/dL   Protein, ur NEGATIVE NEGATIVE mg/dL   Nitrite POSITIVE (A) NEGATIVE   Leukocytes,Ua NEGATIVE NEGATIVE   RBC / HPF 0-5 0 - 5 RBC/hpf   WBC, UA 6-10 0 - 5 WBC/hpf   Bacteria, UA RARE (A) NONE SEEN   Mucus PRESENT     Comment: Performed at [facility], [place] [address], [place], [place] [address]  MRSA Next Gen by PCR, Nasal     Status: None   Collection Time: 06/23/22  6:31 PM   Specimen: Nasal Mucosa; Nasal Swab  Result Value Ref Range   MRSA by PCR Next Gen NOT DETECTED NOT DETECTED    Comment: (NOTE) The GeneXpert MRSA Assay (FDA approved for NASAL specimens only), is one component of a comprehensive MRSA colonization surveillance program. It is not intended to diagnose MRSA infection nor to guide or monitor treatment for MRSA infections. Test performance is not FDA approved in patients less than 81 years old. Performed at [facility], [place] [address] [place], [place] [address]   Glucose, capillary     Status: None   Collection Time: 06/23/22  7:41 PM  Result Value Ref Range   Glucose-Capillary 78 70 - 99 mg/dL    Comment: Glucose reference range applies only to samples taken after fasting for at least 8 hours.  Basic metabolic panel     Status: Abnormal   Collection Time: 06/23/22  9:13 PM  Result Value Ref Range   Sodium 139 135 - 145 mmol/L   Potassium 3.6 3.5 - 5.1 mmol/L   Chloride 106 98 - 111 mmol/L   CO2 23 22 - 32 mmol/L   Glucose, Bld 96 70 - 99 mg/dL    Comment: Glucose reference range applies only to samples taken after fasting for at least 8 hours.   BUN 7 (L) 8 - 23 mg/dL   Creatinine, Ser 0.65 0.44 - 1.00 mg/dL   Calcium 8.5 (L) 8.9 - 10.3 mg/dL   GFR, Estimated >60 >60 mL/min    Comment: (NOTE) Calculated using the CKD-EPI Creatinine Equation (2021)    Anion gap 10 5  - 15    Comment: Performed at [facility] [address], [place], [place] [address]  Glucose, capillary     Status: None   Collection Time: 06/23/22 11:23 PM  Result Value Ref Range   Glucose-Capillary 77 70 - 99 mg/dL    Comment: Glucose reference range applies only to samples taken after fasting for at least 8 hours.  CBC     Status: Abnormal   Collection Time: 06/24/22  3:05 AM  Result Value Ref Range   WBC 6.7 4.0 - 10.5 K/uL   RBC 3.49 (L) 3.87 - 5.11 MIL/uL   Hemoglobin 11.2 (L) 12.0 - 15.0 g/dL   HCT 32.8 (L) 36.0 - 46.0 %   MCV 94.0 80.0 - 100.0 fL   MCH 32.1 26.0 - 34.0 pg   MCHC 34.1 30.0 - 36.0 g/dL   RDW 12.9 11.5 - 15.5 %   Platelets 169 150 - 400 K/uL   nRBC 0.0 0.0 - 0.2 %    Comment: Performed at [facility], [place] [address], [place], [place] [address]  Basic metabolic panel     Status: Abnormal   Collection Time: 06/24/22  3:05 AM  Result Value Ref Range   Sodium 138 135 - 145 mmol/L   Potassium 3.6 3.5 - 5.1 mmol/L   Chloride 106 98 - 111 mmol/L   CO2 24 22 - 32 mmol/L   Glucose, Bld 96 70 - 99 mg/dL    Comment: Glucose reference range applies only to samples taken after fasting for at least 8 hours.   BUN 5 (L) 8 - 23 mg/dL   Creatinine, Ser 0.68 0.44 - 1.00 mg/dL   Calcium 8.3 (L) 8.9 - 10.3 mg/dL   GFR, Estimated >60 >60 mL/min    Comment: (NOTE) Calculated using the CKD-EPI Creatinine Equation (2021)    Anion gap 8 5 - 15    Comment: Performed at [facility] [address], [place], [place] [address]  Magnesium     Status: Abnormal   Collection Time: 06/24/22  3:05 AM  Result Value Ref Range   Magnesium 1.4 (L) 1.7 - 2.4 mg/dL    Comment: Performed at [facility] [address], [place], [place] [address]  Glucose, capillary     Status: None   Collection Time: 06/24/22  3:29 AM  Result Value Ref Range   Glucose-Capillary 98 70 - 99 mg/dL    Comment: Glucose reference range applies only to samples taken after fasting  for at least 8 hours.  I-STAT 7, (LYTES, BLD GAS, ICA, H+H)     Status: Abnormal   Collection Time: 06/24/22  5:19 AM  Result Value Ref Range   pH, Arterial 7.450 7.35 - 7.45   pCO2 arterial 40.0 32 - 48 mmHg   pO2, Arterial 131 (H) 83 - 108 mmHg   Bicarbonate 27.6 20.0 - 28.0 mmol/L   TCO2 29 22 - 32 mmol/L   O2 Saturation 99 %   Acid-Base Excess 4.0 (H) 0.0 - 2.0 mmol/L   Sodium 140 135 - 145 mmol/L   Potassium 3.5 3.5 - 5.1 mmol/L   Calcium, Ion 1.20 1.15 - 1.40 mmol/L   HCT 31.0 (L) 36.0 - 46.0 %   Hemoglobin 10.5 (L) 12.0 - 15.0 g/dL   Patient temperature 100.0 F    Sample type ARTERIAL   Glucose, capillary     Status: Abnormal   Collection Time: 06/24/22  7:50 AM  Result Value Ref Range   Glucose-Capillary 100 (H) 70 - 99 mg/dL    Comment: Glucose reference range applies only to samples taken after fasting for at least 8 hours.  Vitamin B12     Status: None   Collection Time: 06/24/22  1:01 PM  Result Value Ref Range   Vitamin B-12 354 180 - 914 pg/mL    Comment: (NOTE) This assay is not validated for testing neonatal or myeloproliferative syndrome specimens for Vitamin B12 levels. Performed at [facility], [place] [address], [place], [place] [address]   Folate     Status: None   Collection Time: 06/24/22  1:01 PM  Result Value Ref Range   Folate 13.8 >5.9 ng/mL    Comment: Performed at [facility] [address], [place], [place] [address]  Glucose, capillary     Status: None   Collection Time: 06/24/22  7:48 PM  Result Value Ref Range   Glucose-Capillary 96 70 - 99 mg/dL    Comment: Glucose reference range  applies only to samples taken after fasting for at least 8 hours.  Basic metabolic panel     Status: Abnormal   Collection Time: 06/25/22 12:24 AM  Result Value Ref Range   Sodium 136 135 - 145 mmol/L   Potassium 4.2 3.5 - 5.1 mmol/L   Chloride 106 98 - 111 mmol/L   CO2 21 (L) 22 - 32 mmol/L   Glucose, Bld 105 (H) 70 - 99 mg/dL    Comment:  Glucose reference range applies only to samples taken after fasting for at least 8 hours.   BUN 7 (L) 8 - 23 mg/dL   Creatinine, Ser 0.94 0.44 - 1.00 mg/dL   Calcium 8.1 (L) 8.9 - 10.3 mg/dL   GFR, Estimated >60 >60 mL/min    Comment: (NOTE) Calculated using the CKD-EPI Creatinine Equation (2021)    Anion gap 9 5 - 15    Comment: Performed at [facility] [address] [place], [place] [address]  Magnesium     Status: None   Collection Time: 06/25/22 12:24 AM  Result Value Ref Range   Magnesium 2.1 1.7 - 2.4 mg/dL    Comment: Performed at [facility], [place] [address] [place], [place] [address]  Phosphorus     Status: None   Collection Time: 06/25/22 12:24 AM  Result Value Ref Range   Phosphorus 3.0 2.5 - 4.6 mg/dL    Comment: Performed at [facility] [address], [place], [place] [address]  CBC with Differential/Platelet     Status: Abnormal   Collection Time: 06/25/22  2:21 AM  Result Value Ref Range   WBC 5.5 4.0 - 10.5 K/uL   RBC 3.42 (L) 3.87 - 5.11 MIL/uL   Hemoglobin 11.0 (L) 12.0 - 15.0 g/dL   HCT 32.7 (L) 36.0 - 46.0 %   MCV 95.6 80.0 - 100.0 fL   MCH 32.2 26.0 - 34.0 pg   MCHC 33.6 30.0 - 36.0 g/dL   RDW 13.2 11.5 - 15.5 %   Platelets 141 (L) 150 - 400 K/uL   nRBC 0.0 0.0 - 0.2 %   Neutrophils Relative % 65 %   Neutro Abs 3.5 1.7 - 7.7 K/uL   Lymphocytes Relative 21 %   Lymphs Abs 1.2 0.7 - 4.0 K/uL   Monocytes Relative 11 %   Monocytes Absolute 0.6 0.1 - 1.0 K/uL   Eosinophils Relative 3 %   Eosinophils Absolute 0.2 0.0 - 0.5 K/uL   Basophils Relative 0 %   Basophils Absolute 0.0 0.0 - 0.1 K/uL   Immature Granulocytes 0 %   Abs Immature Granulocytes 0.02 0.00 - 0.07 K/uL    Comment: Performed at [facility], [address] [place], [place] [address]    Current Facility-Administered Medications  Medication Dose Route Frequency Provider Last Rate Last Admin   0.9 %  sodium chloride infusion  250 mL Intravenous Continuous  Margaretha Seeds, MD       Ampicillin-Sulbactam (UNASYN) 3 g in sodium chloride 0.9 % 100 mL IVPB  3 g Intravenous Q6H Bertis Ruddy, RPH 200 mL/hr at 06/25/22 1300 Infusion Verify at 06/25/22 1300   Chlorhexidine Gluconate Cloth 2 % PADS 6 each  6 each Topical Daily Kipp Brood, MD   6 each at 06/24/22 2300   docusate (COLACE) 50 MG/5ML liquid 100 mg  100 mg Per Tube BID PRN Kipp Brood, MD       folic acid (FOLVITE) tablet 1  mg  1 mg Per Tube Daily Georgina Snell C, MD   1 mg at 06/24/22 1047   heparin injection 5,000 Units  5,000 Units Subcutaneous Q8H Desai, Rahul P, PA-C   5,000 Units at 06/25/22 0550   thiamine (VITAMIN B1) injection 100 mg  100 mg Intravenous Daily Kipp Brood, MD   100 mg at 06/24/22 1046    Musculoskeletal: Strength & Muscle Tone: within normal limits Gait & Station: normal Patient leans: N/A            Psychiatric Specialty Exam:  Presentation  General Appearance: Appropriate for Environment; Casual  Eye Contact:Good  Speech:Clear and Coherent; Slow  Speech Volume:Decreased  Handedness:Right   Mood and Affect  Mood:Anxious; Depressed  Affect:Appropriate; Congruent   Thought Process  Thought Processes:Linear; Coherent  Descriptions of Associations:Intact  Orientation:Full (Time, Place and Person)  Thought Content:Logical  History of Schizophrenia/Schizoaffective disorder:No  Duration of Psychotic Symptoms:No data recorded Hallucinations:Hallucinations: None  Ideas of Reference:None  Suicidal Thoughts:Suicidal Thoughts: Yes, Active SI Active Intent and/or Plan: With Intent; With Plan; With Means to [place]; With Access to Means  Homicidal Thoughts:Homicidal Thoughts: No   Sensorium  Memory:Immediate Good; Recent Poor; Remote Poor  Judgment:Impaired  Insight:Lacking   Executive Functions  Concentration:Fair  Attention Span:Fair  Recall:Poor  Fund of  Knowledge:Fair  Language:Fair   Psychomotor Activity  Psychomotor Activity:Psychomotor Activity: Normal   Assets  Assets:Communication Skills; Financial Resources/Insurance; Social Support; Transportation   Sleep  Sleep:Sleep: Fair   Physical Exam: Physical Exam ROS Blood pressure (!) 115/104, pulse 80, temperature 98.4 F (36.9 C), temperature source Oral, resp. rate 16, SpO2 100 %. There is no height or weight on file to calculate BMI.  Treatment Plan Summary: Daily contact with patient to assess and evaluate symptoms and progress in treatment, Medication management, and Plan recommend initiating CIWA protocol. -Patient is okay resuming Librium detox.  Recommend drawing B12, folate acid, thiamine level and replaced as appropriate. -Continue suicide sitter. -Patient continues to meet criteria for involuntary commitment. -We will refrain from resuming home medications at this time, due to risk for serotonin syndrome (unable to recall which medication she overdosed on).  -Psychiatry will continue to monitor and follow daily.  Disposition: Recommend psychiatric Inpatient admission when medically cleared.  Suella Broad, FNP 06/25/2022 1:20 PM

## 2022-06-25 NOTE — Progress Notes (Signed)
NAME:  Debra Rhodes, MRN:  [id number], DOB:  01/12/1961, LOS: 2 ADMISSION DATE:  06/23/2022, CONSULTATION DATE:  06/23/22 REFERRING MD:  Nechama Guard CHIEF COMPLAINT:  Overdose   History of Present Illness:  Debra Rhodes is a 61 y.o. female who has a PMH as below. She presented to [facility] ED 8/4 after being found unresponsive in her home by sponsor who went to check on her after pt had told sponsor and ex-boyfriend that she was over it and wanted to kill herself. EMS was called to the home and found pt minimally responsive. It was suspected that she had taken at least #50 '50mg'$  pills of Trazodone along with Ativan, Librium, and alcohol. She had been seen in ED 2 days prior for anxiety and wanting to get help for alcohol. She initially came in with SI; however, she denied any intention to EDP and stated that she was just very depressed and was wanting to quit drinking and smoking. She was discharged home on Librium.  In ED, she had GCS of around 5 and required intubation for airway protection. It was felt that she had likely aspirated prior to intubation. Post intubation, poison control was contacted and recommended checking APAP and salicylate levels along with maintaining K > 4 and Mg > 2. Thiamine and Folate were also recommended due to hx of EtOH dependence and abuse (per last ED visit notes, she had recently been drinking up to 3 bottles of wine per day).  Pertinent  Medical History:  has S/P right TKA; Overweight (BMI 25.0-29.9); Pain in right knee; Synovial cyst of knee; Alcohol abuse; MDD (major depressive disorder), recurrent severe, without psychosis ([place]); Suicidal ideation; Depression; Iron deficiency anemia; Mixed hyperlipidemia; History of alcohol abuse; Elevated serum hCG; Fatigue; Generalized anxiety disorder; Osteoarthritis of left knee; Osteoarthrosis of hand; Pain in joint of right shoulder; Deviated septum; Mixed conductive and sensorineural hearing loss of both ears; Nasal congestion; Nasal  turbinate hypertrophy; Neoplasm of uncertain behavior of skin; Rhinitis, chronic; Tinnitus aurium, bilateral; Acute non-recurrent frontal sinusitis; Chronic alcoholism in remission ([place]); Iron deficiency; Other specified behavioral and emotional disorders with onset usually occurring in childhood and adolescence; Primary insomnia; Raynaud's disease; Overdose; Acute respiratory failure ([place]); Hypokalemia; and Acute encephalopathy on their problem list.  Significant Hospital Events: Including procedures, antibiotic start and stop dates in addition to other pertinent events   8/4 admitted to ICU  Interim History / Subjective:  Remains sleepy all day yesterday and hence not extubated. This morning more awake   Objective:  Blood pressure 113/62, pulse 85, temperature 98.4 F (36.9 C), temperature source Oral, resp. rate 14, SpO2 98 %.    Vent Mode: PSV;CPAP FiO2 (%):  [21 %-40 %] 21 % Set Rate:  [16 bmp] 16 bmp Vt Set:  [420 mL] 420 mL PEEP:  [5 cmH20] 5 cmH20 Pressure Support:  [10 cmH20] 10 cmH20 Plateau Pressure:  [11 cmH20-14 cmH20] 11 cmH20   Intake/Output Summary (Last 24 hours) at 06/25/2022 1050 Last data filed at 06/25/2022 0600 Gross per 24 hour  Intake 539.52 ml  Output 300 ml  Net 239.52 ml    There were no vitals filed for this visit.  Examination: General: Adult female, resting in bed, in NAD. Neuro: awake, alert, follows commands, nonfocal HEENT: ETT in place. Cardiovascular: RRR, no M/R/G.  Lungs: No accessory muscle use, clear lungs Abdomen: BS x 4, soft, NT/ND.  Musculoskeletal: No gross deformities, no edema.  Skin: Intact, warm, no rashes.  Labs show normal  electrolytes, no leukocytosis, mild anemia  Labs/imaging personally reviewed:  CT head 8/4  neg CT cervical spine 8/4 multilevel spondylosis  Assessment & Plan:    Acute Respiratory failure with inability to protect the airway   -She passed continues breathing trial and was extubated to room  air  Presumed aspiration event Afebrile and no leukocytosis - Empiric unasyn, can DC tomorrow   Intentional Overdose  Hx Anxiety, Depression, EtOH dependence. - Hold home Chlordiazepoxide, Duloxetine, Gabapentin, Trazodone. - Thiamine, Folate. - Psychiatry consult once able to participate. - EtOH cessation counseling. - Suicide precautions. -IVC paperwork submitted   Best practice (evaluated daily):  Diet/type: Regular consistency (see orders) DVT prophylaxis: prophylactic heparin  GI prophylaxis: PPI Lines: N/A Foley:  N/A Code Status:  full code Last date of multidisciplinary goals of care discussion: NA  Labs   CBC: Recent Labs  Lab 06/21/22 2106 06/23/22 1514 06/23/22 1518 06/23/22 1723 06/24/22 0305 06/24/22 0519 06/25/22 0221  WBC 6.9 5.0  --   --  6.7  --  5.5  NEUTROABS 4.2  --   --   --   --   --  3.5  HGB 13.5 12.4 12.2 12.6 11.2* 10.5* 11.0*  HCT 39.5 36.8 36.0 37.0 32.8* 31.0* 32.7*  MCV 93.8 96.1  --   --  94.0  --  95.6  PLT 231 178  --   --  169  --  141*     Basic Metabolic Panel: Recent Labs  Lab 06/21/22 2106 06/23/22 1514 06/23/22 1518 06/23/22 1723 06/23/22 1725 06/23/22 2113 06/24/22 0305 06/24/22 0519 06/25/22 0024  NA 140 138 138 136  --  139 138 140 136  K 4.0 3.4* 3.3* 3.7  --  3.6 3.6 3.5 4.2  CL 105 104 103  --   --  106 106  --  106  CO2 24 23  --   --   --  23 24  --  21*  GLUCOSE 97 102* 101*  --   --  96 96  --  105*  BUN 8 7* 8  --   --  7* 5*  --  7*  CREATININE 0.57 0.71 0.90  --   --  0.65 0.68  --  0.94  CALCIUM 8.9 8.3*  --   --   --  8.5* 8.3*  --  8.1*  MG  --   --   --   --  2.1  --  1.4*  --  2.1  PHOS  --   --   --   --   --   --   --   --  3.0    GFR: Estimated Creatinine Clearance: 53.2 mL/min (by C-G formula based on SCr of 0.94 mg/dL). Recent Labs  Lab 06/21/22 2106 06/23/22 1514 06/24/22 0305 06/25/22 0221  WBC 6.9 5.0 6.7 5.5     Liver Function Tests: Recent Labs  Lab 06/21/22 2106  06/23/22 1725  AST 31 36  ALT 18 21  ALKPHOS 45 52  BILITOT 0.5 0.8  PROT 7.1 7.6  ALBUMIN 4.0 4.1    No results for input(s): "LIPASE", "AMYLASE" in the last 168 hours. No results for input(s): "AMMONIA" in the last 168 hours.  ABG    Component Value Date/Time   PHART 7.450 06/24/2022 0519   PCO2ART 40.0 06/24/2022 0519   PO2ART 131 (H) 06/24/2022 0519   HCO3 27.6 06/24/2022 0519   TCO2 29 06/24/2022 0519   O2SAT 99  06/24/2022 0519     Coagulation Profile: No results for input(s): "INR", "PROTIME" in the last 168 hours.  Cardiac Enzymes: No results for input(s): "CKTOTAL", "CKMB", "CKMBINDEX", "TROPONINI" in the last 168 hours.  HbA1C: Hgb A1c MFr Bld  Date/Time Value Ref Range Status  10/27/2021 06:19 AM 5.3 4.8 - 5.6 % Final    Comment:    (NOTE) Pre diabetes:          5.7%-6.4%  Diabetes:              >6.4%  Glycemic control for   <7.0% adults with diabetes   01/19/2021 06:22 AM 5.2 4.8 - 5.6 % Final    Comment:    (NOTE) Pre diabetes:          5.7%-6.4%  Diabetes:              >6.4%  Glycemic control for   <7.0% adults with diabetes     CBG: CBG (last 3)  Recent Labs    06/24/22 0329 06/24/22 0750 06/24/22 1948  GLUCAP 98 100* 96    CC time: 32 min  Kara Mead MD. FCCP. [facility] Pager : [phone]  If no response to pager , please call [phone] until 7 pm After 7:00 pm call Elink  [phone]   06/25/2022

## 2022-06-25 NOTE — Procedures (Signed)
Extubation Procedure Note  Patient Details:   Name: ESHA FINCHER DOB: [birth year] MRN: [id number]   Airway Documentation:    Vent end date: 06/26/22 Vent end time: 0811   Evaluation  O2 sats: stable throughout Complications: No apparent complications Patient did tolerate procedure well. Bilateral Breath Sounds: Clear, Diminished   Yes, pt could speak post extubation.  Pt extubated to room air.  Earney Navy 06/25/2022, 8:11 AM

## 2022-06-26 DIAGNOSIS — T50902D Poisoning by unspecified drugs, medicaments and biological substances, intentional self-harm, subsequent encounter: Secondary | ICD-10-CM

## 2022-06-26 DIAGNOSIS — J9600 Acute respiratory failure, unspecified whether with hypoxia or hypercapnia: Secondary | ICD-10-CM

## 2022-06-26 LAB — CBC
HCT: 35.5 % — ABNORMAL LOW (ref 36.0–46.0)
Hemoglobin: 11.9 g/dL — ABNORMAL LOW (ref 12.0–15.0)
MCH: 32.2 pg (ref 26.0–34.0)
MCHC: 33.5 g/dL (ref 30.0–36.0)
MCV: 96.2 fL (ref 80.0–100.0)
Platelets: 165 10*3/uL (ref 150–400)
RBC: 3.69 MIL/uL — ABNORMAL LOW (ref 3.87–5.11)
RDW: 12.3 % (ref 11.5–15.5)
WBC: 4.2 10*3/uL (ref 4.0–10.5)
nRBC: 0 % (ref 0.0–0.2)

## 2022-06-26 LAB — GLUCOSE, CAPILLARY: Glucose-Capillary: 91 mg/dL (ref 70–99)

## 2022-06-26 MED ORDER — FOLIC ACID 1 MG PO TABS
1.0000 mg | ORAL_TABLET | Freq: Every day | ORAL | Status: DC
  Administered 2022-06-26 – 2022-06-28 (×3): 1 mg via ORAL
  Filled 2022-06-26 (×3): qty 1

## 2022-06-26 MED ORDER — ACETAMINOPHEN 325 MG PO TABS
650.0000 mg | ORAL_TABLET | Freq: Once | ORAL | Status: AC | PRN
Start: 2022-06-26 — End: 2022-06-26
  Administered 2022-06-26: 650 mg via ORAL
  Filled 2022-06-26: qty 2

## 2022-06-26 MED ORDER — ACETAMINOPHEN 325 MG PO TABS: 650.0000 mg | ORAL_TABLET | Freq: Four times a day (QID) | ORAL | Status: DC | PRN

## 2022-06-26 MED ORDER — DOCUSATE SODIUM 50 MG/5ML PO LIQD: 100.0000 mg | Freq: Two times a day (BID) | ORAL | Status: DC | PRN

## 2022-06-26 MED ORDER — ORAL CARE MOUTH RINSE: 15.0000 mL | OROMUCOSAL | Status: DC | PRN

## 2022-06-26 NOTE — TOC Initial Note (Signed)
Transition of Care [facility]) - Initial/Assessment Note    Patient Details  Name: Debra Rhodes MRN: [id number] Date of Birth: [birth year]  [facility] CM/SW Contact:    Milinda Antis, [place] Phone Number: 06/26/2022, 2:36 PM  Clinical Narrative:                 CSW received consult for Inpatient Psych placement.  The patient and friends requested that CSW contact [facility] in [place], [place].  CSW observed that there will be a private pay fee for the patient as patient is uninsured.  CSW left a message for the facility, but per VM messages it will be several days before a returned call will be received  CSW contacted [facility] and was informed that there was not a bed available for the patient today.    CSW faxed referral to [facility] and is awaiting a response.   Expected Discharge Plan: Psychiatric Hospital Barriers to Discharge: Other (must enter comment) (awaiting a bed a Psych IP facility)   Patient Goals and CMS Choice Patient states their goals for this hospitalization and ongoing recovery are:: To receive help      Expected Discharge Plan and Services Expected Discharge Plan: Psychiatric Hospital In-house Referral: Clinical Social Work     Living arrangements for the past 2 months: Apartment                                      Prior Living Arrangements/Services Living arrangements for the past 2 months: Apartment Lives with:: Self   Do you feel safe going back to the place where you live?: Yes               Activities of Daily Living      Permission Sought/Granted                  Emotional Assessment Appearance:: Appears stated age Attitude/Demeanor/Rapport: Engaged Affect (typically observed): Apprehensive Orientation: : Oriented to Situation, Oriented to  Time, Oriented to Place Alcohol / Substance Use: Alcohol Use Psych Involvement: Yes (comment)  Admission diagnosis:  Overdose [T50.901A] Acute respiratory failure, unspecified  whether with hypoxia or hypercapnia ([place]) [J96.00] Intentional overdose, initial encounter ([place]) [T50.902A] Patient Active Problem List   Diagnosis Date Noted   Overdose 06/23/2022   Acute respiratory failure ([place])    Hypokalemia    Acute encephalopathy    Chronic alcoholism in remission ([place]) 02/22/2022   Iron deficiency 02/22/2022   Other specified behavioral and emotional disorders with onset usually occurring in childhood and adolescence 02/22/2022   Primary insomnia 02/22/2022   Raynaud's disease 02/22/2022   Acute non-recurrent frontal sinusitis 12/22/2021   Neoplasm of uncertain behavior of skin 11/01/2021   Generalized anxiety disorder 10/27/2021   Osteoarthritis of left knee 10/23/2021   Osteoarthrosis of hand 10/23/2021   Iron deficiency anemia 09/27/2021   Mixed hyperlipidemia 09/27/2021   History of alcohol abuse 09/27/2021   Elevated serum hCG 09/27/2021   Fatigue 09/27/2021   Nasal congestion 08/16/2021   Suicidal ideation    Depression    Alcohol abuse 01/21/2021   MDD (major depressive disorder), recurrent severe, without psychosis ([place]) 01/21/2021   Pain in joint of right shoulder 12/24/2019   Deviated septum 06/30/2019   Mixed conductive and sensorineural hearing loss of both ears 06/30/2019   Nasal turbinate hypertrophy 06/30/2019   Rhinitis, chronic 06/30/2019   Tinnitus aurium, bilateral  06/30/2019   Overweight (BMI 25.0-29.9) 05/14/2019   S/P right TKA 05/13/2019   Synovial cyst of knee 02/10/2019   Pain in right knee 02/04/2019   PCP:  Jeanie Sewer, NP Pharmacy:   [id number] - [address] [place] [address] Phone: [phone] Fax: [phone]     Social Determinants of Health (SDOH) Interventions    Readmission Risk Interventions     No data to display

## 2022-06-26 NOTE — Progress Notes (Signed)
[facility] Progress Note Patient Name: Debra Rhodes DOB: 20-Apr-1961 MRN: [id number]   Date of Service  06/26/2022  HPI/Events of Note  5/10 generalized body pain, s/p extubation from OD related encephalopathy yesterday.  LFT normal. Cr normal  eICU Interventions  Tylenol ordered once.      Intervention Category Intermediate Interventions: Pain - evaluation and management  Elmer Sow 06/26/2022, 3:54 AM

## 2022-06-26 NOTE — Progress Notes (Signed)
PROGRESS NOTE   CINDE EBERT  GYI:[id number]    DOB: [birth year]    DOA: 06/23/2022  PCP: Jeanie Sewer, NP   I have briefly reviewed patients previous medical records in [facility].  Chief Complaint  Patient presents with   Drug Overdose    Brief Narrative:  61 year old female, lives alone, independent, PMH of alcohol dependence, MDD, iron deficiency anemia, mixed HLD, generalized anxiety disorder, presented to the [facility] ED 06/23/2022 after being found unresponsive in her home by her sponsor who went to check on her after patient had told the sponsor and her ex-boyfriend that she was over it and wanted to kill herself.  EMS found her minimally responsive.  It was suspected that she had taken at least #50, 50 mg pills of trazodone along with Ativan, Librium and alcohol.  She had been seen in the ED 2 days prior for anxiety while she was waiting to get help for alcohol dependence, initially had come in with suicidal ideation but subsequently denied any intention to EDP and just stated that she was very depressed and wanting to quit drinking and smoking, she was discharged from ED on Librium.  In ED, she had GCS of around 5, underwent intubation for airway protection, suspected she had aspiration prior to intubation.  She was admitted to ICU.  Extubated 8/6.  Transferred from PCCM to [facility] on 8/7.   Assessment & Plan:  Principal Problem:   Overdose   Intentional overdose, suspected suicide attempt: Reportedly overdosed on undetermined amount of trazodone along with Ativan, Librium and alcohol.  Psychiatry input appreciated, recommend inpatient [facility] admission once medically cleared.  Patient reports generalized weakness, will get PT to evaluate and if cleared for home by PT then should be medically ready for DC to inpatient [facility].  Updated psychiatric team.  Continue safety sitter.  Currently home polypharmacy including chlordiazepoxide, duloxetine, gabapentin and trazodone are on hold.   Psychiatry concerned about serotonin syndrome and will defer to them regarding initiation of these.  Patient currently IVC but seems voluntarily agreeable to go to inpatient behavioral health Hospital.  Suicide precautions.  Acute respiratory failure with hypoxia: Due to inability to protect airway from AMS related to intentional drug overdose.  Extubated 8/7.  Wean off to room air as tolerated for oxygen saturations >92%.  Aggressive incentive spirometry.  Presumed aspiration pneumonia: Currently on IV Unasyn.  Continue same while hospitalized and transition to Augmentin at discharge to complete total 5 days course. Body mass index is 25.09 kg/m.  Alcohol dependence: Reports that she has been drinking anywhere between 1-2 bottles of wine per day for the last 3 months.  Cessation counseled.  Continue CIWA protocol, thiamine, folate and multivitamins.  Anxiety and depression: Defer management to psychiatry.  Generalized weakness: Multifactorial, likely due to hospitalization in the context of overdose, intubation and mechanical ventilation.  Mobilize with RN and PT consulted.  Anemia: Suspect chronic.  Stable.  Follow CBC  Thrombocytopenia: Likely related to alcohol toxicity.  No bleeding reported.  Follow-up CBCs.   DVT prophylaxis: heparin injection 5,000 Units Start: 06/23/22 1715 SCDs Start: 06/23/22 1706     Code Status: Full Code:  Family Communication: Daughter at bedside. Disposition:  Status is: Inpatient Remains inpatient appropriate because: IV antibiotics for aspiration pneumonia, safety sitter, suicide precautions.     Consultants:   PCCM Psychiatry  Procedures:   Intubation/extubation 8/6  Antimicrobials:   IV Unasyn   Subjective:  History as noted above.  Reports generalized weakness  and that she has not been out of bed except to the bedside commode.  Daughter expresses the same complaints.  Otherwise denies any other complaints.  Denies suicidal or  homicidal ideations.  Objective:   Vitals:   06/25/22 2100 06/25/22 2200 06/25/22 2243 06/26/22 0621  BP: 95/66 119/66 114/64 (!) 95/58  Pulse: 79 76 88 69  Resp: '11 15 18 15  '$ Temp:   98.7 F (37.1 C)   TempSrc:   Oral   SpO2: 97% 97% 96% 100%  Weight:    65.3 kg    General exam: Middle-age female, moderately built and nourished lying comfortably propped up in bed without distress. Respiratory system: Slightly diminished breath sounds in the bases with occasional basal crackles but otherwise clear to auscultation.  No increased work of breathing. Cardiovascular system: S1 & S2 heard, RRR. No JVD, murmurs, rubs, gallops or clicks. No pedal edema. Gastrointestinal system: Abdomen is nondistended, soft and nontender. No organomegaly or masses felt. Normal bowel sounds heard. Central nervous system: Alert and oriented. No focal neurological deficits. Extremities: Symmetric 5 x 5 power. Skin: No rashes, lesions or ulcers Psychiatry: Judgement and insight appear normal. Mood & affect flat.     Data Reviewed:   I have personally reviewed following labs and imaging studies   CBC: Recent Labs  Lab 06/21/22 2106 06/23/22 1514 06/23/22 1518 06/24/22 0305 06/24/22 0519 06/25/22 0221  WBC 6.9 5.0  --  6.7  --  5.5  NEUTROABS 4.2  --   --   --   --  3.5  HGB 13.5 12.4   < > 11.2* 10.5* 11.0*  HCT 39.5 36.8   < > 32.8* 31.0* 32.7*  MCV 93.8 96.1  --  94.0  --  95.6  PLT 231 178  --  169  --  141*   < > = values in this interval not displayed.    Basic Metabolic Panel: Recent Labs  Lab 06/21/22 2106 06/23/22 1514 06/23/22 1518 06/23/22 1723 06/23/22 1725 06/23/22 2113 06/24/22 0305 06/24/22 0519 06/25/22 0024  NA 140 138 138 136  --  139 138 140 136  K 4.0 3.4* 3.3* 3.7  --  3.6 3.6 3.5 4.2  CL 105 104 103  --   --  106 106  --  106  CO2 24 23  --   --   --  23 24  --  21*  GLUCOSE 97 102* 101*  --   --  96 96  --  105*  BUN 8 7* 8  --   --  7* 5*  --  7*   CREATININE 0.57 0.71 0.90  --   --  0.65 0.68  --  0.94  CALCIUM 8.9 8.3*  --   --   --  8.5* 8.3*  --  8.1*  MG  --   --   --   --  2.1  --  1.4*  --  2.1  PHOS  --   --   --   --   --   --   --   --  3.0    Liver Function Tests: Recent Labs  Lab 06/21/22 2106 06/23/22 1725  AST 31 36  ALT 18 21  ALKPHOS 45 52  BILITOT 0.5 0.8  PROT 7.1 7.6  ALBUMIN 4.0 4.1    CBG: Recent Labs  Lab 06/24/22 0750 06/24/22 1948 06/25/22 1938  GLUCAP 100* 96 110*    Microbiology Studies:  Recent Results (from the past 240 hour(s))  Resp Panel by RT-PCR (Flu A&B, Covid) Anterior Nasal Swab     Status: None   Collection Time: 06/21/22  9:06 PM   Specimen: Anterior Nasal Swab  Result Value Ref Range Status   SARS Coronavirus 2 by RT PCR NEGATIVE NEGATIVE Final    Comment: (NOTE) SARS-CoV-2 target nucleic acids are NOT DETECTED.  The SARS-CoV-2 RNA is generally detectable in upper respiratory specimens during the acute phase of infection. The lowest concentration of SARS-CoV-2 viral copies this assay can detect is 138 copies/mL. A negative result does not preclude SARS-Cov-2 infection and should not be used as the sole basis for treatment or other patient management decisions. A negative result may occur with  improper specimen collection/handling, submission of specimen other than nasopharyngeal swab, presence of viral mutation(s) within the areas targeted by this assay, and inadequate number of viral copies(<138 copies/mL). A negative result must be combined with clinical observations, patient history, and epidemiological information. The expected result is Negative.  Fact Sheet for Patients:  EntrepreneurPulse.com.au  Fact Sheet for Healthcare Providers:  IncredibleEmployment.be  This test is no t yet approved or cleared by the [place] FDA and  has been authorized for detection and/or diagnosis of SARS-CoV-2 by FDA under an Emergency  Use Authorization (EUA). This EUA will remain  in effect (meaning this test can be used) for the duration of the COVID-19 declaration under Section 564(b)(1) of the Act, 21 U.S.C.section 360bbb-3(b)(1), unless the authorization is terminated  or revoked sooner.       Influenza A by PCR NEGATIVE NEGATIVE Final   Influenza B by PCR NEGATIVE NEGATIVE Final    Comment: (NOTE) The Xpert Xpress SARS-CoV-2/FLU/RSV plus assay is intended as an aid in the diagnosis of influenza from Nasopharyngeal swab specimens and should not be used as a sole basis for treatment. Nasal washings and aspirates are unacceptable for Xpert Xpress SARS-CoV-2/FLU/RSV testing.  Fact Sheet for Patients: EntrepreneurPulse.com.au  Fact Sheet for Healthcare Providers: IncredibleEmployment.be  This test is not yet approved or cleared by the [place] FDA and has been authorized for detection and/or diagnosis of SARS-CoV-2 by FDA under an Emergency Use Authorization (EUA). This EUA will remain in effect (meaning this test can be used) for the duration of the COVID-19 declaration under Section 564(b)(1) of the Act, 21 U.S.C. section 360bbb-3(b)(1), unless the authorization is terminated or revoked.  Performed at [facility], [place] [address] [place], [place] [address]   MRSA Next Gen by PCR, Nasal     Status: None   Collection Time: 06/23/22  6:31 PM   Specimen: Nasal Mucosa; Nasal Swab  Result Value Ref Range Status   MRSA by PCR Next Gen NOT DETECTED NOT DETECTED Final    Comment: (NOTE) The GeneXpert MRSA Assay (FDA approved for NASAL specimens only), is one component of a comprehensive MRSA colonization surveillance program. It is not intended to diagnose MRSA infection nor to guide or monitor treatment for MRSA infections. Test performance is not FDA approved in patients less than 30 years old. Performed at [facility], [place] [address],  [place], [place] [address]     Radiology Studies:  No results found.  Scheduled Meds:    Chlorhexidine Gluconate Cloth  6 each Topical Daily   folic acid  1 mg Per Tube Daily   heparin  5,000 Units Subcutaneous Q8H   multivitamin with minerals  1 tablet Oral Daily   thiamine  100 mg Oral Daily   Or   thiamine  100 mg Intravenous Daily    Continuous Infusions:    sodium chloride     ampicillin-sulbactam (UNASYN) IV 3 g (06/26/22 0335)     LOS: 3 days     Vernell Leep, MD,  FACP, [facility], [facility], [facility] (Care Management Physician Certified) [place]  To contact the attending provider between 7A-7P or the covering provider during after hours 7P-7A, please log into the web site [email or web address] and access using universal [place] password for that web site. If you do not have the password, please call the hospital operator.  06/26/2022, 9:43 AM

## 2022-06-26 NOTE — Progress Notes (Signed)
Pharmacy Antibiotic Note  Debra Rhodes is a 61 y.o. female admitted on 06/23/2022 presenting found down, concern for aspiration.  Pharmacy consulted  on 8/4 for Unasyn dosing for aspiration PNA .  Afebrile.  WBC wnl.    8/4 MRSA pcr neg. MD notes plan is to continue IV abx inpt and transition  to augmentin  at discharge to complete total 5 day course.  Unasyn started 8/4 PM  Plan: Continue Unasyn 3g IV every 6 hours  x 5 days total,  end date 8/9  after 10:30 dose.  Monitor renal function, clinical progression .   Weight: 65.3 kg (143 lb 14.4 oz)  Temp (24hrs), Avg:98.8 F (37.1 C), Min:98.5 F (36.9 C), Max:99.3 F (37.4 C)  Recent Labs  Lab 06/21/22 2106 06/23/22 1514 06/23/22 1518 06/23/22 2113 06/24/22 0305 06/25/22 0024 06/25/22 0221 06/26/22 1030  WBC 6.9 5.0  --   --  6.7  --  5.5 4.2  CREATININE 0.57 0.71 0.90 0.65 0.68 0.94  --   --      Estimated Creatinine Clearance: 57.8 mL/min (by C-G formula based on SCr of 0.94 mg/dL).    Allergies  Allergen Reactions   Latex Shortness Of Breath, Swelling and Rash    Nicole Cella, RPh Clinical Pharmacist  06/26/2022 3:00 PM Please check AMION for all [place] phone numbers After 10:00 PM, call [place] [phone]

## 2022-06-26 NOTE — Evaluation (Signed)
Physical Therapy Evaluation Patient Details Name: Debra Rhodes MRN: [id number] DOB: 31-Mar-1961 Today's Date: 06/26/2022  History of Present Illness  Pt is a 61 y/o female admitted 8/4 secondary to overdose. Was intubated on 8/4 and extubated on 8/6. PMH includes alcohol dependence, MDD, anxiety, and alcohol abuse.  Clinical Impression  Pt admitted secondary to problem above with deficits below. Pt with increased tremors throughout and required min A for steadying. Otherwise tolerated mobility fairly well. Anticipate pt will progress well as tremors improve and will likely not require PT follow up. Plan is likely to go to inpatient psych facility upon d/c. Will continue to follow acutely to maximize functional mobility independence and safety.         Recommendations for follow up therapy are one component of a multi-disciplinary discharge planning process, led by the attending physician.  Recommendations may be updated based on patient status, additional functional criteria and insurance authorization.  Follow Up Recommendations No PT follow up (pending progression and improvement in symptoms)      Assistance Recommended at Discharge Frequent or constant Supervision/Assistance  Patient can return home with the following  A little help with walking and/or transfers;A little help with bathing/dressing/bathroom;Assistance with cooking/housework;Help with stairs or ramp for entrance;Assist for transportation    Equipment Recommendations None recommended by PT  Recommendations for Other Services       Functional Status Assessment Patient has had a recent decline in their functional status and demonstrates the ability to make significant improvements in function in a reasonable and predictable amount of time.     Precautions / Restrictions Precautions Precautions: Fall;Other (comment) Precaution Comments: suicide Restrictions Weight Bearing Restrictions: No      Mobility  Bed  Mobility Overal bed mobility: Needs Assistance Bed Mobility: Supine to Sit, Sit to Supine     Supine to sit: Supervision Sit to supine: Supervision   General bed mobility comments: Supervision for safety. Increased tremors noted upon sitting EOB and returning to supine.    Transfers Overall transfer level: Needs assistance Equipment used: 1 person hand held assist Transfers: Sit to/from Stand Sit to Stand: Min assist           General transfer comment: Min A for steadying secondary to increased tremors    Ambulation/Gait Ambulation/Gait assistance: Min assist Gait Distance (Feet): 75 Feet Assistive device: 1 person hand held assist Gait Pattern/deviations: Step-through pattern, Decreased stride length Gait velocity: Decreased     General Gait Details: Very shaky throughout. Min A for steadying.  Stairs            Wheelchair Mobility    Modified Rankin (Stroke Patients Only)       Balance Overall balance assessment: Needs assistance Sitting-balance support: No upper extremity supported, Feet supported Sitting balance-Leahy Scale: Fair     Standing balance support: Single extremity supported Standing balance-Leahy Scale: Poor Standing balance comment: Reliant on UE and external support                             Pertinent Vitals/Pain Pain Assessment Pain Assessment: Faces Faces Pain Scale: Hurts little more Pain Location: shoulders and legs Pain Descriptors / Indicators: Grimacing, Guarding Pain Intervention(s): Limited activity within patient's tolerance, Monitored during session, Repositioned    Home Living Family/patient expects to be discharged to:: Other (Comment) (inpatient psych)  Prior Function Prior Level of Function : Independent/Modified Independent             Mobility Comments: Very independent and likes to exercise       Hand Dominance        Extremity/Trunk Assessment    Upper Extremity Assessment Upper Extremity Assessment: Generalized weakness (increased tremors in BUE)    Lower Extremity Assessment Lower Extremity Assessment: Generalized weakness (tremors in BLE)    Cervical / Trunk Assessment Cervical / Trunk Assessment: Normal  Communication   Communication: No difficulties  Cognition Arousal/Alertness: Awake/alert Behavior During Therapy: WFL for tasks assessed/performed Overall Cognitive Status: No family/caregiver present to determine baseline cognitive functioning                                 General Comments: Seemed to be [facility] for basic tasks        General Comments      Exercises     Assessment/Plan    PT Assessment Patient needs continued PT services  PT Problem List Decreased activity tolerance;Decreased balance;Decreased mobility       PT Treatment Interventions Gait training;Functional mobility training;Therapeutic activities;Therapeutic exercise;Balance training;Patient/family education    PT Goals (Current goals can be found in the Care Plan section)  Acute Rehab PT Goals Patient Stated Goal: to be independent PT Goal Formulation: With patient Time For Goal Achievement: 07/10/22 Potential to Achieve Goals: Good    Frequency Min 3X/week     Co-evaluation               AM-PAC PT "6 Clicks" Mobility  Outcome Measure Help needed turning from your back to your side while in a flat bed without using bedrails?: None Help needed moving from lying on your back to sitting on the side of a flat bed without using bedrails?: A Little Help needed moving to and from a bed to a chair (including a wheelchair)?: A Little Help needed standing up from a chair using your arms (e.g., wheelchair or bedside chair)?: A Little Help needed to walk in hospital room?: A Little Help needed climbing 3-5 steps with a railing? : A Lot 6 Click Score: 18    End of Session Equipment Utilized During Treatment: Gait  belt Activity Tolerance: Patient tolerated treatment well Patient left: in bed;with call bell/phone within reach;with nursing/sitter in room Nurse Communication: Mobility status PT Visit Diagnosis: Unsteadiness on feet (R26.81);Difficulty in walking, not elsewhere classified (R26.2)    Time: 5409-8119 PT Time Calculation (min) (ACUTE ONLY): 18 min   Charges:   PT Evaluation $PT Eval Moderate Complexity: 1 Mod          Reuel Derby, PT, DPT  Acute Rehabilitation Services  Office: [phone]   [name] 06/26/2022, 11:46 AM

## 2022-06-27 ENCOUNTER — Other Ambulatory Visit: Payer: Self-pay

## 2022-06-27 ENCOUNTER — Encounter (HOSPITAL_COMMUNITY): Payer: Self-pay | Admitting: *Deleted

## 2022-06-27 DIAGNOSIS — T50902A Poisoning by unspecified drugs, medicaments and biological substances, intentional self-harm, initial encounter: Secondary | ICD-10-CM | POA: Diagnosis not present

## 2022-06-27 DIAGNOSIS — J690 Pneumonitis due to inhalation of food and vomit: Secondary | ICD-10-CM

## 2022-06-27 LAB — VITAMIN B1: Vitamin B1 (Thiamine): 194.8 nmol/L (ref 66.5–200.0)

## 2022-06-27 MED ORDER — AMOXICILLIN-POT CLAVULANATE 875-125 MG PO TABS
1.0000 | ORAL_TABLET | Freq: Two times a day (BID) | ORAL | Status: AC
  Administered 2022-06-27 (×2): 1 via ORAL
  Filled 2022-06-27 (×2): qty 1

## 2022-06-27 MED ORDER — DULOXETINE HCL 30 MG PO CPEP
30.0000 mg | ORAL_CAPSULE | Freq: Every day | ORAL | Status: DC
  Administered 2022-06-27 – 2022-06-28 (×2): 30 mg via ORAL
  Filled 2022-06-27 (×2): qty 1

## 2022-06-27 MED ORDER — DULOXETINE HCL 60 MG PO CPEP
60.0000 mg | ORAL_CAPSULE | Freq: Every day | ORAL | Status: DC
  Administered 2022-06-27 – 2022-06-28 (×2): 60 mg via ORAL
  Filled 2022-06-27 (×2): qty 1

## 2022-06-27 MED ORDER — GABAPENTIN 100 MG PO CAPS
200.0000 mg | ORAL_CAPSULE | Freq: Every day | ORAL | Status: DC
  Administered 2022-06-27: 200 mg via ORAL
  Filled 2022-06-27: qty 2

## 2022-06-27 MED ORDER — TRAZODONE HCL 50 MG PO TABS
50.0000 mg | ORAL_TABLET | Freq: Every evening | ORAL | Status: DC | PRN
  Administered 2022-06-27: 50 mg via ORAL
  Filled 2022-06-27: qty 1

## 2022-06-27 MED ORDER — GABAPENTIN 100 MG PO CAPS
100.0000 mg | ORAL_CAPSULE | Freq: Two times a day (BID) | ORAL | Status: DC
  Administered 2022-06-27 – 2022-06-28 (×3): 100 mg via ORAL
  Filled 2022-06-27 (×3): qty 1

## 2022-06-27 NOTE — TOC Progression Note (Addendum)
Transition of Care [facility]) - Initial/Assessment Note    Patient Details  Name: Debra Rhodes MRN: [id number] Date of Birth: 09-09-1961  Transition of Care [facility]) CM/SW Contact:    Milinda Antis, LCSWA Phone Number: 06/27/2022, 9:07 AM  Clinical Narrative:                 CSW contacted [facility] to check on the referral made yesterday.  CSW was informed that the facility did not receive the referral and that the facility does not have any beds available at this time, but will tomorrow.  CSW resent referral.    CSW contacted [facility] to inquire about bed availability today and is awaiting a response.    CSW informed that [facility] and [facility] do not have beds available today.    Referral sent to: [facility]- No bed today [place]- VM left to inquire about referral [facility]- still under review [facility]- Full, not accepting new referrals  Expected Discharge Plan: Psychiatric Hospital Barriers to Discharge: Other (must enter comment) (awaiting a bed a Psych IP facility)   Patient Goals and CMS Choice Patient states their goals for this hospitalization and ongoing recovery are:: To receive help      Expected Discharge Plan and Services Expected Discharge Plan: Psychiatric Hospital In-house Referral: Clinical Social Work     Living arrangements for the past 2 months: Apartment                                      Prior Living Arrangements/Services Living arrangements for the past 2 months: Apartment Lives with:: Self   Do you feel safe going back to the place where you live?: Yes               Activities of Daily Living Home Assistive Devices/Equipment: None ADL Screening (condition at time of admission) Patient's cognitive ability adequate to safely complete daily activities?: Yes Is the patient deaf or have difficulty hearing?: Yes Does the patient have difficulty seeing, even when wearing glasses/contacts?: Yes Does the patient have difficulty  concentrating, remembering, or making decisions?: Yes Patient able to express need for assistance with ADLs?: Yes Does the patient have difficulty dressing or bathing?: No Independently performs ADLs?: Yes (appropriate for developmental age) Does the patient have difficulty walking or climbing stairs?: Yes Weakness of Legs: Both Weakness of Arms/Hands: None  Permission Sought/Granted                  Emotional Assessment Appearance:: Appears stated age Attitude/Demeanor/Rapport: Engaged Affect (typically observed): Apprehensive Orientation: : Oriented to Situation, Oriented to  Time, Oriented to Place Alcohol / Substance Use: Alcohol Use Psych Involvement: Yes (comment)  Admission diagnosis:  Overdose [T50.901A] Acute respiratory failure, unspecified whether with hypoxia or hypercapnia ([place]) [J96.00] Intentional overdose, initial encounter ([place]) [T50.902A] Patient Active Problem List   Diagnosis Date Noted   Overdose 06/23/2022   Acute respiratory failure ([place])    Hypokalemia    Acute encephalopathy    Chronic alcoholism in remission ([place]) 02/22/2022   Iron deficiency 02/22/2022   Other specified behavioral and emotional disorders with onset usually occurring in childhood and adolescence 02/22/2022   Primary insomnia 02/22/2022   Raynaud's disease 02/22/2022   Acute non-recurrent frontal sinusitis 12/22/2021   Neoplasm of uncertain behavior of skin 11/01/2021   Generalized anxiety disorder 10/27/2021   Osteoarthritis of left knee 10/23/2021   Osteoarthrosis  of hand 10/23/2021   Iron deficiency anemia 09/27/2021   Mixed hyperlipidemia 09/27/2021   History of alcohol abuse 09/27/2021   Elevated serum hCG 09/27/2021   Fatigue 09/27/2021   Nasal congestion 08/16/2021   Suicidal ideation    Depression    Alcohol abuse 01/21/2021   MDD (major depressive disorder), recurrent severe, without psychosis ([place]) 01/21/2021   Pain in joint of right shoulder 12/24/2019    Deviated septum 06/30/2019   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

## 2022-06-27 NOTE — Progress Notes (Signed)
PROGRESS NOTE   Debra Rhodes  VXB:[id number]    DOB: 1961-05-19    DOA: 06/23/2022  PCP: Jeanie Sewer, NP   I have briefly reviewed patients previous medical records in [facility].  Chief Complaint  Patient presents with   Drug Overdose    Brief Narrative:  61 year old female, lives alone, independent, PMH of alcohol dependence, MDD, iron deficiency anemia, mixed HLD, generalized anxiety disorder, presented to the [facility] ED 06/23/2022 after being found unresponsive in her home by her sponsor who went to check on her after patient had told the sponsor and her ex-boyfriend that she was over it and wanted to kill herself.  EMS found her minimally responsive.  It was suspected that she had taken at least #50, 50 mg pills of trazodone along with Ativan, Librium and alcohol.  She had been seen in the ED 2 days prior for anxiety while she was waiting to get help for alcohol dependence, initially had come in with suicidal ideation but subsequently denied any intention to EDP and just stated that she was very depressed and wanting to quit drinking and smoking, she was discharged from ED on Librium.  In ED, she had GCS of around 5, underwent intubation for airway protection, suspected she had aspiration prior to intubation.  She was admitted to ICU.  Extubated 8/6.  Transferred from PCCM to [facility] on 8/7.  Medically optimized for DC to inpatient psychiatry when a bed is available.   Assessment & Plan:  Principal Problem:   Overdose   Intentional overdose, suspected suicide attempt: Reportedly overdosed on undetermined amount of trazodone along with Ativan, Librium and alcohol.  Psychiatry input appreciated, recommend inpatient [facility] admission once medically cleared.  Patient is now medically optimized for DC to inpatient psychiatry pending bed availability.  This has been communicated with psychiatry team.  PT has evaluated and recommend no follow-up. Continue Air cabin crew.  Initially home  polypharmacy including chlordiazepoxide, duloxetine, gabapentin and trazodone were held.  Psychiatry concerned about serotonin syndrome and will defer to them regarding initiation of these.  Patient currently IVC but seems voluntarily agreeable to go to inpatient behavioral health Hospital.  Suicide precautions.  Psychiatry follow-up appreciated, remains appropriate for inpatient psychiatric, the have resumed home Cymbalta, trazodone and gabapentin.  As per TOC, no bed availability thus far.  Acute respiratory failure with hypoxia: Due to inability to protect airway from AMS related to intentional drug overdose.  Extubated 8/7.  Aggressive incentive spirometry.  Weaned off to room air.  Hypoxia resolved.  Presumed aspiration pneumonia: Completed 4 days of IV Unasyn, changed to Augmentin and will complete total 5-day course on 8/8.  Recommend follow-up chest x-ray in 4 weeks to ensure resolution of pneumonia findings.  Body mass index is 25.09 kg/m.  Alcohol dependence: Reports that she has been drinking anywhere between 1-2 bottles of wine per day for the last 3 months.  Cessation counseled.  Continue CIWA protocol, thiamine, folate and multivitamins.  Some tremors.  Mostly low CIWA scores.  Anxiety and depression: Defer management to psychiatry.  Resumed meds as noted above.  Generalized weakness: Multifactorial, likely due to hospitalization in the context of overdose, intubation and mechanical ventilation.  PT recommends no outpatient follow-up.  Anemia: Suspect chronic.  Stable.  Thrombocytopenia: Likely related to alcohol toxicity.  No bleeding reported.  Resolved.   DVT prophylaxis: heparin injection 5,000 Units Start: 06/23/22 1715 SCDs Start: 06/23/22 1706     Code Status: Full Code:  Family Communication: Daughter  at bedside on 8/8. Disposition:  Status is: Inpatient Patient is medically optimized for DC to next level of care i.e. inpatient psychiatry pending bed  availability which is not available at this time.     Consultants:   PCCM Psychiatry  Procedures:   Intubation/extubation 8/6  Antimicrobials:   IV Unasyn   Subjective:  When seen this morning, reporting some heaviness/fogginess of head and feels like she needs to restart her Cymbalta.  Otherwise denied complaints.  Objective:   Vitals:   06/27/22 0349 06/27/22 0617 06/27/22 0858 06/27/22 1023  BP:  139/87 (!) 145/89 (!) 152/98  Pulse:  85 88 94  Resp:  '18 18 20  '$ Temp:  98.5 F (36.9 C) 98.3 F (36.8 C) 97.9 F (36.6 C)  TempSrc:  Oral Oral Oral  SpO2:  97% 100% 100%  Weight:      Height: 5' 3.5" (1.613 m)       General exam: Middle-age female, moderately built and nourished lying comfortably propped up in bed without distress.  Appears in good spirits.  Subsequently seen ambulating steadily, unassisted in the halls with PT and her daughter supervision Respiratory system: Clear to auscultation.  No increased work of breathing. Cardiovascular system: S1 & S2 heard, RRR. No JVD, murmurs, rubs, gallops or clicks. No pedal edema.  Telemetry personally reviewed: Sinus rhythm. Gastrointestinal system: Abdomen is nondistended, soft and nontender. No organomegaly or masses felt. Normal bowel sounds heard. Central nervous system: Alert and oriented. No focal neurological deficits. Extremities: Symmetric 5 x 5 power. Skin: No rashes, lesions or ulcers Psychiatry: Judgement and insight appear normal. Mood & affect pleasant and appropriate.    Data Reviewed:   I have personally reviewed following labs and imaging studies   CBC: Recent Labs  Lab 06/21/22 2106 06/23/22 1514 06/24/22 0305 06/24/22 0519 06/25/22 0221 06/26/22 1030  WBC 6.9   < > 6.7  --  5.5 4.2  NEUTROABS 4.2  --   --   --  3.5  --   HGB 13.5   < > 11.2* 10.5* 11.0* 11.9*  HCT 39.5   < > 32.8* 31.0* 32.7* 35.5*  MCV 93.8   < > 94.0  --  95.6 96.2  PLT 231   < > 169  --  141* 165   < > = values in  this interval not displayed.    Basic Metabolic Panel: Recent Labs  Lab 06/21/22 2106 06/23/22 1514 06/23/22 1518 06/23/22 1723 06/23/22 1725 06/23/22 2113 06/24/22 0305 06/24/22 0519 06/25/22 0024  NA 140 138 138 136  --  139 138 140 136  K 4.0 3.4* 3.3* 3.7  --  3.6 3.6 3.5 4.2  CL 105 104 103  --   --  106 106  --  106  CO2 24 23  --   --   --  23 24  --  21*  GLUCOSE 97 102* 101*  --   --  96 96  --  105*  BUN 8 7* 8  --   --  7* 5*  --  7*  CREATININE 0.57 0.71 0.90  --   --  0.65 0.68  --  0.94  CALCIUM 8.9 8.3*  --   --   --  8.5* 8.3*  --  8.1*  MG  --   --   --   --  2.1  --  1.4*  --  2.1  PHOS  --   --   --   --   --   --   --   --  3.0    Liver Function Tests: Recent Labs  Lab 06/21/22 2106 06/23/22 1725  AST 31 36  ALT 18 21  ALKPHOS 45 52  BILITOT 0.5 0.8  PROT 7.1 7.6  ALBUMIN 4.0 4.1    CBG: Recent Labs  Lab 06/24/22 0750 06/24/22 1948 06/25/22 1938  GLUCAP 100* 96 110*    Microbiology Studies:   Recent Results (from the past 240 hour(s))  Resp Panel by RT-PCR (Flu A&B, Covid) Anterior Nasal Swab     Status: None   Collection Time: 06/21/22  9:06 PM   Specimen: Anterior Nasal Swab  Result Value Ref Range Status   SARS Coronavirus 2 by RT PCR NEGATIVE NEGATIVE Final    Comment: (NOTE) SARS-CoV-2 target nucleic acids are NOT DETECTED.  The SARS-CoV-2 RNA is generally detectable in upper respiratory specimens during the acute phase of infection. The lowest concentration of SARS-CoV-2 viral copies this assay can detect is 138 copies/mL. A negative result does not preclude SARS-Cov-2 infection and should not be used as the sole basis for treatment or other patient management decisions. A negative result may occur with  improper specimen collection/handling, submission of specimen other than nasopharyngeal swab, presence of viral mutation(s) within the areas targeted by this assay, and inadequate number of viral copies(<138 copies/mL). A  negative result must be combined with clinical observations, patient history, and epidemiological information. The expected result is Negative.  Fact Sheet for Patients:  EntrepreneurPulse.com.au  Fact Sheet for Healthcare Providers:  IncredibleEmployment.be  This test is no t yet approved or cleared by the [place] FDA and  has been authorized for detection and/or diagnosis of SARS-CoV-2 by FDA under an Emergency Use Authorization (EUA). This EUA will remain  in effect (meaning this test can be used) for the duration of the COVID-19 declaration under Section 564(b)(1) of the Act, 21 U.S.C.section 360bbb-3(b)(1), unless the authorization is terminated  or revoked sooner.       Influenza A by PCR NEGATIVE NEGATIVE Final   Influenza B by PCR NEGATIVE NEGATIVE Final    Comment: (NOTE) The Xpert Xpress SARS-CoV-2/FLU/RSV plus assay is intended as an aid in the diagnosis of influenza from Nasopharyngeal swab specimens and should not be used as a sole basis for treatment. Nasal washings and aspirates are unacceptable for Xpert Xpress SARS-CoV-2/FLU/RSV testing.  Fact Sheet for Patients: EntrepreneurPulse.com.au  Fact Sheet for Healthcare Providers: IncredibleEmployment.be  This test is not yet approved or cleared by the [place] FDA and has been authorized for detection and/or diagnosis of SARS-CoV-2 by FDA under an Emergency Use Authorization (EUA). This EUA will remain in effect (meaning this test can be used) for the duration of the COVID-19 declaration under Section 564(b)(1) of the Act, 21 U.S.C. section 360bbb-3(b)(1), unless the authorization is terminated or revoked.  Performed at [facility], [place] [address], [place], [place] [address]   MRSA Next Gen by PCR, Nasal     Status: None   Collection Time: 06/23/22  6:31 PM   Specimen: Nasal Mucosa; Nasal Swab  Result Value  Ref Range Status   MRSA by PCR Next Gen NOT DETECTED NOT DETECTED Final    Comment: (NOTE) The GeneXpert MRSA Assay (FDA approved for NASAL specimens only), is one component of a comprehensive MRSA colonization surveillance program. It is not intended to diagnose MRSA infection nor to guide or monitor treatment for MRSA infections. Test performance is not FDA approved in patients less than 79 years old. Performed at [facility], [place] [address] [place], [place] [address]     Radiology Studies:  No results found.  Scheduled Meds:    amoxicillin-clavulanate  1 tablet Oral Q12H   DULoxetine  30 mg Oral Daily   DULoxetine  60 mg Oral Daily   folic acid  1 mg Oral Daily   gabapentin  100 mg Oral BID WC   gabapentin  200 mg Oral QHS   heparin  5,000 Units Subcutaneous Q8H   multivitamin with minerals  1 tablet Oral Daily   thiamine  100 mg Oral Daily   Or   thiamine  100 mg Intravenous Daily    Continuous Infusions:    sodium chloride       LOS: 4 days     Vernell Leep, MD,  FACP, [facility], [facility], [facility] (Care Management Physician Certified) [place]  To contact the attending provider between 7A-7P or the covering provider during after hours 7P-7A, please log into the web site [email or web address] and access using universal [place] password for that web site. If you do not have the password, please call the hospital operator.  06/27/2022, 3:53 PM

## 2022-06-27 NOTE — Progress Notes (Signed)
Physical Therapy Treatment Patient Details Name: Debra Rhodes MRN: [id number] DOB: April 29, 1961 Today's Date: 06/27/2022   History of Present Illness Pt is a 61 y/o female admitted 8/4 secondary to overdose. Was intubated on 8/4 and extubated on 8/6. PMH includes alcohol dependence, MDD, anxiety, and alcohol abuse.    PT Comments    Pt progressing well towards goals. Pt with improvement in tremors and balance this session. Overall supervision for safety. Reviewed HEP for LE strengthening. Current recommendations appropriate. Will continue to follow acutely.     Recommendations for follow up therapy are one component of a multi-disciplinary discharge planning process, led by the attending physician.  Recommendations may be updated based on patient status, additional functional criteria and insurance authorization.  Follow Up Recommendations  No PT follow up     Assistance Recommended at Discharge Frequent or constant Supervision/Assistance  Patient can return home with the following A little help with walking and/or transfers;A little help with bathing/dressing/bathroom;Assistance with cooking/housework;Help with stairs or ramp for entrance;Assist for transportation   Equipment Recommendations  None recommended by PT    Recommendations for Other Services       Precautions / Restrictions Precautions Precautions: Fall;Other (comment) Precaution Comments: suicide Restrictions Weight Bearing Restrictions: No     Mobility  Bed Mobility Overal bed mobility: Needs Assistance Bed Mobility: Supine to Sit, Sit to Supine     Supine to sit: Supervision Sit to supine: Supervision   General bed mobility comments: Supervision for safety. tremors improved from previous session    Transfers Overall transfer level: Needs assistance Equipment used: None Transfers: Sit to/from Stand Sit to Stand: Supervision           General transfer comment: Supervision for safety. Notable  improvement with tremors this session    Ambulation/Gait Ambulation/Gait assistance: Supervision Gait Distance (Feet): 150 Feet Assistive device: None Gait Pattern/deviations: Step-through pattern, Decreased stride length Gait velocity: Decreased     General Gait Details: Improved balance noted this session. Pt reports some weakness, but tolerated mobility well. Supervision for safety.   Stairs             Wheelchair Mobility    Modified Rankin (Stroke Patients Only)       Balance Overall balance assessment: Needs assistance Sitting-balance support: No upper extremity supported, Feet supported Sitting balance-Leahy Scale: Fair     Standing balance support: Single extremity supported Standing balance-Leahy Scale: Fair                              Cognition Arousal/Alertness: Awake/alert Behavior During Therapy: WFL for tasks assessed/performed Overall Cognitive Status: Within Functional Limits for tasks assessed                                          Exercises General Exercises - Lower Extremity Ankle Circles/Pumps: AROM, Both, 10 reps Heel Slides: AROM, Both, 10 reps, Supine Other Exercises Other Exercises: Sit<>stand X5 for LE strengthing. Some tremors noted with repetitive movements    General Comments        Pertinent Vitals/Pain Pain Assessment Pain Assessment: No/denies pain    Home Living                          Prior Function  PT Goals (current goals can now be found in the care plan section) Acute Rehab PT Goals Patient Stated Goal: to be independent PT Goal Formulation: With patient Time For Goal Achievement: 07/10/22 Potential to Achieve Goals: Good Progress towards PT goals: Progressing toward goals    Frequency    Min 3X/week      PT Plan Current plan remains appropriate    Co-evaluation              AM-PAC PT "6 Clicks" Mobility   Outcome Measure  Help  needed turning from your back to your side while in a flat bed without using bedrails?: None Help needed moving from lying on your back to sitting on the side of a flat bed without using bedrails?: None Help needed moving to and from a bed to a chair (including a wheelchair)?: A Little Help needed standing up from a chair using your arms (e.g., wheelchair or bedside chair)?: A Little Help needed to walk in hospital room?: A Little Help needed climbing 3-5 steps with a railing? : A Little 6 Click Score: 20    End of Session Equipment Utilized During Treatment: Gait belt Activity Tolerance: Patient tolerated treatment well Patient left: in bed;with call bell/phone within reach;with nursing/sitter in room Nurse Communication: Mobility status PT Visit Diagnosis: Unsteadiness on feet (R26.81);Difficulty in walking, not elsewhere classified (R26.2)     Time: 1443-1540 PT Time Calculation (min) (ACUTE ONLY): 21 min  Charges:  $Gait Training: 8-22 mins                     Debra Rhodes, PT, DPT  Acute Rehabilitation Services  Office: [phone]    Debra Rhodes 06/27/2022, 1:03 PM

## 2022-06-27 NOTE — Consult Note (Signed)
[place] [facility] Consult   Reason for Consult: Suicide attempt by overdose Referring Physician: Dr. Elsworth Soho Patient Identification: Debra Rhodes MRN:  [id number] Principal Diagnosis: Overdose Diagnosis:  Principal Problem:   Overdose   Total Time spent with patient: 1 hour  Subjective:   Debra Rhodes is a 61 y.o. female patient admitted with suicide attempt by overdose on trazodone, Xanax, and alcohol.  Patient previous psychiatric history of anxiety disorder, major depressive disorder, ADHD inattentive type who presented from home after a suicide attempt.   Of note patient with suicide attempt by overdose in the setting of alcohol relapse.  She currently receives outpatient services at [facility] psychiatric, under the care of Thayer Headings.  Her last outpatient appointment was July 11 in which she was stable at that time.  She also presented to [facility] emergency department 2 days prior to her suicide attempt, blood alcohol level at that time of 250.  Patient offered Librium detox and therapeutic triage assessment in which she declined and stated she was ready to go home.  She reports that in the context of her relapse she plan to commit suicide by overdose.  Patient tells me she attempted suicide once before 5 years ago.  On chart review patient has had multiple suicide attempts, most recent attempt being in August 2022 in which she had taken pills and was found by alcohol from someone in her [place] group who reached out to patient for assistance. On evaluation today, patient is observed to be sitting upright in bedside chair.  She does engage well with this psychiatric nurse practitioner.  She also shows much interest in participating in her treatment plan.  Patient concerned about resuming her home medication, particularly her Cymbalta to prevent withdrawal.  Discussed with patient decision to hold psychotropic medication until mental fogginess disappeared as we were unaware of which  medications she took in her suicide attempt.  She is able to verbally express taking Xanax, Librium, and trazodone.  She denies taking any additional psychotropic medication at that time.  Patient has been able to control her mood, anger, irritability during his stay.  She denies any current urges and cravings for alcohol; however she does present with tremors.  While she denies any current suicidal ideation, she does ongoing feelings of depression and anxiety.  Patient furthermore does not appear to be responding to internal stimuli, external stimuli, and or exhibiting delusional thought disorder.  Patient denies homicidal ideations, psychosis, hallucinations.  We did agree to resume home medication at this time.  She continues to meet inpatient psychiatric criteria.  HPI: See above  Past Psychiatric History:  From a previous admission 8/22: "Patient has a longstanding history of depression as well as alcohol dependence.  She has been to detox and rehab in the past.  She stated the last time she was in rehab was approximately 5 years ago.  She has had success with sobriety for an extended period of time with [facility].  She has been in treatment for depression with a nurse practitioner locally.  She is currently receiving outpatient services at [facility] psychiatric with Thayer Headings.  She is currently being managed with trazodone, duloxetine, and gabapentin.  She is unable to recall if she was compliant with this medication.  She has been on paroxetine for depression.  Previously we will attempt to switch her to sertraline, but she did worse with that.  She was admitted to [facility] in March and August 2022.   -She reports  history of 1 previous suicide attempt 5 years ago, by overdose which she did not require medical admission however was admitted to inpatient rehab.  Risk to Self:  Yes Risk to Others:   Denies Prior Inpatient Therapy:   Multiple times Prior Outpatient Therapy:   [facility]  Past Medical History:  Past Medical History:  Diagnosis Date   Anxiety    Depression    PONV (postoperative nausea and vomiting)     Past Surgical History:  Procedure Laterality Date   AUGMENTATION MAMMAPLASTY Bilateral 1996   BACK SURGERY     CESAREAN SECTION     FRACTURE SURGERY     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   Procedure: TOTAL KNEE ARTHROPLASTY;  Surgeon: Paralee Cancel, MD;  Location: [facility] ORS;  Service: Orthopedics;  Laterality: Right;  70 mins   Family History:  Family History  Problem Relation Age of Onset   Anxiety disorder Sister    Depression Brother    Schizophrenia Brother    Suicidality Brother    Anxiety disorder Brother    Alcohol abuse Sister    Alcohol abuse Brother    Alcohol abuse Brother    Family Psychiatric  History: Brother completed suicide, also struggled with anxiety and substance use issues.  Paternal uncle suicide completion.  Father history of substance issues.  Significant family history on maternal side for substance use.  Daughters are diagnosed with anxiety and depression.  Social History:  Social History   Substance and Sexual Activity  Alcohol Use Yes   Comment: binge drinking- was in recovery for past 4 years     Social History   Substance and Sexual Activity  Drug Use No    Social History   Socioeconomic History   Marital status: Divorced    Spouse name: Not on file   Number of children: Not on file   Years of education: Not on file   Highest education level: Not on file  Occupational History   Not on file  Tobacco Use   Smoking status: Never   Smokeless tobacco: Never  Vaping Use   Vaping Use: Never used  Substance and Sexual Activity   Alcohol use: Yes    Comment: binge drinking- was in recovery for past 4 years   Drug use: No   Sexual activity: Not Currently  Other Topics Concern   Not on file  Social History Narrative   Not on file   Social Determinants of Health   Financial Resource  Strain: Not on file  Food Insecurity: Not on file  Transportation Needs: Not on file  Physical Activity: Not on file  Stress: Not on file  Social Connections: Not on file   Additional Social History:    Allergies:   Allergies  Allergen Reactions   Latex Shortness Of Breath, Swelling and Rash    Labs:  Results for orders placed or performed during the hospital encounter of 06/23/22 (from the past 48 hour(s))  Glucose, capillary     Status: Abnormal   Collection Time: 06/25/22  7:38 PM  Result Value Ref Range   Glucose-Capillary 110 (H) 70 - 99 mg/dL    Comment: Glucose reference range applies only to samples taken after fasting for at least 8 hours.  CBC     Status: Abnormal   Collection Time: 06/26/22 10:30 AM  Result Value Ref Range   WBC 4.2 4.0 - 10.5 K/uL   RBC 3.69 (L) 3.87 - 5.11 MIL/uL   Hemoglobin 11.9 (  L) 12.0 - 15.0 g/dL   HCT 35.5 (L) 36.0 - 46.0 %   MCV 96.2 80.0 - 100.0 fL   MCH 32.2 26.0 - 34.0 pg   MCHC 33.5 30.0 - 36.0 g/dL   RDW 12.3 11.5 - 15.5 %   Platelets 165 150 - 400 K/uL   nRBC 0.0 0.0 - 0.2 %    Comment: Performed at [facility] [address] [place], [place] [address]    Current Facility-Administered Medications  Medication Dose Route Frequency Provider Last Rate Last Admin   0.9 %  sodium chloride infusion  250 mL Intravenous Continuous Rigoberto Noel, MD       acetaminophen (TYLENOL) tablet 650 mg  650 mg Oral Q6H PRN Hongalgi, Lenis Dickinson, MD       amoxicillin-clavulanate (AUGMENTIN) 875-125 MG per tablet 1 tablet  1 tablet Oral Q12H Hongalgi, Lenis Dickinson, MD   1 tablet at 06/27/22 1123   docusate (COLACE) 50 MG/5ML liquid 100 mg  100 mg Oral BID PRN Modena Jansky, MD       DULoxetine (CYMBALTA) DR capsule 30 mg  30 mg Oral Daily Suella Broad, FNP   30 mg at 06/27/22 1318   DULoxetine (CYMBALTA) DR capsule 60 mg  60 mg Oral Daily Suella Broad, FNP   60 mg at 42/59/56 3875   folic acid (FOLVITE) tablet 1 mg  1 mg  Oral Daily Vernell Leep D, MD   1 mg at 06/27/22 0842   gabapentin (NEURONTIN) capsule 100 mg  100 mg Oral BID WC Suella Broad, FNP   100 mg at 06/27/22 1317   gabapentin (NEURONTIN) capsule 200 mg  200 mg Oral QHS Hongalgi, Anand D, MD       heparin injection 5,000 Units  5,000 Units Subcutaneous Q8H Kara Mead V, MD   5,000 Units at 06/27/22 1318   LORazepam (ATIVAN) tablet 1-4 mg  1-4 mg Oral Q1H PRN Rigoberto Noel, MD       Or   LORazepam (ATIVAN) injection 1-4 mg  1-4 mg Intravenous Q1H PRN Rigoberto Noel, MD   1 mg at 06/27/22 6433   multivitamin with minerals tablet 1 tablet  1 tablet Oral Daily Rigoberto Noel, MD   1 tablet at 06/27/22 2951   Oral care mouth rinse  15 mL Mouth Rinse PRN Rigoberto Noel, MD       thiamine (VITAMIN B1) tablet 100 mg  100 mg Oral Daily Kara Mead V, MD   100 mg at 06/27/22 8841   Or   thiamine (VITAMIN B1) injection 100 mg  100 mg Intravenous Daily Rigoberto Noel, MD       traZODone (DESYREL) tablet 50 mg  50 mg Oral QHS PRN Suella Broad, FNP        Musculoskeletal: Strength & Muscle Tone: within normal limits Gait & Station: normal Patient leans: N/A            Psychiatric Specialty Exam:  Presentation  General Appearance: Appropriate for Environment; Casual  Eye Contact:Good  Speech:Clear and Coherent; Normal Rate  Speech Volume:Normal  Handedness:Right   Mood and Affect  Mood:Anxious; Depressed  Affect:Appropriate; Congruent   Thought Process  Thought Processes:Coherent; Linear  Descriptions of Associations:Intact  Orientation:Full (Time, Place and Person)  Thought Content:Logical  History of Schizophrenia/Schizoaffective disorder:No  Duration of Psychotic Symptoms:No data recorded Hallucinations:Hallucinations: None  Ideas of Reference:None  Suicidal Thoughts:Suicidal Thoughts: Yes, Active SI Active Intent  and/or Plan: With Intent  Homicidal Thoughts:Homicidal Thoughts:  No   Sensorium  Memory:Immediate Good; Recent Good; Remote Good  Judgment:Impaired  Insight:Fair   Executive Functions  Concentration:Fair  Attention Span:Good  [place] of Knowledge:Good  Language:Good   Psychomotor Activity  Psychomotor Activity:Psychomotor Activity: Normal; Tremor   Assets  Assets:Communication Skills; Desire for Improvement; Financial Resources/Insurance; Resilience; Social Support   Sleep  Sleep:Sleep: Fair   Physical Exam: Physical Exam Vitals and nursing note reviewed.  Constitutional:      Appearance: Normal appearance. She is normal weight.  HENT:     Head: Normocephalic.  Musculoskeletal:        General: Normal range of motion.  Skin:    General: Skin is warm and dry.     Capillary Refill: Capillary refill takes less than 2 seconds.  Neurological:     General: No focal deficit present.     Mental Status: She is alert and oriented to person, place, and time. Mental status is at baseline.     Motor: Tremor present.  Psychiatric:        Mood and Affect: Mood normal.        Behavior: Behavior normal.        Thought Content: Thought content normal.        Judgment: Judgment normal.    Review of Systems  Neurological:  Positive for tremors.  Psychiatric/Behavioral:  Positive for depression, substance abuse and suicidal ideas. Negative for hallucinations. The patient is nervous/anxious and has insomnia.   All other systems reviewed and are negative.  Blood pressure (!) 152/98, pulse 94, temperature 97.9 F (36.6 C), temperature source Oral, resp. rate 20, height 5' 3.5" (1.613 m), weight 65.3 kg, SpO2 100 %. Body mass index is 25.09 kg/m.  Treatment Plan Summary: Daily contact with patient to assess and evaluate symptoms and progress in treatment, Medication management, and Plan recommend initiating CIWA protocol. -Patient is okay resuming Librium detox.  Recommend drawing B12, folate acid, thiamine level and replaced  as appropriate. -Continue suicide sitter. -Patient continues to meet criteria for involuntary commitment. -Will resume home medications of Cymbalta, trazodone, and gabapentin.  -Psychiatry will continue to monitor and follow daily.  Disposition: Recommend psychiatric Inpatient admission when medically cleared.  Suella Broad, FNP 06/27/2022 1:23 PM

## 2022-06-28 ENCOUNTER — Encounter (HOSPITAL_COMMUNITY): Payer: Self-pay | Admitting: Family

## 2022-06-28 ENCOUNTER — Inpatient Hospital Stay (HOSPITAL_COMMUNITY)
Admission: AD | Admit: 2022-06-28 | Discharge: 2022-07-06 | DRG: 885 | Disposition: A | Discharge status: Home / Self Care | Payer: Federal, State, Local not specified - Other | Source: Intra-hospital | Attending: Psychiatry | Admitting: Psychiatry

## 2022-06-28 DIAGNOSIS — Z811 Family history of alcohol abuse and dependence: Secondary | ICD-10-CM | POA: Diagnosis not present

## 2022-06-28 DIAGNOSIS — Z9151 Personal history of suicidal behavior: Secondary | ICD-10-CM

## 2022-06-28 DIAGNOSIS — F3132 Bipolar disorder, current episode depressed, moderate: Principal | ICD-10-CM | POA: Diagnosis present

## 2022-06-28 DIAGNOSIS — F1020 Alcohol dependence, uncomplicated: Secondary | ICD-10-CM | POA: Diagnosis present

## 2022-06-28 DIAGNOSIS — Z818 Family history of other mental and behavioral disorders: Secondary | ICD-10-CM

## 2022-06-28 DIAGNOSIS — Z79899 Other long term (current) drug therapy: Secondary | ICD-10-CM

## 2022-06-28 DIAGNOSIS — L259 Unspecified contact dermatitis, unspecified cause: Secondary | ICD-10-CM | POA: Diagnosis not present

## 2022-06-28 DIAGNOSIS — Z20822 Contact with and (suspected) exposure to covid-19: Secondary | ICD-10-CM | POA: Diagnosis present

## 2022-06-28 DIAGNOSIS — R252 Cramp and spasm: Secondary | ICD-10-CM | POA: Diagnosis not present

## 2022-06-28 DIAGNOSIS — T421X5A Adverse effect of iminostilbenes, initial encounter: Secondary | ICD-10-CM | POA: Diagnosis not present

## 2022-06-28 DIAGNOSIS — Z96651 Presence of right artificial knee joint: Secondary | ICD-10-CM | POA: Diagnosis present

## 2022-06-28 DIAGNOSIS — F1010 Alcohol abuse, uncomplicated: Secondary | ICD-10-CM | POA: Diagnosis not present

## 2022-06-28 DIAGNOSIS — F332 Major depressive disorder, recurrent severe without psychotic features: Secondary | ICD-10-CM | POA: Diagnosis not present

## 2022-06-28 DIAGNOSIS — E871 Hypo-osmolality and hyponatremia: Secondary | ICD-10-CM | POA: Diagnosis not present

## 2022-06-28 DIAGNOSIS — F411 Generalized anxiety disorder: Secondary | ICD-10-CM | POA: Diagnosis present

## 2022-06-28 DIAGNOSIS — G4700 Insomnia, unspecified: Secondary | ICD-10-CM | POA: Diagnosis present

## 2022-06-28 LAB — RESP PANEL BY RT-PCR (FLU A&B, COVID) ARPGX2
Influenza A by PCR: NEGATIVE
Influenza B by PCR: NEGATIVE
SARS Coronavirus 2 by RT PCR: NEGATIVE

## 2022-06-28 MED ORDER — TRAZODONE HCL 50 MG PO TABS
50.0000 mg | ORAL_TABLET | Freq: Every evening | ORAL | Status: DC | PRN
  Administered 2022-06-28 – 2022-06-29 (×2): 50 mg via ORAL
  Filled 2022-06-28 (×2): qty 1

## 2022-06-28 MED ORDER — TRIAMCINOLONE ACETONIDE 0.1 % EX CREA: TOPICAL_CREAM | Freq: Three times a day (TID) | CUTANEOUS | 0 refills | Status: DC | PRN

## 2022-06-28 MED ORDER — ALUM & MAG HYDROXIDE-SIMETH 200-200-20 MG/5ML PO SUSP: 30.0000 mL | ORAL | Status: DC | PRN

## 2022-06-28 MED ORDER — THIAMINE HCL 100 MG PO TABS
100.0000 mg | ORAL_TABLET | Freq: Every day | ORAL | Status: DC
  Administered 2022-06-29 – 2022-07-06 (×8): 100 mg via ORAL
  Filled 2022-06-28 (×10): qty 1

## 2022-06-28 MED ORDER — DOCUSATE SODIUM 50 MG/5ML PO LIQD: 100.0000 mg | Freq: Two times a day (BID) | ORAL | Status: DC | PRN

## 2022-06-28 MED ORDER — GABAPENTIN 100 MG PO CAPS
200.0000 mg | ORAL_CAPSULE | Freq: Every day | ORAL | Status: DC
  Administered 2022-06-28 – 2022-06-29 (×2): 200 mg via ORAL
  Filled 2022-06-28 (×5): qty 2

## 2022-06-28 MED ORDER — ACETAMINOPHEN 325 MG PO TABS
650.0000 mg | ORAL_TABLET | Freq: Four times a day (QID) | ORAL | Status: DC | PRN
  Administered 2022-06-29 – 2022-07-04 (×2): 650 mg via ORAL
  Filled 2022-06-28 (×2): qty 2

## 2022-06-28 MED ORDER — GABAPENTIN 100 MG PO CAPS
100.0000 mg | ORAL_CAPSULE | Freq: Two times a day (BID) | ORAL | Status: DC
  Administered 2022-06-29 – 2022-06-30 (×4): 100 mg via ORAL
  Filled 2022-06-28 (×7): qty 1

## 2022-06-28 MED ORDER — FOLIC ACID 1 MG PO TABS
1.0000 mg | ORAL_TABLET | Freq: Every day | ORAL | Status: DC
  Administered 2022-06-29 – 2022-07-06 (×8): 1 mg via ORAL
  Filled 2022-06-28 (×10): qty 1

## 2022-06-28 MED ORDER — MAGNESIUM HYDROXIDE 400 MG/5ML PO SUSP
30.0000 mL | Freq: Every day | ORAL | Status: DC | PRN
Start: 2022-06-28 — End: 2022-07-06

## 2022-06-28 MED ORDER — DULOXETINE HCL 60 MG PO CPEP
60.0000 mg | ORAL_CAPSULE | Freq: Every day | ORAL | Status: DC
  Administered 2022-06-29 – 2022-06-30 (×2): 60 mg via ORAL
  Filled 2022-06-28 (×4): qty 1

## 2022-06-28 MED ORDER — TRIAMCINOLONE ACETONIDE 0.1 % EX CREA
TOPICAL_CREAM | Freq: Three times a day (TID) | CUTANEOUS | Status: DC | PRN
  Filled 2022-06-28: qty 15

## 2022-06-28 MED ORDER — DULOXETINE HCL 30 MG PO CPEP
30.0000 mg | ORAL_CAPSULE | Freq: Every day | ORAL | Status: DC
  Administered 2022-06-29 – 2022-06-30 (×2): 30 mg via ORAL
  Filled 2022-06-28 (×4): qty 1

## 2022-06-28 MED ORDER — TRIAMCINOLONE ACETONIDE 0.1 % EX CREA
TOPICAL_CREAM | Freq: Three times a day (TID) | CUTANEOUS | Status: DC | PRN
  Filled 2022-06-28 (×2): qty 15

## 2022-06-28 NOTE — TOC Transition Note (Signed)
Transition of Care [facility]) - CM/SW Discharge Note   Patient Details  Name: Debra Rhodes MRN: [id number] Date of Birth: [birth year]  Transition of Care [facility]) CM/SW Contact:  Milinda Antis, [place] Phone Number: 06/28/2022, 3:04 PM   Clinical Narrative:    Patient will DC to: [facility] inpatient facility Anticipated DC date: 06/28/2022 Transport by: Brandon Melnick   Per MD patient ready for DC to [facility]. RN to call report prior to discharge [phone]). RN, patient, patient's family, and facility notified of DC. IVC faxed to facility.  Patient can arrive at 22:00.  CSW will sign off for now as social work intervention is no longer needed. Please consult [place] again if new needs arise.     Final next level of care: Psychiatric Hospital Barriers to Discharge: Barriers Resolved   Patient Goals and CMS Choice Patient states their goals for this hospitalization and ongoing recovery are:: To receive help      Discharge Placement              Patient chooses bed at:  [facility]) Patient to be transferred to facility by: [facility] Name of family member notified: Kasidi, Shanker (Daughter)   [phone] Patient and family notified of of transfer: 06/28/22  Discharge Plan and Services In-house Referral: Clinical Social Work                                   Social Determinants of Health ([place]) Interventions     Readmission Risk Interventions     No data to display

## 2022-06-28 NOTE — TOC Progression Note (Signed)
Transition of Care [facility]) - Initial/Assessment Note    Patient Details  Name: Debra Rhodes MRN: [id number] Date of Birth: 06-03-1961  Transition of Care [facility]) CM/SW Contact:    Milinda Antis, [place] Phone Number: 06/28/2022, 8:57 AM  Clinical Narrative:                 08:30-  CSW inquired about bed availability at [facility] and is awaiting a response.  CSW sent additional referrals to : [facility]- under MD review [facility]- awaiting response [facility]- awaiting response  TOC will continue to follow.  Expected Discharge Plan: Psychiatric Hospital Barriers to Discharge: Other (must enter comment) (awaiting a bed a Psych IP facility)   Patient Goals and CMS Choice Patient states their goals for this hospitalization and ongoing recovery are:: To receive help      Expected Discharge Plan and Services Expected Discharge Plan: Psychiatric Hospital In-house Referral: Clinical Social Work     Living arrangements for the past 2 months: Apartment                                      Prior Living Arrangements/Services Living arrangements for the past 2 months: Apartment Lives with:: Self   Do you feel safe going back to the place where you live?: Yes               Activities of Daily Living Home Assistive Devices/Equipment: None ADL Screening (condition at time of admission) Patient's cognitive ability adequate to safely complete daily activities?: Yes Is the patient deaf or have difficulty hearing?: Yes Does the patient have difficulty seeing, even when wearing glasses/contacts?: Yes Does the patient have difficulty concentrating, remembering, or making decisions?: Yes Patient able to express need for assistance with ADLs?: Yes Does the patient have difficulty dressing or bathing?: No Independently performs ADLs?: Yes (appropriate for developmental age) Does the patient have difficulty walking or climbing stairs?: Yes Weakness of Legs: Both Weakness of Arms/Hands:  None  Permission Sought/Granted                  Emotional Assessment Appearance:: Appears stated age Attitude/Demeanor/Rapport: Engaged Affect (typically observed): Apprehensive Orientation: : Oriented to Situation, Oriented to  Time, Oriented to Place Alcohol / Substance Use: Alcohol Use Psych Involvement: Yes (comment)  Admission diagnosis:  Overdose [T50.901A] Acute respiratory failure, unspecified whether with hypoxia or hypercapnia ([place]) [J96.00] Intentional overdose, initial encounter ([place]) [T50.902A] Patient Active Problem List   Diagnosis Date Noted   Overdose 06/23/2022   Acute respiratory failure ([place])    Hypokalemia    Acute encephalopathy    Chronic alcoholism in remission ([place]) 02/22/2022   Iron deficiency 02/22/2022   Other specified behavioral and emotional disorders with onset usually occurring in childhood and adolescence 02/22/2022   Primary insomnia 02/22/2022   Raynaud's disease 02/22/2022   Acute non-recurrent frontal sinusitis 12/22/2021   Neoplasm of uncertain behavior of skin 11/01/2021   Generalized anxiety disorder 10/27/2021   Osteoarthritis of left knee 10/23/2021   Osteoarthrosis of hand 10/23/2021   Iron deficiency anemia 09/27/2021   Mixed hyperlipidemia 09/27/2021   History of alcohol abuse 09/27/2021   Elevated serum hCG 09/27/2021   Fatigue 09/27/2021   Nasal congestion 08/16/2021   Suicidal ideation    Depression    Alcohol abuse 01/21/2021   MDD (major depressive disorder), recurrent severe, without psychosis ([place]) 01/21/2021   Pain in joint of  right shoulder 12/24/2019   Deviated septum 06/30/2019   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

## 2022-06-28 NOTE — Hospital Course (Signed)
61 year old female, lives alone, independent, PMH of alcohol dependence, MDD, iron deficiency anemia, mixed HLD, generalized anxiety disorder, presented to the [facility] ED 06/23/2022 after being found unresponsive in her home by her sponsor who went to check on her after patient had told the sponsor and her ex-boyfriend that she was over it and wanted to kill herself.  EMS found her minimally responsive.  It was suspected that she had taken at least #50, 50 mg pills of trazodone along with Ativan, Librium and alcohol.  She had been seen in the ED 2 days prior for anxiety while she was waiting to get help for alcohol dependence, initially had come in with suicidal ideation but subsequently denied any intention to EDP and just stated that she was very depressed and wanting to quit drinking and smoking, she was discharged from ED on Librium.  In ED, she had GCS of around 5, underwent intubation for airway protection, suspected she had aspiration prior to intubation.  She was admitted to ICU.  Extubated 8/6.  Transferred from P[facility] to [facility] on 8/7.  Medically optimized for DC to inpatient psychiatry when a bed is available.

## 2022-06-28 NOTE — Progress Notes (Signed)
Debra Rhodes [phone] please give daughter a call when she transfers

## 2022-06-28 NOTE — Plan of Care (Signed)
Patient is adjusting well. Cooperative and expressing motivation for treatment.

## 2022-06-28 NOTE — Discharge Summary (Signed)
Physician Discharge Summary   Patient: Debra Rhodes MRN: [id number] DOB: Sep 26, 1961  Admit date:     06/23/2022  Discharge date: 06/28/22  Discharge Physician: Marylu Lund   PCP: Jeanie Sewer, NP   Recommendations at discharge:    Follow up with Behavioral health services Follow up with PCP when ultimately discharged Recommend follow-up chest x-ray in 4 weeks to ensure resolution of pneumonia findings  Discharge Diagnoses: Principal Problem:   Overdose  Resolved Problems:   * No resolved hospital problems. *  Hospital Course: 61 year old female, lives alone, independent, PMH of alcohol dependence, MDD, iron deficiency anemia, mixed HLD, generalized anxiety disorder, presented to the [facility] ED 06/23/2022 after being found unresponsive in her home by her sponsor who went to check on her after patient had told the sponsor and her ex-boyfriend that she was over it and wanted to kill herself.  EMS found her minimally responsive.  It was suspected that she had taken at least #50, 50 mg pills of trazodone along with Ativan, Librium and alcohol.  She had been seen in the ED 2 days prior for anxiety while she was waiting to get help for alcohol dependence, initially had come in with suicidal ideation but subsequently denied any intention to EDP and just stated that she was very depressed and wanting to quit drinking and smoking, she was discharged from ED on Librium.  In ED, she had GCS of around 5, underwent intubation for airway protection, suspected she had aspiration prior to intubation.  She was admitted to ICU.  Extubated 8/6.  Transferred from PCCM to [facility] on 8/7.  Medically optimized for DC to inpatient psychiatry when a bed is available.   Assessment and Plan: Intentional overdose, suspected suicide attempt: Reportedly overdosed on undetermined amount of trazodone along with Ativan, Librium and alcohol.  Psychiatry input appreciated, recommend inpatient [facility] admission once medically  cleared.  Patient is now medically optimized for DC to inpatient psychiatry pending bed availability.  This has been communicated with psychiatry team.  PT has evaluated and recommend no follow-up. Continue Air cabin crew.  Initially home polypharmacy including chlordiazepoxide, duloxetine, gabapentin and trazodone were held.  Psychiatry concerned about serotonin syndrome and will defer to them regarding initiation of these.  Patient currently IVC but noted to be voluntarily agreeable to go to inpatient behavioral health Hospital.  Suicide precautions.  Psychiatry follow-up appreciated, remains appropriate for inpatient psychiatric, the have resumed home Cymbalta, trazodone and gabapentin.  Plan d/c to psych today   Acute respiratory failure with hypoxia: Due to inability to protect airway from AMS related to intentional drug overdose.  Extubated 8/7.  Aggressive incentive spirometry.  Weaned off to room air.  Hypoxia resolved.  Presumed aspiration pneumonia: Completed 4 days of IV Unasyn, changed to Augmentin and will complete total 5-day course on 8/8.  Recommend follow-up chest x-ray in 4 weeks to ensure resolution of pneumonia findings.   Body mass index is 25.09 kg/m.   Alcohol dependence: Reports that she has been drinking anywhere between 1-2 bottles of wine per day for the last 3 months.  Cessation counseled.  Continue CIWA protocol, thiamine, folate and multivitamins.  Some tremors.  Mostly low CIWA scores. Remained stable  Anxiety and depression: Defer management to psychiatry.  Resumed meds as noted above.   Generalized weakness: Multifactorial, likely due to hospitalization in the context of overdose, intubation and mechanical ventilation.  PT recommends no outpatient follow-up.   Anemia: Suspect chronic.  Stable.  Thrombocytopenia: Likely related to  alcohol toxicity.  No bleeding reported.  Resolved.   Rash with excoriations -Pruritic rash over localized areas including gluteal  region and elbow crease noted with skin excoriations noted. Pt admits to scratching areas because of itching -No evidence of acute infection -Given trial of topical steroid       Consultants: PCCM, Psychiatry Procedures performed:   Disposition:  Behavioral Health Diet recommendation:  Regular diet DISCHARGE MEDICATION: Allergies as of 06/28/2022       Reactions   Latex Shortness Of Breath, Swelling, Rash        Medication List     STOP taking these medications    chlordiazePOXIDE 25 MG capsule Commonly known as: LIBRIUM   Jornay PM 20 MG Cp24 Generic drug: Methylphenidate HCl ER (PM)   naltrexone 50 MG tablet Commonly known as: DEPADE   nitrofurantoin (macrocrystal-monohydrate) 100 MG capsule Commonly known as: MACROBID   Rexulti 2 MG Tabs tablet Generic drug: brexpiprazole       TAKE these medications    cetirizine 10 MG tablet Commonly known as: ZYRTEC Take 10 mg by mouth daily.   DULoxetine 30 MG capsule Commonly known as: CYMBALTA Take 1 capsule (30 mg total) by mouth daily. Take with a 60 mg capsule to equal total daily dose of 90 mg   DULoxetine 60 MG capsule Commonly known as: Cymbalta Take 1 capsule (60 mg total) by mouth daily. Take with a 30 mg capsule to equal total daily dose of 90 mg.   estradiol-norethindrone 0.05-0.14 MG/DAY Commonly known as: COMBIPATCH Place 1 patch onto the skin once a week.   gabapentin 100 MG capsule Commonly known as: NEURONTIN Take 100 mg in the morning and 100 mg at noon, then take (2) capsules 200 mg in the evening. What changed:  how much to take how to take this when to take this additional instructions   multivitamin tablet Take 1 tablet by mouth daily.   traZODone 50 MG tablet Commonly known as: DESYREL Take 1-2 tabs at bedtime as needed for insomnia   triamcinolone cream 0.1 % Commonly known as: KENALOG Apply topically 3 (three) times daily as needed (itching).        Follow-up  Information     Jeanie Sewer, NP Follow up in 1 week(s).   Specialty: Family Medicine Why: Hospital follow up Contact information: [place] [address] [phone]                Discharge Exam: Danley Danker Weights   06/26/22 2130  Weight: 65.3 kg   General exam: Awake, laying in bed, in nad Respiratory system: Normal respiratory effort, no wheezing Cardiovascular system: regular rate, s1, s2 Gastrointestinal system: Soft, nondistended, positive BS Central nervous system: CN2-12 grossly intact, strength intact Extremities: Perfused, no clubbing Skin: Normal skin turgor, no notable skin lesions seen Psychiatry: Mood normal // no visual hallucinations   Condition at discharge: fair  The results of significant diagnostics from this hospitalization (including imaging, microbiology, ancillary and laboratory) are listed below for reference.   Imaging Studies: DG Chest Port 1 View  Result Date: 06/24/2022 CLINICAL DATA:  Respiratory failure. EXAM: PORTABLE CHEST 1 VIEW COMPARISON:  06/23/2022 FINDINGS: Mild linear opacity at the left lung base, consistent with atelectasis, is stable. Remainder of the lungs is clear. No convincing pleural effusion and no pneumothorax. Endotracheal tube and nasal/orogastric tube are stable in well positioned. IMPRESSION: 1. No acute cardiopulmonary disease and no interval change. 2. Stable support apparatus. Electronically Signed  By: Lajean Manes M.D.   On: 06/24/2022 08:18   CT Head Wo Contrast  Result Date: 06/23/2022 CLINICAL DATA:  Altered mental status, found down EXAM: CT HEAD WITHOUT CONTRAST CT CERVICAL SPINE WITHOUT CONTRAST TECHNIQUE: Multidetector CT imaging of the head and cervical spine was performed following the standard protocol without intravenous contrast. Multiplanar CT image reconstructions of the cervical spine were also generated. RADIATION DOSE REDUCTION: This exam was performed according to the  departmental dose-optimization program which includes automated exposure control, adjustment of the mA and/or kV according to patient size and/or use of iterative reconstruction technique. COMPARISON:  None Available. FINDINGS: CT HEAD FINDINGS Brain: No intracranial hemorrhage, mass effect, or evidence of acute infarct. No hydrocephalus. No extra-axial fluid collection. Generalized cerebral atrophy. Ill-defined hypoattenuation within the cerebral white matter is nonspecific but consistent with chronic small vessel ischemic disease. Vascular: No hyperdense vessel or unexpected calcification. Skull: No fracture or focal lesion. Sinuses/Orbits: No acute finding. Paranasal sinuses and mastoid air cells are well aerated. Other: Partially visualized endotracheal and enteric tubes. CT CERVICAL SPINE FINDINGS Alignment: Slight anterolisthesis C4 on C5, likely chronic. Skull base and vertebrae: No acute fracture. No primary bone lesion or focal pathologic process. Soft tissues and spinal canal: No prevertebral fluid or swelling. No visible canal hematoma. Disc levels: Multilevel spondylosis, disc space height loss and degenerative endplate changes greatest at C5-C6 where it is advanced. Posterior disc osteophyte complex at C5-C6 causes mild effacement of the ventral thecal sac. Multilevel uncovertebral spurring and facet arthropathy causes neural foraminal narrowing greatest at C5-C6 where it is advanced bilaterally. Additional advanced neural foraminal narrowing on the right at C3-C4. Ankylosis of the bilateral C4-C5 facets. Upper chest: Negative. Other: None. IMPRESSION: 1. No acute intracranial abnormality. Generalized atrophy and small vessel white matter disease. 2. No acute fracture in the cervical spine. Multilevel degenerative spondylosis. Electronically Signed   By: Placido Sou M.D.   On: 06/23/2022 17:12   CT Cervical Spine Wo Contrast  Result Date: 06/23/2022 CLINICAL DATA:  Altered mental status, found  down EXAM: CT HEAD WITHOUT CONTRAST CT CERVICAL SPINE WITHOUT CONTRAST TECHNIQUE: Multidetector CT imaging of the head and cervical spine was performed following the standard protocol without intravenous contrast. Multiplanar CT image reconstructions of the cervical spine were also generated. RADIATION DOSE REDUCTION: This exam was performed according to the departmental dose-optimization program which includes automated exposure control, adjustment of the mA and/or kV according to patient size and/or use of iterative reconstruction technique. COMPARISON:  None Available. FINDINGS: CT HEAD FINDINGS Brain: No intracranial hemorrhage, mass effect, or evidence of acute infarct. No hydrocephalus. No extra-axial fluid collection. Generalized cerebral atrophy. Ill-defined hypoattenuation within the cerebral white matter is nonspecific but consistent with chronic small vessel ischemic disease. Vascular: No hyperdense vessel or unexpected calcification. Skull: No fracture or focal lesion. Sinuses/Orbits: No acute finding. Paranasal sinuses and mastoid air cells are well aerated. Other: Partially visualized endotracheal and enteric tubes. CT CERVICAL SPINE FINDINGS Alignment: Slight anterolisthesis C4 on C5, likely chronic. Skull base and vertebrae: No acute fracture. No primary bone lesion or focal pathologic process. Soft tissues and spinal canal: No prevertebral fluid or swelling. No visible canal hematoma. Disc levels: Multilevel spondylosis, disc space height loss and degenerative endplate changes greatest at C5-C6 where it is advanced. Posterior disc osteophyte complex at C5-C6 causes mild effacement of the ventral thecal sac. Multilevel uncovertebral spurring and facet arthropathy causes neural foraminal narrowing greatest at C5-C6 where it is advanced bilaterally. Additional advanced neural  foraminal narrowing on the right at C3-C4. Ankylosis of the bilateral C4-C5 facets. Upper chest: Negative. Other: None.  IMPRESSION: 1. No acute intracranial abnormality. Generalized atrophy and small vessel white matter disease. 2. No acute fracture in the cervical spine. Multilevel degenerative spondylosis. Electronically Signed   By: Placido Sou M.D.   On: 06/23/2022 17:12   DG Chest Portable 1 View  Result Date: 06/23/2022 CLINICAL DATA:  Altered mental status, difficulty breathing EXAM: PORTABLE CHEST 1 VIEW COMPARISON:  None Available. FINDINGS: Cardiac size is within normal limits. There are no signs of alveolar edema or focal pulmonary consolidation. Small linear densities are seen in left lower lung field suggesting scarring or subsegmental atelectasis. There is minimal blunting of left lateral CP angle. There is no pneumothorax. Tip of endotracheal tube is 4 cm above the carina. NG tube is noted traversing the esophagus. IMPRESSION: There are no signs of pulmonary edema or focal pulmonary consolidation. Subtle increase in interstitial markings in the lateral left lower lung field may suggest scarring or subsegmental atelectasis. Possible minimal left pleural effusion. Electronically Signed   By: Elmer Picker M.D.   On: 06/23/2022 16:27    Microbiology: Results for orders placed or performed during the hospital encounter of 06/23/22  MRSA Next Gen by PCR, Nasal     Status: None   Collection Time: 06/23/22  6:31 PM   Specimen: Nasal Mucosa; Nasal Swab  Result Value Ref Range Status   MRSA by PCR Next Gen NOT DETECTED NOT DETECTED Final    Comment: (NOTE) The GeneXpert MRSA Assay (FDA approved for NASAL specimens only), is one component of a comprehensive MRSA colonization surveillance program. It is not intended to diagnose MRSA infection nor to guide or monitor treatment for MRSA infections. Test performance is not FDA approved in patients less than 59 years old. Performed at [facility], [place] [address] [place], [place] [address]     Labs: CBC: Recent Labs  Lab 06/21/22 2106  06/23/22 1514 06/23/22 1518 06/23/22 1723 06/24/22 0305 06/24/22 0519 06/25/22 0221 06/26/22 1030  WBC 6.9 5.0  --   --  6.7  --  5.5 4.2  NEUTROABS 4.2  --   --   --   --   --  3.5  --   HGB 13.5 12.4   < > 12.6 11.2* 10.5* 11.0* 11.9*  HCT 39.5 36.8   < > 37.0 32.8* 31.0* 32.7* 35.5*  MCV 93.8 96.1  --   --  94.0  --  95.6 96.2  PLT 231 178  --   --  169  --  141* 165   < > = values in this interval not displayed.   Basic Metabolic Panel: Recent Labs  Lab 06/21/22 2106 06/23/22 1514 06/23/22 1518 06/23/22 1723 06/23/22 1725 06/23/22 2113 06/24/22 0305 06/24/22 0519 06/25/22 0024  NA 140 138 138 136  --  139 138 140 136  K 4.0 3.4* 3.3* 3.7  --  3.6 3.6 3.5 4.2  CL 105 104 103  --   --  106 106  --  106  CO2 24 23  --   --   --  23 24  --  21*  GLUCOSE 97 102* 101*  --   --  96 96  --  105*  BUN 8 7* 8  --   --  7* 5*  --  7*  CREATININE 0.57 0.71 0.90  --   --  0.65 0.68  --  0.94  CALCIUM 8.9 8.3*  --   --   --  8.5* 8.3*  --  8.1*  MG  --   --   --   --  2.1  --  1.4*  --  2.1  PHOS  --   --   --   --   --   --   --   --  3.0   Liver Function Tests: Recent Labs  Lab 06/21/22 2106 06/23/22 1725  AST 31 36  ALT 18 21  ALKPHOS 45 52  BILITOT 0.5 0.8  PROT 7.1 7.6  ALBUMIN 4.0 4.1   CBG: Recent Labs  Lab 06/23/22 2323 06/24/22 0329 06/24/22 0750 06/24/22 1948 06/25/22 1938  GLUCAP 77 98 100* 96 110*    Discharge time spent: less than 30 minutes.  Signed: Marylu Lund, MD [facility] 06/28/2022

## 2022-06-28 NOTE — Progress Notes (Signed)
Reports not eating much when she relapses on alcohol. Spends longer time drinking

## 2022-06-28 NOTE — Progress Notes (Signed)
Physical Therapy Treatment and Discharge Patient Details Name: Debra Rhodes MRN: [id number] DOB: [birth year] Today's Date: 06/28/2022   History of Present Illness Pt is a 61 y/o female admitted 8/4 secondary to overdose. Was intubated on 8/4 and extubated on 8/6. PMH includes alcohol dependence, MDD, anxiety, and alcohol abuse.    PT Comments    Patient recalled all exercises except sit to stand (was able to easily incr to 10 reps). Demonstrated independence with all mobility and gait. No further PT needs identified. Discharge from PT.     Recommendations for follow up therapy are one component of a multi-disciplinary discharge planning process, led by the attending physician.  Recommendations may be updated based on patient status, additional functional criteria and insurance authorization.  Follow Up Recommendations  No PT follow up     Assistance Recommended at Discharge None  Patient can return home with the following     Equipment Recommendations  None recommended by PT    Recommendations for Other Services       Precautions / Restrictions Precautions Precautions: Other (comment) Precaution Comments: suicide Restrictions Weight Bearing Restrictions: No     Mobility  Bed Mobility Overal bed mobility: Needs Assistance Bed Mobility: Supine to Sit     Supine to sit: Independent          Transfers Overall transfer level: Independent   Transfers: Sit to/from Stand Sit to Stand: Independent           General transfer comment: repeated x 10 reps for strengthening    Ambulation/Gait Ambulation/Gait assistance: Independent Gait Distance (Feet): 500 Feet Assistive device: None Gait Pattern/deviations: WFL(Within Functional Limits) Gait velocity: Decreased Gait velocity interpretation: >2.62 ft/sec, indicative of community ambulatory   General Gait Details: Improved balance noted this session. Pt reports some weakness, but tolerated mobility  well.   Stairs             Wheelchair Mobility    Modified Rankin (Stroke Patients Only)       Balance Overall balance assessment: Needs assistance Sitting-balance support: No upper extremity supported, Feet supported Sitting balance-Leahy Scale: Fair     Standing balance support: Single extremity supported Standing balance-Leahy Scale: Fair                              Cognition Arousal/Alertness: Awake/alert Behavior During Therapy: WFL for tasks assessed/performed Overall Cognitive Status: Within Functional Limits for tasks assessed                                 General Comments: Seemed to be [facility] for basic tasks        Exercises General Exercises - Lower Extremity Ankle Circles/Pumps: AROM, Both, 10 reps Heel Slides: AROM, Both, 10 reps, Supine Straight Leg Raises: AROM, Both, 10 reps Other Exercises Other Exercises: Sit<>stand X 10 for LE strengthing.    General Comments        Pertinent Vitals/Pain Pain Assessment Pain Assessment: No/denies pain    Home Living                          Prior Function            PT Goals (current goals can now be found in the care plan section) Acute Rehab PT Goals Patient Stated Goal: to be independent PT Goal Formulation: With patient  Time For Goal Achievement: 07/10/22 Potential to Achieve Goals: Good Progress towards PT goals: Goals met/education completed, patient discharged from PT    Frequency    Min 3X/week      PT Plan Current plan remains appropriate    Co-evaluation              AM-PAC PT "6 Clicks" Mobility   Outcome Measure  Help needed turning from your back to your side while in a flat bed without using bedrails?: None Help needed moving from lying on your back to sitting on the side of a flat bed without using bedrails?: None Help needed moving to and from a bed to a chair (including a wheelchair)?: None Help needed standing up from a  chair using your arms (e.g., wheelchair or bedside chair)?: None Help needed to walk in hospital room?: None Help needed climbing 3-5 steps with a railing? : None 6 Click Score: 24    End of Session   Activity Tolerance: Patient tolerated treatment well Patient left: in bed;with call bell/phone within reach;with nursing/sitter in room   PT Visit Diagnosis: Unsteadiness on feet (R26.81);Difficulty in walking, not elsewhere classified (R26.2)   PT Discharge Note  Patient is being discharged from PT services secondary to:  Goals met and no further therapy needs identified.  Please see latest Therapy Progress Note for current level of functioning and progress toward goals.  Progress and discharge plan and discussed with patient/caregiver and they  Agree   Time: 1017-1027 PT Time Calculation (min) (ACUTE ONLY): 10 min  Charges:  $Gait Training: 8-22 mins                      [place]  Office [phone]    Rexanne Mano 06/28/2022, 10:33 AM

## 2022-06-29 ENCOUNTER — Encounter (HOSPITAL_COMMUNITY): Payer: Self-pay | Admitting: Family

## 2022-06-29 ENCOUNTER — Other Ambulatory Visit: Payer: Self-pay

## 2022-06-29 DIAGNOSIS — F332 Major depressive disorder, recurrent severe without psychotic features: Secondary | ICD-10-CM
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
## 2022-06-29 NOTE — Plan of Care (Signed)
Nurse discussed anxiety, depression and coping skills with patient.  

## 2022-06-29 NOTE — Tx Team (Signed)
Initial Treatment Plan 06/29/2022 12:48 AM Debra Rhodes EZB:[id number]    PATIENT STRESSORS: Health problems   Occupational concerns   Substance abuse     PATIENT STRENGTHS: Ability for insight  Average or above average intelligence  Communication skills  General fund of knowledge    PATIENT IDENTIFIED PROBLEMS: Depressed  Substance use  Anxiety  Self-harm ideations/suicide attempt               DISCHARGE CRITERIA:  Ability to meet basic life and health needs Improved stabilization in mood, thinking, and/or behavior Motivation to continue treatment in a less acute level of care Verbal commitment to aftercare and medication compliance Withdrawal symptoms are absent or subacute and managed without 24-hour nursing intervention  PRELIMINARY DISCHARGE PLAN: Outpatient therapy Placement in alternative living arrangements  PATIENT/FAMILY INVOLVEMENT: This treatment plan has been presented to and reviewed with the patient, Debra Rhodes.  The patient hasbeen given the opportunity to ask questions and make suggestions.  Ronelle Nigh, RN 06/29/2022, 12:48 AM

## 2022-06-29 NOTE — [facility] Group Notes (Signed)
Adult Psychoeducational Group Note  Date:  06/29/2022 Time:  9:36 AM  Group Topic/Focus:  Goals Group:   The focus of this group is to help patients establish daily goals to achieve during treatment and discuss how the patient can incorporate goal setting into their daily lives to aide in recovery.  Participation Level:  Did Not Attend    Dub Mikes 06/29/2022, 9:36 AM

## 2022-06-29 NOTE — Progress Notes (Addendum)
D:  Patient's self inventory sheet, patient has fair sleep, sleep medicine given.  Fair appetite, low energy level, poor concentration.  Rated depression 8, hopeless and anxiety 3.  Denied withdrawals.  SI, sometimes, contracts for safety.  Denied HI.  Denied A/V hallucinations.  Feeling more positive, having less distracting thoughts.  Positive self talk.  Plans to go to 28 day rehab post discharge.  Denied discharge plans. A:  Medications administered per MD orders.  Emotional support and encouragement given patient. R:  Denied SI and HI, contracts for safety.   Denied SI while talking to nurse this morning.  Denied A/V  hallucinations.  Safety maintained with 15 minute checks.

## 2022-06-29 NOTE — Progress Notes (Signed)
Pt stated she takes Zyrtec     06/29/22 2100  Psych Admission Type (Psych Patients Only)  Admission Status Involuntary  Psychosocial Assessment  Patient Complaints Anxiety  Eye Contact Fair  Facial Expression Anxious  Affect Anxious  Speech Soft  Interaction Assertive  Motor Activity Slow  Appearance/Hygiene Improved  Behavior Characteristics Cooperative  Mood Depressed  Aggressive Behavior  Effect No apparent injury  Thought Process  Coherency WDL  Content WDL  Delusions WDL  Perception WDL  Hallucination None reported or observed  Judgment WDL  Confusion None  Danger to Self  Current suicidal ideation? Denies  Danger to Others  Danger to Others None reported or observed

## 2022-06-29 NOTE — H&P (Signed)
Psychiatric Admission Assessment Adult  Patient Identification: Debra Rhodes MRN:  [id number] Date of Evaluation:  06/30/2022 Chief Complaint:  MDD (major depressive disorder), recurrent episode, severe ([place]) [F33.2] Principal Diagnosis: Bipolar 1 disorder, depressed, moderate ([place]) Diagnosis:  Principal Problem:   Bipolar 1 disorder, depressed, moderate ([place]) Active Problems:   Alcohol abuse   Generalized anxiety disorder  Total Time spent with patient: 45 minutes  History of Present Illness: Debra Rhodes is a 61 year old female with a past psychiatric history of MDD-recurrent/severe/without psychotic features, GAD, alcohol use disorder-severe/dependent, and multiple prior inpatient psychiatric admissions who presented involuntarily from [facility] after an intentional suicide attempt via ingestion of multiple pills and alcohol.   On Chart Review: On chart review, patient was admitted to [facility] in December 2022 and August 2022 both times for [facility] after relapsing on alcohol and receives outpatient follow-up for therapy and medication managed with [facility]. She was also admitted to [facility] from 3/1 to 01/24/2021 for a suicide attempt by cutting her wrist in the context of alcohol relapse. Patient stated she was sober for 5 months after completing a rehab stay at [facility] after she was discharged from [facility] in March. Most recently she states she was sober for 4 months after discharge from [facility] in August.  Current Outpatient (Home) Medication List:  Cymbalta 90 mg Gabapentin 100 mg qAM and qPM, and 200 mg qHS Naltrexone 50 mg Trazodone 50-100 mg qHS PRN  On Evaluation Today: Patient reports that since her last admission, she has been compliant with outpatient follow-up through [facility], working, and attending AA meetings daily. She says that she has relapsed twice, and each time, she feels shame which leads her to having SI, either passive or active. Patient reports that  triggers of her relapse have included breaking up with her ex-boyfriend, living alone when roommate moved out, and getting set into a routine that leaves minimal time for her to make healthy choices for herself.   We discuss relationship instability at large, and patient completes the [place] for Borderline Personality Disorder, which she scores 8/10. Patient also reports that over the past two weeks, she has felt down, depressed, and hopeless. She endorses anhedonia, insomnia, increased appetite, poor concentration, and decreased energy. We discuss manic symptoms, and whereas previously denied during previous admissions, patient reports that in her 46s, she had an episode where she was awake for 7 days, with elevated mood, flight of ideas, and completing multiple tasks, after which she "crashed." Additionally, she reports mood activation after taking both Prozac and Wellbutrin as monotherapies. She endorses generalized worries about her family, her future, the opinions of others, particularly in her AA group when they find out about her relapse. Patient denies nightmares, flashbacks, and hypervigilance.   She denies active SI today, but endorses passive SI that she wouldn't have to deal with the embarrassment if she were not here. Yesterday, she had the thought to drown herself while on her paddle board. She is able to contract for safety on the unit. She denies HI, as well as AVH. Physically, patient endorses some throat pain from intubation, but denies other somatic complaints today.    In outpatient notes, patient maintained sobriety for approximately 4 months post-discharge of last [facility] admission. She relapsed around 02/2022, but had maintained sobriety since seen 05/30/22. She reported compulsive shopping, and intermittently needed increased dose of Trazodone to sleep, which is overall adequate.   Mode of transport to Hospital: [facility] office  ED course: Patient presented to ED 8/2  for SI with plan to drink herself to death after relapsing and drinking 3 bottles of wine per day. EtOH 250. Patient chose to leave AMA prior to behavioral health assessment after stating that she had no true intention to end her life. She requested Librium prior to discharge from ED for help with nicotine dependence, and she was prescribed a 3-day taper. Patient returned home, told ex-boyfriend and sponsor that she was over it, and was found unresponsive by sponsor who completed welfare check. Suspected that she took 50 Trazodone 50 mg, Ativan, Librium, and alcohol. She had GCS of 5, requiring intubation. She was suspected to have aspirated prior to intubation, and extubated 8/6. She completed treatment for aspiration pneumonia and is recommended for follow-up CXR in 4 weeks.    POA/Legal Guardian: Denies  Past Psychiatric Hx: Previous Psych Diagnoses: depression, alcohol dependence Prior inpatient treatment: [facility] in March, August, and December 2022. Prior outpatient treatment: [facility] Prior rehab hx: Detox and rehab Psychotherapy hx: Previous therapy.  History of suicide: SI, no prior  History of homicide: Denies Psychiatric medication history: "Paxil- Has taken it since 61 yo. Has had times where it has been less effective. Had wt gain and sexual side effects at higher doses (40 mg and 60 mg); Prozac- felt more anxious; Sertraline; Cymbalta- Started on 40 mg and ws increased to 60 mg; Wellbutrin- increased irritability; Trazodone-helpful but causes some excessive somnolence, especially at 100 mg dose; Remeron- Effective, wt gain; Hydroxyzine; Gabapentin- Takes as needed for anxiety and leg cramps. Occ word finding errors with more than 300 mg; Buspar-Increased agitation and anxiety; Concerta- Had increased anxiety; Strattera- Effective but feels "wired"; Dayvigo-effective; Rexulti- increased fatigue at 2 mg; Oneida Alar PM- taking for ADHD, effective." Psychiatric medication compliance  history: Neuromodulation history: Denies Current Psychiatrist: [facility] Current therapist: Denies  Substance Abuse Hx: Alcohol: Up to 3 bottles if alcohol Tobacco: Denies Illicit drugs: Denies use Rx drug abuse: Denies Rehab hx: Multiple rehab and detox  Past Medical History: Medical Diagnoses: Hypercholesterolemia Home Rx: None Prior Hosp: Denies prior  Prior Surgeries/Trauma:  Head trauma, LOC, concussions, seizures:  Allergies: Seasonal, latex LMP: Postmenopausal PCP: Jeanie Sewer, NP  Family Psychiatric  History: Suicide completion by her brother, who also struggled with substance use issues.  Father-substance issues.   Unspecified psychiatric problems with others in her family.  Social History: Has 3 adult daughters Divorced, lives alone Maintained sobriety for 4 months, relapsed 1 time last week, then for the past 3 days, including today. Works for a company called [employer], currently looking for other employment Denies access to firearms Denies legal issues Denies h/o physical/sexual/mental abuse  Is the patient at risk to self? Yes.    Has the patient been a risk to self in the past 6 months? Yes.    Has the patient been a risk to self within the distant past? Yes.    Is the patient a risk to others? No.  Has the patient been a risk to others in the past 6 months? No.  Has the patient been a risk to others within the distant past? No.    Alcohol Screening:  1. How often do you have a drink containing alcohol?: 4 or more times a week 2. How many drinks containing alcohol do you have on a typical day when you are drinking?: 3 or 4 3. How often do you have six or more drinks on one occasion?:  Less than monthly AUDIT-C Score: 6 4. How often during the last year have you found that you were not able to stop drinking once you had started?: Daily or almost daily 5. How often during the last year have you failed to do what was normally  expected from you because of drinking?: Daily or almost daily 6. How often during the last year have you needed a first drink in the morning to get yourself going after a heavy drinking session?: Daily or almost daily 7. How often during the last year have you had a feeling of guilt of remorse after drinking?: Daily or almost daily 8. How often during the last year have you been unable to remember what happened the night before because you had been drinking?: Never 9. Have you or someone else been injured as a result of your drinking?: No 10. Has a relative or friend or a doctor or another health worker been concerned about your drinking or suggested you cut down?: No Alcohol Use Disorder Identification Test Final Score (AUDIT): 22 Alcohol Brief Interventions/Follow-up: Alcohol education/Brief advice Substance Abuse History in the last 12 months:  Yes.   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     Social History   Substance and Sexual Activity  Drug Use No    Additional Social History:  Allergies:   Allergies  Allergen Reactions   Latex Shortness Of Breath, Swelling and Rash   Lab Results:  Results for orders placed or performed during the hospital encounter of 06/23/22 (from the past 48 hour(s))  Resp Panel by RT-PCR (Flu A&B, Covid) Anterior Nasal Swab     Status: None   Collection Time: 06/28/22  2:06 PM   Specimen: Anterior Nasal Swab  Result Value Ref Range   SARS Coronavirus 2 by RT PCR NEGATIVE NEGATIVE    Comment: (NOTE) SARS-CoV-2 target nucleic acids are NOT DETECTED.  The SARS-CoV-2 RNA is generally detectable in upper respiratory specimens during the acute phase of infection. The lowest concentration of SARS-CoV-2 viral copies this assay can detect is 138 copies/mL. A negative result does not preclude SARS-Cov-2 infection and should not be used as the sole basis for treatment or other patient management decisions. A negative  result may occur with  improper specimen collection/handling, submission of specimen other than nasopharyngeal swab, presence of viral mutation(s) within the areas targeted by this assay, and inadequate number of viral copies(<138 copies/mL). A negative result must be combined with clinical observations, patient history, and epidemiological information. The expected result is Negative.  Fact Sheet for Patients:  EntrepreneurPulse.com.au  Fact Sheet for Healthcare Providers:  IncredibleEmployment.be  This test is no t yet approved or cleared by the [place] FDA and  has been authorized for detection and/or diagnosis of SARS-CoV-2 by FDA under an Emergency Use Authorization (EUA). This EUA will remain  in effect (meaning this test can  be used) for the duration of the COVID-19 declaration under Section 564(b)(1) of the Act, 21 U.S.C.section 360bbb-3(b)(1), unless the authorization is terminated  or revoked sooner.       Influenza A by PCR NEGATIVE NEGATIVE   Influenza B by PCR NEGATIVE NEGATIVE    Comment: (NOTE) The Xpert Xpress SARS-CoV-2/FLU/RSV plus assay is intended as an aid in the diagnosis of influenza from Nasopharyngeal swab specimens and should not be used as a sole basis for treatment. Nasal washings and aspirates are unacceptable for Xpert Xpress SARS-CoV-2/FLU/RSV testing.  Fact Sheet for Patients: EntrepreneurPulse.com.au  Fact Sheet for Healthcare Providers: IncredibleEmployment.be  This test is not yet approved or cleared by the [place] FDA and has been authorized for detection and/or diagnosis of SARS-CoV-2 by FDA under an Emergency Use Authorization (EUA). This EUA will remain in effect (meaning this test can be used) for the duration of the COVID-19 declaration under Section 564(b)(1) of the Act, 21 U.S.C. section 360bbb-3(b)(1), unless the authorization is terminated or revoked.  Performed at [facility], [place] [address] [place], [place] [address]     Blood Alcohol level:  Lab Results  Component Value Date   ETH 233 (H) 06/23/2022   ETH 250 (H) 05/15/9484    Metabolic Disorder Labs:  Lab Results  Component Value Date   HGBA1C 5.3 10/27/2021   MPG 105.41 10/27/2021   MPG 102.54 01/19/2021   No results found for: "PROLACTIN" Lab Results  Component Value Date   CHOL 296 (H) 10/27/2021   TRIG 152 (H) 10/27/2021   HDL 91 10/27/2021   CHOLHDL 3.3 10/27/2021   VLDL 30 10/27/2021   LDLCALC 175 (H) 10/27/2021   LDLCALC 185 (H) 09/27/2021    Current Medications: Current Facility-Administered Medications  Medication Dose Route Frequency Provider Last Rate Last Admin   acetaminophen (TYLENOL) tablet 650 mg  650 mg Oral Q6H PRN  Suella Broad, FNP   650 mg at 06/29/22 2129   alum & mag hydroxide-simeth (MAALOX/MYLANTA) 200-200-20 MG/5ML suspension 30 mL  30 mL Oral Q4H PRN Starkes-Perry, Gayland Curry, FNP       docusate (COLACE) 50 MG/5ML liquid 100 mg  100 mg Oral BID PRN Suella Broad, FNP       DULoxetine (CYMBALTA) DR capsule 30 mg  30 mg Oral Daily Suella Broad, FNP   30 mg at 06/29/22 0805   DULoxetine (CYMBALTA) DR capsule 60 mg  60 mg Oral Daily Suella Broad, FNP   60 mg at 46/27/03 5009   folic acid (FOLVITE) tablet 1 mg  1 mg Oral Daily Suella Broad, FNP   1 mg at 06/29/22 0805   gabapentin (NEURONTIN) capsule 100 mg  100 mg Oral BID WC Suella Broad, FNP   100 mg at 06/29/22 1211   gabapentin (NEURONTIN)  capsule 200 mg  200 mg Oral QHS Suella Broad, FNP   200 mg at 06/29/22 2126   magnesium hydroxide (MILK OF MAGNESIA) suspension 30 mL  30 mL Oral Daily PRN Suella Broad, FNP       thiamine (VITAMIN B1) tablet 100 mg  100 mg Oral Daily Suella Broad, FNP   100 mg at 06/29/22 0806   traZODone (DESYREL) tablet 50 mg  50 mg Oral QHS PRN Suella Broad, FNP   50 mg at 06/29/22 2126   triamcinolone cream (KENALOG) 0.1 % cream   Topical TID PRN Suella Broad, FNP   Given at 06/29/22 1309   PTA Medications: Medications Prior to Admission  Medication Sig Dispense Refill Last Dose   cetirizine (ZYRTEC) 10 MG tablet Take 10 mg by mouth daily.      DULoxetine (CYMBALTA) 30 MG capsule Take 1 capsule (30 mg total) by mouth daily. Take with a 60 mg capsule to equal total daily dose of 90 mg 90 capsule 1    DULoxetine (CYMBALTA) 60 MG capsule Take 1 capsule (60 mg total) by mouth daily. Take with a 30 mg capsule to equal total daily dose of 90 mg. 90 capsule 1    estradiol-norethindrone (COMBIPATCH) 0.05-0.14 MG/DAY Place 1 patch onto the skin once a week.      gabapentin (NEURONTIN) 100 MG capsule Take 100 mg in the morning and  100 mg at noon, then take (2) capsules 200 mg in the evening. (Patient taking differently: Take 100-200 mg by mouth See admin instructions. Take 100 mg in the morning and 100 mg at noon, then take 200 mg in the evening.) 360 capsule 1    Multiple Vitamin (MULTIVITAMIN) tablet Take 1 tablet by mouth daily.      traZODone (DESYREL) 50 MG tablet Take 1-2 tabs at bedtime as needed for insomnia 180 tablet 1    triamcinolone cream (KENALOG) 0.1 % Apply topically 3 (three) times daily as needed (itching). 30 g 0     Musculoskeletal: Strength & Muscle Tone: within normal limits Gait & Station: normal Patient leans: N/A    Psychiatric Specialty Exam:  Presentation  General Appearance: Appropriate for Environment; Casual    Eye Contact:Good    Speech:Clear and Coherent; Normal Rate    Speech Volume:Normal    Handedness:Right    Mood and Affect  Mood:Anxious; Depressed; Hopeless    Affect:Congruent; Depressed; Tearful     Thought Process  Thought Processes:Coherent; Linear    Duration of Psychotic Symptoms: No data recorded  Past Diagnosis of Schizophrenia or Psychoactive disorder: No   Descriptions of Associations:Intact    Orientation:Full (Time, Place and Person)    Thought Content:Logical    Hallucinations:Hallucinations: None    Ideas of Reference:None    Suicidal Thoughts:Suicidal Thoughts: Yes, Passive SI Active Intent and/or Plan: Without Intent; With Plan    Homicidal Thoughts:Homicidal Thoughts: No     Sensorium  Memory:Immediate Good; Recent Good    Judgment:Fair    Insight:Fair     Executive Functions  Concentration:Good    Attention Span:Good    Recall:Good    Fund of Knowledge:Good    Language:Good     Psychomotor Activity  Psychomotor Activity:Psychomotor Activity: Normal     Assets  Assets:Communication Skills; Desire for Improvement; Financial Resources/Insurance; Housing;  Resilience; Social Support; Vocational/Educational     Sleep  Sleep:Sleep: Fair      Physical Exam: Physical Exam Vitals reviewed.  Constitutional:  General: She is not in acute distress.    Appearance: She is normal weight. She is not toxic-appearing.  HENT:     Head: Normocephalic and atraumatic.     Mouth/Throat:     Mouth: Mucous membranes are moist.     Pharynx: Oropharynx is clear.  Abdominal:     General: Abdomen is flat. There is no distension.     Palpations: Abdomen is soft.     Tenderness: There is no abdominal tenderness.     Comments: Bruising to RLQ, which is suspected to be due to mechanical trauma from fall or in transit via EMS.   Skin:    General: Skin is warm and dry.  Neurological:     General: No focal deficit present.     Mental Status: She is alert.     Motor: No weakness.    Review of Systems  Constitutional:  Negative for malaise/fatigue.  Respiratory:  Negative for shortness of breath.   Cardiovascular:  Negative for chest pain.  Gastrointestinal:  Negative for abdominal pain, constipation, diarrhea, nausea and vomiting.  Genitourinary: Negative.   Musculoskeletal:  Negative for falls.  Neurological:  Negative for dizziness, seizures, weakness and headaches.   Blood pressure 117/87, pulse 69, temperature 98.4 F (36.9 C), temperature source Oral, resp. rate 18, height '5\' 4"'$  (1.626 m), weight 61.1 kg, SpO2 100 %. Body mass index is 23.14 kg/m.   ASSESSMENT: Principal Problem:   Bipolar 1 disorder, depressed, moderate ([place]) Active Problems:   Alcohol abuse   Generalized anxiety disorder   BHH day 1.   Treatment Plan Summary: Daily contact with patient to assess and evaluate symptoms and progress in treatment and Medication management  Physician Treatment Plan for Primary Diagnosis: Alcohol-induced mood disorder ([place]) Long Term Goal(s): Improvement in symptoms so as ready for discharge  Short Term Goals: Ability to identify  changes in lifestyle to reduce recurrence of condition will improve, Ability to verbalize feelings will improve, Ability to disclose and discuss suicidal ideas, Ability to demonstrate self-control will improve, Ability to identify and develop effective coping behaviors will improve, and Compliance with prescribed medications will improve  Physician Treatment Plan for Secondary Diagnosis: Principal Problem:   Alcohol-induced mood disorder ([place])   Long Term Goal(s): Improvement in symptoms so as ready for discharge  Short Term Goals: Ability to identify changes in lifestyle to reduce recurrence of condition will improve, Ability to verbalize feelings will improve, Ability to disclose and discuss suicidal ideas, Ability to demonstrate self-control will improve, Ability to identify and develop effective coping behaviors will improve, Ability to maintain clinical measurements within normal limits will improve, Compliance with prescribed medications will improve, and Ability to identify triggers associated with substance abuse/mental health issues will improve  I certify that inpatient services furnished can reasonably be expected to improve the patient's condition.    Assessment:  Diagnoses / Active Problems:  Safety and Monitoring: INVOLUTARILY ( ) admission to inpatient psychiatric unit for safety, stabilization and treatment Daily contact with patient to assess and evaluate symptoms and progress in treatment Patient's case to be discussed in multi-disciplinary team meeting Observation Level : q15 minute checks Vital signs: q12 hours Precautions: suicide, elopement, and assault  2. Psychiatric Diagnoses and Treatment #Bipolar 1 Disorder, current episode depressed #Cluster B personality traits -Will start mood stabilizing agent for patient, consider Trileptal -Will decrease Cymbalta -Recommend therapy, DBT or CBT (if options limited) upon discharge; patient agreeable.     PRN: Trazodone  50 mg PRN insomnia.  --  The risks/benefits/side-effects/alternatives to this medication were discussed in detail with the patient and time was given for questions. The patient consents to medication trial.              -- Metabolic profile and EKG monitoring obtained while on an atypical antipsychotic  BMI: 23.14 Lipid Panel: pending HbgA1c: pending QTc: 427             -- Encouraged patient to participate in unit milieu and in scheduled group therapies     3. Medical Issues Being Addressed:  #Alcohol Use Disorder -Will restart Naltrexone 50 mg -CIWA protocol for monitoring of withdrawal with po thiamine and MVI replacement and Ativan '1mg'$  for scores >10   4. Discharge Planning:              -- Social work and case management to assist with discharge planning and identification of hospital follow-up needs prior to discharge             -- Estimated LOS: 5-7 days             -- Discharge Concerns: Need to establish a safety plan; Medication compliance and effectiveness             -- Discharge Goals: Return home with outpatient referrals for mental health follow-up including medication management/psychotherapy    Rosezetta Schlatter, MD 8/11/20232:53 AM

## 2022-06-29 NOTE — Progress Notes (Signed)
Patient ID: Debra Rhodes, female   DOB: Jun 21, 1961, 61 y.o.   MRN: [id number] Patient presented involuntarily after overdosing on medications (Trazodone, Gabapentin and Xanax). Was intubated and stabilized at the ED prior to coming to this facility. Patient reports that she has been struggling with addictions (ETOH) and unable to quit drinking, which leads to increased depression and SI. Reports that she drinks up to 2 bottles of wine a day. Currently lives alone but daughters are supportive. Reports that her drinking affects her  daily living and she is willing to go to a long term treatment program. Skin assessment completed and patient was oriented to the unit. Safety precautions initiated.

## 2022-06-29 NOTE — [facility] Group Notes (Signed)
The focus of this group is to help patients review their daily goal of treatment and discuss progress on daily workbooks. Pt was attentive and supportive during tonight's wrap up group. Pt was able to share that she is here due to depression. Pt stated was able to talk with staff today to express concern about depression along with write positive self affirmations/gratitude.

## 2022-06-30 ENCOUNTER — Encounter (HOSPITAL_COMMUNITY): Payer: Self-pay

## 2022-06-30 DIAGNOSIS — F332 Major depressive disorder, recurrent severe without psychotic features: Secondary | ICD-10-CM | POA: Diagnosis not present

## 2022-06-30 LAB — LIPID PANEL
Cholesterol: 264 mg/dL — ABNORMAL HIGH (ref 0–200)
HDL: 101 mg/dL (ref >40)
LDL Cholesterol: 150 mg/dL — ABNORMAL HIGH (ref 0–99)
Total CHOL/HDL Ratio: 2.6 RATIO
Triglycerides: 63 mg/dL (ref <150)
VLDL: 13 mg/dL (ref 0–40)

## 2022-06-30 LAB — HEMOGLOBIN A1C
Hgb A1c MFr Bld: 5 % (ref 4.8–5.6)
Mean Plasma Glucose: 96.8 mg/dL

## 2022-06-30 LAB — TSH: TSH: 2.255 u[IU]/mL (ref 0.350–4.500)

## 2022-06-30 MED ORDER — TRAZODONE HCL 50 MG PO TABS
50.0000 mg | ORAL_TABLET | Freq: Every day | ORAL | Status: DC
  Filled 2022-06-30: qty 2

## 2022-06-30 MED ORDER — OXCARBAZEPINE 150 MG PO TABS
150.0000 mg | ORAL_TABLET | Freq: Two times a day (BID) | ORAL | Status: DC
  Administered 2022-06-30 – 2022-07-02 (×4): 150 mg via ORAL
  Filled 2022-06-30 (×8): qty 1

## 2022-06-30 MED ORDER — TRAZODONE HCL 50 MG PO TABS
50.0000 mg | ORAL_TABLET | Freq: Every evening | ORAL | Status: DC | PRN
  Administered 2022-07-01 – 2022-07-04 (×2): 50 mg via ORAL
  Filled 2022-06-30 (×2): qty 1

## 2022-06-30 MED ORDER — LORATADINE 10 MG PO TABS
10.0000 mg | ORAL_TABLET | Freq: Every day | ORAL | Status: DC | PRN
  Administered 2022-06-30 – 2022-07-05 (×4): 10 mg via ORAL
  Filled 2022-06-30 (×4): qty 1

## 2022-06-30 MED ORDER — TRAZODONE HCL 50 MG PO TABS
50.0000 mg | ORAL_TABLET | Freq: Every day | ORAL | Status: DC
  Administered 2022-06-30 – 2022-07-01 (×2): 100 mg via ORAL
  Administered 2022-07-02: 50 mg via ORAL
  Administered 2022-07-03 – 2022-07-05 (×3): 100 mg via ORAL
  Filled 2022-06-30 (×5): qty 2
  Filled 2022-06-30: qty 1
  Filled 2022-06-30 (×3): qty 2
  Filled 2022-06-30: qty 1
  Filled 2022-06-30: qty 2

## 2022-06-30 MED ORDER — NALTREXONE HCL 50 MG PO TABS
50.0000 mg | ORAL_TABLET | Freq: Every day | ORAL | Status: DC
  Administered 2022-06-30 – 2022-07-01 (×2): 50 mg via ORAL
  Filled 2022-06-30 (×6): qty 1

## 2022-06-30 NOTE — Plan of Care (Signed)
  Problem: Education: Goal: Knowledge of [facility] Education information/materials will improve Outcome: Progressing Goal: Emotional status will improve Outcome: Progressing Goal: Mental status will improve Outcome: Progressing   Problem: Activity: Goal: Interest or engagement in activities will improve Outcome: Progressing   Problem: Coping: Goal: Ability to demonstrate self-control will improve Outcome: Progressing   Problem: Health Behavior/Discharge Planning: Goal: Identification of resources available to assist in meeting health care needs will improve Outcome: Progressing   Problem: Education: Goal: Utilization of techniques to improve thought processes will improve Outcome: Progressing   Problem: Activity: Goal: Interest or engagement in leisure activities will improve Outcome: Progressing   Problem: Coping: Goal: Coping ability will improve Outcome: Progressing   Problem: Education: Goal: Ability to state activities that reduce stress will improve Outcome: Progressing   Problem: Education: Goal: Knowledge of disease or condition will improve Outcome: Progressing Goal: Understanding of discharge needs will improve Outcome: Progressing   Problem: Physical Regulation: Goal: Complications related to the disease process, condition or treatment will be avoided or minimized Outcome: Progressing

## 2022-06-30 NOTE — Progress Notes (Signed)
   06/30/22 2000  Psych Admission Type (Psych Patients Only)  Admission Status Involuntary  Psychosocial Assessment  Patient Complaints Anxiety  Eye Contact Fair  Facial Expression Anxious  Affect Anxious  Speech Soft  Interaction Assertive  Motor Activity Slow  Appearance/Hygiene Improved  Behavior Characteristics Cooperative  Mood Depressed  Aggressive Behavior  Effect No apparent injury  Thought Process  Coherency WDL  Content WDL  Delusions WDL  Perception WDL  Hallucination None reported or observed  Judgment WDL  Confusion None  Danger to Self  Current suicidal ideation? Denies  Danger to Others  Danger to Others None reported or observed

## 2022-06-30 NOTE — BH IP Treatment Plan (Signed)
Interdisciplinary Treatment and Diagnostic Plan Update  06/30/2022 Time of Session: 0830 Debra Rhodes MRN: [id number]  Principal Diagnosis: Bipolar 1 disorder, depressed, moderate ([place])  Secondary Diagnoses: Principal Problem:   Bipolar 1 disorder, depressed, moderate ([place]) Active Problems:   Alcohol abuse   Generalized anxiety disorder   Current Medications:  Current Facility-Administered Medications  Medication Dose Route Frequency Provider Last Rate Last Admin   acetaminophen (TYLENOL) tablet 650 mg  650 mg Oral Q6H PRN Suella Broad, FNP   650 mg at 06/29/22 2129   alum & mag hydroxide-simeth (MAALOX/MYLANTA) 200-200-20 MG/5ML suspension 30 mL  30 mL Oral Q4H PRN Starkes-Perry, Gayland Curry, FNP       docusate (COLACE) 50 MG/5ML liquid 100 mg  100 mg Oral BID PRN Suella Broad, FNP       DULoxetine (CYMBALTA) DR capsule 30 mg  30 mg Oral Daily Suella Broad, FNP   30 mg at 06/30/22 0738   DULoxetine (CYMBALTA) DR capsule 60 mg  60 mg Oral Daily Suella Broad, FNP   60 mg at 39/76/73 4193   folic acid (FOLVITE) tablet 1 mg  1 mg Oral Daily Suella Broad, FNP   1 mg at 06/30/22 0739   gabapentin (NEURONTIN) capsule 100 mg  100 mg Oral BID WC Suella Broad, FNP   100 mg at 06/30/22 0739   gabapentin (NEURONTIN) capsule 200 mg  200 mg Oral QHS Suella Broad, FNP   200 mg at 06/29/22 2126   magnesium hydroxide (MILK OF MAGNESIA) suspension 30 mL  30 mL Oral Daily PRN Suella Broad, FNP       thiamine (VITAMIN B1) tablet 100 mg  100 mg Oral Daily Suella Broad, FNP   100 mg at 06/30/22 0739   traZODone (DESYREL) tablet 50 mg  50 mg Oral QHS PRN Suella Broad, FNP   50 mg at 06/29/22 2126   triamcinolone cream (KENALOG) 0.1 % cream   Topical TID PRN Suella Broad, FNP   Given at 06/29/22 1309   PTA Medications: Medications Prior to Admission  Medication Sig Dispense Refill Last Dose    cetirizine (ZYRTEC) 10 MG tablet Take 10 mg by mouth daily.      DULoxetine (CYMBALTA) 30 MG capsule Take 1 capsule (30 mg total) by mouth daily. Take with a 60 mg capsule to equal total daily dose of 90 mg 90 capsule 1    DULoxetine (CYMBALTA) 60 MG capsule Take 1 capsule (60 mg total) by mouth daily. Take with a 30 mg capsule to equal total daily dose of 90 mg. 90 capsule 1    estradiol-norethindrone (COMBIPATCH) 0.05-0.14 MG/DAY Place 1 patch onto the skin once a week.      gabapentin (NEURONTIN) 100 MG capsule Take 100 mg in the morning and 100 mg at noon, then take (2) capsules 200 mg in the evening. (Patient taking differently: Take 100-200 mg by mouth See admin instructions. Take 100 mg in the morning and 100 mg at noon, then take 200 mg in the evening.) 360 capsule 1    Multiple Vitamin (MULTIVITAMIN) tablet Take 1 tablet by mouth daily.      traZODone (DESYREL) 50 MG tablet Take 1-2 tabs at bedtime as needed for insomnia 180 tablet 1    triamcinolone cream (KENALOG) 0.1 % Apply topically 3 (three) times daily as needed (itching). 30 g 0     Patient Stressors: Health problems   Occupational concerns  Substance abuse    Patient Strengths: Ability for insight  Average or above average intelligence  Communication skills  General fund of knowledge   Treatment Modalities: Medication Management, Group therapy, Case management,  1 to 1 session with clinician, Psychoeducation, Recreational therapy.   Physician Treatment Plan for Primary Diagnosis: Bipolar 1 disorder, depressed, moderate ([place]) Long Term Goal(s): Improvement in symptoms so as ready for discharge   Short Term Goals: Ability to identify changes in lifestyle to reduce recurrence of condition will improve Ability to verbalize feelings will improve Ability to disclose and discuss suicidal ideas Ability to demonstrate self-control will improve Ability to identify and develop effective coping behaviors will improve Ability to  maintain clinical measurements within normal limits will improve Compliance with prescribed medications will improve Ability to identify triggers associated with substance abuse/mental health issues will improve  Medication Management: Evaluate patient's response, side effects, and tolerance of medication regimen.  Therapeutic Interventions: 1 to 1 sessions, Unit Group sessions and Medication administration.  Evaluation of Outcomes: Progressing  Physician Treatment Plan for Secondary Diagnosis: Principal Problem:   Bipolar 1 disorder, depressed, moderate ([place]) Active Problems:   Alcohol abuse   Generalized anxiety disorder  Long Term Goal(s): Improvement in symptoms so as ready for discharge   Short Term Goals: Ability to identify changes in lifestyle to reduce recurrence of condition will improve Ability to verbalize feelings will improve Ability to disclose and discuss suicidal ideas Ability to demonstrate self-control will improve Ability to identify and develop effective coping behaviors will improve Ability to maintain clinical measurements within normal limits will improve Compliance with prescribed medications will improve Ability to identify triggers associated with substance abuse/mental health issues will improve     Medication Management: Evaluate patient's response, side effects, and tolerance of medication regimen.  Therapeutic Interventions: 1 to 1 sessions, Unit Group sessions and Medication administration.  Evaluation of Outcomes: Progressing   RN Treatment Plan for Primary Diagnosis: Bipolar 1 disorder, depressed, moderate ([place]) Long Term Goal(s): Knowledge of disease and therapeutic regimen to maintain health will improve  Short Term Goals: Ability to remain free from injury will improve, Ability to verbalize frustration and anger appropriately will improve, Ability to demonstrate self-control, Ability to participate in decision making will improve, Ability to  verbalize feelings will improve, Ability to disclose and discuss suicidal ideas, Ability to identify and develop effective coping behaviors will improve, and Compliance with prescribed medications will improve  Medication Management: RN will administer medications as ordered by provider, will assess and evaluate patient's response and provide education to patient for prescribed medication. RN will report any adverse and/or side effects to prescribing provider.  Therapeutic Interventions: 1 on 1 counseling sessions, Psychoeducation, Medication administration, Evaluate responses to treatment, Monitor vital signs and CBGs as ordered, Perform/monitor CIWA, COWS, AIMS and Fall Risk screenings as ordered, Perform wound care treatments as ordered.  Evaluation of Outcomes: Progressing   LCSW Treatment Plan for Primary Diagnosis: Bipolar 1 disorder, depressed, moderate ([place]) Long Term Goal(s): Safe transition to appropriate next level of care at discharge, Engage patient in therapeutic group addressing interpersonal concerns.  Short Term Goals: Engage patient in aftercare planning with referrals and resources, Increase social support, Increase ability to appropriately verbalize feelings, Increase emotional regulation, Facilitate acceptance of mental health diagnosis and concerns, Facilitate patient progression through stages of change regarding substance use diagnoses and concerns, Identify triggers associated with mental health/substance abuse issues, and Increase skills for wellness and recovery  Therapeutic Interventions: Assess for all discharge needs,  1 to 1 time with Education officer, museum, Explore available resources and support systems, Assess for adequacy in community support network, Educate family and significant other(s) on suicide prevention, Complete Psychosocial Assessment, Interpersonal group therapy.  Evaluation of Outcomes: Progressing   Progress in Treatment: Attending groups:  Yes. Participating in groups: Yes. Taking medication as prescribed: Yes. Toleration medication: Yes. Family/Significant other contact made: No, will contact:  CSW will reach out to patient's daughter to completed SPE Patient understands diagnosis: Yes. Discussing patient identified problems/goals with staff: Yes. Medical problems stabilized or resolved: Yes. Denies suicidal/homicidal ideation: Yes. Issues/concerns per patient self-inventory: Yes. Other: none   New problem(s) identified: No, Describe:  none  New Short Term/Long Term Goal(s): Patient to work towards detox, medication management for mood stabilization; elimination of SI thoughts; development of comprehensive mental wellness/sobriety plan.  Patient Goals:  Patient states their goal for treatment is to "manage mood successfully so I have hope. . . Find a rehab and med management provider."  Discharge Plan or Barriers: No psychosocial barriers identified at this time, patient likely to discharge directly to rehab."    Reason for Continuation of Hospitalization: Depression Other; describe active alcohol use.   Estimated Length of Stay: 1-7 days    Scribe for Treatment Team: Larose Kells 06/30/2022 11:31 AM

## 2022-06-30 NOTE — Progress Notes (Signed)
Patient appears . Patient denies SI/HI/AVH. Pt is very motivated for treatment and has begun researching treatment facilities. Pt family is supportive. Pt reports poor sleep last night. Patient complied with morning medication with no reported side effects. Patient remains safe on Q40mn checks and contracts for safety.        06/30/22 1404  Psych Admission Type (Psych Patients Only)  Admission Status Involuntary  Psychosocial Assessment  Patient Complaints Anxiety  Eye Contact Fair  Facial Expression Anxious  Affect Anxious  Speech Soft  Interaction Assertive  Motor Activity Slow  Appearance/Hygiene Unremarkable  Behavior Characteristics Cooperative  Mood Depressed;Anxious  Thought Process  Coherency WDL  Content WDL  Delusions None reported or observed  Perception WDL  Hallucination None reported or observed  Judgment Impaired  Confusion None  Danger to Self  Current suicidal ideation? Denies  Agreement Not to Harm Self Yes  Description of Agreement verbal  Danger to Others  Danger to Others None reported or observed

## 2022-06-30 NOTE — BHH Suicide Risk Assessment (Signed)
Suicide Risk Assessment  Admission Assessment    [facility] Admission Suicide Risk Assessment   Nursing information obtained from:  Patient Demographic factors:  Caucasian, Living alone Current Mental Status:  NA Loss Factors:  NA Historical Factors:  Impulsivity Risk Reduction Factors:  Positive social support, Employed  Total Time spent with patient: 45 minutes Principal Problem: Bipolar 1 disorder, depressed, moderate ([place]) Diagnosis:  Principal Problem:   Bipolar 1 disorder, depressed, moderate ([place]) Active Problems:   Alcohol abuse   Generalized anxiety disorder  Subjective Data: Debra Rhodes is a 61 year old female with a past psychiatric history of 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    On Chart Review: On chart review, patient was admitted to [facility] in December 2022 and August 2022 both times for [facility] after relapsing on alcohol and receives outpatient follow-up for therapy and medication managed with [facility]. She was also admitted to [facility] from 3/1 to 01/24/2021 for a suicide attempt by cutting her wrist in the context of alcohol relapse. Patient stated she was sober for 5 months after completing a rehab stay at [facility] after she was discharged from [facility] in March. Most recently she states she was sober for 4 months after discharge from [facility] in August.  Continued Clinical Symptoms:  Alcohol Use Disorder Identification Test Final Score (AUDIT): 22 The "Alcohol Use Disorders Identification Test", Guidelines for Use in Primary Care, Second Edition.  World Pharmacologist [facility]). Score between 0-7:  no or low risk or alcohol related problems. Score between 8-15:  moderate risk of alcohol related problems. Score between 16-19:  high risk  of alcohol related problems. Score 20 or above:  warrants further diagnostic evaluation for alcohol dependence and treatment.   CLINICAL FACTORS:   Severe Anxiety and/or Agitation Bipolar Disorder:   Depressive phase Alcohol/Substance Abuse/Dependencies Personality Disorders:   Cluster B Comorbid alcohol abuse/dependence Comorbid depression Previous Psychiatric Diagnoses and Treatments   Musculoskeletal: Strength & Muscle Tone: within normal limits Gait & Station: normal Patient leans: N/A  Psychiatric Specialty Exam:  Presentation  General Appearance: Appropriate for Environment; Casual  Eye Contact:Good  Speech:Clear and Coherent; Normal Rate  Speech Volume:Normal  Handedness:Right   Mood and Affect  Mood:Anxious; Depressed; Hopeless  Affect:Congruent; Depressed; Tearful   Thought Process  Thought Processes:Coherent; Linear  Descriptions of Associations:Intact  Orientation:Full (Time, Place and Person)  Thought Content:Logical  History of Schizophrenia/Schizoaffective disorder:No  Duration of Psychotic Symptoms:No data recorded Hallucinations:Hallucinations: None  Ideas of Reference:None  Suicidal Thoughts:Suicidal Thoughts: Yes, Passive SI Active Intent and/or Plan: Without Intent; With Plan  Homicidal Thoughts:Homicidal Thoughts: No   Sensorium  Memory:Immediate Good; Recent Good  Judgment:Fair  Insight:Fair   Executive Functions  Concentration:Good  Attention Span:Good  Recall:Good  Fund of Knowledge:Good  Language:Good   Psychomotor Activity  Psychomotor Activity:Psychomotor Activity: Normal   Assets  Assets:Communication Skills; Desire for Improvement; Financial Resources/Insurance; Housing; Resilience; Social Support; Vocational/Educational   Sleep  Sleep:Sleep: Fair    Physical Exam: Vitals reviewed.  Constitutional:      General: She is not in acute distress.    Appearance: She is normal weight. She is not  toxic-appearing.  HENT:     Head: Normocephalic and atraumatic.     Mouth/Throat:     Mouth: Mucous membranes are moist.     Pharynx: Oropharynx is clear.  Abdominal:  General: Abdomen is flat. There is no distension.     Palpations: Abdomen is soft.     Tenderness: There is no abdominal tenderness.     Comments: Bruising to RLQ, which is suspected to be due to mechanical trauma from fall or in transit via EMS.   Skin:    General: Skin is warm and dry.  Neurological:     General: No focal deficit present.     Mental Status: She is alert.     Motor: No weakness.      Review of Systems  Constitutional:  Negative for malaise/fatigue.  Respiratory:  Negative for shortness of breath.   Cardiovascular:  Negative for chest pain.  Gastrointestinal:  Negative for abdominal pain, constipation, diarrhea, nausea and vomiting.  Genitourinary: Negative.   Musculoskeletal:  Negative for falls.  Neurological:  Negative for dizziness, seizures, weakness and headaches.  Blood pressure 117/87, pulse 69, temperature 98.4 F (36.9 C), temperature source Oral, resp. rate 18, height '5\' 4"'$  (1.626 m), weight 61.1 kg, SpO2 100 %. Body mass index is 23.14 kg/m.   COGNITIVE FEATURES THAT CONTRIBUTE TO RISK:  Polarized thinking    SUICIDE RISK:   Moderate:  Frequent suicidal ideation with limited intensity, and duration, some specificity in terms of plans, no associated intent, good self-control, limited dysphoria/symptomatology, some risk factors present, and identifiable protective factors, including available and accessible social support.  PLAN OF CARE:  Daily contact with patient to assess and evaluate symptoms and progress in treatment and Medication management   Physician Treatment Plan for Primary Diagnosis: Alcohol-induced mood disorder ([place]) Long Term Goal(s): Improvement in symptoms so as ready for discharge   Short Term Goals: Ability to identify changes in lifestyle to reduce  recurrence of condition will improve, Ability to verbalize feelings will improve, Ability to disclose and discuss suicidal ideas, Ability to demonstrate self-control will improve, Ability to identify and develop effective coping behaviors will improve, and Compliance with prescribed medications will improve   Physician Treatment Plan for Secondary Diagnosis: Principal Problem:   Alcohol-induced mood disorder ([place])     Long Term Goal(s): Improvement in symptoms so as ready for discharge   Short Term Goals: Ability to identify changes in lifestyle to reduce recurrence of condition will improve, Ability to verbalize feelings will improve, Ability to disclose and discuss suicidal ideas, Ability to demonstrate self-control will improve, Ability to identify and develop effective coping behaviors will improve, Ability to maintain clinical measurements within normal limits will improve, Compliance with prescribed medications will improve, and Ability to identify triggers associated with substance abuse/mental health issues will improve   I certify that inpatient services furnished can reasonably be expected to improve the patient's condition.    Assessment:   Diagnoses / Active Problems:  Principal Problem:   Bipolar 1 disorder, depressed, moderate ([facility]) Active Problems:   Alcohol abuse   Generalized anxiety disorder  Safety and Monitoring: INVOLUTARILY ( ) admission to inpatient psychiatric unit for safety, stabilization and treatment Daily contact with patient to assess and evaluate symptoms and progress in treatment Patient's case to be discussed in multi-disciplinary team meeting Observation Level : q15 minute checks Vital signs: q12 hours Precautions: suicide, elopement, and assault   2. Psychiatric Diagnoses and Treatment #Bipolar 1 Disorder, current episode depressed #Cluster B personality traits -Will start mood stabilizing agent for patient, consider Trileptal -Will decrease  Cymbalta -Recommend therapy, DBT or CBT (if options limited) upon discharge; patient agreeable.      PRN: Trazodone 50 mg  PRN insomnia.   -- The risks/benefits/side-effects/alternatives to this medication were discussed in detail with the patient and time was given for questions. The patient consents to medication trial.              -- Metabolic profile and EKG monitoring obtained while on an atypical antipsychotic  BMI: 23.14 Lipid Panel: pending HbgA1c: pending QTc: 427             -- Encouraged patient to participate in unit milieu and in scheduled group therapies        Rosezetta Schlatter, MD 06/30/2022, 2:54 AM

## 2022-06-30 NOTE — Group Note (Signed)
[facility] LCSW Group Therapy Note   Group Date: 06/30/2022 Start Time: 1300 End Time: 1400   Type of Therapy and Topic: Group Therapy: Avoiding Self-Sabotaging and Enabling Behaviors  Participation Level: Active  Description of Group:  In this group, patients will learn how to identify obstacles, self-sabotaging and enabling behaviors, as well as: what are they, why do we do them and what needs these behaviors meet. Discuss unhealthy relationships and how to have positive healthy boundaries with those that sabotage and enable. Explore aspects of self-sabotage and enabling in yourself and how to limit these self-destructive behaviors in everyday life.   Therapeutic Goals: 1. Patient will identify one obstacle that relates to self-sabotage and enabling behaviors 2. Patient will identify one personal self-sabotaging or enabling behavior they did prior to admission 3. Patient will state a plan to change the above identified behavior 4. Patient will demonstrate ability to communicate their needs through discussion and/or role play.    Summary of Patient Progress: Patient was present for the entirety of the group session. Patient was an active listener and participated in the topic of discussion, provided helpful advice to others, and added nuance to topic of conversation.    Therapeutic Modalities:  Cognitive Behavioral Therapy Person-Centered Therapy Motivational Interviewing    Durenda Hurt, [place]

## 2022-06-30 NOTE — Progress Notes (Signed)
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   Yesterday's recommendations per psychiatric team: -Will start mood stabilizing agent for patient, consider Trileptal -Will decrease Cymbalta Trazodone 50 mg PRN insomnia. -Will restart Naltrexone 50 mg -CIWA protocol for monitoring of withdrawal with po thiamine and MVI replacement and Ativan 39m for scores >10    On evaluation today: Patient reports improvements to her mood, but difficulty sleeping last night.  She believes that her trazodone was not sufficiently covering.  As well, her thoughts were racing and she was feeling overwhelmingly guilty about all of the stress she has caused to her family members and loved ones.  She reported thinking about where to go from here in establishing her identity.  She is provided with a sentence completion self-inventory, a worksheet on stress, and techniques for improving stress.  We discussed medication addendums, and she is agreeable to the changes.  She denies SI, HI, AVH, and paranoia today.  She reports sore throat, but denies all other somatic symptoms.  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   Procedure: TOTAL KNEE ARTHROPLASTY;  Surgeon: OParalee Cancel MD;  Location: [facility];  Service: Orthopedics;  Laterality: Right;  70 mins   Family History:  Family History  Problem Relation Age of Onset   Anxiety disorder Sister    Depression Brother    Schizophrenia Brother    Suicidality Brother    Anxiety disorder Brother    Alcohol abuse Sister    Alcohol abuse Brother    Alcohol abuse Brother    Family Psychiatric  History: See H&P Social History:  Social History   Substance and Sexual Activity  Alcohol Use Yes   Comment: reports recent relapse on alcohol after multiple 3 months stints of sobriety.     Social History   Substance and Sexual Activity  Drug Use No    Social History   Socioeconomic History   Marital status: Divorced    Spouse name: Not on file   Number of children: Not on file   Years of education: Not on file   Highest education level: Not on file  Occupational History   Not on file  Tobacco Use   Smoking status: Never   Smokeless tobacco: Never  Vaping Use   Vaping Use: Never used  Substance and Sexual Activity   Alcohol use: Yes    Comment: reports recent relapse on alcohol after multiple 3 months stints of sobriety.   Drug use: No   Sexual activity: Not Currently  Other Topics Concern   Not  on file  Social History Narrative   Not on file   Social Determinants of Health   Financial Resource Strain: Not on file  Food Insecurity: Not on file  Transportation Needs: Not on file  Physical Activity: Not on file  Stress: Not on file  Social Connections: Not on file   Additional Social History:         Sleep: Poor  Appetite:  Good  Current Medications: Current Facility-Administered Medications  Medication Dose Route Frequency Provider Last Rate Last Admin    acetaminophen (TYLENOL) tablet 650 mg  650 mg Oral Q6H PRN Suella Broad, FNP   650 mg at 06/29/22 2129   alum & mag hydroxide-simeth (MAALOX/MYLANTA) 200-200-20 MG/5ML suspension 30 mL  30 mL Oral Q4H PRN Starkes-Perry, Gayland Curry, FNP       docusate (COLACE) 50 MG/5ML liquid 100 mg  100 mg Oral BID PRN Suella Broad, FNP       DULoxetine (CYMBALTA) DR capsule 30 mg  30 mg Oral Daily Suella Broad, FNP   30 mg at 06/29/22 0805   DULoxetine (CYMBALTA) DR capsule 60 mg  60 mg Oral Daily Suella Broad, FNP   60 mg at 16/10/96 0454   folic acid (FOLVITE) tablet 1 mg  1 mg Oral Daily Suella Broad, FNP   1 mg at 06/29/22 0805   gabapentin (NEURONTIN) capsule 100 mg  100 mg Oral BID WC Suella Broad, FNP   100 mg at 06/29/22 1211   gabapentin (NEURONTIN) capsule 200 mg  200 mg Oral QHS Suella Broad, FNP   200 mg at 06/29/22 2126   magnesium hydroxide (MILK OF MAGNESIA) suspension 30 mL  30 mL Oral Daily PRN Suella Broad, FNP       thiamine (VITAMIN B1) tablet 100 mg  100 mg Oral Daily Suella Broad, FNP   100 mg at 06/29/22 0806   traZODone (DESYREL) tablet 50 mg  50 mg Oral QHS PRN Suella Broad, FNP   50 mg at 06/29/22 2126   triamcinolone cream (KENALOG) 0.1 % cream   Topical TID PRN Suella Broad, FNP   Given at 06/29/22 1309    Lab Results:  Results for orders placed or performed during the hospital encounter of 06/23/22 (from the past 48 hour(s))  Resp Panel by RT-PCR (Flu A&B, Covid) Anterior Nasal Swab     Status: None   Collection Time: 06/28/22  2:06 PM   Specimen: Anterior Nasal Swab  Result Value Ref Range   SARS Coronavirus 2 by RT PCR NEGATIVE NEGATIVE    Comment: (NOTE) SARS-CoV-2 target nucleic acids are NOT DETECTED.  The SARS-CoV-2 RNA is generally detectable in upper respiratory specimens during the acute phase of infection. The lowest concentration of SARS-CoV-2 viral copies this  assay can detect is 138 copies/mL. A negative result does not preclude SARS-Cov-2 infection and should not be used as the sole basis for treatment or other patient management decisions. A negative result may occur with  improper specimen collection/handling, submission of specimen other than nasopharyngeal swab, presence of viral mutation(s) within the areas targeted by this assay, and inadequate number of viral copies(<138 copies/mL). A negative result must be combined with clinical observations, patient history, and epidemiological information. The expected result is Negative.  Fact Sheet for Patients:  EntrepreneurPulse.com.au  Fact Sheet for Healthcare Providers:  IncredibleEmployment.be  This test is no t yet approved or cleared by the [place] and  has been authorized for detection and/or diagnosis of SARS-CoV-2 by FDA under an Emergency Use Authorization (EUA). This EUA will remain  in effect (meaning this test can be used) for the duration of the COVID-19 declaration under Section 564(b)(1) of the Act, 21 U.S.C.section 360bbb-3(b)(1), unless the authorization is terminated  or revoked sooner.       Influenza A by PCR NEGATIVE NEGATIVE   Influenza B by PCR NEGATIVE NEGATIVE    Comment: (NOTE) The Xpert Xpress SARS-CoV-2/FLU/RSV plus assay is intended as an aid in the diagnosis of influenza from Nasopharyngeal swab specimens and should not be used as a sole basis for treatment. Nasal washings and aspirates are unacceptable for Xpert Xpress SARS-CoV-2/FLU/RSV testing.  Fact Sheet for Patients: EntrepreneurPulse.com.au  Fact Sheet for Healthcare Providers: IncredibleEmployment.be  This test is not yet approved or cleared by the [place] FDA and has been authorized for detection and/or diagnosis of SARS-CoV-2 by FDA under an Emergency Use Authorization (EUA). This EUA will remain in  effect (meaning this test can be used) for the duration of the COVID-19 declaration under Section 564(b)(1) of the Act, 21 U.S.C. section 360bbb-3(b)(1), unless the authorization is terminated or revoked.  Performed at [facility], [place] [address], [place], [place] [address]     Blood Alcohol level:  Lab Results  Component Value Date   ETH 233 (H) 06/23/2022   ETH 250 (H) 16/60/6301    Metabolic Disorder Labs: Lab Results  Component Value Date   HGBA1C 5.3 10/27/2021   MPG 105.41 10/27/2021   MPG 102.54 01/19/2021   No results found for: "PROLACTIN" Lab Results  Component Value Date   CHOL 296 (H) 10/27/2021   TRIG 152 (H) 10/27/2021   HDL 91 10/27/2021   CHOLHDL 3.3 10/27/2021   VLDL 30 10/27/2021   LDLCALC 175 (H) 10/27/2021   LDLCALC 185 (H) 09/27/2021    Physical Findings: CIWA:  CIWA-Ar Total: 4  Musculoskeletal: Strength & Muscle Tone: within normal limits Gait & Station: normal Patient leans: N/A  Psychiatric Specialty Exam:  Presentation  General Appearance: Appropriate for Environment; Casual   Eye Contact:Good   Speech:Clear and Coherent; Normal Rate   Speech Volume:Normal   Handedness:Right    Mood and Affect  Mood:Anxious; Depressed; Hopeless   Affect:Congruent; Depressed; Tearful    Thought Process  Thought Processes:Coherent; Linear   Descriptions of Associations:Intact   Orientation:Full (Time, Place and Person)   Thought Content:Logical   History of Schizophrenia/Schizoaffective disorder:No   Duration of Psychotic Symptoms:No data recorded  Hallucinations:Hallucinations: None  Ideas of Reference:None   Suicidal Thoughts:Suicidal Thoughts: Yes, Passive SI Active Intent and/or Plan: Without Intent; With Plan  Homicidal Thoughts:Homicidal Thoughts: No   Sensorium  Memory:Immediate Good; Recent Good   Judgment:Fair   Insight:Fair    Executive Functions  Concentration:Good   Attention  Span:Good   Recall:Good   Fund of Knowledge:Good   Language:Good    Psychomotor Activity  Psychomotor Activity:Psychomotor Activity: Normal   Assets  Assets:Communication Skills; Desire for Improvement; Financial Resources/Insurance; Housing; Resilience; Social Support; Vocational/Educational    Sleep  Sleep:Sleep: Fair    Physical Exam: Physical Exam Vitals reviewed.  Constitutional:      General: She is not in acute distress.    Appearance: She is not toxic-appearing.  HENT:     Head: Normocephalic and atraumatic.     Mouth/Throat:     Mouth: Mucous membranes are moist.     Pharynx: Oropharynx is clear.  Pulmonary:  Effort: Pulmonary effort is normal.  Skin:    General: Skin is warm and dry.  Neurological:     General: No focal deficit present.     Mental Status: She is alert and oriented to person, place, and time. Mental status is at baseline.     Motor: No weakness.     Gait: Gait normal.    Review of Systems  Constitutional:  Negative for diaphoresis.  HENT:  Positive for sore throat.   Respiratory:  Negative for shortness of breath.   Cardiovascular:  Negative for chest pain.  Gastrointestinal: Negative.   Genitourinary: Negative.   Musculoskeletal: Negative.   Neurological:  Negative for dizziness, tremors and headaches.   Blood pressure 100/78, pulse 71, temperature 97.9 F (36.6 C), temperature source Oral, resp. rate 18, height 5' 4" (1.626 m), weight 61.1 kg, SpO2 100 %. Body mass index is 23.14 kg/m.   Treatment Plan Summary: ASSESSMENT: Principal Problem:   Bipolar 1 disorder, depressed, moderate ([place]) Active Problems:   Alcohol abuse   Generalized anxiety disorder       PLAN: Safety and Monitoring:             -- Involuntary admission to inpatient psychiatric unit for safety, stabilization and treatment             -- Daily contact with patient to assess and evaluate symptoms and progress in treatment             --  Patient's case to be discussed in multi-disciplinary team meeting             -- Observation Level : q15 minute checks             -- Vital signs:  q12 hours             -- Precautions: suicide, elopement, and assault   2. Psychiatric Diagnoses and Treatment:  #Bipolar 1 Disorder, current episode depressed #Cluster B personality traits -Start Trileptal 150 mg twice daily  -Na 136, ALT/AST 21/36, alk phos 52 -Discontinue home Cymbalta; will add on additional antidepressant agent as tolerated -Start trazodone 50 to 100 mg nightly for insomnia -Recommend therapy, DBT or CBT (if options limited) upon discharge; patient agreeable.     PRN: Additional trazodone 50 mg PRN insomnia.   -- The risks/benefits/side-effects/alternatives to this medication were discussed in detail with the patient and time was given for questions. The patient consents to medication trial.              -- Metabolic profile and EKG monitoring obtained while on an atypical antipsychotic  BMI: 23.14 Lipid Panel: pending HbgA1c: pending QTc: 427             -- Encouraged patient to participate in unit milieu and in scheduled group therapies    3. Medical Diagnoses and Treatment: #Alcohol Use Disorder -Start naltrexone 50 mg - Discontinue home gabapentin -CIWA protocol for monitoring of withdrawal with po thiamine and MVI replacement and Ativan 54m for scores >10       4. Discharge Planning:              -- Social work and case management to assist with discharge planning and identification of hospital follow-up needs prior to discharge             -- Estimated LOS 5 to 7 days             -- Discharge Concerns: Need to establish a safety plan             --  Discharge Goals: Return home with outpatient referrals for mental health follow-up including medication management/psychotherapy    Rosezetta Schlatter, MD 06/30/2022, 7:29 AM

## 2022-06-30 NOTE — Group Note (Signed)
Date:  06/30/2022 Time:  9:53 AM  Group Topic/Focus:  Orientation:   The focus of this group is to educate the patient on the purpose and policies of crisis stabilization and provide a format to answer questions about their admission.  The group details unit policies and expectations of patients while admitted.    Participation Level:  Active  Participation Quality:  Appropriate  Affect:  Appropriate  Cognitive:  Appropriate  Insight: Appropriate  Engagement in Group:  Engaged  Modes of Intervention:  Discussion  Additional Comments:     Jerrye Beavers 06/30/2022, 9:53 AM

## 2022-06-30 NOTE — [facility] Group Notes (Signed)
Adult Psychoeducational Group Note  Date:  06/30/2022 Time:  2:26 PM  Group Topic/Focus:  Managing Feelings:   The focus of this group is to identify what feelings patients have difficulty handling and develop a plan to handle them in a healthier way upon discharge.  Participation Level:  Active  Participation Quality:  Attentive  Affect:  Appropriate  Cognitive:  Alert  Insight: Appropriate  Engagement in Group:  Engaged  Modes of Intervention:  Activity  Additional Comments:  Patient attended and participated in the emotional regulation group activity.  Annie Sable 06/30/2022, 2:26 PM

## 2022-07-01 DIAGNOSIS — F3132 Bipolar disorder, current episode depressed, moderate: Secondary | ICD-10-CM | POA: Diagnosis not present

## 2022-07-01 MED ORDER — NALTREXONE HCL 50 MG PO TABS
50.0000 mg | ORAL_TABLET | Freq: Every day | ORAL | Status: DC
Start: 2022-07-02 — End: 2022-07-06
  Administered 2022-07-02 – 2022-07-05 (×4): 50 mg via ORAL
  Filled 2022-07-01: qty 1
  Filled 2022-07-01: qty 7
  Filled 2022-07-01 (×4): qty 1

## 2022-07-01 NOTE — Progress Notes (Signed)
D. Pt presented as friendly, smiling, but complained of poor sleep last night due to "noise in the hall."Per pt's self inventory, pt rated her depression,hopelessness and anxiety a 4/5/6, respectively. Pt wrote that her goal was "to get some sleep, work on post discharge plan and homework sheets." Pt has been visible in the milieu interacting well with peers and staff.  Pt currently denies SI/HI and AVH   A. Labs and vitals monitored. Pt given and educated on medications. Pt supported emotionally and encouraged to express concerns and ask questions.   R. Pt remains safe with 15 minute checks. Will continue POC.

## 2022-07-01 NOTE — Group Note (Signed)
Date:  07/01/2022 Time:  9:28 AM  Group Topic/Focus:  Goals Group:   The focus of this group is to help patients establish daily goals to achieve during treatment and discuss how the patient can incorporate goal setting into their daily lives to aide in recovery. Orientation:   The focus of this group is to educate the patient on the purpose and policies of crisis stabilization and provide a format to answer questions about their admission.  The group details unit policies and expectations of patients while admitted.    Participation Level:  Active  Participation Quality:  Appropriate  Affect:  Appropriate  Cognitive:  Appropriate  Insight: Appropriate  Engagement in Group:  Engaged  Modes of Intervention:  Discussion  Additional Comments:  Pt wants to maintain a good attitude and talk to Dr about discharge planning.  Garvin Fila 07/01/2022, 9:28 AM

## 2022-07-01 NOTE — [facility] Group Notes (Signed)
Psychoeducational Group Note    Date:  07/01/2022 Time: 1300-1400    Purpose of Group: . The group focus' on teaching patients on how to identify their needs and their Life Skills:  A group where two lists are made. What people need and what are things that we do that are unhealthy. The lists are developed by the patients and it is explained that we often do the actions that are not healthy to get our list of needs met.  Goal:: to develop the coping skills needed to get their needs met  Participation Level:  Active  Participation Quality:  Appropriate  Affect:  Appropriate  Cognitive:  Oriented  Insight: Improving  Engagement in Group:  Engaged  Modes of Intervention:  Activity, Discussion, Education, and Support  Additional Comments:  Rates her energy as a 6.5/10. Participated fully in the group,  Paulino Rily

## 2022-07-01 NOTE — [facility] Group Notes (Signed)
Goals Group 07/01/22   Group Focus: affirmation, clarity of thought, and goals/reality orientation Treatment Modality:  Psychoeducation Interventions utilized were assignment, group exercise, and support Purpose: To be able to understand and verbalize the reason for their admission to the hospital. To understand that the medication helps with their chemical imbalance but they also need to work on their choices in life. To be challenged to develop a list of 30 positives about themselves. Also introduce the concept that "feelings" are not reality.  Participation Level:  Active  Participation Quality:  Appropriate  Affect:  Appropriate  Cognitive:  Appropriate  Insight:  Improving  Engagement in Group:  Engaged  Additional Comments: Rates her energy at a 6.5/10,   Debra Rhodes

## 2022-07-01 NOTE — Progress Notes (Signed)
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   Yesterday's recommendations per psychiatric team: -Trileptal 150 BID -Discontinue Cymbalta Trazodone 50 mg PRN insomnia. -Start Naltrexone 50 mg -CIWA protocol for monitoring of withdrawal with po thiamine and MVI replacement and Ativan $RemoveBe'1mg'WdGvtGtOX$  for scores >10    On evaluation today: Patient reports overall improvements to her mood, but again with difficulty sleeping last night due to loud conversations outside of her room.  She reported feeling a bit more anxious today but she is unsure if this is from lack of sleep versus medication changes.  We go over the worksheets provided yesterday.  We again discussed medication addendums, and she is agreeable to the changes.  She denies SI (both active and passive), HI, AVH, and paranoia today.  She reports sore throat, but denies all other somatic symptoms.  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   Procedure: TOTAL KNEE ARTHROPLASTY;   Surgeon: Paralee Cancel, MD;  Location: [facility];  Service: Orthopedics;  Laterality: Right;  70 mins   Family History:  Family History  Problem Relation Age of Onset   Anxiety disorder Sister    Depression Brother    Schizophrenia Brother    Suicidality Brother    Anxiety disorder Brother    Alcohol abuse Sister    Alcohol abuse Brother    Alcohol abuse Brother    Family Psychiatric  History: See H&P Social History:  Social History   Substance and Sexual Activity  Alcohol Use Yes   Comment: reports recent relapse on alcohol after multiple 3 months stints of sobriety.     Social History   Substance and Sexual Activity  Drug Use No    Social History   Socioeconomic History   Marital status: Divorced    Spouse name: Not on file   Number of children: Not on file   Years of education: Not on file   Highest education level: Not on file  Occupational History   Not on file  Tobacco Use   Smoking status: Never   Smokeless tobacco: Never  Vaping Use   Vaping Use: Never used  Substance and Sexual Activity   Alcohol use: Yes    Comment: reports recent relapse on alcohol after multiple 3 months stints of sobriety.   Drug use: No   Sexual activity: Not Currently  Other Topics Concern   Not on file  Social History Narrative   Not on file   Social Determinants of Health   Financial Resource Strain: Not on file  Food Insecurity: Not on file  Transportation Needs: Not on file  Physical Activity: Not on file  Stress: Not on file  Social Connections: Not on file   Additional Social History:         Sleep: Poor  Appetite:  Good  Current Medications: Current Facility-Administered Medications  Medication Dose Route Frequency Provider Last Rate Last Admin   acetaminophen (TYLENOL) tablet 650 mg  650 mg Oral Q6H PRN Suella Broad, FNP   650 mg at 06/29/22 2129   alum & mag hydroxide-simeth (MAALOX/MYLANTA) 200-200-20 MG/5ML suspension 30 mL  30 mL Oral Q4H PRN  Starkes-Perry, Gayland Curry, FNP       docusate (COLACE) 50 MG/5ML liquid 100 mg  100 mg Oral BID PRN Starkes-Perry, Gayland Curry, FNP       folic acid (FOLVITE) tablet 1 mg  1 mg Oral Daily Suella Broad, FNP   1 mg at 07/01/22 6644   loratadine (CLARITIN) tablet 10 mg  10 mg Oral Daily PRN Rosezetta Schlatter, MD   10 mg at 06/30/22 2126   magnesium hydroxide (MILK OF MAGNESIA) suspension 30 mL  30 mL Oral Daily PRN Suella Broad, FNP       [START ON 07/02/2022] naltrexone (DEPADE) tablet 50 mg  50 mg Oral QHS Rosezetta Schlatter, MD       OXcarbazepine (TRILEPTAL) tablet 150 mg  150 mg Oral BID Rosezetta Schlatter, MD   150 mg at 07/01/22 0809   thiamine (VITAMIN B1) tablet 100 mg  100 mg Oral Daily Suella Broad, FNP   100 mg at 07/01/22 0347   traZODone (DESYREL) tablet 50-100 mg  50-100 mg Oral QHS Rosezetta Schlatter, MD   100 mg at 06/30/22 2125   And   traZODone (DESYREL) tablet 50 mg  50 mg Oral QHS PRN Rosezetta Schlatter, MD       triamcinolone cream (KENALOG) 0.1 % cream   Topical TID PRN Suella Broad, FNP   Given at 06/29/22 1309    Lab Results:  Results for orders placed or performed during the hospital encounter of 06/28/22 (from the past 48 hour(s))  TSH     Status: None   Collection Time: 06/30/22  6:53 AM  Result Value Ref Range   TSH 2.255 0.350 - 4.500 uIU/mL    Comment: Performed by a 3rd Generation assay with a functional sensitivity of <=0.01 uIU/mL. Performed at [facility] [place] [address], [place], [place] [address]   Hemoglobin A1c     Status: None   Collection Time: 06/30/22  6:53 AM  Result Value Ref Range   Hgb A1c MFr Bld 5.0 4.8 - 5.6 %    Comment: (NOTE) Pre diabetes:          5.7%-6.4%  Diabetes:              >6.4%  Glycemic control for   <7.0% adults with diabetes    Mean Plasma Glucose 96.8 mg/dL    Comment: Performed at [facility] [address], [place], [place] [address]  Lipid panel     Status: Abnormal    Collection Time: 06/30/22  6:53 AM  Result Value Ref Range   Cholesterol 264 (H) 0 - 200 mg/dL   Triglycerides 63 <150 mg/dL   HDL 101 >40 mg/dL   Total CHOL/HDL Ratio 2.6 RATIO   VLDL 13 0 - 40 mg/dL   LDL Cholesterol 150 (H) 0 - 99 mg/dL    Comment:  Total Cholesterol/HDL:CHD Risk Coronary Heart Disease Risk Table                     Men   Women  1/2 Average Risk   3.4   3.3  Average Risk       5.0   4.4  2 X Average Risk   9.6   7.1  3 X Average Risk  23.4   11.0        Use the calculated Patient Ratio above and the CHD Risk Table to determine the patient's CHD Risk.        ATP III CLASSIFICATION (LDL):  <100     mg/dL   Optimal  100-129  mg/dL   Near or Above                    Optimal  130-159  mg/dL   Borderline  160-189  mg/dL   High  >190     mg/dL   Very High Performed at [facility] [address], [place], [place] [address]     Blood Alcohol level:  Lab Results  Component Value Date   ETH 233 (H) 06/23/2022   ETH 250 (H) 23/76/2831    Metabolic Disorder Labs: Lab Results  Component Value Date   HGBA1C 5.0 06/30/2022   MPG 96.8 06/30/2022   MPG 105.41 10/27/2021   No results found for: "PROLACTIN" Lab Results  Component Value Date   CHOL 264 (H) 06/30/2022   TRIG 63 06/30/2022   HDL 101 06/30/2022   CHOLHDL 2.6 06/30/2022   VLDL 13 06/30/2022   LDLCALC 150 (H) 06/30/2022   LDLCALC 175 (H) 10/27/2021    Physical Findings: CIWA:  CIWA-Ar Total: 4  Musculoskeletal: Strength & Muscle Tone: within normal limits Gait & Station: normal Patient leans: N/A  Psychiatric Specialty Exam:  Presentation  General Appearance: Appropriate for Environment; Casual   Eye Contact:Good   Speech:Clear and Coherent; Normal Rate   Speech Volume:Normal   Handedness:Right    Mood and Affect  Mood:Anxious   Affect:Appropriate; Congruent    Thought Process  Thought Processes:Coherent; Linear   Descriptions of  Associations:Intact   Orientation:Full (Time, Place and Person)   Thought Content:Logical   History of Schizophrenia/Schizoaffective disorder:No   Duration of Psychotic Symptoms:No data recorded  Hallucinations:Hallucinations: None  Ideas of Reference:None   Suicidal Thoughts:Suicidal Thoughts: No SI Active Intent and/or Plan: Without Intent; With Plan  Homicidal Thoughts:Homicidal Thoughts: No   Sensorium  Memory:Immediate Good; Recent Good   Judgment:Fair   Insight:Fair    Executive Functions  Concentration:Good   Attention Span:Good   Recall:Good   Fund of Knowledge:Good   Language:Good    Psychomotor Activity  Psychomotor Activity:Psychomotor Activity: Normal   Assets  Assets:Communication Skills; Desire for Improvement; Financial Resources/Insurance; Housing; Resilience; Social Support; Vocational/Educational    Sleep  Sleep:Sleep: Poor    Physical Exam: Physical Exam Vitals reviewed.  Constitutional:      General: She is not in acute distress.    Appearance: She is not toxic-appearing.  HENT:     Head: Normocephalic and atraumatic.     Mouth/Throat:     Mouth: Mucous membranes are moist.     Pharynx: Oropharynx is clear.  Pulmonary:     Effort: Pulmonary effort is normal.  Skin:    General: Skin is warm and dry.  Neurological:     General: No focal deficit present.     Mental Status: She  is alert and oriented to person, place, and time. Mental status is at baseline.     Motor: No weakness.     Gait: Gait normal.    Review of Systems  Constitutional:  Negative for diaphoresis.  HENT:  Positive for sore throat.   Respiratory:  Negative for shortness of breath.   Cardiovascular:  Negative for chest pain.  Gastrointestinal: Negative.   Genitourinary: Negative.   Musculoskeletal: Negative.   Neurological:  Negative for dizziness, tremors and headaches.   Blood pressure 112/79, pulse 62, temperature 98.1 F (36.7 C),  temperature source Oral, resp. rate 18, height $RemoveBe'5\' 4"'qXBWinklH$  (1.626 m), weight 61.1 kg, SpO2 96 %. Body mass index is 23.14 kg/m.   Treatment Plan Summary: ASSESSMENT: Principal Problem:   Bipolar 1 disorder, depressed, moderate ([place]) Active Problems:   Alcohol abuse   Generalized anxiety disorder       PLAN: Safety and Monitoring:             -- Involuntary admission to inpatient psychiatric unit for safety, stabilization and treatment             -- Daily contact with patient to assess and evaluate symptoms and progress in treatment             -- Patient's case to be discussed in multi-disciplinary team meeting             -- Observation Level : q15 minute checks             -- Vital signs:  q12 hours             -- Precautions: suicide, elopement, and assault   2. Psychiatric Diagnoses and Treatment:  #Bipolar 1 Disorder, current episode depressed #Cluster B personality traits -Continue Trileptal 150 mg twice daily (started 8/11); with the plan to titrate tomorrow evening to 300 mg  -Na 136, ALT/AST 21/36, alk phos 52  -Repeat CMP on 8/15 -Discontinued home Cymbalta; will add on additional antidepressant agent as tolerated -Continue trazodone 50 to 100 mg nightly for insomnia -Recommend therapy, DBT or CBT (if options limited) upon discharge; patient agreeable.     PRN: Additional trazodone 50 mg PRN insomnia.   -- The risks/benefits/side-effects/alternatives to this medication were discussed in detail with the patient and time was given for questions. The patient consents to medication trial.              -- Metabolic profile and EKG monitoring obtained while on an atypical antipsychotic  BMI: 23.14 Lipid Panel: pending HbgA1c: pending QTc: 427             -- Encouraged patient to participate in unit milieu and in scheduled group therapies    3. Medical Diagnoses and Treatment: #Alcohol Use Disorder -Continue naltrexone 50 mg nightly - Discontinue home gabapentin -CIWA  protocol for monitoring of withdrawal with po thiamine and MVI replacement and Ativan $RemoveBe'1mg'WLktYOqUS$  for scores >10       4. Discharge Planning:              -- Social work and case management to assist with discharge planning and identification of hospital follow-up needs prior to discharge             -- Estimated LOS 5 to 7 days             -- Discharge Concerns: Need to establish a safety plan             -- Discharge Goals:  Return home with outpatient referrals for mental health follow-up including medication management/psychotherapy    Rosezetta Schlatter, MD 07/01/2022, 4:09 PM

## 2022-07-01 NOTE — Progress Notes (Signed)
Adult Psychoeducational Group Note  Date:  07/01/2022 Time:  8:59 PM  Group Topic/Focus:  Wrap-Up Group:   The focus of this group is to help patients review their daily goal of treatment and discuss progress on daily workbooks.  Participation Level:  Active  Participation Quality:  Appropriate  Affect:  Appropriate  Cognitive:  Appropriate  Insight: Appropriate  Engagement in Group:  Engaged  Modes of Intervention:  Education and Exploration  Additional Comments:  Patient attended and participated in group tonight. She reports that while she has been here she learnt that she don't have to solve all her problems today.  Salley Scarlet [facility] 07/01/2022, 8:59 PM

## 2022-07-01 NOTE — Group Note (Signed)
LCSW Group Therapy Note No social work group was held today due to newly separated halls necessitating a higher number of groups, a significant number of expected and unexpected discharges, a number of necessary Suicide Prevention Education calls to family members, and a high number of admissions that required initial psychosocial assessments.  The following was provided to each patient on 300 and 500 halls in lieu of in-person group:  Healthy vs. Unhealthy Coping Skills and Supports   Unhealthy Qualities                                             Healthy Qualities Works (at first) Works   Stops working or starts Secondary school teacher working  Fast Usually takes time to develop  Easy Often difficult to learn  Usually a habit Usually unknown, has to become a habit  Can do alone Often need to reach out for help   Leads to loss Leads to gain         My Unhealthy Coping Skills                                    My Healthy Coping Skills                       My Unhealthy Supports                                           My Healthy [place], [place] 07/01/2022  2:44 PM

## 2022-07-02 DIAGNOSIS — F3132 Bipolar disorder, current episode depressed, moderate: Secondary | ICD-10-CM | POA: Diagnosis not present

## 2022-07-02 MED ORDER — WHITE PETROLATUM EX OINT
TOPICAL_OINTMENT | CUTANEOUS | Status: AC
  Administered 2022-07-02: 1
  Filled 2022-07-02: qty 5

## 2022-07-02 MED ORDER — DULOXETINE HCL 30 MG PO CPEP
30.0000 mg | ORAL_CAPSULE | Freq: Every day | ORAL | Status: DC
Start: 2022-07-02 — End: 2022-07-03
  Administered 2022-07-02 – 2022-07-03 (×2): 30 mg via ORAL
  Filled 2022-07-02 (×4): qty 1

## 2022-07-02 MED ORDER — OXCARBAZEPINE 300 MG PO TABS
300.0000 mg | ORAL_TABLET | Freq: Two times a day (BID) | ORAL | Status: DC
  Administered 2022-07-02 – 2022-07-04 (×4): 300 mg via ORAL
  Filled 2022-07-02 (×6): qty 1

## 2022-07-02 MED ORDER — ADULT MULTIVITAMIN W/MINERALS CH
1.0000 | ORAL_TABLET | Freq: Every day | ORAL | Status: DC
  Administered 2022-07-02 – 2022-07-05 (×4): 1 via ORAL
  Filled 2022-07-02: qty 7
  Filled 2022-07-02 (×6): qty 1

## 2022-07-02 MED ORDER — CHEWING GUM (ORBIT) SUGAR FREE
1.0000 | CHEWING_GUM | Freq: Three times a day (TID) | ORAL | Status: DC
  Administered 2022-07-02 – 2022-07-03 (×5): 1 via ORAL
  Filled 2022-07-02: qty 1

## 2022-07-02 MED ORDER — DIPHENHYDRAMINE HCL 25 MG PO CAPS: 25.0000 mg | ORAL_CAPSULE | Freq: Every day | ORAL | Status: DC | PRN

## 2022-07-02 MED ORDER — DIPHENHYDRAMINE HCL 25 MG PO CAPS
ORAL_CAPSULE | ORAL | Status: AC
  Administered 2022-07-02: 25 mg
  Filled 2022-07-02: qty 1

## 2022-07-02 NOTE — Progress Notes (Signed)
Pt affect blunted, mood anxious, cooperative, brightens on approach. Pt rated her day a 6/10 and goal was to work on discharge planning. Pt rated her depression 3/10 and anxiety 5/10. Denies SI/HI or hallucinations currently. Pt received extra dosage of trazodone '50mg'$  as requested, total of '150mg'$ . Pt reported she had a hard time sleeping last night.(A) 15 min checks (r) safety maintained.

## 2022-07-02 NOTE — Progress Notes (Addendum)
Debra Rhodes. NP here to observe rash . Appears left and right waist. Raised red. Benadryl 25 mg p.o. Patient teaching. Not to take trileptal dose  in a.m until seen by M.D.

## 2022-07-02 NOTE — Progress Notes (Signed)
   07/02/22 0500  Sleep  Number of Hours 7

## 2022-07-02 NOTE — Progress Notes (Signed)
[place] Group Notes:  (Nursing/MHT/Case Management/Adjunct)  Date:  07/02/2022  Time:  2000  Type of Therapy:   wrap up group  Participation Level:  Active  Participation Quality:  Appropriate, Attentive, Sharing, and Supportive  Affect:  Appropriate  Cognitive:  Alert  Insight:  Improving  Engagement in Group:  Engaged  Modes of Intervention:  Clarification, Education, and Support  Summary of Progress/Problems: Positive thinking and positive change were discussed.   [name] 07/02/2022, 8:37 PM

## 2022-07-02 NOTE — Progress Notes (Signed)
D. Pt reported improved mood, and reported having slept 'much better' last night. Pt reported some 'brain fogginess' that she believed was from the discontinuation of Cymbalta. Pt continues to be visible in the milieu interacting well with peers, and observed attending groups Per pt's self inventory, pt rated her depression,hopelessness and anxiety a 4/3/5, respectively.  Pt currently denies SI/HI and AVH .  A. Labs and vitals monitored. Pt given and educated on medications. Pt supported emotionally and encouraged to express concerns and ask questions.   R. Pt remains safe with 15 minute checks. Will continue POC.

## 2022-07-02 NOTE — [facility] Group Notes (Signed)
Child/Adolescent Psychoeducational Group Note  Date:  07/02/2022 Time:  10:38 AM  Group Topic/Focus:  Goals Group:   The focus of this group is to help patients establish daily goals to achieve during treatment and discuss how the patient can incorporate goal setting into their daily lives to aide in recovery.  Participation Level:  Active  Participation Quality:  Appropriate  Affect:  Appropriate  Cognitive:  Appropriate  Insight:  Appropriate  Engagement in Group:  Engaged  Modes of Intervention:  Discussion  Additional Comments:  Patient attended goals group and was attentive the duration of it. Patient's goal was to improve her mood by thinking positively.   Elohim Brune T Ria Comment 07/02/2022, 10:38 AM

## 2022-07-02 NOTE — [facility] Group Notes (Signed)
Adult Psychoeducational Group  Date:  07/02/2022 Time:  1300-1400  Group Topic/Focus: Continuation of the group from Saturday. Looking at the lists that were created and talking about what needs to be done with the homework of 30 positives about themselves.                                     Talking about taking their power back and helping themselves to develop a positive self esteem.      Participation Quality:  Appropriate  Affect:  Appropriate  Cognitive:  Oriented  Insight: Improving  Engagement in Group:  Engaged  Modes of Intervention:  Activity, Discussion, Education, and Support  Additional Comments:  Rates her energy at an 8/10. Shared in the group and asked questions.  Paulino Rily

## 2022-07-02 NOTE — Progress Notes (Signed)
Patient c/o of rash of bilateral hip area that she noticed tonight after taking the increase of trileptal 150 mg to trileptal 300 mg this evening. Patient had a few very small red bumps on bilateral hip area that are itchy. Ordered benadryl 25 mg prn for the itching. It is unclear if this is reaction to the increase of trileptal due to some other irritant.

## 2022-07-02 NOTE — Progress Notes (Signed)
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   Yesterday's recommendations per psychiatric team: -Continue Trileptal 150 BID -Discontinue Cymbalta -Trazodone 50 mg PRN insomnia. -Start Naltrexone 50 mg -CIWA protocol for monitoring of withdrawal with po thiamine and MVI replacement and Ativan 30m for scores >10    On evaluation today: Patient reports continued improvements to her mood, and reports sleeping well last night.  She denies feeling anxious, but endorses having a "nervous stomach" as well as feeling somewhat foggy from discontinuing Cymbalta.  We discussed at large the assignment for affirmations given in group yesterday.  Patient feels motivated to continue to work on reflective assignments as well as going to rehabilitation after discharge.  She called into question whether or not she should go, but advised that her gut was telling her to proceed.  She denies SI (both active and passive), HI, AVH, and paranoia today.  Other than a brain fogginess, she denies all other somatic symptoms.  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   Procedure: TOTAL KNEE ARTHROPLASTY;  Surgeon: OParalee Cancel MD;  Location: [facility];  Service: Orthopedics;  Laterality: Right;  70 mins   Family History:  Family History  Problem Relation Age of Onset   Anxiety disorder Sister    Depression Brother    Schizophrenia Brother    Suicidality Brother    Anxiety disorder Brother    Alcohol abuse Sister    Alcohol abuse Brother    Alcohol abuse Brother    Family Psychiatric  History: See H&P Social History:  Social History   Substance and Sexual Activity  Alcohol Use Yes   Comment: reports recent relapse on alcohol after multiple 3 months stints of sobriety.     Social History   Substance and Sexual Activity  Drug Use No    Social History   Socioeconomic History   Marital status: Divorced    Spouse name: Not on file   Number of children: Not on file   Years of education: Not on file   Highest education level: Not on file  Occupational History   Not on file  Tobacco Use   Smoking status: Never   Smokeless tobacco: Never  Vaping Use   Vaping Use: Never used  Substance and Sexual Activity   Alcohol use: Yes    Comment: reports recent relapse on alcohol after multiple 3 months stints of sobriety.   Drug use: No   Sexual activity: Not Currently  Other Topics Concern   Not on file  Social History Narrative   Not  on file   Social Determinants of Health   Financial Resource Strain: Not on file  Food Insecurity: Not on file  Transportation Needs: Not on file  Physical Activity: Not on file  Stress: Not on file  Social Connections: Not on file   Additional Social History:         Sleep: Good  Appetite:  Good  Current Medications: Current Facility-Administered Medications  Medication Dose Route Frequency Provider Last Rate Last Admin   acetaminophen (TYLENOL) tablet 650 mg  650 mg Oral Q6H PRN  Suella Broad, FNP   650 mg at 06/29/22 2129   alum & mag hydroxide-simeth (MAALOX/MYLANTA) 200-200-20 MG/5ML suspension 30 mL  30 mL Oral Q4H PRN Starkes-Perry, Gayland Curry, FNP       chewing gum (ORBIT) sugar free  1 Stick Oral TID Rosezetta Schlatter, MD       docusate (COLACE) 50 MG/5ML liquid 100 mg  100 mg Oral BID PRN Starkes-Perry, Gayland Curry, FNP       folic acid (FOLVITE) tablet 1 mg  1 mg Oral Daily Suella Broad, FNP   1 mg at 07/02/22 0744   loratadine (CLARITIN) tablet 10 mg  10 mg Oral Daily PRN Rosezetta Schlatter, MD   10 mg at 06/30/22 2126   magnesium hydroxide (MILK OF MAGNESIA) suspension 30 mL  30 mL Oral Daily PRN Suella Broad, FNP       naltrexone (DEPADE) tablet 50 mg  50 mg Oral QHS Rosezetta Schlatter, MD       OXcarbazepine (TRILEPTAL) tablet 150 mg  150 mg Oral BID Rosezetta Schlatter, MD   150 mg at 07/02/22 0744   thiamine (VITAMIN B1) tablet 100 mg  100 mg Oral Daily Suella Broad, FNP   100 mg at 07/02/22 0744   traZODone (DESYREL) tablet 50-100 mg  50-100 mg Oral QHS Rosezetta Schlatter, MD   100 mg at 07/01/22 2101   And   traZODone (DESYREL) tablet 50 mg  50 mg Oral QHS PRN Rosezetta Schlatter, MD   50 mg at 07/01/22 2101   triamcinolone cream (KENALOG) 0.1 % cream   Topical TID PRN Suella Broad, FNP   Given at 06/29/22 1309    Lab Results:  No results found for this or any previous visit (from the past 48 hour(s)).   Blood Alcohol level:  Lab Results  Component Value Date   ETH 233 (H) 06/23/2022   ETH 250 (H) 30/05/6225    Metabolic Disorder Labs: Lab Results  Component Value Date   HGBA1C 5.0 06/30/2022   MPG 96.8 06/30/2022   MPG 105.41 10/27/2021   No results found for: "PROLACTIN" Lab Results  Component Value Date   CHOL 264 (H) 06/30/2022   TRIG 63 06/30/2022   HDL 101 06/30/2022   CHOLHDL 2.6 06/30/2022   VLDL 13 06/30/2022   LDLCALC 150 (H) 06/30/2022   LDLCALC 175 (H) 10/27/2021    Physical Findings: CIWA:   CIWA-Ar Total: 4  Musculoskeletal: Strength & Muscle Tone: within normal limits Gait & Station: normal Patient leans: N/A  Psychiatric Specialty Exam:  Presentation  General Appearance: Appropriate for Environment; Casual   Eye Contact:Good   Speech:Clear and Coherent; Normal Rate   Speech Volume:Normal   Handedness:Right    Mood and Affect  Mood:Anxious   Affect:Appropriate; Congruent    Thought Process  Thought Processes:Coherent; Linear   Descriptions of Associations:Intact   Orientation:Full (Time, Place and Person)   Thought Content:Logical   History  of Schizophrenia/Schizoaffective disorder:No   Duration of Psychotic Symptoms:No data recorded  Hallucinations:Hallucinations: None  Ideas of Reference:None   Suicidal Thoughts:Suicidal Thoughts: No  Homicidal Thoughts:Homicidal Thoughts: No   Sensorium  Memory:Immediate Good; Recent Good   Judgment:Fair   Insight:Fair    Executive Functions  Concentration:Good   Attention Span:Good   [place] of Knowledge:Good   Language:Good    Psychomotor Activity  Psychomotor Activity:Psychomotor Activity: Normal   Assets  Assets:Communication Skills; Desire for Improvement; Financial Resources/Insurance; Housing; Resilience; Social Support; Vocational/Educational    Sleep  Sleep:Sleep: Poor    Physical Exam: Physical Exam Vitals reviewed.  Constitutional:      General: She is not in acute distress.    Appearance: She is not toxic-appearing.  HENT:     Head: Normocephalic and atraumatic.     Mouth/Throat:     Mouth: Mucous membranes are moist.     Pharynx: Oropharynx is clear.  Pulmonary:     Effort: Pulmonary effort is normal.  Skin:    General: Skin is warm and dry.  Neurological:     General: No focal deficit present.     Mental Status: She is alert and oriented to person, place, and time. Mental status is at baseline.     Motor: No weakness.      Gait: Gait normal.    Review of Systems  Constitutional:  Negative for diaphoresis.  HENT:  Negative for sore throat.   Respiratory:  Negative for shortness of breath.   Cardiovascular:  Negative for chest pain.  Gastrointestinal: Negative.   Genitourinary: Negative.   Musculoskeletal: Negative.   Neurological:  Positive for sensory change. Negative for dizziness, tremors and headaches.       Brain fogginess   Blood pressure 108/73, pulse (!) 104, temperature 98.2 F (36.8 C), temperature source Oral, resp. rate 18, height _0  (1.626 m), weight 61.1 kg, SpO2 97 %. Body mass index is 23.14 kg/m.   Treatment Plan Summary: ASSESSMENT: Principal Problem:   Bipolar 1 disorder, depressed, moderate ([place]) Active Problems:   Alcohol abuse   Generalized anxiety disorder       PLAN: Safety and Monitoring:             -- Involuntary admission to inpatient psychiatric unit for safety, stabilization and treatment             -- Daily contact with patient to assess and evaluate symptoms and progress in treatment             -- Patient's case to be discussed in multi-disciplinary team meeting             -- Observation Level : q15 minute checks             -- Vital signs:  q12 hours             -- Precautions: suicide, elopement, and assault   2. Psychiatric Diagnoses and Treatment:  #Bipolar 1 Disorder, current episode depressed #Cluster B personality traits -Continue Trileptal 150 mg twice daily (started 8/11); with the plan to titrate this evening to 300 mg  -Na 136, ALT/AST 21/36, alk phos 52  -Repeat CMP on 8/15 -Restart home Cymbalta 30 mg x 3 doses, then discontinue; patient with mild somatic complaints after discontinuing. -Continue trazodone 50 to 100 mg nightly for insomnia -Recommend therapy, DBT or CBT (if options limited) upon discharge; patient agreeable.     PRN: Additional trazodone 50 mg PRN insomnia.   --  The risks/benefits/side-effects/alternatives to this  medication were discussed in detail with the patient and time was given for questions. The patient consents to medication trial.              -- Metabolic profile and EKG monitoring obtained while on an atypical antipsychotic  BMI: 23.14 Lipid Panel: pending HbgA1c: pending QTc: 427             -- Encouraged patient to participate in unit milieu and in scheduled group therapies    3. Medical Diagnoses and Treatment: #Alcohol Use Disorder -Continue naltrexone 50 mg nightly - Discontinue home gabapentin -CIWA protocol for monitoring of withdrawal with po thiamine and MVI replacement and Ativan 33m for scores >10       4. Discharge Planning:              -- Social work and case management to assist with discharge planning and identification of hospital follow-up needs prior to discharge             -- Estimated LOS 5 to 7 days             -- Discharge Concerns: Need to establish a safety plan             -- Discharge Goals: Return home with outpatient referrals for mental health follow-up including medication management/psychotherapy    CRosezetta Schlatter MD 07/02/2022, 8:03 AM

## 2022-07-02 NOTE — Group Note (Signed)
LCSW Group Therapy Note  07/02/2022      Type of Therapy and Topic:  Group Therapy: Gratitude  Participation Level:  Active   Description of Group:   In this group, patients shared and discussed the importance of acknowledging the elements in their lives for which they are grateful and how this can positively impact their mood.  The group discussed how bringing the positive elements of their lives to the forefront of their minds can help with recovery from any illness, physical or mental.  An exercise was done as a group in which a list was made of gratitude items in order to encourage participants to consider other potential positives in their lives.  Therapeutic Goals: Patients will identify one or more item for which they are grateful in each of 6 categories:  people, experience, thing, place, skill, and other. Patients will discuss how it is possible to seek out gratitude in even bad situations. Patients will explore other possible items of gratitude that they could remember.   Summary of Patient Progress:  The patient was late to group so she did not participate in the round robin where patients shared what they are grateful for.  However, once she arrived, she participated fully.   Therapeutic Modalities:   Solution-Focused Therapy Activity  Berlin Hun Grossman-Orr, LCSW .

## 2022-07-02 NOTE — [facility] Suicide Risk Assessment (Signed)
[place] INPATIENT:  Family/Significant Other Suicide Prevention Education  Suicide Prevention Education:  Education Completed; [place], daughter, [phone]  ,  (name of family member/significant other) has been identified by the patient as the family member/significant other with whom the patient will be residing, and identified as the person(s) who will aid the patient in the event of a mental health crisis (suicidal ideations/suicide attempt).    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  Daughter's concern is that the patient drinks alcohol on top of her depression, and once she is drinking she will lie about everything.  Daughter stated that it is factual that she and her sisters will be paying for rehab and that the patient has agreed to go.  She is concerned about finding something more affordable.  She was informed about [facility] in [facility] being a completely free resource since the patient is a resident.  She was also given the names of [facility], [facility], [facility].  With written consent from the patient, the family member/significant other has been provided the following suicide prevention education, prior to the and/or following the discharge of the patient.  The suicide prevention education provided includes the following: Suicide risk factors Suicide prevention and interventions National Suicide Hotline telephone number [facility] assessment telephone number [facility] Emergency Assistance [place] and/or Residential Mobile Crisis Unit telephone number  Request made of family/significant other to: Remove weapons (e.g., guns, rifles, knives), all items previously/currently identified as safety concern.   Remove drugs/medications (over-the-counter, prescriptions, illicit drugs), all items previously/currently identified as a  safety concern.  The family member/significant other verbalizes understanding of the suicide prevention education information provided.  The family member/significant other agrees to remove the items of safety concern listed above.  Berlin Hun Grossman-Orr 07/02/2022, 2:41 PM

## 2022-07-02 NOTE — B[facility] Group Notes (Signed)
Adult Psychoeducational Group Note Date:  07/02/2022 Time:  0900-1045 Group Topic/Focus: PROGRESSIVE RELAXATION. A group where deep breathing is taught and tensing and relaxation muscle groups is used. Imagery is used as well.  Pts are asked to imagine 3 pillars that hold them up when they are not able to hold themselves up and to share that with the group.  Participation Level:  Active  Participation Quality:  Appropriate  Affect:  Appropriate  Cognitive:  Oriented  Insight: Improving  Engagement in Group:  Engaged  Modes of Intervention:  Activity, Discussion, Education, and Support  Additional Comments:  Pt Participated fully in the group. Rates her energy at an 8/10. Pays total attention to every thing that is being said in the group.  Debra Rhodes

## 2022-07-03 DIAGNOSIS — F1010 Alcohol abuse, uncomplicated: Secondary | ICD-10-CM | POA: Diagnosis not present

## 2022-07-03 DIAGNOSIS — F3132 Bipolar disorder, current episode depressed, moderate: Secondary | ICD-10-CM | POA: Diagnosis not present

## 2022-07-03 DIAGNOSIS — F411 Generalized anxiety disorder: Secondary | ICD-10-CM | POA: Diagnosis not present

## 2022-07-03 MED ORDER — GABAPENTIN 100 MG PO CAPS
100.0000 mg | ORAL_CAPSULE | Freq: Three times a day (TID) | ORAL | Status: DC
  Administered 2022-07-03 – 2022-07-06 (×10): 100 mg via ORAL
  Filled 2022-07-03 (×5): qty 1
  Filled 2022-07-03 (×2): qty 21
  Filled 2022-07-03 (×6): qty 1
  Filled 2022-07-03: qty 21
  Filled 2022-07-03 (×3): qty 1

## 2022-07-03 MED ORDER — DULOXETINE HCL 20 MG PO CPEP
20.0000 mg | ORAL_CAPSULE | Freq: Every day | ORAL | Status: AC
  Administered 2022-07-04: 20 mg via ORAL
  Filled 2022-07-03: qty 1

## 2022-07-03 NOTE — Group Note (Signed)
Recreation Therapy Group Note   Group Topic:Stress Management  Group Date: 07/03/2022 Start Time: 0930 End Time: 0950 Facilitators: Victorino Sparrow, [place] Location: [address] Dayroom   Goal Area(s) Addresses:  Patient will identify positive stress management techniques. Patient will identify benefits of using stress management post d/c.  Group Description:  Meditation.  LRT played a mountain meditation.  This meditation focused on taking on the characteristics of the mountain into meditation and everyday life.  The meditation centered on how mountains go through each season, they are able to take on those changes and remain steady through it.  The meditation brings that same steadiness into everyday living.    Affect/Mood: N/A   Participation Level: Did not attend    Clinical Observations/Individualized Feedback:     Plan: Continue to engage patient in RT group sessions 2-3x/week.   Victorino Sparrow, LRT,CTRS 07/03/2022 11:20 AM

## 2022-07-03 NOTE — Progress Notes (Signed)
requested support and prayers with chaplain and other group members following group.      Chaplain provided support and prayers at pt request.

## 2022-07-03 NOTE — Progress Notes (Signed)
Patient is pleasant on approach, denies SI/HI/AVH. Patient is compliant with routine medication tolerated them well with no side effect noted. No acute distress noted, and patient denies any pain or discomfort. Patient states the most important thing to work on will be her discharged plan, and she will like to make call to the 28 days programs.  Staff will continue to provide support and reassurance to patient.  07/03/22 0800  Psych Admission Type (Psych Patients Only)  Admission Status Involuntary  Psychosocial Assessment  Patient Complaints Anxiety  Eye Contact Fair  Facial Expression Anxious  Affect Anxious  Speech Logical/coherent  Interaction Assertive  Motor Activity Other (Comment) (wnl)  Appearance/Hygiene Unremarkable  Behavior Characteristics Cooperative;Anxious  Mood Pleasant  Aggressive Behavior  Effect No apparent injury  Thought Process  Coherency WDL  Content WDL  Delusions None reported or observed  Perception WDL  Hallucination None reported or observed  Judgment Limited  Confusion None  Danger to Self  Current suicidal ideation? Denies  Agreement Not to Harm Self Yes  Description of Agreement verbal contracts for safety  Danger to Others  Danger to Others None reported or observed

## 2022-07-03 NOTE — Group Note (Signed)
LCSW Group Therapy Note   Group Date: 07/03/2022 Start Time: 1300 End Time: 1400  Type of Therapy and Topic:  Group Therapy: Anger Cues and Responses  Participation Level:  Active   Description of Group:   In this group, patients learned how to recognize the physical, cognitive, emotional, and behavioral responses they have to anger-provoking situations.  They identified a recent time they became angry and how they reacted.  They analyzed how their reaction was possibly beneficial and how it was possibly unhelpful.  The group discussed a variety of healthier coping skills that could help with such a situation in the future.  Focus was placed on how helpful it is to recognize the underlying emotions to our anger, because working on those can lead to a more permanent solution as well as our ability to focus on the important rather than the urgent.  Therapeutic Goals: Patients will remember their last incident of anger and how they felt emotionally and physically, what their thoughts were at the time, and how they behaved. Patients will identify how their behavior at that time worked for them, as well as how it worked against them. Patients will explore possible new behaviors to use in future anger situations. Patients will learn that anger itself is normal and cannot be eliminated, and that healthier reactions can assist with resolving conflict rather than worsening situations.  Summary of Patient Progress:  Pt was present/active throughout the session and proved open to feedback from [place] and peers. Patient demonstrated good insight into the subject matter, was respectful of peers, and was present and engaged throughout the entire session.  Therapeutic Modalities:   Cognitive Behavioral Therapy   Debra Rhodes 07/03/2022  6:59 PM

## 2022-07-03 NOTE — Progress Notes (Signed)
   07/02/22 2000  Psychosocial Assessment  Patient Complaints Anxiety;Depression (Rates depression 4# and anxiety 3#)  Eye Contact Fair  Facial Expression Anxious  Affect Anxious;Depressed  Speech Logical/coherent  Interaction Assertive  Motor Activity Other (Comment) (WNL)  Appearance/Hygiene Unremarkable  Behavior Characteristics Cooperative;Anxious  Mood Depressed;Anxious;Pleasant  Thought Process  Coherency WDL  Content WDL  Delusions None reported or observed  Perception WDL  Hallucination None reported or observed  Judgment Limited  Confusion None  Danger to Self  Current suicidal ideation? Denies  Danger to Others  Danger to Others None reported or observed

## 2022-07-03 NOTE — Progress Notes (Signed)
Spiritual care group on grief and loss facilitated by chaplain Jerene Pitch MDiv, [facility]  Group Goal:  Support / Education around grief and loss Members engage in facilitated group support and psycho-social education.  Group Description:  Following introductions and group rules, group members engaged in facilitated group dialog and support around topic of loss, with particular support around experiences of loss in their lives. Group Identified types of loss (relationships / self / things) and identified patterns, circumstances, and changes that precipitate losses. Reflected on thoughts / feelings around loss, normalized grief responses, and recognized variety in grief experience.   Group noted Worden's four tasks of grief in discussion.  Group drew on Adlerian / Rogerian, narrative, MI, Patient Progress:  Debra Rhodes was present throughout group.  Engaged group around leaving a relationship and working to find herself in a new space.

## 2022-07-03 NOTE — [facility] Group Notes (Signed)
[place] Group Notes:  (Nursing/MHT/Case Management/Adjunct)  Date:  07/03/2022  Time:  9:23 AM  Type of Therapy:  Group Therapy  Participation Level:  Active  Participation Quality:  Appropriate  Affect:  Appropriate  Cognitive:  Appropriate  Insight:  Appropriate  Engagement in Group:  Engaged  Modes of Intervention:  Discussion  Summary of Progress/Problems:  Patient attended and participated in a goals/ orientation group today. Patient's goal for today is to find placement once she leaves here.   Elza Rafter 07/03/2022, 9:23 AM

## 2022-07-03 NOTE — Plan of Care (Signed)
  Problem: Coping: Goal: Ability to demonstrate self-control will improve Outcome: Progressing   Problem: Education: Goal: Knowledge of disease or condition will improve Outcome: Progressing Goal: Understanding of discharge needs will improve Outcome: Progressing

## 2022-07-03 NOTE — Progress Notes (Signed)
[facility] LCSW Progress Note   07/03/2022    4:11 PM  Debra Rhodes   [id number]   Type of Contact and Topic:  SUD treatment referral    CSW sent referral for two facilities, [facility] and [facility], per patient request. CSW will follow up with patient regarding progress of referral.    Signed:  Durenda Hurt, MSW, LCSWA, LCAS 07/03/2022 4:11 PM

## 2022-07-03 NOTE — Progress Notes (Addendum)
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   Yesterday's recommendations per psychiatric team: -Continue Trileptal 150 BID -Discontinue Cymbalta -Trazodone 50 mg PRN insomnia. -Start Naltrexone 50 mg -CIWA protocol for monitoring of withdrawal with po thiamine and MVI replacement and Ativan $RemoveBe'1mg'LlLqAuhMB$  for scores >10    On evaluation today: The patient reports that her mood is less depressed.  She reports tolerating medication well.   She reports that sleep is better.  She denies feeling anxious, and denies having a "nervous stomach" today.  She also notes resolution of leg cramping at nighttime since starting the multivitamin.  She advises that she has new rashes on her lower abdomen bilaterally that were itching last night, but have since ceased to itch since starting triamcinolone cream.  She reports that rashes have resolved by the afternoon.  She believes it may be due to the detergent, but wanted to verify.  Denies any blistering, mucosal sores, fevers, chills.  Patient feels motivated to continue to work toward going to rehabilitation after discharge; she is advised that her social worker will be seeing her to continue this process. She denies SI (both active and passive), HI, AVH, and paranoia today.    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   Procedure: TOTAL KNEE ARTHROPLASTY;  Surgeon: Paralee Cancel, MD;  Location: [facility];  Service: Orthopedics;  Laterality: Right;  70 mins   Family History:  Family History  Problem Relation Age of Onset   Anxiety disorder Sister    Depression Brother    Schizophrenia Brother    Suicidality Brother    Anxiety disorder Brother    Alcohol abuse Sister    Alcohol abuse Brother    Alcohol abuse Brother    Family Psychiatric  History: See H&P Social History:  Social History   Substance and Sexual Activity  Alcohol Use Yes   Comment: reports recent relapse on alcohol after multiple 3 months stints of sobriety.     Social History   Substance and Sexual Activity  Drug Use No    Social History   Socioeconomic History   Marital status: Divorced    Spouse name: Not on file   Number of children: Not on file   Years of education: Not on file   Highest education level: Not on file  Occupational History   Not on file  Tobacco Use   Smoking status: Never   Smokeless tobacco: Never  Vaping Use   Vaping Use: Never used  Substance and Sexual Activity   Alcohol use: Yes  Comment: reports recent relapse on alcohol after multiple 3 months stints of sobriety.   Drug use: No   Sexual activity: Not Currently  Other Topics Concern   Not on file  Social History Narrative   Not on file   Social Determinants of Health   Financial Resource Strain: Not on file  Food Insecurity: Not on file  Transportation Needs: Not on file  Physical Activity: Not on file  Stress: Not on file  Social Connections: Not on file   Additional Social History:         Sleep:  Good  Appetite:  Good  Current Medications: Current Facility-Administered Medications  Medication Dose Route Frequency Provider Last Rate Last Admin   acetaminophen (TYLENOL) tablet 650 mg  650 mg Oral Q6H PRN Suella Broad, FNP   650 mg at 06/29/22 2129   alum & mag hydroxide-simeth (MAALOX/MYLANTA) 200-200-20 MG/5ML suspension 30 mL  30 mL Oral Q4H PRN Starkes-Perry, Gayland Curry, FNP       chewing gum (ORBIT) sugar free  1 Stick Oral TID Rosezetta Schlatter, MD   1 Stick at 07/03/22 1200   diphenhydrAMINE (BENADRYL) capsule 25 mg  25 mg Oral Daily PRN Bobbitt, Shalon E, NP       docusate (COLACE) 50 MG/5ML liquid 100 mg  100 mg Oral BID PRN Suella Broad, FNP       [START ON 07/04/2022] DULoxetine (CYMBALTA) DR capsule 20 mg  20 mg Oral Daily Cosby, Loma Sousa, MD       folic acid (FOLVITE) tablet 1 mg  1 mg Oral Daily Suella Broad, FNP   1 mg at 07/03/22 0746   gabapentin (NEURONTIN) capsule 100 mg  100 mg Oral TID Rosezetta Schlatter, MD   100 mg at 07/03/22 1400   loratadine (CLARITIN) tablet 10 mg  10 mg Oral Daily PRN Rosezetta Schlatter, MD   10 mg at 07/02/22 2128   magnesium hydroxide (MILK OF MAGNESIA) suspension 30 mL  30 mL Oral Daily PRN Suella Broad, FNP       multivitamin with minerals tablet 1 tablet  1 tablet Oral Daily Rosezetta Schlatter, MD   1 tablet at 07/03/22 0747   naltrexone (DEPADE) tablet 50 mg  50 mg Oral QHS Rosezetta Schlatter, MD   50 mg at 07/02/22 2128   Oxcarbazepine (TRILEPTAL) tablet 300 mg  300 mg Oral BID Rosezetta Schlatter, MD   300 mg at 07/03/22 0746   thiamine (VITAMIN B1) tablet 100 mg  100 mg Oral Daily Suella Broad, FNP   100 mg at 07/03/22 0746   traZODone (DESYREL) tablet 50-100 mg  50-100 mg Oral QHS Rosezetta Schlatter, MD   50 mg at 07/02/22 2127   And   traZODone (DESYREL) tablet 50 mg  50 mg Oral QHS PRN Rosezetta Schlatter, MD   50 mg at 07/01/22 2101   triamcinolone cream (KENALOG) 0.1 % cream   Topical TID PRN Suella Broad, FNP   Given at 07/02/22 2130    Lab Results:  No results found for this or any previous visit (from the past 40 hour(s)).   Blood Alcohol level:  Lab Results  Component Value Date   ETH 233 (H) 06/23/2022   ETH 250 (H) 59/16/3846    Metabolic Disorder Labs: Lab Results  Component Value Date   HGBA1C 5.0 06/30/2022   MPG 96.8 06/30/2022   MPG 105.41 10/27/2021   No results found for: "PROLACTIN" Lab Results  Component Value Date  CHOL 264 (H) 06/30/2022   TRIG 63 06/30/2022   HDL 101 06/30/2022   CHOLHDL 2.6 06/30/2022   VLDL 13 06/30/2022   LDLCALC 150 (H) 06/30/2022   LDLCALC 175 (H) 10/27/2021    Physical Findings: CIWA:  CIWA-Ar Total: 4  Musculoskeletal: Strength & Muscle Tone: within normal limits Gait & Station: normal Patient leans: N/A  Psychiatric Specialty Exam:  Presentation  General Appearance: Appropriate for Environment; Casual   Eye Contact:Good   Speech:Clear and Coherent; Normal Rate   Speech Volume:Normal   Handedness:Left    Mood and Affect  Mood: Less depressed   Affect:Appropriate; Congruent    Thought Process  Thought Processes:Coherent; Linear; Goal Directed   Descriptions of Associations:Intact   Orientation:Full (Time, Place and Person)   Thought Content:Logical   History of Schizophrenia/Schizoaffective disorder:No   Duration of Psychotic Symptoms:No data recorded  Hallucinations:Hallucinations: None   Ideas of Reference:None   Suicidal Thoughts:Suicidal Thoughts: No   Homicidal Thoughts:Homicidal Thoughts: No    Sensorium  Memory:Immediate Good; Recent Good   Judgment:Fair   Insight:Fair    Executive Functions  Concentration:Good   Attention Span:Good   [place] of Knowledge:Good   Language:Good    Psychomotor Activity  Psychomotor Activity:Psychomotor Activity: Normal    Assets  Assets:Communication Skills; Desire for Improvement;  Financial Resources/Insurance; Housing; Resilience; Social Support; Vocational/Educational    Sleep  Sleep:Sleep: Good     Physical Exam: Physical Exam Vitals reviewed.  Constitutional:      General: She is not in acute distress.    Appearance: She is not toxic-appearing.  HENT:     Head: Normocephalic and atraumatic.     Mouth/Throat:     Mouth: Mucous membranes are moist.     Pharynx: Oropharynx is clear.  Pulmonary:     Effort: Pulmonary effort is normal.  Skin:    General: Skin is warm and dry.     Findings: Rash present.     Comments: Red-colored rash on lower abdomen bilaterally, flat, nonpainful, and appears consistent with contact dermatitis.  Neurological:     General: No focal deficit present.     Mental Status: She is alert and oriented to person, place, and time. Mental status is at baseline.     Motor: No weakness.     Gait: Gait normal.    Review of Systems  Constitutional:  Negative for diaphoresis.  HENT:  Negative for sore throat.   Respiratory:  Negative for shortness of breath.   Cardiovascular:  Negative for chest pain.  Gastrointestinal: Negative.   Genitourinary: Negative.   Musculoskeletal: Negative.   Skin:  Positive for itching and rash.  Neurological:  Negative for dizziness, tremors, sensory change and headaches.   Blood pressure 136/84, pulse 79, temperature 98.4 F (36.9 C), temperature source Oral, resp. rate 16, height $RemoveBe'5\' 4"'BsNQNknEm$  (1.626 m), weight 61.1 kg, SpO2 97 %. Body mass index is 23.14 kg/m.   Treatment Plan Summary: ASSESSMENT: Principal Problem:   Bipolar 1 disorder, depressed, moderate ([place]) Active Problems:   Alcohol abuse   Generalized anxiety disorder       PLAN: Safety and Monitoring:             -- Involuntary admission to inpatient psychiatric unit for safety, stabilization and treatment             -- Daily contact with patient to assess and evaluate symptoms and progress in treatment             --  Patient's  case to be discussed in multi-disciplinary team meeting             -- Observation Level : q15 minute checks             -- Vital signs:  q12 hours             -- Precautions: suicide, elopement, and assault   2. Psychiatric Diagnoses and Treatment:  #Bipolar 1 Disorder, current episode depressed #Cluster B personality traits # Generalized anxiety disorder  -Continue Trileptal 300 mg twice daily (started 8/11)   -Na 136, ALT/AST 21/36, alk phos 52  -Repeat CMP on 8/15 -Decrease to home Cymbalta 20 mg x 1 dose, then discontinue (completed 30 mg x 2 doses); patient with mild somatic complaints after discontinuing 90 mg abruptly. -Start home gabapentin 100 mg 3 times daily for anxiety -Continue trazodone 50 to 100 mg nightly for insomnia -Recommend therapy, DBT or CBT (if options limited) upon discharge; patient agreeable.     PRN: Additional trazodone 50 mg PRN insomnia.   -- The risks/benefits/side-effects/alternatives to this medication were discussed in detail with the patient and time was given for questions. The patient consents to medication trial.              -- Metabolic profile and EKG monitoring obtained while on an atypical antipsychotic  BMI: 23.14 Lipid Panel: Total cholesterol 264, LDL 150 HbgA1c: 5.0% QTc: 427             -- Encouraged patient to participate in unit milieu and in scheduled group therapies    3. Medical Diagnoses and Treatment: #Alcohol Use Disorder -Continue naltrexone 50 mg nightly -CIWA protocol for monitoring of withdrawal with po thiamine and MVI replacement and Ativan $RemoveBe'1mg'fEMFHfoeS$  for scores >10   #Rash of lower abdomen -Continue triamcinolone cream 3 times daily as needed itching     4. Discharge Planning:              -- Social work and case management to assist with discharge planning and identification of hospital follow-up needs prior to discharge             -- Estimated LOS 5 to 7 days             -- Discharge Concerns: Need to establish a  safety plan             -- Discharge Goals: Return home with outpatient referrals for mental health follow-up including medication management/psychotherapy    Rosezetta Schlatter, MD 07/03/2022, 4:31 PM   Total Time Spent in Direct Patient Care:  I personally spent 35 minutes on the unit in direct patient care. The direct patient care time included face-to-face time with the patient, reviewing the patient's chart, communicating with other professionals, and coordinating care. Greater than 50% of this time was spent in counseling or coordinating care with the patient regarding goals of hospitalization, psycho-education, and discharge planning needs.  I have independently evaluated the patient during a face-to-face assessment on 07/03/22. I reviewed the patient's chart, and I participated in key portions of the service. I discussed the case with the [employer], and I agree with the assessment and plan of care as documented in the House Officer's note, as addended by me or notated below:  I directly edited the note, as above.   Janine Limbo, MD Psychiatrist

## 2022-07-03 NOTE — Progress Notes (Signed)
D) Pt received calm, visible, participating in milieu, and in no acute distress. Pt A & O x4. Pt denies SI, HI, A/ V H, depression, anxiety and pain at this time. A) Pt encouraged to drink fluids. Pt encouraged to come to staff with needs. Pt encouraged to attend and participate in groups. Pt encouraged to set reachable goals.  R) Pt remained safe on unit, in no acute distress, will continue to assess.      07/03/22 2000  Psych Admission Type (Psych Patients Only)  Admission Status Involuntary  Psychosocial Assessment  Patient Complaints Anxiety;Depression  Eye Contact Fair  Facial Expression Anxious  Affect Anxious  Speech Logical/coherent  Interaction Assertive  Motor Activity Other (Comment) (unremarkable)  Appearance/Hygiene Unremarkable  Behavior Characteristics Anxious  Mood Pleasant  Thought Process  Coherency WDL  Content WDL  Delusions None reported or observed  Perception WDL  Hallucination None reported or observed  Judgment Limited  Confusion None  Danger to Self  Current suicidal ideation? Denies  Agreement Not to Harm Self Yes  Description of Agreement verbal  Danger to Others  Danger to Others None reported or observed

## 2022-07-04 DIAGNOSIS — F3132 Bipolar disorder, current episode depressed, moderate: Secondary | ICD-10-CM | POA: Diagnosis not present

## 2022-07-04 DIAGNOSIS — F411 Generalized anxiety disorder: Secondary | ICD-10-CM | POA: Diagnosis not present

## 2022-07-04 DIAGNOSIS — F1010 Alcohol abuse, uncomplicated: Secondary | ICD-10-CM | POA: Diagnosis not present

## 2022-07-04 LAB — COMPREHENSIVE METABOLIC PANEL
ALT: 34 U/L (ref 0–44)
AST: 22 U/L (ref 15–41)
Albumin: 3.8 g/dL (ref 3.5–5.0)
Alkaline Phosphatase: 39 U/L (ref 38–126)
Anion gap: 8 (ref 5–15)
BUN: 9 mg/dL (ref 8–23)
CO2: 26 mmol/L (ref 22–32)
Calcium: 9.3 mg/dL (ref 8.9–10.3)
Chloride: 94 mmol/L — ABNORMAL LOW (ref 98–111)
Creatinine, Ser: 0.65 mg/dL (ref 0.44–1.00)
GFR, Estimated: >60 mL/min (ref >60)
Glucose, Bld: 103 mg/dL — ABNORMAL HIGH (ref 70–99)
Potassium: 3.9 mmol/L (ref 3.5–5.1)
Sodium: 128 mmol/L — ABNORMAL LOW (ref 135–145)
Total Bilirubin: 0.9 mg/dL (ref 0.3–1.2)
Total Protein: 6.6 g/dL (ref 6.5–8.1)

## 2022-07-04 MED ORDER — ARIPIPRAZOLE 10 MG PO TABS
10.0000 mg | ORAL_TABLET | Freq: Every day | ORAL | Status: DC
  Administered 2022-07-05 – 2022-07-06 (×2): 10 mg via ORAL
  Filled 2022-07-04 (×2): qty 1
  Filled 2022-07-04: qty 7
  Filled 2022-07-04: qty 1

## 2022-07-04 NOTE — Progress Notes (Signed)
Pt denies SI/HI/AVH.  Described her mood as happy.  Pt took medications without incident and no adverse reactions were noted.  RN will continue to provide support and assist as needed.  07/04/22 1100  Psych Admission Type (Psych Patients Only)  Admission Status Involuntary  Psychosocial Assessment  Patient Complaints Depression  Eye Contact Fair  Facial Expression Anxious  Affect Anxious  Speech Logical/coherent  Interaction Assertive  Motor Activity Slow  Appearance/Hygiene Unremarkable  Behavior Characteristics Anxious  Mood Pleasant  Thought Process  Coherency WDL  Content WDL  Delusions None reported or observed  Perception WDL  Hallucination None reported or observed  Judgment WDL  Confusion None  Danger to Self  Current suicidal ideation? Denies  Danger to Others  Danger to Others None reported or observed

## 2022-07-04 NOTE — Progress Notes (Signed)
Pt rates depression 0/10 and anxiety 2/10. Pt reports a good appetite, and no physical problems. Pt denies SI/HI/AVH and verbally contracts for safety. Provided support and encouragement. Pt safe on the unit. Q 15 minute safety checks continued.

## 2022-07-04 NOTE — Group Note (Signed)
Date:  07/04/2022 Time:  10:14 AM  Group Topic/Focus:  Orientation:   The focus of this group is to educate the patient on the purpose and policies of crisis stabilization and provide a format to answer questions about their admission.  The group details unit policies and expectations of patients while admitted.    Participation Level:  Active  Participation Quality:  Appropriate  Affect:  Appropriate  Cognitive:  Appropriate  Insight: Appropriate  Engagement in Group:  Engaged  Modes of Intervention:  Discussion  Additional Comments:     Jerrye Beavers 07/04/2022, 10:14 AM

## 2022-07-04 NOTE — Group Note (Signed)
Recreation Therapy Group Note   Group Topic:Animal Assisted Therapy   Group Date: 07/04/2022 Start Time: 1430 End Time: 1500 Facilitators: Aaliyana Fredericks, Bjorn Loser, LRT Location: [address] Dayroom  Animal-Assisted Activity (AAA) Program Checklist/Progress Notes Patient Eligibility Criteria Checklist & Daily Group note for Rec Tx Intervention   AAA/T Program Assumption of Risk Form signed by Patient/ or Parent Legal Guardian YES  Patient is free of allergies or severe asthma  YES  Patient reports no fear of animals YES  Patient reports no history of cruelty to animals YES  Patient understands their participation is voluntary YES  Patient washes hands before animal contact YES  Patient washes hands after animal contact YES   Group Description: Patients provided opportunity to interact with trained and credentialed [employer] Therapy dog and the community volunteer/dog handler. Patients practiced appropriate animal interaction and were educated on dog safety outside of the hospital in common community settings. Patients were encouraged to socialize with one another and share about their experiences with animals and pets. Patients participated with turn taking for dog interactions with structure imposed as needed based on number of participants and quality of spontaneous participation delivered.  Goal Area(s) Addresses:  Patient will demonstrate appropriate social skills during group session.  Patient will demonstrate ability to follow instructions during group session.  Patient will identify if a reduction in stress level occurs as a result of participation in animal assisted therapy session.    Education: Contractor, Pensions consultant, Communication & Social Skills   Affect/Mood: Constricted and Euthymic   Participation Level: Moderate   Participation Quality: Independent   Behavior: Calm, Cooperative, and Interactive    Modes of Intervention: Activity, Multimedia programmer, and Socialization   Patient Response to Interventions:  Receptive   Education Outcome:  Acknowledges education   Clinical Observations/Individualized Feedback: Debra Rhodes appropriately pet the visiting therapy dog, Debra Rhodes briefly during group and asked relevant questions to community volunteer. Pt shared that they have a dog named Debra Rhodes and a cat named Debra Rhodes as pets at home. Pt noted to have a book and preferred to read toward end of session.    Bjorn Loser Debra Rhodes, LRT, CTRS 07/04/2022 4:52 PM

## 2022-07-04 NOTE — [facility] Counselor (Signed)
CSW met with patient and learned that she was accepted for [facility] in [place].  When patient is discharged she plans to be picked up by her daughter and go home and then pack up her things.  Daughter plans to take her directly theres.  Patient asked for discharge paperwork to be sent there and would let us know if they needed anything else prior to discharge.    Ayana Imhof, LCSW, [place] Social Worker  [facility]

## 2022-07-04 NOTE — Progress Notes (Signed)
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   Yesterday's recommendations per psychiatric team: -Continue Trileptal 300 mg twice daily (started 8/11)  -Decrease to home Cymbalta 20 mg x 1 dose, then discontinue (completed 30 mg x 2 doses); patient with mild somatic complaints after discontinuing 90 mg abruptly. -Start home gabapentin 100 mg 3 times daily for anxiety -Continue trazodone 50 to 100 mg nightly for insomnia -CIWA protocol for monitoring of withdrawal with po thiamine and MVI replacement and Ativan $RemoveBe'1mg'dtqlhxOeN$  for scores >10    On evaluation today: The patient reports that her mood is again less depressed.  She feels motivated to prepare for discharge to go to rehab. She found a bed opening at [facility] in [place]. We discuss her lab results and how her hyponatremia warrants discontinuation of current medication regimen and to start a new regimen. She then asks if that is why she has experienced chills, lightheadedness, and chest and abdominal tightness overnight. She denies further somatic complaints. She reports improved sleep and intact appetite.  She denies SI (both active and passive), HI, AVH, and paranoia today.    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   Procedure: TOTAL KNEE ARTHROPLASTY;  Surgeon: Paralee Cancel, MD;  Location: [facility];  Service: Orthopedics;  Laterality: Right;  70 mins   Family History:  Family History  Problem Relation Age of Onset   Anxiety disorder Sister    Depression Brother    Schizophrenia Brother    Suicidality Brother    Anxiety disorder Brother    Alcohol abuse Sister    Alcohol abuse Brother    Alcohol abuse Brother    Family Psychiatric  History: See H&P Social History:  Social History   Substance and Sexual Activity  Alcohol Use Yes   Comment: reports recent relapse on alcohol after multiple 3 months stints of sobriety.     Social History   Substance and Sexual Activity  Drug Use No    Social History   Socioeconomic History   Marital status: Divorced    Spouse name: Not on file   Number of children: Not on file   Years of education: Not on file   Highest education level: Not on file  Occupational History   Not on file  Tobacco Use   Smoking status: Never   Smokeless tobacco: Never  Vaping Use   Vaping Use: Never used  Substance and Sexual Activity   Alcohol use: Yes    Comment: reports recent relapse on alcohol after multiple 3 months stints of  sobriety.   Drug use: No   Sexual activity: Not Currently  Other Topics Concern   Not on file  Social History Narrative   Not on file   Social Determinants of Health   Financial Resource Strain: Not on file  Food Insecurity: Not on file  Transportation Needs: Not on file  Physical Activity: Not on file  Stress: Not on file  Social Connections: Not on file   Additional Social History:         Sleep: Good  Appetite:  Good  Current Medications: Current  Facility-Administered Medications  Medication Dose Route Frequency Provider Last Rate Last Admin   acetaminophen (TYLENOL) tablet 650 mg  650 mg Oral Q6H PRN Suella Broad, FNP   650 mg at 06/29/22 2129   alum & mag hydroxide-simeth (MAALOX/MYLANTA) 200-200-20 MG/5ML suspension 30 mL  30 mL Oral Q4H PRN Starkes-Perry, Gayland Curry, FNP       chewing gum (ORBIT) sugar free  1 Stick Oral TID Rosezetta Schlatter, MD   1 Stick at 07/03/22 1655   diphenhydrAMINE (BENADRYL) capsule 25 mg  25 mg Oral Daily PRN Bobbitt, Shalon E, NP       docusate (COLACE) 50 MG/5ML liquid 100 mg  100 mg Oral BID PRN Starkes-Perry, Gayland Curry, FNP       DULoxetine (CYMBALTA) DR capsule 20 mg  20 mg Oral Daily Kenzleigh Sedam, MD       folic acid (FOLVITE) tablet 1 mg  1 mg Oral Daily Suella Broad, FNP   1 mg at 07/03/22 0746   gabapentin (NEURONTIN) capsule 100 mg  100 mg Oral TID Rosezetta Schlatter, MD   100 mg at 07/03/22 1655   loratadine (CLARITIN) tablet 10 mg  10 mg Oral Daily PRN Rosezetta Schlatter, MD   10 mg at 07/02/22 2128   magnesium hydroxide (MILK OF MAGNESIA) suspension 30 mL  30 mL Oral Daily PRN Suella Broad, FNP       multivitamin with minerals tablet 1 tablet  1 tablet Oral Daily Rosezetta Schlatter, MD   1 tablet at 07/03/22 0747   naltrexone (DEPADE) tablet 50 mg  50 mg Oral QHS Rosezetta Schlatter, MD   50 mg at 07/03/22 2143   Oxcarbazepine (TRILEPTAL) tablet 300 mg  300 mg Oral BID Rosezetta Schlatter, MD   300 mg at 07/03/22 1656   thiamine (VITAMIN B1) tablet 100 mg  100 mg Oral Daily Suella Broad, FNP   100 mg at 07/03/22 0746   traZODone (DESYREL) tablet 50-100 mg  50-100 mg Oral QHS Rosezetta Schlatter, MD   100 mg at 07/03/22 2142   And   traZODone (DESYREL) tablet 50 mg  50 mg Oral QHS PRN Rosezetta Schlatter, MD   50 mg at 07/01/22 2101   triamcinolone cream (KENALOG) 0.1 % cream   Topical TID PRN Suella Broad, FNP   Given at 07/02/22 2130    Lab Results:  No results found  for this or any previous visit (from the past 47 hour(s)).   Blood Alcohol level:  Lab Results  Component Value Date   ETH 233 (H) 06/23/2022   ETH 250 (H) 56/38/9373    Metabolic Disorder Labs: Lab Results  Component Value Date   HGBA1C 5.0 06/30/2022   MPG 96.8 06/30/2022   MPG 105.41 10/27/2021   No results found for: "PROLACTIN" Lab Results  Component Value Date   CHOL 264 (H) 06/30/2022   TRIG 63 06/30/2022   HDL 101  06/30/2022   CHOLHDL 2.6 06/30/2022   VLDL 13 06/30/2022   LDLCALC 150 (H) 06/30/2022   LDLCALC 175 (H) 10/27/2021    Physical Findings: CIWA:  CIWA-Ar Total: 4  Musculoskeletal: Strength & Muscle Tone: within normal limits Gait & Station: normal Patient leans: N/A  Psychiatric Specialty Exam:  Presentation  General Appearance: Appropriate for Environment; Casual   Eye Contact:Good   Speech:Clear and Coherent; Normal Rate   Speech Volume:Normal   Handedness:Left    Mood and Affect  Mood: Less depressed   Affect:Appropriate; Congruent    Thought Process  Thought Processes:Coherent; Linear; Goal Directed   Descriptions of Associations:Intact   Orientation:Full (Time, Place and Person)   Thought Content:Logical   History of Schizophrenia/Schizoaffective disorder:No   Duration of Psychotic Symptoms:No data recorded  Hallucinations:Hallucinations: None   Ideas of Reference:None   Suicidal Thoughts:Suicidal Thoughts: No   Homicidal Thoughts:Homicidal Thoughts: No    Sensorium  Memory:Immediate Good; Recent Good   Judgment:Fair   Insight:Fair    Executive Functions  Concentration:Good   Attention Span:Good   [place] of Knowledge:Good   Language:Good    Psychomotor Activity  Psychomotor Activity:Psychomotor Activity: Normal    Assets  Assets:Communication Skills; Desire for Improvement; Financial Resources/Insurance; Housing; Resilience; Social Support;  Vocational/Educational    Sleep  Sleep:Sleep: Good     Physical Exam: Physical Exam Vitals reviewed.  Constitutional:      General: She is not in acute distress.    Appearance: She is not toxic-appearing.  HENT:     Head: Normocephalic and atraumatic.     Mouth/Throat:     Mouth: Mucous membranes are moist.     Pharynx: Oropharynx is clear.  Pulmonary:     Effort: Pulmonary effort is normal.  Skin:    General: Skin is warm and dry.     Findings: No rash.  Neurological:     General: No focal deficit present.     Mental Status: She is alert and oriented to person, place, and time. Mental status is at baseline.     Motor: No weakness.     Gait: Gait normal.    Review of Systems  Constitutional:  Positive for chills. Negative for diaphoresis and fever.  HENT:  Negative for sore throat.   Eyes:        Floaters  Respiratory:  Negative for shortness of breath.   Cardiovascular:  Negative for chest pain.  Gastrointestinal: Negative.   Genitourinary: Negative.   Musculoskeletal: Negative.   Skin:  Negative for itching and rash.  Neurological:  Positive for dizziness. Negative for tremors, sensory change and headaches.   Blood pressure 103/74, pulse 75, temperature 98.4 F (36.9 C), temperature source Oral, resp. rate 18, height $RemoveBe'5\' 4"'UEbkOiwnN$  (1.626 m), weight 61.1 kg, SpO2 100 %. Body mass index is 23.14 kg/m.   Treatment Plan Summary: ASSESSMENT: Principal Problem:   Bipolar 1 disorder, depressed, moderate ([place]) Active Problems:   Alcohol abuse   Generalized anxiety disorder       PLAN: Safety and Monitoring:             -- Involuntary admission to inpatient psychiatric unit for safety, stabilization and treatment             -- Daily contact with patient to assess and evaluate symptoms and progress in treatment             -- Patient's case to be discussed in multi-disciplinary team meeting             --  Observation Level : q15 minute checks             -- Vital  signs:  q12 hours             -- Precautions: suicide, elopement, and assault   2. Psychiatric Diagnoses and Treatment:  #Bipolar 1 Disorder, current episode depressed #Cluster B personality traits # Generalized anxiety disorder - Will start Abilify 10 mg daily tomorrow morning for mood stabilization. -Discontinue Trileptal due to hyponatremia  -Na 136, ALT/AST 21/36, alk phos 52  -Repeat CMP on 8/15 with Na 128  --Will repeat CMP 8/17. -Discontinue home Cymbalta; completed taper -Continue home gabapentin 100 mg 3 times daily for anxiety -Continue trazodone 50 to 100 mg nightly for insomnia -Recommend therapy, DBT or CBT (if options limited) upon discharge; patient agreeable.     PRN: Additional trazodone 50 mg PRN insomnia.   -- The risks/benefits/side-effects/alternatives to this medication were discussed in detail with the patient and time was given for questions. The patient consents to medication trial.              -- Metabolic profile and EKG monitoring obtained while on an atypical antipsychotic  BMI: 23.14 Lipid Panel: Total cholesterol 264, LDL 150 HbgA1c: 5.0% QTc: 427             -- Encouraged patient to participate in unit milieu and in scheduled group therapies    3. Medical Diagnoses and Treatment: #Alcohol Use Disorder -Continue naltrexone 50 mg nightly -CIWA protocol for monitoring of withdrawal with po thiamine and MVI replacement and Ativan $RemoveBe'1mg'GDaczgwhj$  for scores >10   #Rash of lower abdomen -Continue triamcinolone cream 3 times daily as needed itching     4. Discharge Planning:              -- Social work and case management to assist with discharge planning and identification of [facility] follow-up needs prior to discharge             -- Estimated Date of Discharge: 8/17 or 8/18.             -- Discharge Concerns: Need to establish a safety plan             -- Discharge Goals: Return home with outpatient referrals for mental health follow-up including medication  management/psychotherapy    Rosezetta Schlatter, MD 07/04/2022, 7:13 AM

## 2022-07-05 ENCOUNTER — Encounter (HOSPITAL_COMMUNITY): Payer: Self-pay

## 2022-07-05 DIAGNOSIS — F411 Generalized anxiety disorder: Secondary | ICD-10-CM | POA: Diagnosis not present

## 2022-07-05 DIAGNOSIS — F1010 Alcohol abuse, uncomplicated: Secondary | ICD-10-CM | POA: Diagnosis not present

## 2022-07-05 DIAGNOSIS — F3132 Bipolar disorder, current episode depressed, moderate: Secondary | ICD-10-CM | POA: Diagnosis not present

## 2022-07-05 NOTE — BH IP Treatment Plan (Signed)
Interdisciplinary Treatment and Diagnostic Plan Update  07/05/2022 Time of Session: 9:30am  Debra [facility] MRN: [id number]  Principal Diagnosis: Bipolar 1 disorder, depressed, moderate ([place])  Secondary Diagnoses: Principal Problem:   Bipolar 1 disorder, depressed, moderate ([place]) Active Problems:   Alcohol abuse   Generalized anxiety disorder   Current Medications:  Current Facility-Administered Medications  Medication Dose Route Frequency Provider Last Rate Last Admin   acetaminophen (TYLENOL) tablet 650 mg  650 mg Oral Q6H PRN Suella Broad, FNP   650 mg at 07/04/22 1355   alum & mag hydroxide-simeth (MAALOX/MYLANTA) 200-200-20 MG/5ML suspension 30 mL  30 mL Oral Q4H PRN Starkes-Perry, Gayland Curry, FNP       ARIPiprazole (ABILIFY) tablet 10 mg  10 mg Oral Daily Rosezetta Schlatter, MD   10 mg at 07/05/22 0803   chewing gum (ORBIT) sugar free  1 Stick Oral TID Rosezetta Schlatter, MD   1 Stick at 07/03/22 1655   diphenhydrAMINE (BENADRYL) capsule 25 mg  25 mg Oral Daily PRN Bobbitt, Shalon E, NP       docusate (COLACE) 50 MG/5ML liquid 100 mg  100 mg Oral BID PRN Starkes-Perry, Gayland Curry, FNP       folic acid (FOLVITE) tablet 1 mg  1 mg Oral Daily Suella Broad, FNP   1 mg at 07/05/22 1700   gabapentin (NEURONTIN) capsule 100 mg  100 mg Oral TID Rosezetta Schlatter, MD   100 mg at 07/05/22 0803   loratadine (CLARITIN) tablet 10 mg  10 mg Oral Daily PRN Rosezetta Schlatter, MD   10 mg at 07/04/22 2115   magnesium hydroxide (MILK OF MAGNESIA) suspension 30 mL  30 mL Oral Daily PRN Suella Broad, FNP       multivitamin with minerals tablet 1 tablet  1 tablet Oral Daily Rosezetta Schlatter, MD   1 tablet at 07/04/22 1356   naltrexone (DEPADE) tablet 50 mg  50 mg Oral QHS Rosezetta Schlatter, MD   50 mg at 07/04/22 2115   thiamine (VITAMIN B1) tablet 100 mg  100 mg Oral Daily Suella Broad, FNP   100 mg at 07/05/22 0803   traZODone (DESYREL) tablet 50-100 mg  50-100 mg Oral QHS Rosezetta Schlatter, MD   100 mg at 07/04/22 2115   And   traZODone (DESYREL) tablet 50 mg  50 mg Oral QHS PRN Rosezetta Schlatter, MD   50 mg at 07/04/22 2210   triamcinolone cream (KENALOG) 0.1 % cream   Topical TID PRN Suella Broad, FNP   Given at 07/02/22 2130   PTA Medications: Medications Prior to Admission  Medication Sig Dispense Refill Last Dose   cetirizine (ZYRTEC) 10 MG tablet Take 10 mg by mouth daily.      DULoxetine (CYMBALTA) 30 MG capsule Take 1 capsule (30 mg total) by mouth daily. Take with a 60 mg capsule to equal total daily dose of 90 mg 90 capsule 1    DULoxetine (CYMBALTA) 60 MG capsule Take 1 capsule (60 mg total) by mouth daily. Take with a 30 mg capsule to equal total daily dose of 90 mg. 90 capsule 1    estradiol-norethindrone (COMBIPATCH) 0.05-0.14 MG/DAY Place 1 patch onto the skin once a week.      gabapentin (NEURONTIN) 100 MG capsule Take 100 mg in the morning and 100 mg at noon, then take (2) capsules 200 mg in the evening. (Patient taking differently: Take 100-200 mg by mouth See admin instructions. Take 100 mg in  the morning and 100 mg at noon, then take 200 mg in the evening.) 360 capsule 1    Multiple Vitamin (MULTIVITAMIN) tablet Take 1 tablet by mouth daily.      traZODone (DESYREL) 50 MG tablet Take 1-2 tabs at bedtime as needed for insomnia 180 tablet 1    triamcinolone cream (KENALOG) 0.1 % Apply topically 3 (three) times daily as needed (itching). 30 g 0     Patient Stressors: Health problems   Occupational concerns   Substance abuse    Patient Strengths: Ability for insight  Average or above average intelligence  Communication skills  General fund of knowledge   Treatment Modalities: Medication Management, Group therapy, Case management,  1 to 1 session with clinician, Psychoeducation, Recreational therapy.   Physician Treatment Plan for Primary Diagnosis: Bipolar 1 disorder, depressed, moderate ([place]) Long Term Goal(s): Improvement in symptoms  so as ready for discharge   Short Term Goals: Ability to identify changes in lifestyle to reduce recurrence of condition will improve Ability to verbalize feelings will improve Ability to disclose and discuss suicidal ideas Ability to demonstrate self-control will improve Ability to identify and develop effective coping behaviors will improve Ability to maintain clinical measurements within normal limits will improve Compliance with prescribed medications will improve Ability to identify triggers associated with substance abuse/mental health issues will improve  Medication Management: Evaluate patient's response, side effects, and tolerance of medication regimen.  Therapeutic Interventions: 1 to 1 sessions, Unit Group sessions and Medication administration.  Evaluation of Outcomes: Progressing  Physician Treatment Plan for Secondary Diagnosis: Principal Problem:   Bipolar 1 disorder, depressed, moderate ([place]) Active Problems:   Alcohol abuse   Generalized anxiety disorder  Long Term Goal(s): Improvement in symptoms so as ready for discharge   Short Term Goals: Ability to identify changes in lifestyle to reduce recurrence of condition will improve Ability to verbalize feelings will improve Ability to disclose and discuss suicidal ideas Ability to demonstrate self-control will improve Ability to identify and develop effective coping behaviors will improve Ability to maintain clinical measurements within normal limits will improve Compliance with prescribed medications will improve Ability to identify triggers associated with substance abuse/mental health issues will improve     Medication Management: Evaluate patient's response, side effects, and tolerance of medication regimen.  Therapeutic Interventions: 1 to 1 sessions, Unit Group sessions and Medication administration.  Evaluation of Outcomes: Progressing   RN Treatment Plan for Primary Diagnosis: Bipolar 1 disorder,  depressed, moderate ([place]) Long Term Goal(s): Knowledge of disease and therapeutic regimen to maintain health will improve  Short Term Goals: Ability to remain free from injury will improve, Ability to participate in decision making will improve, Ability to verbalize feelings will improve, Ability to disclose and discuss suicidal ideas, and Ability to identify and develop effective coping behaviors will improve  Medication Management: RN will administer medications as ordered by provider, will assess and evaluate patient's response and provide education to patient for prescribed medication. RN will report any adverse and/or side effects to prescribing provider.  Therapeutic Interventions: 1 on 1 counseling sessions, Psychoeducation, Medication administration, Evaluate responses to treatment, Monitor vital signs and CBGs as ordered, Perform/monitor CIWA, COWS, AIMS and Fall Risk screenings as ordered, Perform wound care treatments as ordered.  Evaluation of Outcomes: Progressing   LCSW Treatment Plan for Primary Diagnosis: Bipolar 1 disorder, depressed, moderate ([place]) Long Term Goal(s): Safe transition to appropriate next level of care at discharge, Engage patient in therapeutic group addressing interpersonal concerns.  Short  Term Goals: Engage patient in aftercare planning with referrals and resources, Increase social support, Increase emotional regulation, Facilitate acceptance of mental health diagnosis and concerns, Identify triggers associated with mental health/substance abuse issues, and Increase skills for wellness and recovery  Therapeutic Interventions: Assess for all discharge needs, 1 to 1 time with Social worker, Explore available resources and support systems, Assess for adequacy in community support network, Educate family and significant other(s) on suicide prevention, Complete Psychosocial Assessment, Interpersonal group therapy.  Evaluation of Outcomes: Progressing   Progress in  Treatment: Attending groups: Yes. Participating in groups: Yes. Taking medication as prescribed: Yes. Toleration medication: Yes. Family/Significant other contact made: Yes, will contact:  CSW will reach out to patient's daughter to completed SPE Patient understands diagnosis: Yes. Discussing patient identified problems/goals with staff: Yes. Medical problems stabilized or resolved: Yes. Denies suicidal/homicidal ideation: Yes. Issues/concerns per patient self-inventory: Yes. Other: none    New problem(s) identified: No, Describe:  none   New Short Term/Long Term Goal(s): Patient to work towards detox, medication management for mood stabilization; elimination of SI thoughts; development of comprehensive mental wellness/sobriety plan.   Patient Goals:  Patient states their goal for treatment is to "manage mood successfully so I have hope. . . Find a rehab and med management provider."   Discharge Plan or Barriers: No psychosocial barriers identified at this time, patient will be discharged home with children to go directly to rehab.                Reason for Continuation of Hospitalization: Medication Satbilization    Estimated Length of Stay: 1-7 days     Last 3 [place] Suicide Severity Risk Score: [place] Admission (Current) from 06/28/2022 in [place] [facility] ED to Hosp-Admission (Discharged) from 06/23/2022 in [place] ED from 06/21/2022 in [place] DEPT  C-SSRS RISK CATEGORY High Risk Error: Q2 is Yes, you must answer 3, 4, and 5 High Risk       Last PHQ 2/9 Scores:    02/22/2022    8:08 AM 10/26/2021   11:27 AM  Depression screen PHQ 2/9  Decreased Interest 0 2  Down, Depressed, Hopeless 0 2  PHQ - 2 Score 0 4  Altered sleeping 0 2  Tired, decreased energy 0 2  Change in appetite 0 1  Feeling bad or failure about yourself  0 2  Trouble concentrating 0 0  Moving slowly or  fidgety/restless 0 0  Suicidal thoughts 0 2  PHQ-9 Score 0 13  Difficult doing work/chores Not difficult at all     Scribe for Treatment Team: Darleen Crocker, Latanya Presser 07/05/2022 8:56 AM

## 2022-07-05 NOTE — Progress Notes (Signed)
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   Yesterday's recommendations per psychiatric team: - Will start Abilify 10 mg daily tomorrow morning for mood stabilization. -Discontinue Trileptal due to hyponatremia            -Na 136, ALT/AST 21/36, alk phos 52            -Repeat CMP on 8/15 with Na 128            --Will repeat CMP 8/17. -Discontinued home Cymbalta; completed taper -Continue home gabapentin 100 mg 3 times daily for anxiety -Continue trazodone 50 to 100 mg nightly for insomnia    On evaluation today: The patient reports that her mood is again less depressed.  Although, she did not sleep well due to roommate being "up and down" all night. She still feels motivated to prepare for discharge to go to rehab and is disappointed that she was unable to discharge today. However, she remains in good spirits. She denies somatic symptoms today including chills, lightheadedness, and chest and abdominal tightness. She reports intact appetite.  She denies SI (both active and passive), HI, AVH, and paranoia today.    Her goal for today is to talk with her boss about next steps, including continuing to take off time for rehabilitation. She reports that she would like to maintain honesty with him, as she typically offers half-truths about her recovery.   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   Procedure: TOTAL KNEE ARTHROPLASTY;  Surgeon: Olin, Matthew, MD;  Location: [facility];  Service: Orthopedics;  Laterality: Right;  70 mins   Family History:  Family History  Problem Relation Age of Onset   Anxiety disorder Sister    Depression Brother    Schizophrenia Brother    Suicidality Brother    Anxiety disorder Brother    Alcohol abuse Sister    Alcohol abuse Brother    Alcohol abuse Brother    Family Psychiatric  History: See H&P Social History:  Social History   Substance and Sexual Activity  Alcohol Use Yes   Comment: reports recent relapse on alcohol after multiple 3 months stints of sobriety.     Social History   Substance and Sexual Activity  Drug Use No    Social History   Socioeconomic History   Marital status: Divorced    Spouse name: Not on file   Number of children: Not on file   Years of education: Not on file   Highest education level: Not on file  Occupational History   Not on   file  Tobacco Use   Smoking status: Never   Smokeless tobacco: Never  Vaping Use   Vaping Use: Never used  Substance and Sexual Activity   Alcohol use: Yes    Comment: reports recent relapse on alcohol after multiple 3 months stints of sobriety.   Drug use: No   Sexual activity: Not Currently  Other Topics Concern   Not on file  Social History Narrative   Not on file   Social Determinants of Health   Financial Resource Strain: Not on file  Food Insecurity: Not on file  Transportation Needs: Not on file  Physical Activity: Not on file  Stress: Not on file  Social Connections: Not on  file   Additional Social History:         Sleep: Good  Appetite:  Good  Current Medications: Current Facility-Administered Medications  Medication Dose Route Frequency Provider Last Rate Last Admin   acetaminophen (TYLENOL) tablet 650 mg  650 mg Oral Q6H PRN Starkes-Perry, Takia S, FNP   650 mg at 07/04/22 1355   alum & mag hydroxide-simeth (MAALOX/MYLANTA) 200-200-20 MG/5ML suspension 30 mL  30 mL Oral Q4H PRN Starkes-Perry, Takia S, FNP       ARIPiprazole (ABILIFY) tablet 10 mg  10 mg Oral Daily , , MD   10 mg at 07/05/22 0803   chewing gum (ORBIT) sugar free  1 Stick Oral TID , , MD   1 Stick at 07/03/22 1655   diphenhydrAMINE (BENADRYL) capsule 25 mg  25 mg Oral Daily PRN Bobbitt, Shalon E, NP       docusate (COLACE) 50 MG/5ML liquid 100 mg  100 mg Oral BID PRN Starkes-Perry, Takia S, FNP       folic acid (FOLVITE) tablet 1 mg  1 mg Oral Daily Starkes-Perry, Takia S, FNP   1 mg at 07/05/22 0803   gabapentin (NEURONTIN) capsule 100 mg  100 mg Oral TID , , MD   100 mg at 07/05/22 0803   loratadine (CLARITIN) tablet 10 mg  10 mg Oral Daily PRN , , MD   10 mg at 07/04/22 2115   magnesium hydroxide (MILK OF MAGNESIA) suspension 30 mL  30 mL Oral Daily PRN Starkes-Perry, Takia S, FNP       multivitamin with minerals tablet 1 tablet  1 tablet Oral Daily , , MD   1 tablet at 07/04/22 1356   naltrexone (DEPADE) tablet 50 mg  50 mg Oral QHS , , MD   50 mg at 07/04/22 2115   thiamine (VITAMIN B1) tablet 100 mg  100 mg Oral Daily Starkes-Perry, Takia S, FNP   100 mg at 07/05/22 0803   traZODone (DESYREL) tablet 50-100 mg  50-100 mg Oral QHS , , MD   100 mg at 07/04/22 2115   And   traZODone (DESYREL) tablet 50 mg  50 mg Oral QHS PRN , , MD   50 mg at 07/04/22 2210   triamcinolone cream (KENALOG) 0.1 % cream   Topical TID PRN Starkes-Perry, Takia S, FNP   Given at 07/02/22 2130    Lab  Results:  Results for orders placed or performed during the hospital encounter of 06/28/22 (from the past 48 hour(s))  Comprehensive metabolic panel     Status: Abnormal   Collection Time: 07/04/22  6:45 AM  Result Value Ref Range   Sodium 128 (L) 135 - 145 mmol/L   Potassium 3.9 3.5 - 5.1 mmol/L   Chloride 94 (L) 98 - 111   mmol/L   CO2 26 22 - 32 mmol/L   Glucose, Bld 103 (H) 70 - 99 mg/dL    Comment: Glucose reference range applies only to samples taken after fasting for at least 8 hours.   BUN 9 8 - 23 mg/dL   Creatinine, Ser 0.65 0.44 - 1.00 mg/dL   Calcium 9.3 8.9 - 10.3 mg/dL   Total Protein 6.6 6.5 - 8.1 g/dL   Albumin 3.8 3.5 - 5.0 g/dL   AST 22 15 - 41 U/L   ALT 34 0 - 44 U/L   Alkaline Phosphatase 39 38 - 126 U/L   Total Bilirubin 0.9 0.3 - 1.2 mg/dL   GFR, Estimated >60 >60 mL/min    Comment: (NOTE) Calculated using the CKD-EPI Creatinine Equation (2021)    Anion gap 8 5 - 15    Comment: Performed at [facility], [place] [address] [place], [place] [address]     Blood Alcohol level:  Lab Results  Component Value Date   ETH 233 (H) 06/23/2022   ETH 250 (H) 88/89/1694    Metabolic Disorder Labs: Lab Results  Component Value Date   HGBA1C 5.0 06/30/2022   MPG 96.8 06/30/2022   MPG 105.41 10/27/2021   No results found for: "PROLACTIN" Lab Results  Component Value Date   CHOL 264 (H) 06/30/2022   TRIG 63 06/30/2022   HDL 101 06/30/2022   CHOLHDL 2.6 06/30/2022   VLDL 13 06/30/2022   LDLCALC 150 (H) 06/30/2022   LDLCALC 175 (H) 10/27/2021    Physical Findings: CIWA:  CIWA-Ar Total: 4  Musculoskeletal: Strength & Muscle Tone: within normal limits Gait & Station: normal Patient leans: N/A  Psychiatric Specialty Exam:  Presentation  General Appearance: Appropriate for Environment; Casual   Eye Contact:Good   Speech:Clear and Coherent; Normal Rate   Speech Volume:Normal   Handedness:Left    Mood and Affect  Mood: Less  depressed   Affect:Appropriate; Congruent    Thought Process  Thought Processes:Coherent; Linear; Goal Directed   Descriptions of Associations:Intact   Orientation:Full (Time, Place and Person)   Thought Content:Logical   History of Schizophrenia/Schizoaffective disorder:No   Duration of Psychotic Symptoms:N/A  Hallucinations:Denies   Ideas of Reference:None   Suicidal Thoughts:Denies   Homicidal Thoughts:Denies    Sensorium  Memory:Immediate Good; Recent Good   Judgment:Fair   Insight:Fair    Executive Functions  Concentration:Good   Attention Span:Good   Waterman of Knowledge:Good   Language:Good    Psychomotor Activity  Psychomotor Activity:Normal    Assets  Assets:Communication Skills; Desire for Improvement; Financial Resources/Insurance; Housing; Resilience; Social Support; Vocational/Educational    Sleep  Sleep:Poor     Physical Exam: Physical Exam Vitals reviewed.  Constitutional:      General: She is not in acute distress.    Appearance: She is not toxic-appearing.  HENT:     Head: Normocephalic and atraumatic.     Mouth/Throat:     Mouth: Mucous membranes are moist.     Pharynx: Oropharynx is clear.  Pulmonary:     Effort: Pulmonary effort is normal.  Skin:    General: Skin is warm and dry.     Findings: No rash.  Neurological:     General: No focal deficit present.     Mental Status: She is alert and oriented to person, place, and time. Mental status is at baseline.     Motor: No weakness.     Gait: Gait normal.    Review  of Systems  Constitutional:  Negative for chills, diaphoresis and fever.  HENT:  Negative for sore throat.   Eyes:        Floaters  Respiratory:  Negative for shortness of breath.   Cardiovascular:  Negative for chest pain.  Gastrointestinal: Negative.   Genitourinary: Negative.   Musculoskeletal: Negative.   Skin:  Negative for itching and rash.  Neurological:   Negative for dizziness, tremors, sensory change and headaches.   Blood pressure 110/70, pulse 93, temperature 98.5 F (36.9 C), temperature source Oral, resp. rate 18, height 5' 4" (1.626 m), weight 61.1 kg, SpO2 98 %. Body mass index is 23.14 kg/m.   Treatment Plan Summary: ASSESSMENT: Principal Problem:   Bipolar 1 disorder, depressed, moderate (HCC) Active Problems:   Alcohol abuse   Generalized anxiety disorder       PLAN: Safety and Monitoring:             -- Involuntary admission to inpatient psychiatric unit for safety, stabilization and treatment             -- Daily contact with patient to assess and evaluate symptoms and progress in treatment             -- Patient's case to be discussed in multi-disciplinary team meeting             -- Observation Level : q15 minute checks             -- Vital signs:  q12 hours             -- Precautions: suicide, elopement, and assault   2. Psychiatric Diagnoses and Treatment:  #Bipolar 1 Disorder, current episode depressed #Cluster B personality traits # Generalized anxiety disorder - Started Abilify 10 mg daily this morning for mood stabilization. -Previously discontinued Trileptal due to hyponatremia  -Na 136, ALT/AST 21/36, alk phos 52  -Repeat CMP on 8/15 with Na 128  --Will repeat BMP 8/17 AM. -Previously discontinued home Cymbalta; completed taper -Continue home gabapentin 100 mg 3 times daily for anxiety -Continue trazodone 50 to 100 mg nightly for insomnia -Recommend therapy, DBT or CBT (if options limited) upon discharge; patient agreeable.     PRN: Additional trazodone 50 mg PRN insomnia.   -- The risks/benefits/side-effects/alternatives to this medication were discussed in detail with the patient and time was given for questions. The patient consents to medication trial.              -- Metabolic profile and EKG monitoring obtained while on an atypical antipsychotic  BMI: 23.14 Lipid Panel: Total cholesterol 264,  LDL 150 HbgA1c: 5.0% QTc: 427             -- Encouraged patient to participate in unit milieu and in scheduled group therapies    3. Medical Diagnoses and Treatment: #Alcohol Use Disorder -Continue naltrexone 50 mg nightly -CIWA protocol for monitoring of withdrawal with po thiamine and MVI replacement and Ativan 1mg for scores >10   #Rash of lower abdomen, resolved     4. Discharge Planning:              -- Social work and case management to assist with discharge planning and identification of hospital follow-up needs prior to discharge             -- Estimated Date of Discharge: 8/17 or 8/18.             -- Discharge Concerns: Need to establish a safety plan             --   Discharge Goals: Return home with outpatient referrals for mental health follow-up including medication management/psychotherapy     , MD 07/05/2022, 9:13 AM   

## 2022-07-05 NOTE — Plan of Care (Signed)
Patient observed in room this morning reading her book. Patient noted to be in happy mood and cooperative. Med compliant although reported not sleeping too well last night due to roommate vomiting and feeling unwell. Patient stated despite not sleeping well energy level feels like normal and rated depression a 1 out of 10 and anxiety a 3 out of 10. Patients goal for the day is having a positive attitude and getting questions answered regarding discharging. Social worker notified of patient's request. Patient also stated she would speak with MD regarding her ADHD medication (Strattera). Patient verbalized no further questions/concerns at this moment and denies SI,HI and A/V/H with no plan/intent.   Problem: Health Behavior/Discharge Planning: Goal: Identification of resources available to assist in meeting health care needs will improve Outcome: Progressing   Problem: Education: Goal: Ability to state activities that reduce stress will improve Outcome: Progressing   Problem: Education: Goal: Knowledge of disease or condition will improve Outcome: Progressing

## 2022-07-05 NOTE — Group Note (Unsigned)
Date:  07/05/2022 Time:  4:35 PM  Group Topic/Focus:  Orientation:   The focus of this group is to educate the patient on the purpose and policies of crisis stabilization and provide a format to answer questions about their admission.  The group details unit policies and expectations of patients while admitted.     Participation Level:  {[facility] PARTICIPATION LTJQZ:00923}  Participation Quality:  {[facility] PARTICIPATION QUALITY:22265}  Affect:  {[facility] AFFECT:22266}  Cognitive:  {[facility] COGNITIVE:22267}  Insight: {[facility] Insight2:20797}  Engagement in Group:  {[facility] ENGAGEMENT IN RAQTM:22633}  Modes of Intervention:  {[facility] MODES OF INTERVENTION:22269}  Additional Comments:  ***  Jerrye Beavers 07/05/2022, 4:35 PM

## 2022-07-05 NOTE — Group Note (Signed)
LCSW Group Therapy Note   Group Date: 07/05/2022 Start Time: 1300 End Time: 1400  Type of Therapy and Topic:  Group Therapy:  Feelings About Hospitalization  Participation Level:  Active   Description of Group This process group involved patients discussing their feelings related to being hospitalized, as well as the benefits they see to being in the hospital.  These feelings and benefits were itemized.  The group then brainstormed specific ways in which they could seek those same benefits when they discharge and return home.  Therapeutic Goals Patient will identify and describe positive and negative feelings related to hospitalization Patient will verbalize benefits of hospitalization to themselves personally Patients will brainstorm together ways they can obtain similar benefits in the outpatient setting, identify barriers to wellness and possible solutions  Summary of Patient Progress:   Pt actively engaged in a discussion about communication barriers and brainstormed ways to potentially overcome such barriers. Patient proved open to input from peers and feedback from [place]. Patient demonstrated good insight into the subject matter, was respectful of peers, and participated/remained present throughout the entire session.   Therapeutic Modalities Cognitive Behavioral Therapy Motivational Interviewing  [name] 07/05/2022  3:37 PM

## 2022-07-05 NOTE — Group Note (Unsigned)
Date:  07/05/2022 Time:  4:26 PM  Group Topic/Focus:  Orientation:   The focus of this group is to educate the patient on the purpose and policies of crisis stabilization and provide a format to answer questions about their admission.  The group details unit policies and expectations of patients while admitted.     Participation Level:  {[facility] PARTICIPATION LTYVD:73225}  Participation Quality:  {[facility] PARTICIPATION QUALITY:22265}  Affect:  {[facility] AFFECT:22266}  Cognitive:  {[facility] COGNITIVE:22267}  Insight: {[facility] Insight2:20797}  Engagement in Group:  {[facility] ENGAGEMENT IN OHCSP:19802}  Modes of Intervention:  {[facility] MODES OF INTERVENTION:22269}  Additional Comments:  ***  Jerrye Beavers 07/05/2022, 4:26 PM

## 2022-07-05 NOTE — Group Note (Signed)
Date:  07/05/2022 Time:  4:27 PM  Group Topic/Focus:  Orientation:   The focus of this group is to educate the patient on the purpose and policies of crisis stabilization and provide a format to answer questions about their admission.  The group details unit policies and expectations of patients while admitted.    Participation Level:  Active  Participation Quality:  Appropriate  Affect:  Appropriate  Cognitive:  Appropriate  Insight: Appropriate  Engagement in Group:  Engaged  Modes of Intervention:  Discussion  Additional Comments:     Jerrye Beavers 07/05/2022, 4:27 PM

## 2022-07-05 NOTE — Group Note (Signed)
Date:  07/05/2022 Time:  4:42 PM  Group Topic/Focus:  Crisis Planning:   The purpose of this group is to help patients create a crisis plan for use upon discharge or in the future, as needed.    Participation Level:  Active  Participation Quality:  Appropriate  Affect:  Appropriate  Cognitive:  Appropriate  Insight: Appropriate  Engagement in Group:  Engaged  Modes of Intervention:  Education  Additional Comments:     Debra Rhodes 07/05/2022, 4:42 PM

## 2022-07-05 NOTE — Group Note (Signed)
Date:  07/05/2022 Time:  4:33 PM  Group Topic/Focus:  Orientation:   The focus of this group is to educate the patient on the purpose and policies of crisis stabilization and provide a format to answer questions about their admission.  The group details unit policies and expectations of patients while admitted.    Participation Level:  Active  Participation Quality:  Appropriate  Affect:  Appropriate  Cognitive:  Appropriate  Insight: Appropriate  Engagement in Group:  Engaged  Modes of Intervention:  Discussion  Additional Comments:     Jerrye Beavers 07/05/2022, 4:33 PM

## 2022-07-05 NOTE — Progress Notes (Signed)
D) Pt received calm, visible, participating in milieu, and in no acute distress. Pt A & O x4. Pt denies SI, HI, A/ V H, depression, anxiety and pain at this time. A) Pt encouraged to drink fluids. Pt encouraged to come to staff with needs. Pt encouraged to attend and participate in groups. Pt encouraged to set reachable goals.  R) Pt remained safe on unit, in no acute distress, will continue to assess.      07/05/22 1930  Psych Admission Type (Psych Patients Only)  Admission Status Involuntary  Psychosocial Assessment  Patient Complaints None  Eye Contact Fair  Facial Expression Anxious  Affect Appropriate to circumstance  Speech Logical/coherent  Interaction Assertive  Motor Activity Other (Comment) (unremarkable)  Appearance/Hygiene Unremarkable  Behavior Characteristics Appropriate to situation;Calm;Cooperative  Mood Pleasant;Euthymic  Thought Process  Coherency WDL  Content WDL  Delusions None reported or observed  Perception WDL  Hallucination None reported or observed  Judgment WDL  Confusion None  Danger to Self  Current suicidal ideation? Denies  Agreement Not to Harm Self Yes  Description of Agreement verbal  Danger to Others  Danger to Others None reported or observed

## 2022-07-05 NOTE — Progress Notes (Signed)
Adult Psychoeducational Group Note  Date:  07/05/2022 Time:  9:09 PM  Group Topic/Focus:  Wrap-Up Group:   The focus of this group is to help patients review their daily goal of treatment and discuss progress on daily workbooks.  Participation Level:  Active  Participation Quality:  Appropriate  Affect:  Appropriate  Cognitive:  Appropriate  Insight: Appropriate  Engagement in Group:  Engaged  Modes of Intervention:  Discussion  Additional Comments:    Barbette Hair 07/05/2022, 9:09 PM

## 2022-07-06 DIAGNOSIS — F3132 Bipolar disorder, current episode depressed, moderate: Secondary | ICD-10-CM | POA: Diagnosis not present

## 2022-07-06 DIAGNOSIS — F1010 Alcohol abuse, uncomplicated: Secondary | ICD-10-CM

## 2022-07-06 DIAGNOSIS — F411 Generalized anxiety disorder: Secondary | ICD-10-CM | POA: Diagnosis not present

## 2022-07-06 LAB — BASIC METABOLIC PANEL
Anion gap: 8 (ref 5–15)
BUN: 13 mg/dL (ref 8–23)
CO2: 26 mmol/L (ref 22–32)
Calcium: 9.8 mg/dL (ref 8.9–10.3)
Chloride: 101 mmol/L (ref 98–111)
Creatinine, Ser: 0.76 mg/dL (ref 0.44–1.00)
GFR, Estimated: >60 mL/min (ref >60)
Glucose, Bld: 113 mg/dL — ABNORMAL HIGH (ref 70–99)
Potassium: 4.4 mmol/L (ref 3.5–5.1)
Sodium: 135 mmol/L (ref 135–145)

## 2022-07-06 MED ORDER — TRAZODONE HCL 50 MG PO TABS: ORAL_TABLET | ORAL | 0 refills | Status: DC

## 2022-07-06 MED ORDER — GABAPENTIN 100 MG PO CAPS: 100.0000 mg | ORAL_CAPSULE | Freq: Three times a day (TID) | ORAL | 0 refills | Status: DC

## 2022-07-06 MED ORDER — ARIPIPRAZOLE 10 MG PO TABS: 10.0000 mg | ORAL_TABLET | Freq: Every day | ORAL | 0 refills | Status: DC

## 2022-07-06 MED ORDER — NALTREXONE HCL 50 MG PO TABS: 50.0000 mg | ORAL_TABLET | Freq: Every day | ORAL | 0 refills | Status: AC

## 2022-07-06 NOTE — Progress Notes (Signed)
Patient is discharging at this time. Patient is A&Ox4. Vs stable. Patient denies SI,HI, and A/V/H with no plan/intent. Printed AVS reviewed with and given to patient along with medications and follow up appointments. Patient verbalized all understanding. All valuables/belongings returned to patient. Patient is being transported by her sponsor. Patient denies any pain/discomfort. No s/s of current distress.

## 2022-07-06 NOTE — Discharge Summary (Signed)
Physician Discharge Summary Note  Patient:  Debra Rhodes is an 61 y.o., female MRN:  [id number] DOB:  15-Jun-1961 Patient phone:  [phone] (home)  Patient address:   [address] 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    On Chart Review: On chart review, patient was admitted to [facility] in December 2022 and August 2022 both times for [facility] after relapsing on alcohol and receives outpatient follow-up for therapy and medication managed with [facility]. She was also admitted to [facility] from 3/1 to 01/24/2021 for a suicide attempt by cutting her wrist in the context of alcohol relapse. Patient stated she was sober for 5 months after completing a rehab stay at [facility] after."  Principal Problem: Bipolar 1 disorder, depressed, moderate ([place]) Discharge Diagnoses: Principal Problem:   Bipolar 1 disorder, depressed, moderate ([place]) Active Problems:   Alcohol abuse   Generalized anxiety disorder    Past Psychiatric History:  Previous Psych Diagnoses: depression, alcohol dependence Prior inpatient treatment: [facility] in March, August, and December 2022. Prior outpatient treatment: [facility] Prior rehab hx: Detox and rehab Psychotherapy hx: Previous therapy.  History of suicide: SI, no prior  History of homicide: Denies Psychiatric medication history: "Paxil- Has taken it since 61 yo. Has had times where it has been less effective. Had wt gain and sexual side effects at higher doses (40 mg and 60  mg); Prozac- felt more anxious; Sertraline; Cymbalta- Started on 40 mg and ws increased to 60 mg; Wellbutrin- increased irritability; Trazodone-helpful but causes some excessive somnolence, especially at 100 mg dose; Remeron- Effective, wt gain; Hydroxyzine; Gabapentin- Takes as needed for anxiety and leg cramps. Occ word finding errors with more than 300 mg; Buspar-Increased agitation and anxiety; Concerta- Had increased anxiety; Strattera- Effective but feels "wired"; Dayvigo-effective; Rexulti- increased fatigue at 2 mg; Oneida Alar PM- taking for ADHD, effective." Psychiatric medication compliance history: Neuromodulation history: Denies Current Psychiatrist: [facility] Current therapist: Denies  Past Medical History:  Past Medical History:  Diagnosis Date   Anxiety    Depression    PONV (postoperative nausea and vomiting)     Past Surgical History:  Procedure Laterality Date   AUGMENTATION MAMMAPLASTY Bilateral 1996   BACK SURGERY     CESAREAN SECTION     FRACTURE SURGERY     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  Father-substance issues.   Unspecified psychiatric problems with others in her family.    Social History:  Social History   Substance and Sexual Activity  Alcohol Use  Yes   Comment: reports recent relapse on alcohol after multiple 3 months stints of sobriety.     Social History   Substance and Sexual Activity  Drug Use No    Social  History   Socioeconomic History   Marital status: Divorced    Spouse name: Not on file   Number of children: Not on file   Years of education: Not on file   Highest education level: Not on file  Occupational History   Not on file  Tobacco Use   Smoking status: Never   Smokeless tobacco: Never  Vaping Use   Vaping Use: Never used  Substance and Sexual Activity   Alcohol use: Yes    Comment: reports recent relapse on alcohol after multiple 3 months stints of sobriety.   Drug use: No   Sexual activity: Not Currently  Other Topics Concern   Not on file  Social History Narrative   Not on file   Social Determinants of Health   Financial Resource Strain: Not on file  Food Insecurity: Not on file  Transportation Needs: Not on file  Physical Activity: Not on file  Stress: Not on file  Social Connections: Not on file    Hospital Course:    During the patient's hospitalization, patient had extensive initial psychiatric evaluation, and follow-up psychiatric evaluations every day.  Psychiatric diagnoses provided upon initial assessment:  Principal Problem:   Bipolar 1 disorder, depressed, moderate (H[facility]) Active Problems:   Alcohol abuse   Generalized anxiety disorder  Patient's psychiatric medications were adjusted on admission:  -Will start mood stabilizing agent for patient, consider Trileptal -Will decrease Cymbalta -Recommend therapy, DBT or CBT (if options limited) upon discharge; patient agreeable.  -Will restart Naltrexone 50 mg -CIWA protocol for monitoring of withdrawal with po thiamine and MVI replacement and Ativan '1mg'$  for scores >10 -Trazodone 50 mg PRN insomnia.   During the hospitalization, other adjustments were made to the patient's psychiatric medication regimen:  -After discontinuing Cymbalta 90 mg, restarted at 30 mg x 2 days and 20 mg x 1 day after patient became symptomatic.  -Home gabapentin was discontinued when naltrexone was started but restarted at  100 mg TID for anxiety.  -Trileptal was initiated at 150 mg BID and increased to 300 mg BID before discontinuation (see below). -Abilify 10 mg daily started for mood stabilization in bipolar disorder.  Patient's care was discussed during the interdisciplinary team meeting every day during the hospitalization.  The patient experienced lightheadedness after abrupt discontinuation of Cymbalta. She also had hyponatremia to 128 after starting Trileptal, which caused dizziness, chills, and chest and abdominal tightness. Trileptal was discontinued and Abilify restarted without adverse effects.    Gradually, patient started adjusting to milieu. The patient was evaluated each day by a clinical provider to ascertain response to treatment. Improvement was noted by the patient's report of decreasing symptoms, improved sleep and appetite, affect, medication tolerance, behavior, and participation in unit programming.  Patient was asked each day to complete a self inventory noting mood, mental status, pain, new symptoms, anxiety and concerns.   Symptoms were reported as significantly decreased or resolved completely by discharge.  The patient reports that their mood is stable.  The patient denied having suicidal thoughts for more than 48 hours prior to discharge.  Patient denies having homicidal thoughts.  Patient denies having auditory hallucinations.  Patient denies any visual hallucinations or other symptoms of psychosis.  The patient was motivated to continue taking medication with a goal of  continued improvement in mental health.   The patient reports their target psychiatric symptoms of depression, SI, and anxiety responded well to the psychiatric medications, and the patient reports overall benefit other psychiatric hospitalization. Supportive psychotherapy was provided to the patient. The patient also participated in regular group therapy while hospitalized. Coping skills, problem solving as well as  relaxation therapies were also part of the unit programming.  Labs were reviewed with the patient, and abnormal results were discussed with the patient.  The patient is able to verbalize their individual safety plan to this provider.  # It is recommended to the patient to continue psychiatric medications as prescribed, after discharge from the hospital.    # It is recommended to the patient to follow up with your outpatient psychiatric provider and PCP.  # It was discussed with the patient, the impact of alcohol, drugs, tobacco have been there overall psychiatric and medical wellbeing, and total abstinence from substance use was recommended the patient.ed.  # Prescriptions provided or sent directly to preferred pharmacy at discharge. Patient agreeable to plan. Given opportunity to ask questions. Appears to feel comfortable with discharge.    # In the event of worsening symptoms, the patient is instructed to call the crisis hotline, 911 and or go to the nearest ED for appropriate evaluation and treatment of symptoms. To follow-up with primary care provider for other medical issues, concerns and or health care needs  # Patient was discharged home with a plan to follow up as noted below.  Physical Findings: CIWA:  CIWA-Ar Total: 4  Musculoskeletal: Strength & Muscle Tone: within normal limits Gait & Station: normal Patient leans: N/A   Psychiatric Specialty Exam:  Presentation  General Appearance: Appropriate for Environment; Casual; Fairly Groomed   Eye Contact:Good   Speech:Clear and Coherent; Normal Rate   Speech Volume:Normal   Handedness:Left    Mood and Affect  Mood:Anxious; Euthymic   Affect:Appropriate; Congruent; Full Range    Thought Process  Thought Processes:Linear   Descriptions of Associations:Intact   Orientation:Full (Time, Place and Person)   Thought Content:Logical   History of Schizophrenia/Schizoaffective disorder:No   Duration of  Psychotic Symptoms:No data recorded Hallucinations:Hallucinations: None   Ideas of Reference:None   Suicidal Thoughts:Suicidal Thoughts: No   Homicidal Thoughts:Homicidal Thoughts: No    Sensorium  Memory:Immediate Good; Recent Good; Remote Good   Judgment:Fair   Insight:Fair    Executive Functions  Concentration:Fair   Attention Span:Fair   Recall:Good   Fund of Knowledge:Good   Language:Good    Psychomotor Activity  Psychomotor Activity:Psychomotor Activity: Normal    Assets  Assets:Communication Skills; Desire for Improvement; Financial Resources/Insurance; Housing; Resilience; Social Support; Vocational/Educational    Sleep  Sleep:Sleep: Fair     Physical Exam: Vitals reviewed.  Constitutional:      General: She is not in acute distress.    Appearance: She is not toxic-appearing.  HENT:     Head: Normocephalic and atraumatic.     Mouth/Throat:     Mouth: Mucous membranes are moist.     Pharynx: Oropharynx is clear.  Pulmonary:     Effort: Pulmonary effort is normal.  Skin:    General: Skin is warm and dry.     Findings: No rash.  Neurological:     General: No focal deficit present.     Mental Status: She is alert and oriented to person, place, and time. Mental status is at baseline.     Motor: No weakness.     Gait: Gait  normal.   Review of Systems  Constitutional:  Negative for chills, diaphoresis and fever.  HENT:  Negative for sore throat.   Eyes:  Negative for visual disturbances Respiratory:  Negative for shortness of breath.   Cardiovascular:  Negative for chest pain.  Gastrointestinal: Negative.   Genitourinary: Negative.   Musculoskeletal: Negative.   Skin:  Negative for itching and rash.  Neurological:  Negative for dizziness, tremors, sensory change and headaches.  Blood pressure 100/67, pulse (!) 104, temperature 98.7 F (37.1 C), temperature source Oral, resp. rate 18, height '5\' 4"'$  (1.626 m), weight 61.1 kg,  SpO2 98 %. Body mass index is 23.14 kg/m.   Social History   Tobacco Use  Smoking Status Never  Smokeless Tobacco Never   Tobacco Cessation:  N/A, patient does not currently use tobacco products   Blood Alcohol level:  Lab Results  Component Value Date   ETH 233 (H) 06/23/2022   ETH 250 (H) 78/29/5621    Metabolic Disorder Labs:  Lab Results  Component Value Date   HGBA1C 5.0 06/30/2022   MPG 96.8 06/30/2022   MPG 105.41 10/27/2021   No results found for: "PROLACTIN" Lab Results  Component Value Date   CHOL 264 (H) 06/30/2022   TRIG 63 06/30/2022   HDL 101 06/30/2022   CHOLHDL 2.6 06/30/2022   VLDL 13 06/30/2022   LDLCALC 150 (H) 06/30/2022   LDLCALC 175 (H) 10/27/2021    See Psychiatric Specialty Exam and Suicide Risk Assessment completed by Attending Physician prior to discharge.  Discharge destination:  Home, followed by rehabilitation. (Patient still trying to determine where to go for rehabilitation).   Is patient on multiple antipsychotic therapies at discharge:  No   Has Patient had three or more failed trials of antipsychotic monotherapy by history:  No  Recommended Plan for Multiple Antipsychotic Therapies: NA  Discharge Instructions     Diet - low sodium heart healthy   Complete by: As directed    Increase activity slowly   Complete by: As directed    No wound care   Complete by: As directed       Allergies as of 07/06/2022       Reactions   Latex Shortness Of Breath, Swelling, Rash        Medication List     STOP taking these medications    DULoxetine 30 MG capsule Commonly known as: CYMBALTA   DULoxetine 60 MG capsule Commonly known as: Cymbalta   triamcinolone cream 0.1 % Commonly known as: KENALOG       TAKE these medications      Indication  ARIPiprazole 10 MG tablet Commonly known as: ABILIFY Take 1 tablet (10 mg total) by mouth daily. Start taking on: July 07, 2022  Indication: MIXED BIPOLAR AFFECTIVE  DISORDER   cetirizine 10 MG tablet Commonly known as: ZYRTEC Take 10 mg by mouth daily.  Indication: Hayfever   estradiol-norethindrone 0.05-0.14 MG/DAY Commonly known as: COMBIPATCH Place 1 patch onto the skin once a week.  Indication: home med   gabapentin 100 MG capsule Commonly known as: NEURONTIN Take 1 capsule (100 mg total) by mouth 3 (three) times daily. What changed:  how much to take how to take this when to take this additional instructions  Indication: Abuse or Misuse of Alcohol, Alcohol Withdrawal Syndrome, Neuropathic Pain, Social Anxiety Disorder   multivitamin tablet Take 1 tablet by mouth daily.  Indication: Nutritional Support   naltrexone 50 MG tablet Commonly known as: DEPADE Take 1  tablet (50 mg total) by mouth at bedtime.  Indication: Abuse or Misuse of Alcohol   traZODone 50 MG tablet Commonly known as: DESYREL Take 1-2 tabs at bedtime as needed for insomnia  Indication: [place] Follow up.   Specialty: Catering manager information: [place] [id number] [place]. Go on 07/11/2022.   Specialty: Behavioral Health Why: You have an appointment for medication management services on 07/11/22 at 3:30 pm.  You also have an appointment for therapy services on 07/18/22 at 3:00 pm.  These appointments will be held in person.  * Form of payment will be self-pay. Contact information: [address], [place] [id number]                Follow-up recommendations:   Activity: as tolerated  Diet: heart healthy  Other: -Follow-up with your outpatient psychiatric provider -instructions on appointment date, time, and address (location) are provided to you in discharge paperwork.  -Take your psychiatric medications as prescribed at discharge -  instructions are provided to you in the discharge paperwork  -Follow-up with outpatient primary care doctor and other specialists -for management of chronic medical disease, including: Hyperlipidemia  -Testing: Follow-up with outpatient provider for abnormal lab results: Total cholesterol 264, LDL 150  -Recommend abstinence from alcohol, tobacco, and other illicit drug use at discharge.   -If your psychiatric symptoms recur, worsen, or if you have side effects to your psychiatric medications, call your outpatient psychiatric provider, 911, [phone] or go to the nearest emergency department.  -If suicidal thoughts recur, call your outpatient psychiatric provider, 911, [phone] or go to the nearest emergency department.  Signed: Rosezetta Schlatter, MD 07/06/2022, 10:08 AM

## 2022-07-06 NOTE — Progress Notes (Signed)
  [facility] Adult Case Management Discharge Plan :  Will you be returning to the same living situation after discharge:  No. At discharge, do you have transportation home?: Pt plans to go to rehab  Do you have the ability to pay for your medications: Yes,  family support   Release of information consent forms completed and in the chart;  Patient's signature needed at discharge.  Patient to Follow up at:  Follow-up Information     [place] Follow up.   Specialty: Catering manager information: [place] [phone] [place]. Go on 07/11/2022.   Specialty: Behavioral Health Why: You have an appointment for medication management services on 07/11/22 at 3:30 pm.  You also have an appointment for therapy services on 07/18/22 at 3:00 pm.  These appointments will be held in person.  * Form of payment will be self-pay. Contact information: [address] [place]. Go to.   Why: Go to this facility on 8/18 for admittance into program for residential treatment. Contact information: [place] [address] (ph) [phone] (f) [phone]                Next level of care provider has access to [place] and Suicide Prevention discussed: Yes,  daughters      Has patient been referred to the Quitline?: N/A patient is not a smoker  Patient has been referred for addiction treatment: Yes, residential rehab at [facility].  [place], LCSW 07/06/2022, 11:43 AM

## 2022-07-06 NOTE — BHH Suicide Risk Assessment (Signed)
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   During the patient's hospitalization, patient had extensive initial psychiatric evaluation, and follow-up psychiatric evaluations every day.  Psychiatric diagnoses provided upon initial assessment:  Principal Problem:   Bipolar 1 disorder, depressed, moderate ([facility]) Active Problems:   Alcohol abuse   Generalized anxiety disorder  Patient's psychiatric medications were adjusted on admission:  -Will start mood stabilizing agent for patient, consider Trileptal -Will decrease Cymbalta -Recommend therapy, DBT or CBT (if options limited) upon discharge; patient agreeable.  -Will restart Naltrexone 50 mg -CIWA protocol for monitoring of withdrawal with po thiamine and MVI replacement and Ativan '1mg'$  for scores >10 -Trazodone 50 mg PRN insomnia.   During the hospitalization, other adjustments were made to the patient's psychiatric medication regimen:  -After discontinuing Cymbalta 90 mg, restarted at 30 mg x 2 days and 20 mg x 1 day after patient became symptomatic.  -Home gabapentin was discontinued when naltrexone was started but restarted at 100 mg TID for anxiety.  -Trileptal was initiated at 150 mg BID and increased to 300 mg BID before discontinuation (see below). -Abilify 10 mg daily started for mood stabilization in bipolar  disorder.  Patient's care was discussed during the interdisciplinary team meeting every day during the hospitalization.  The patient experienced lightheadedness after abrupt discontinuation of Cymbalta. She also had hyponatremia to 128 after starting Trileptal, which caused dizziness, chills, and chest and abdominal tightness. Trileptal was discontinued and Abilify restarted without adverse effects.    Gradually, patient started adjusting to milieu. The patient was evaluated each day by a clinical provider to ascertain response to treatment. Improvement was noted by the patient's report of decreasing symptoms, improved sleep and appetite, affect, medication tolerance, behavior, and participation in unit programming.  Patient was asked each day to complete a self inventory noting mood, mental status, pain, new symptoms, anxiety and concerns.   Symptoms were reported as significantly decreased or resolved completely by discharge.  The patient reports that their mood is stable.  The patient denied having suicidal thoughts for more than 48 hours prior to discharge.  Patient denies having homicidal thoughts.  Patient denies having auditory hallucinations.  Patient denies any visual hallucinations or other symptoms of psychosis.  The patient was motivated to continue taking medication with a goal of continued improvement in mental health.   The patient reports their target psychiatric symptoms of depression, SI, and anxiety responded well to the psychiatric medications, and the patient reports overall benefit other psychiatric hospitalization. Supportive psychotherapy was provided to the patient. The patient also participated in regular group therapy while hospitalized. Coping skills, problem solving as well as relaxation therapies were also part of the unit programming.  Labs were reviewed with the patient, and abnormal results were discussed with the patient.  The patient is able to verbalize their  individual safety plan to this provider.  # It is recommended to the patient to continue psychiatric medications as prescribed, after discharge from the hospital.    #  It is recommended to the patient to follow up with your outpatient psychiatric provider and PCP.  # It was discussed with the patient, the impact of alcohol, drugs, tobacco have been there overall psychiatric and medical wellbeing, and total abstinence from substance use was recommended the patient.ed.  # Prescriptions provided or sent directly to preferred pharmacy at discharge. Patient agreeable to plan. Given opportunity to ask questions. Appears to feel comfortable with discharge.    # In the event of worsening symptoms, the patient is instructed to call the crisis hotline, 911 and or go to the nearest ED for appropriate evaluation and treatment of symptoms. To follow-up with primary care provider for other medical issues, concerns and or health care needs  # Patient was discharged home with a plan to follow up as noted below.  Total Time spent with patient: 30 minutes  Musculoskeletal: Strength & Muscle Tone: within normal limits Gait & Station: normal Patient leans: N/A  Psychiatric Specialty Exam  Presentation  General Appearance: Appropriate for Environment; Casual; Fairly Groomed  Eye Contact:Good  Speech:Clear and Coherent; Normal Rate  Speech Volume:Normal  Handedness:Left   Mood and Affect  Mood:Anxious; Euthymic  Duration of Depression Symptoms: Greater than two weeks  Affect:Appropriate; Congruent; Full Range   Thought Process  Thought Processes:Linear  Descriptions of Associations:Intact  Orientation:Full (Time, Place and Person)  Thought Content:Logical  History of Schizophrenia/Schizoaffective disorder:No  Duration of Psychotic Symptoms:No data recorded Hallucinations:Hallucinations: None  Ideas of Reference:None  Suicidal Thoughts:Suicidal Thoughts: No  Homicidal  Thoughts:Homicidal Thoughts: No   Sensorium  Memory:Immediate Good; Recent Good; Remote Good  Judgment:Fair  Insight:Fair   Executive Functions  Concentration:Fair  Attention Span:Fair  Recall:Good  Fund of Knowledge:Good  Language:Good   Psychomotor Activity  Psychomotor Activity:Psychomotor Activity: Normal  Assets  Assets:Communication Skills; Desire for Improvement; Financial Resources/Insurance; Housing; Resilience; Social Support; Vocational/Educational   Sleep  Sleep:Sleep: Fair  Physical Exam: Vitals reviewed.  Constitutional:      General: She is not in acute distress.    Appearance: She is not toxic-appearing.  HENT:     Head: Normocephalic and atraumatic.     Mouth/Throat:     Mouth: Mucous membranes are moist.     Pharynx: Oropharynx is clear.  Pulmonary:     Effort: Pulmonary effort is normal.  Skin:    General: Skin is warm and dry.     Findings: No rash.  Neurological:     General: No focal deficit present.     Mental Status: She is alert and oriented to person, place, and time. Mental status is at baseline.     Motor: No weakness.     Gait: Gait normal.    Review of Systems  Constitutional:  Negative for chills, diaphoresis and fever.  HENT:  Negative for sore throat.   Eyes:  Negative for visual disturbances Respiratory:  Negative for shortness of breath.   Cardiovascular:  Negative for chest pain.  Gastrointestinal: Negative.   Genitourinary: Negative.   Musculoskeletal: Negative.   Skin:  Negative for itching and rash.  Neurological:  Negative for dizziness, tremors, sensory change and headaches.   Blood pressure 100/67, pulse (!) 104, temperature 98.7 F (37.1 C), temperature source Oral, resp. rate 18, height '5\' 4"'$  (1.626 m), weight 61.1 kg, SpO2 98 %. Body mass index is 23.14 kg/m.  Mental Status Per Nursing Assessment::   On Admission:  NA  Demographic Factors:  Divorced or widowed, Caucasian, and Living alone  Loss  Factors: Loss of  significant relationship and Financial problems/change in socioeconomic status  Historical Factors: Prior suicide attempts and Impulsivity  Risk Reduction Factors:   Sense of responsibility to family, Religious beliefs about death, Employed, Positive social support, Positive therapeutic relationship, and Positive coping skills or problem solving skills  Continued Clinical Symptoms:  Bipolar Disorder:   Depressive phase Alcohol/Substance Abuse/Dependencies More than one psychiatric diagnosis Previous Psychiatric Diagnoses and Treatments  Cognitive Features That Contribute To Risk:  None    Suicide Risk:  Mild: There are no identifiable plans, no associated intent, mild dysphoria and related symptoms, good self-control (both objective and subjective assessment), few other risk factors, and identifiable protective factors, including available and accessible social support.   Follow-up Information     [facility] Follow up.   Specialty: Catering manager information: [place] [id number] [place]. Go on 07/11/2022.   Specialty: Behavioral Health Why: You have an appointment for medication management services on 07/11/22 at 3:30 pm.  You also have an appointment for therapy services on 07/18/22 at 3:00 pm.  These appointments will be held in person.  * Form of payment will be self-pay. Contact information: [address] [place] [phone]                Plan Of Care/Follow-up recommendations:  Activity: as tolerated   Diet: heart healthy   Other: -Follow-up with your outpatient psychiatric provider -instructions on appointment date, time, and address (location) are provided to you in discharge paperwork.   -Take your psychiatric medications as prescribed at discharge - instructions are provided to you  in the discharge paperwork   -Follow-up with outpatient primary care doctor and other specialists -for management of chronic medical disease, including: Hyperlipidemia   -Testing: Follow-up with outpatient provider for abnormal lab results: Total cholesterol 264, LDL 150   -Recommend abstinence from alcohol, tobacco, and other illicit drug use at discharge.    -If your psychiatric symptoms recur, worsen, or if you have side effects to your psychiatric medications, call your outpatient psychiatric provider, 911, [phone] or go to the nearest emergency department.   -If suicidal thoughts recur, call your outpatient psychiatric provider, 911, [phone] or go to the nearest emergency department.  Rosezetta Schlatter, MD 07/06/2022, 10:09 AM

## 2022-07-06 NOTE — BHH Group Notes (Signed)
[place] Group Notes:  (Nursing/MHT/Case Management/Adjunct)  Date:  07/06/2022  Time:  11:40 AM  Type of Therapy:  Group Therapy  Participation Level:  Did Not Attend  Summary of Progress/Problems:  Patient did not attend goals group today.   Elza Rafter 07/06/2022, 11:40 AM

## 2022-07-11 ENCOUNTER — Ambulatory Visit: Payer: BC Managed Care – PPO | Admitting: Psychiatry

## 2022-07-18 ENCOUNTER — Ambulatory Visit: Payer: BC Managed Care – PPO | Admitting: Psychiatry

## 2022-07-31 ENCOUNTER — Encounter: Payer: Self-pay | Admitting: Psychiatry

## 2022-07-31 ENCOUNTER — Ambulatory Visit (INDEPENDENT_AMBULATORY_CARE_PROVIDER_SITE_OTHER): Payer: Self-pay | Admitting: Psychiatry

## 2022-07-31 DIAGNOSIS — F32A Depression, unspecified: Secondary | ICD-10-CM

## 2022-07-31 MED ORDER — LAMOTRIGINE 25 MG PO TABS: ORAL_TABLET | ORAL | 0 refills | Status: DC

## 2022-07-31 MED ORDER — LAMOTRIGINE 100 MG PO TABS: ORAL_TABLET | ORAL | 0 refills | Status: DC

## 2022-07-31 MED ORDER — GABAPENTIN 100 MG PO CAPS: 100.0000 mg | ORAL_CAPSULE | Freq: Three times a day (TID) | ORAL | 1 refills | Status: DC

## 2022-07-31 MED ORDER — PAROXETINE HCL 20 MG PO TABS: 20.0000 mg | ORAL_TABLET | Freq: Every day | ORAL | 1 refills | Status: DC

## 2022-07-31 MED ORDER — ARIPIPRAZOLE 15 MG PO TABS: 7.5000 mg | ORAL_TABLET | Freq: Every day | ORAL | 1 refills | Status: DC

## 2022-07-31 NOTE — Progress Notes (Signed)
Debra Rhodes [id number] [birth year] 61 y.o.  Subjective:   Patient ID:  Debra Rhodes is a 61 y.o. (DOB [birth year]) female.  Chief Complaint:  Chief Complaint  Patient presents with   Depression   Anxiety   Alcohol Problem    "I got very depressed and started drinking." She reports that she felt "like a burden to my girls because I keep relapsing and they would be better off without me." She took Librium, Ativan from a neighbor, and some remaining meds. She reports that she texted someone who found her. She went to ICU after overdose. She was then transferred to [facility] inpatient. She then went to rehab in [place], then stayed with family in [place].   She reports that she was started on Abilify 20 mg daily and had severe restlessness. Rehab providers reduced Abilify to 5 mg and she is now taking 7.5 mg daily. She reports minimal restlessness. They also started her on Paxil. She was started on Trileptal and developed hyponatremia.  She reports that her diagnosis was changed to Bipolar Disorder during hospitalization. She reports that she had a manic episode at the end of her first marriage. She reports that she was sleeping about 2-3 hours, running 5-6 miles a day, and had elevated energy for about 1.5 weeks. She reports that she has some mood lability. She reports that she has had moments of high energy and then will feel more tired the next few days. She reports some periods of impulsivity, "but I equate it with my addiction." She reports seasonal component to her illness with increased depression around the holidays.   She reports less impulsivity.   She reports that her anxiety has improved with Abilify. She reports that her depression has been improving some since re-starting Paxil 5 days ago. She has had some irritability. She required additional medication for sleep during rehab and has been able to resume Trazodone 50 mg qhs with adequate response. Energy and motivation have been ok.  Difficulty with concentration. Appetite has been ok. She reports that she has lost some weight. She reports that she "always craves ice cream." Denies SI.   Returning to work on Wednesday.   Plans to resume therapy with Rinaldo Cloud, LCSW.   Past Psychiatric Medication Trials: Paxil- Has taken it since 61 yo. Has had times where it has been less effective. Had wt gain and sexual side effects at higher doses (40 mg and 60 mg). Prozac- felt more anxious Sertraline Cymbalta- Started on 40 mg and ws increased to 60 mg. Wellbutrin- increased irritability Trazodone-helpful but causes some excessive somnolence, especially at 100 mg dose.  Remeron- Effective, wt gain Hydroxyzine Gabapentin- Takes as needed for anxiety and leg cramps. Occ word finding errors with more than 300 mg Buspar-Increased agitation and anxiety Concerta- Had increased anxiety Strattera- Effective but feels "wired" [place] Office Visit from 07/31/2022 in [place] Visit from 02/17/2022 in [place] Admission (Discharged) from 10/26/2021 in [facility] [facility] Admission (Discharged) from 01/18/2021 in [facility]  AIMS Total Score 0 1 0 0      AUDIT    Flowsheet Row Admission (Discharged) from 06/28/2022 in [facility] [facility] Admission (Discharged) from 10/26/2021 in [facility] Admission (Discharged) from 07/08/2021 in [facility] [facility] Admission (Discharged) from 01/18/2021 in [facility] [facility]  Alcohol Use  Disorder Identification Test Final Score (AUDIT) '22 6 7 25      '$ PHQ2-9    Flowsheet Row Office Visit from 02/22/2022 in [place] ED from 10/26/2021 in [facility]  PHQ-2 Total Score 0 4  PHQ-9 Total Score 0 13       Flowsheet Row Admission (Discharged) from 06/28/2022 in [facility] ED to Hosp-Admission (Discharged) from 06/23/2022 in [facility] ED from 06/21/2022 in [facility] [place] DEPT  C-SSRS RISK CATEGORY High Risk Error: Q2 is Yes, you must answer 3, 4, and 5 High Risk        Review of Systems:  Review of Systems  Musculoskeletal:  Negative for gait problem.  Neurological:  Negative for tremors.  Psychiatric/Behavioral:         Please refer to HPI    Medications: I have reviewed the patient's current medications.  Current Outpatient Medications  Medication Sig Dispense Refill   estradiol-norethindrone (COMBIPATCH) 0.05-0.14 MG/DAY Place 1 patch onto the skin once a week.     gabapentin (NEURONTIN) 100 MG capsule Take 1 capsule (100 mg total) by mouth 3 (three) times daily. 90 capsule 0   lamoTRIgine (LAMICTAL) 100 MG tablet Take 1 tab po qd x 2 weeks, then increase to 1.5 tabs po qd 45 tablet 0   lamoTRIgine (LAMICTAL) 25 MG tablet Take 1 tablet (25 mg total) by mouth daily for 14 days, THEN 2 tablets (50 mg total) daily for 14 days. 45 tablet 0   Multiple Vitamin (MULTIVITAMIN) tablet Take 1 tablet by mouth daily.     naltrexone (DEPADE) 50 MG tablet Take 1 tablet (50 mg total) by mouth at bedtime. 30 tablet 0   traZODone (DESYREL) 50 MG tablet Take 1-2 tabs at bedtime as needed for insomnia 60 tablet 0   ARIPiprazole (ABILIFY) 15 MG tablet Take 0.5 tablets (7.5 mg total) by mouth daily. 15 tablet 1   cetirizine (ZYRTEC) 10 MG tablet Take 10 mg by mouth daily. (Patient not taking: Reported on 07/31/2022)     PARoxetine (PAXIL) 20 MG tablet Take 1 tablet (20 mg total) by mouth daily. 30 tablet 1   No current facility-administered medications for this visit.    Medication Side Effects: Other: Recent restlessness that is improving  Allergies:  Allergies  Allergen Reactions   Latex Shortness Of Breath, Swelling  and Rash    Past Medical History:  Diagnosis Date   Anxiety    Depression    PONV (postoperative nausea and vomiting)     Past Medical History, Surgical history, Social history, and Family history were reviewed and updated as appropriate.   Please see review of systems for further details on the patient's review from today.   Objective:   Physical Exam:  There were no vitals taken for this visit.  Physical Exam Constitutional:      General: She is not in acute distress. Musculoskeletal:        General: No deformity.  Neurological:     Mental Status: She is alert and oriented to person, place, and time.     Coordination: Coordination normal.  Psychiatric:        Attention and Perception: Attention and perception normal. She does not perceive auditory or visual hallucinations.        Mood and Affect: Mood is not anxious. Affect is not labile, blunt, angry or inappropriate.        Speech: Speech  normal.        Behavior: Behavior normal.        Thought Content: Thought content normal. Thought content is not paranoid or delusional. Thought content does not include homicidal or suicidal ideation. Thought content does not include homicidal or suicidal plan.        Cognition and Memory: Cognition and memory normal.        Judgment: Judgment normal.     Comments: Insight intact Mood presents as mildly depressed     Lab Review:     Component Value Date/Time   NA 135 07/06/2022 0643   K 4.4 07/06/2022 0643   CL 101 07/06/2022 0643   CO2 26 07/06/2022 0643   GLUCOSE 113 (H) 07/06/2022 0643   BUN 13 07/06/2022 0643   CREATININE 0.76 07/06/2022 0643   CALCIUM 9.8 07/06/2022 0643   PROT 6.6 07/04/2022 0645   ALBUMIN 3.8 07/04/2022 0645   AST 22 07/04/2022 0645   ALT 34 07/04/2022 0645   ALKPHOS 39 07/04/2022 0645   BILITOT 0.9 07/04/2022 0645   GFRNONAA >60 07/06/2022 0643   GFRAA >60 05/14/2019 0317       Component Value Date/Time   WBC 4.2 06/26/2022 1030   RBC 3.69  (L) 06/26/2022 1030   HGB 11.9 (L) 06/26/2022 1030   HCT 35.5 (L) 06/26/2022 1030   PLT 165 06/26/2022 1030   MCV 96.2 06/26/2022 1030   MCH 32.2 06/26/2022 1030   MCHC 33.5 06/26/2022 1030   RDW 12.3 06/26/2022 1030   LYMPHSABS 1.2 06/25/2022 0221   MONOABS 0.6 06/25/2022 0221   EOSABS 0.2 06/25/2022 0221   BASOSABS 0.0 06/25/2022 0221    No results found for: "POCLITH", "LITHIUM"   No results found for: "PHENYTOIN", "PHENOBARB", "VALPROATE", "CBMZ"   .res Assessment: Plan:    Pt seen for 30 minutes and time spent discussing recent hospitalization, rehab treatment, medication changes, information about Bipolar D/O, and medications. Discussed akathisia and that restlessness may just now be improving since Abilify was decreased a few weeks ago. Will continue Abilify 7.5 mg po qd at this time since she reports that akathisia has almost resolved and seems to be helping with impulsivity and anxiety.  Discussed that SSRI's can exacerbate mood lability with Bipolar D/O and therefore would not recommend increasing Paxil. Discussed alternatives to Paxil to include Lamictal. Discussed continuing Paxil 20 mg at this time and then considering discontinuation once Lamictal is therapeutic. Counseled patient regarding potential benefits, risks, and side effects of Lamictal to include potential risk of Stevens-Johnson syndrome. Advised patient to stop taking Lamictal and contact office immediately if rash develops and to seek urgent medical attention if rash is severe and/or spreading quickly. Will start Lamictal 25 mg daily for 2 weeks, then increase to 50 mg daily for 2 weeks, then 100 mg daily for 2 weeks, then 150 mg daily for mood symptoms.  Continue Trazodone 50 mg 1-2 tabs po QHS prn insomnia.  Continue Gabapentin 100 mg po TID for anxiety.  Agree with plan to resume therapy with Rinaldo Cloud, LCSW.  Pt to follow-up with this provider in 6-8 weeks or sooner if clinically indicated.  Patient advised  to contact office with any questions, adverse effects, or acute worsening in signs and symptoms.   Debra Rhodes was seen today for depression, anxiety and alcohol problem.  Diagnoses and all orders for this visit:  Depression, unspecified depression type -     lamoTRIgine (LAMICTAL) 25 MG tablet; Take 1 tablet (25 mg  total) by mouth daily for 14 days, THEN 2 tablets (50 mg total) daily for 14 days. -     lamoTRIgine (LAMICTAL) 100 MG tablet; Take 1 tab po qd x 2 weeks, then increase to 1.5 tabs po qd -     ARIPiprazole (ABILIFY) 15 MG tablet; Take 0.5 tablets (7.5 mg total) by mouth daily. -     PARoxetine (PAXIL) 20 MG tablet; Take 1 tablet (20 mg total) by mouth daily.     Please see After Visit Summary for patient specific instructions.  Future Appointments  Date Time Provider [place]  09/11/2022 11:30 AM Thayer Headings, PMHNP [facility] None  09/28/2022  8:20 AM Jeanie Sewer, NP [facility]  11/30/2022 11:30 AM Thayer Headings, PMHNP [facility] None    No orders of the defined types were placed in this encounter.   -------------------------------

## 2022-08-07 ENCOUNTER — Other Ambulatory Visit: Payer: Self-pay | Admitting: Family

## 2022-08-07 ENCOUNTER — Telehealth: Payer: Self-pay | Admitting: Psychiatry

## 2022-08-07 DIAGNOSIS — Z1231 Encounter for screening mammogram for malignant neoplasm of breast: Secondary | ICD-10-CM

## 2022-08-07 NOTE — Telephone Encounter (Signed)
Pt lvm that she was experiencing side effects from the abilify. She said that is feeling extra tireed and feeling funny and achy. Please call her at [phone]

## 2022-08-08 NOTE — Telephone Encounter (Signed)
LVM to rtc 

## 2022-08-10 NOTE — Telephone Encounter (Signed)
She stated it came from the Abilify because she had these problems before starting Lamictal.She is still having restlessness

## 2022-08-10 NOTE — Telephone Encounter (Signed)
Attempted to reach pt to find out more information. Please contact pt to verify that side effects are from Abilify and not recent initiation of Lamictal. Is she continuing to experience some restlessness? Please advise her not to take Abilify for the next week and to call back with an update in one week about how she is doing in terms of side effects and mood.

## 2022-08-14 ENCOUNTER — Encounter: Payer: Self-pay | Admitting: *Deleted

## 2022-08-25 ENCOUNTER — Ambulatory Visit: Payer: Self-pay

## 2022-09-11 ENCOUNTER — Encounter: Payer: Self-pay | Admitting: Psychiatry

## 2022-09-11 ENCOUNTER — Ambulatory Visit (INDEPENDENT_AMBULATORY_CARE_PROVIDER_SITE_OTHER): Payer: Self-pay | Admitting: Psychiatry

## 2022-09-11 DIAGNOSIS — F411 Generalized anxiety disorder: Secondary | ICD-10-CM

## 2022-09-11 DIAGNOSIS — F32A Depression, unspecified: Secondary | ICD-10-CM

## 2022-09-11 DIAGNOSIS — G4700 Insomnia, unspecified: Secondary | ICD-10-CM

## 2022-09-11 MED ORDER — LAMOTRIGINE 100 MG PO TABS: 150.0000 mg | ORAL_TABLET | Freq: Every day | ORAL | 0 refills | Status: DC

## 2022-09-11 MED ORDER — TRAZODONE HCL 50 MG PO TABS: 50.0000 mg | ORAL_TABLET | Freq: Every evening | ORAL | 1 refills | Status: DC | PRN

## 2022-09-11 MED ORDER — PAROXETINE HCL 20 MG PO TABS: 20.0000 mg | ORAL_TABLET | Freq: Every day | ORAL | 1 refills | Status: DC

## 2022-09-11 NOTE — Progress Notes (Signed)
Debra Rhodes [id number] [birth year] 61 y.o.  Subjective:   Patient ID:  Debra Rhodes is a 61 y.o. (DOB 09-17-1961) female.  Chief Complaint:  Chief Complaint  Patient presents with   Follow-up    Anxiety, depression, insomnia    HPI Debra Rhodes presents to the office today for follow-up of anxiety, mood disturbance, and insomnia. She reports that she is doing "better... my mood is a lot better... the dark is gone." She increased Lamictal to 100 mg about a week ago. She has noticed some anxiety and this may be related to work demands. Denies any panic attacks. She reports that social anxiety is the same.   She reports "there are moments I have mild depression" that resolve with being distracted. Denies irritability. Denies elevated moods. She reports less impulsivity and shopping. Sleeping well. She reports that her energy and motivation have improved. She reports that a few weeks ago she was sleeping excessively and this seems to have resolved. Appetite has been good. Denies weight gain. Concentration is consistent with baseline. She reports that concentration is worse when feeling "overwhelmed." Enjoying things. Denies SI.   Denies any recent ETOH cravings. She noticed cravings the first 2 weeks of sobriety. She has a new sponsor and is going to meetings.   She has been making herself reach out to others. She is no longer in previous relationship.   Work has been busy "but not too overwhelming."   Has been using Gabapentin prn only, such as a social setting.   Past Psychiatric Medication Trials: Paxil- Has taken it since 61 yo. Has had times where it has been less effective. Had wt gain and sexual side effects at higher doses (40 mg and 60 mg). Prozac- felt more anxious Sertraline Cymbalta- Started on 40 mg and ws increased to 60 mg. Wellbutrin- increased irritability Trazodone-helpful but causes some excessive somnolence, especially at 100 mg dose.  Remeron- Effective, wt  gain Hydroxyzine Gabapentin- Takes as needed for anxiety and leg cramps. Occ word finding errors with more than 300 mg Buspar-Increased agitation and anxiety Concerta- Had increased anxiety Strattera- Effective but feels "wired" [facility] Office Visit from 07/31/2022 in [facility] Visit from 02/17/2022 in [facility] Total Score 0 1      PHQ2-9    [place] Visit from 02/22/2022 in [facility] from 10/26/2021 in [facility]  PHQ-2 Total Score 0 4  PHQ-9 Total Score 0 13      Flowsheet Row ED to Hosp-Admission (Discharged) from 06/23/2022 in [facility] from 06/21/2022 in [facility] from 10/26/2021 in [facility]  C-SSRS RISK CATEGORY Error: Q2 is Yes, you must answer 3, 4, and 5 High Risk High Risk        Review of Systems:  Review of Systems  Musculoskeletal:  Negative for gait problem.  Skin:  Negative for rash.  Neurological:  Negative for tremors.  Psychiatric/Behavioral:         Please refer to HPI    Medications: I have reviewed the patient's current medications.  Current Outpatient Medications  Medication Sig Dispense Refill   cetirizine (ZYRTEC) 10 MG tablet Take 10 mg by mouth daily.     estradiol-norethindrone (COMBIPATCH) 0.05-0.14 MG/DAY Place 1 patch onto the skin once a week.     Multiple Vitamin (MULTIVITAMIN) tablet Take  1 tablet by mouth daily.     gabapentin (NEURONTIN) 100 MG capsule Take 1 capsule (100 mg total) by mouth 3 (three) times daily. 90 capsule 1   lamoTRIgine (LAMICTAL) 100 MG tablet Take 1.5 tablets (150 mg total) by mouth daily. 135 tablet 0   PARoxetine (PAXIL) 20 MG tablet Take 1 tablet (20 mg total) by mouth daily. 90 tablet 1   traZODone (DESYREL) 50 MG tablet Take 1 tablet (50 mg total) by mouth at bedtime  as needed for sleep. Take 1-2 tabs at bedtime as needed for insomnia 90 tablet 1   No current facility-administered medications for this visit.    Medication Side Effects: Other: Possible sleepiness with Lamictal.   Allergies:  Allergies  Allergen Reactions   Latex Shortness Of Breath, Swelling and Rash    Past Medical History:  Diagnosis Date   Anxiety    Depression    PONV (postoperative nausea and vomiting)     Past Medical History, Surgical history, Social history, and Family history were reviewed and updated as appropriate.   Please see review of systems for further details on the patient's review from today.   Objective:   Physical Exam:  There were no vitals taken for this visit.  Physical Exam Constitutional:      General: She is not in acute distress. Musculoskeletal:        General: No deformity.  Neurological:     Mental Status: She is alert and oriented to person, place, and time.     Coordination: Coordination normal.  Psychiatric:        Attention and Perception: Attention and perception normal. She does not perceive auditory or visual hallucinations.        Mood and Affect: Affect is not labile, blunt, angry or inappropriate.        Speech: Speech normal.        Behavior: Behavior normal.        Thought Content: Thought content normal. Thought content is not paranoid or delusional. Thought content does not include homicidal or suicidal ideation. Thought content does not include homicidal or suicidal plan.        Cognition and Memory: Cognition and memory normal.        Judgment: Judgment normal.     Comments: Insight intact Mood presents as less depressed and less anxious compared to last exam     Lab Review:     Component Value Date/Time   NA 135 07/06/2022 0643   K 4.4 07/06/2022 0643   CL 101 07/06/2022 0643   CO2 26 07/06/2022 0643   GLUCOSE 113 (H) 07/06/2022 0643   BUN 13 07/06/2022 0643   CREATININE 0.76 07/06/2022 0643   CALCIUM 9.8  07/06/2022 0643   PROT 6.6 07/04/2022 0645   ALBUMIN 3.8 07/04/2022 0645   AST 22 07/04/2022 0645   ALT 34 07/04/2022 0645   ALKPHOS 39 07/04/2022 0645   BILITOT 0.9 07/04/2022 0645   GFRNONAA >60 07/06/2022 0643   GFRAA >60 05/14/2019 0317       Component Value Date/Time   WBC 4.2 06/26/2022 1030   RBC 3.69 (L) 06/26/2022 1030   HGB 11.9 (L) 06/26/2022 1030   HCT 35.5 (L) 06/26/2022 1030   PLT 165 06/26/2022 1030   MCV 96.2 06/26/2022 1030   MCH 32.2 06/26/2022 1030   MCHC 33.5 06/26/2022 1030   RDW 12.3 06/26/2022 1030   LYMPHSABS 1.2 06/25/2022 0221   MONOABS 0.6 06/25/2022 0221   EOSABS  0.2 06/25/2022 0221   BASOSABS 0.0 06/25/2022 0221    No results found for: "POCLITH", "LITHIUM"   No results found for: "PHENYTOIN", "PHENOBARB", "VALPROATE", "CBMZ"   .res Assessment: Plan:    Pt reports that she would like to continue titration of Lamictal since she is noticing some improvement in mood symptoms since increasing to the 100 mg dose. Will increase to 150 mg daily after taking 100 mg daily for 2 weeks.  Continue Paxil 20 mg po qd for depression and anxiety.  Continue Gabapentin 100 mg as needed for anxiety.  Continue Trazodone 50 mg at bedtime for insomnia. Agreed with her plan to attempt dose reduction of 1/2 tab since she notices less difficulty falling asleep since starting Lamictal.  Agree with plan to re-start therapy with Rinaldo Cloud, LCSW once insurance benefits begin.  Pt to follow-up in 2-3 months or sooner if clinically indicated.  Patient advised to contact office with any questions, adverse effects, or acute worsening in signs and symptoms.   Caytlin was seen today for follow-up.  Diagnoses and all orders for this visit:  Generalized anxiety disorder  Depression, unspecified depression type -     lamoTRIgine (LAMICTAL) 100 MG tablet; Take 1.5 tablets (150 mg total) by mouth daily. -     PARoxetine (PAXIL) 20 MG tablet; Take 1 tablet (20 mg total) by  mouth daily.  Insomnia, unspecified type -     traZODone (DESYREL) 50 MG tablet; Take 1 tablet (50 mg total) by mouth at bedtime as needed for sleep. Take 1-2 tabs at bedtime as needed for insomnia     Please see After Visit Summary for patient specific instructions.  Future Appointments  Date Time Provider [place]  09/22/2022  4:40 PM [facility] 3 [facility]  09/28/2022  8:20 AM Jeanie Sewer, NP [facility]  01/04/2023  1:30 PM Thayer Headings, PMHNP CP-CP None    No orders of the defined types were placed in this encounter.   -------------------------------

## 2022-09-22 ENCOUNTER — Ambulatory Visit
Admission: RE | Admit: 2022-09-22 | Discharge: 2022-09-22 | Disposition: A | Discharge status: Home / Self Care | Payer: Commercial Managed Care - HMO | Source: Ambulatory Visit | Attending: Family | Admitting: Family

## 2022-09-22 DIAGNOSIS — Z1231 Encounter for screening mammogram for malignant neoplasm of breast: Secondary | ICD-10-CM

## 2022-09-26 NOTE — Progress Notes (Signed)
Mammogram normal! Recheck in 1 year.

## 2022-09-27 NOTE — Progress Notes (Unsigned)
Phone [phone]  Subjective:   Patient is a 61 y.o. female presenting for annual physical.    No chief complaint on file.   See problem oriented charting- ROS- full  review of systems was completed and negative except for: ***noted in HPI above.  The following were reviewed and entered/updated in epic: Past Medical History:  Diagnosis Date  . Anxiety   . Depression   . PONV (postoperative nausea and vomiting)    Patient Active Problem List   Diagnosis Date Noted  . Bipolar 1 disorder, depressed, moderate ([place]) 06/28/2022  . Overdose 06/23/2022  . Acute respiratory failure ([place])   . Hypokalemia   . Acute encephalopathy   . Chronic alcoholism in remission ([place]) 02/22/2022  . Iron deficiency 02/22/2022  . Other specified behavioral and emotional disorders with onset usually occurring in childhood and adolescence 02/22/2022  . Primary insomnia 02/22/2022  . Raynaud's disease 02/22/2022  . Acute non-recurrent frontal sinusitis 12/22/2021  . Neoplasm of uncertain behavior of skin 11/01/2021  . Generalized anxiety disorder 10/27/2021  . Osteoarthritis of left knee 10/23/2021  . Osteoarthrosis of hand 10/23/2021  . Iron deficiency anemia 09/27/2021  . Mixed hyperlipidemia 09/27/2021  . History of alcohol abuse 09/27/2021  . Elevated serum hCG 09/27/2021  . Fatigue 09/27/2021  . Nasal congestion 08/16/2021  . Suicidal ideation   . Depression   . Alcohol abuse 01/21/2021  . MDD (major depressive disorder), recurrent severe, without psychosis ([place]) 01/21/2021  . Pain in joint of right shoulder 12/24/2019  . Deviated septum 06/30/2019  . Mixed conductive and sensorineural hearing loss of both ears 06/30/2019  . Nasal turbinate hypertrophy 06/30/2019  . Rhinitis, chronic 06/30/2019  . Tinnitus aurium, bilateral 06/30/2019  . Overweight (BMI 25.0-29.9) 05/14/2019  . S/P right TKA 05/13/2019  . Synovial cyst of knee 02/10/2019  . Pain in right knee 02/04/2019   Past  Surgical History:  Procedure Laterality Date  . AUGMENTATION MAMMAPLASTY Bilateral 1996  . BACK SURGERY    . BREAST BIOPSY Right   . CESAREAN SECTION    . FRACTURE SURGERY    . 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   Procedure: TOTAL KNEE ARTHROPLASTY;  Surgeon: Paralee Cancel, MD;  Location: [facility];  Service: Orthopedics;  Laterality: Right;  70 mins    Family History  Problem Relation Age of Onset  . Anxiety disorder Sister   . Depression Brother   . Schizophrenia Brother   . Suicidality Brother   . Anxiety disorder Brother   . Alcohol abuse Sister   . Alcohol abuse Brother   . Alcohol abuse Brother     Medications- reviewed and updated Current Outpatient Medications  Medication Sig Dispense Refill  . cetirizine (ZYRTEC) 10 MG tablet Take 10 mg by mouth daily.    Marland Kitchen estradiol-norethindrone (COMBIPATCH) 0.05-0.14 MG/DAY Place 1 patch onto the skin once a week.    . gabapentin (NEURONTIN) 100 MG capsule Take 1 capsule (100 mg total) by mouth 3 (three) times daily. 90 capsule 1  . lamoTRIgine (LAMICTAL) 100 MG tablet Take 1.5 tablets (150 mg total) by mouth daily. 135 tablet 0  . Multiple Vitamin (MULTIVITAMIN) tablet Take 1 tablet by mouth daily.    Marland Kitchen PARoxetine (PAXIL) 20 MG tablet Take 1 tablet (20 mg total) by mouth daily. 90 tablet 1  . traZODone (DESYREL) 50 MG tablet Take 1 tablet (50 mg total) by mouth at bedtime as needed for sleep. Take 1-2 tabs at bedtime as needed for insomnia 90 tablet  1   No current facility-administered medications for this visit.    Allergies-reviewed and updated Allergies  Allergen Reactions  . Latex Shortness Of Breath, Swelling and Rash    Social History   Social History Narrative  . Not on file    Objective:  There were no vitals taken for this visit. Physical Exam Vitals and nursing note reviewed.  Constitutional:      Appearance: Normal appearance.  HENT:     Head: Normocephalic.     Right Ear: Tympanic membrane normal.      Left Ear: Tympanic membrane normal.     Nose: Nose normal.     Mouth/Throat:     Mouth: Mucous membranes are moist.  Eyes:     Pupils: Pupils are equal, round, and reactive to light.  Cardiovascular:     Rate and Rhythm: Normal rate and regular rhythm.  Pulmonary:     Effort: Pulmonary effort is normal.     Breath sounds: Normal breath sounds.  Musculoskeletal:        General: Normal range of motion.     Cervical back: Normal range of motion.  Lymphadenopathy:     Cervical: No cervical adenopathy.  Skin:    General: Skin is warm and dry.  Neurological:     Mental Status: She is alert.  Psychiatric:        Mood and Affect: Mood normal.        Behavior: Behavior normal.     Assessment and Plan   Health Maintenance counseling: 1. Anticipatory guidance: Patient counseled regarding regular dental exams q6 months, eye exams,  avoiding smoking and second hand smoke, limiting alcohol to 1 beverage per day, no illicit drugs.   2. Risk factor reduction:  Advised patient of need for regular exercise and diet rich with fruits and vegetables to reduce risk of heart attack and stroke. Exercise- ***.  Wt Readings from Last 3 Encounters:  06/26/22 143 lb 14.4 oz (65.3 kg)  06/21/22 138 lb (62.6 kg)  04/04/22 136 lb (61.7 kg)   3. Immunizations/screenings/ancillary studies Immunization History  Administered Date(s) Administered  . Influenza Split 10/07/2018  . PFIZER(Purple Top)SARS-COV-2 Vaccination 12/01/2019, 12/19/2019, 09/07/2020, 10/19/2020, 03/13/2021, 09/16/2021  . Tdap 03/21/2016   Health Maintenance Due  Topic Date Due  . Zoster Vaccines- Shingrix (1 of 2) Never done  . COVID-19 Vaccine (7 - Pfizer risk series) 11/11/2021  . PAP SMEAR-Modifier  02/16/2022  . INFLUENZA VACCINE  06/20/2022    4. Cervical cancer screening- *** 5. Breast cancer screening-  mammogram *** 6. Colon cancer screening - *** 7. Skin cancer screening- advised regular sunscreen use. Denies  worrisome, changing, or new skin lesions.  8. Birth control/STD check- *** 9. Osteoporosis screening- *** 10. Alcohol screening: *** 11. Smoking associated screening (lung cancer screening, AAA screen 65-75, UA)- *** smoker- ***ppd  Problem List Items Addressed This Visit   None   Recommended follow up: ***No follow-ups on file. Future Appointments  Date Time Provider [place]  09/28/2022  8:20 AM Jeanie Sewer, NP [facility]  01/04/2023  1:30 PM Thayer Headings, PMHNP CP-CP None    Lab/Order associations:fasting   Jeanie Sewer, NP

## 2022-09-28 ENCOUNTER — Encounter: Payer: Self-pay | Admitting: Family

## 2022-09-28 ENCOUNTER — Ambulatory Visit (INDEPENDENT_AMBULATORY_CARE_PROVIDER_SITE_OTHER): Payer: Commercial Managed Care - HMO | Admitting: Family

## 2022-09-28 VITALS — BP 106/70 | HR 54 | Temp 98.6°F | Ht 64.0 in | Wt 135.4 lb

## 2022-09-28 DIAGNOSIS — Z79890 Hormone replacement therapy (postmenopausal): Secondary | ICD-10-CM | POA: Diagnosis not present

## 2022-09-28 DIAGNOSIS — M25562 Pain in left knee: Secondary | ICD-10-CM | POA: Diagnosis not present

## 2022-09-28 DIAGNOSIS — E2839 Other primary ovarian failure: Secondary | ICD-10-CM | POA: Diagnosis not present

## 2022-09-28 DIAGNOSIS — Z23 Encounter for immunization: Secondary | ICD-10-CM | POA: Diagnosis not present

## 2022-09-28 DIAGNOSIS — R21 Rash and other nonspecific skin eruption: Secondary | ICD-10-CM

## 2022-09-28 DIAGNOSIS — Z0000 Encounter for general adult medical examination without abnormal findings: Secondary | ICD-10-CM | POA: Diagnosis not present

## 2022-09-28 MED ORDER — PROGESTERONE MICRONIZED 100 MG PO CAPS: 100.0000 mg | ORAL_CAPSULE | Freq: Every day | ORAL | 1 refills | Status: AC

## 2022-09-28 MED ORDER — TRIAMCINOLONE ACETONIDE 0.1 % EX CREA: 1.0000 | TOPICAL_CREAM | Freq: Two times a day (BID) | CUTANEOUS | 0 refills | Status: DC

## 2022-09-28 MED ORDER — ESTRADIOL 0.5 MG PO TABS: 0.5000 mg | ORAL_TABLET | Freq: Every day | ORAL | 1 refills | Status: DC

## 2022-09-28 NOTE — Assessment & Plan Note (Signed)
chronic  pt taking combo patch, would like less expensive option sending Estradiol 0.'5mg'$  w/Progesterone '100mg'$  qhs, advised on use & SE f/u in 6 mos

## 2022-09-28 NOTE — Patient Instructions (Addendum)
It was very nice to see you today!    I have sent a referral for your bone density scan to the [facility] center.  Continue walking! Drink at least 2 Liters of water every day for good overall health, kidney protection, and helping bowels move daily. Can try taking Magnesium oxide, glycinate or L-threonate to help with bowels, stress, blood pressure & sleep maintenance, look for chelated brands (provides better absorption). Increase fiber in your food, can look for Benefiber or Metamucil to add to your coffee or drink to help with a soft BM daily or every other day.  I sent over a steroid cream to use on your itchy patch on back of head. Let me know if this is not helping.  I have sent over replacement meds for your hormone patch, Start these when time to change your patch.      PLEASE NOTE:  If you had any lab tests please let us know if you have not heard back within a few days. You may see your results on MyChart before we have a chance to review them but we will give you a call once they are reviewed by [place]. If we ordered any referrals today, please let us know if you have not heard from their office within the next week.

## 2022-10-05 ENCOUNTER — Ambulatory Visit: Payer: Commercial Managed Care - HMO | Admitting: Sports Medicine

## 2022-11-02 ENCOUNTER — Other Ambulatory Visit: Payer: Self-pay

## 2022-11-06 ENCOUNTER — Telehealth: Payer: Self-pay | Admitting: Family

## 2022-11-06 NOTE — Telephone Encounter (Signed)
Patient tested + for covid 12/16-   Patient Name: Debra Rhodes NER Gender: Female DOB: 12/17/1960 Age: 61 Y 27 D Return Phone Number: [phone] (Primary), [phone] (Secondary) Address: City/ State/ Zip: [place]  [address] Client [facility] Client Site [facility] [place] Contact Type Call Who Is Calling Patient / Member / Family / Caregiver Call Type Triage / Clinical Caller Name Alva Garnet Relationship To Patient Daughter Return Phone Number [phone] (Secondary) Chief Complaint Headache Reason for Call Symptomatic / Request for [name] states her mother is a pt. She has Covid and tested positive on Thursday. She is requesting the antiviral medication. She had it again earlier in April she was prescribed the medication. She has headache, chills, aches, and fatigue Additional Comment 2nd# is pts phone number Translation No Nurse Assessment Nurse: Vallery Sa, RN, T[place] Date/Time (Eastern Time): 11/04/2022 11:36:10 AM Confirm and document reason for call. If symptomatic, describe symptoms. ---Caller states that [facility] had a positive COVID test two days ago. No severe breathing difficulty or blueness around her lips. No chest pain. She has a moderate headache, low grade fever by touch, chills, and feels tired. Alert and responsive. Does the patient have any new or worsening symptoms? ---Yes Will a triage be completed? ---Yes Related visit to physician within the last 2 weeks? ---No Does the PT have any chronic conditions? (i.e. diabetes, asthma, this includes High risk factors for pregnancy, etc.) ---Yes List chronic conditions. ---Anxiety, Depression Is this a behavioral health or substance abuse call? ---No PLEASE NOTE: All timestamps contained within this report are represented as [place] Standard Time. CONFIDENTIALTY NOTICE: This fax transmission is  intended only for the addressee. It contains information that is legally privileged, confidential or otherwise protected from use or disclosure. If you are not the intended recipient, you are strictly prohibited from reviewing, disclosing, copying using or disseminating any of this information or taking any action in reliance on or regarding this information. If you have received this fax in error, please notify us immediately by telephone so that we can arrange for its return to [place]. Phone: [phone], Toll-Free: [phone], Fax: [phone] Page: 2 of 3 Call Id: [id number] Guidelines Guideline Title Affirmed Question Affirmed Notes Nurse Date/Time Eilene Ghazi Time) COVID-19 - Diagnosed or Suspected [1] COVID-19 diagnosed by positive lab test (e.g., PCR, rapid self-test kit) AND [2] mild symptoms (e.g., cough, fever, others) AND [7] no complications or SOB Trumbull, RN, Cathy 11/04/2022 11:42:52 AM Disp. Time Eilene Ghazi Time) Disposition Final User 11/04/2022 11:48:47 [place], RN, Tye [place] Final Disposition 11/04/2022 11:48:47 AM Home Care Yes Vallery Sa, RN, [place] Caller Disagree/Comply Comply Caller Understands Yes PreDisposition Call Doctor Care Advice Given Per Guideline HOME CARE: * You should be able to treat this at home. REASSURANCE AND EDUCATION - POSITIVE COVID-19 LAB TEST AND MILD SYMPTOMS: * You had a recent lab test for COVID-19 and it came back positive. * Treat fevers above 101 F (38.3 C). The goal of fever therapy is to bring the fever down to a comfortable level. Remember that fever medicine usually lowers fever 2 degrees F (1 - 1 1/2 degrees C). * ACETAMINOPHEN - REGULAR STRENGTH TYLENOL: Take 650 mg (two 325 mg pills) by mouth every 4 to 6 hours as needed. Each Regular Strength Tylenol pill has 325 mg of acetaminophen. The most you should take is 10 pills a day (3,250 mg total). Note: In [place], the maximum  is 12 pills a day (3,900 mg total). PAIN  AND FEVER MEDICINES: * For pain or fever relief, take either acetaminophen or ibuprofen. HUMIDIFIER: * If the air is dry, use a humidifier in the bedroom. COUGHING SPELLS: * Drink warm fluids. Inhale warm mist. This can help relax the airway and also loosen up phlegm. COVID-19 - HOW TO PROTECT OTHERS - WHEN YOU ARE SICK WITH COVID-19: * STAY HOME A MINIMUM OF 5 DAYS: People with MILD COVID-19 can STOP HOME ISOLATION AFTER 5 DAYS if (1) fever has been gone for 24 hours (without using fever medicine) AND (2) symptoms are better. Continue to wear a well-fitted mask for a full 10 days when around others. * WEAR A MASK FOR 10 DAYS: Wear a well-fitted mask for 10 full days any time you are around others inside your home or in public. Do not go to places where you are unable to wear a mask. * WASH HANDS OFTEN: Wash hands often with soap and water. After coughing or sneezing are important times. If soap and water are not available, use an alcohol-based hand sanitizer with at least 60% alcohol, covering all surfaces of your hands and rubbing them together until they feel dry. Avoid touching your eyes, nose, and mouth with unwashed hands. * CALL AHEAD IF MEDICAL CARE IS NEEDED: If you have a medical appointment, call your doctor's office and tell them you have or may have COVID-19. This will help the office protect themselves and other patients. They will give you directions. CALL BACK IF: * Fever over 103 F (39.4 C) * Fever lasts over 3 days * Chest pain or difficulty breathing occurs * You become worse CARE ADVICE given per COVID-19 - DIAGNOSED OR SUSPECTED (Adult) guideline. PLEASE NOTE: All timestamps contained within this report are represented as [place] Standard Time. CONFIDENTIALTY NOTICE: This fax transmission is intended only for the addressee. It contains information that is legally privileged, confidential or otherwise protected from use or disclosure. If you are not the intended recipient, you  are strictly prohibited from reviewing, disclosing, copying using or disseminating any of this information or taking any action in reliance on or regarding this information. If you have received this fax in error, please notify us immediately by telephone so that we can arrange for its return to us. Phone: [phone], Toll-Free: [phone], Fax: [phone] Page: 3 of 3 Call Id: [id number] Comments User: Cathy, Trumbull, RN Date/Time (Eastern Time): 11/04/2022 11:50:00 AM Edan may not go to urgent care and will then call the office Monday to see if Paxlovid can be called in then. Referrals GO TO FACILITY UNDECIDED 

## 2022-11-21 ENCOUNTER — Ambulatory Visit (INDEPENDENT_AMBULATORY_CARE_PROVIDER_SITE_OTHER): Payer: 59 | Admitting: Sports Medicine

## 2022-11-21 ENCOUNTER — Ambulatory Visit
Admission: RE | Admit: 2022-11-21 | Discharge: 2022-11-21 | Disposition: A | Discharge status: Home / Self Care | Payer: 59 | Source: Ambulatory Visit | Attending: Sports Medicine | Admitting: Sports Medicine

## 2022-11-21 ENCOUNTER — Other Ambulatory Visit: Payer: Self-pay | Admitting: Sports Medicine

## 2022-11-21 VITALS — BP 108/78 | Ht 63.0 in | Wt 130.0 lb

## 2022-11-21 DIAGNOSIS — G8929 Other chronic pain: Secondary | ICD-10-CM

## 2022-11-21 DIAGNOSIS — M25562 Pain in left knee: Secondary | ICD-10-CM

## 2022-11-21 DIAGNOSIS — M1712 Unilateral primary osteoarthritis, left knee: Secondary | ICD-10-CM

## 2022-11-21 DIAGNOSIS — M25462 Effusion, left knee: Secondary | ICD-10-CM | POA: Diagnosis not present

## 2022-11-21 MED ORDER — METHYLPREDNISOLONE ACETATE 40 MG/ML IJ SUSP
40.0000 mg | Freq: Once | INTRAMUSCULAR | Status: AC
  Administered 2022-11-21: 40 mg via INTRA_ARTICULAR

## 2022-11-22 NOTE — Progress Notes (Addendum)
   Subjective:    Patient ID: Debra Rhodes, female    DOB: [birth year], 62 y.o.   MRN: [id number]  HPI chief complaint: Left knee pain  Debra Rhodes is a very pleasant 62 year old female that presents today complaining of left knee pain and swelling.  She is status post right total knee arthroplasty done by Dr. Ihor Gully 3 years ago and she was told at that time that she had arthritis in the left knee as well.  She is not ready to proceed with surgery and is here to discuss conservative treatments.  Her pain is primarily along the medial knee.  It is worse at night.  She is very active and notices that activity such as the elliptical actually made her knee feel better.  She does endorse pain after prolonged sitting.  She takes over-the-counter ibuprofen which is somewhat helpful.  She also endorses nighttime pain.  No recent trauma.  No prior left knee surgeries.  Past medical history reviewed Medications reviewed Allergies reviewed    Review of Systems As above    Objective:   Physical Exam  Well-developed, well-nourished.  No acute distress  Left knee: Good range of motion.  There is a small effusion present.  She is tender to palpation along the medial joint line with pain but no popping with McMurray's.  No tenderness laterally.  Positive Thessaly's.  There is pseudolaxity of the MCL with valgus stressing.  Otherwise, ligaments are intact.  Neurovascularly intact distally.  Walking without a significant limp.  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  She does still have a small amount of joint space left on the AP.  Nothing acute is seen.      Assessment & Plan:   Left knee pain and swelling secondary to DJD  We discussed treatment options including physical therapy, compression, ice, oral anti-inflammatories, and cortisone injection.  Debra Rhodes would like to  proceed with a cortisone injection today.  Injection is performed as below and she tolerates this well.  I did recommend that she wear compression sleeve on the left knee when she is active and she will utilize icing as needed as well.  She may continue with over-the-counter ibuprofen as needed.  I think she can continue with her elliptical but I also recommended trying a stationary bike.  I think she should avoid any sort of exercise in the gym that places a lot of stress on the knee such as squats until I see her in follow-up in 4 weeks.  This note was dictated using Dragon naturally speaking software and may contain errors in syntax, spelling, or content which have not been identified prior to signing this note.   Consent obtained and verified. Time-out conducted. Noted no overlying erythema, induration, or other signs of local infection. Skin prepped in a sterile fashion. Topical analgesic spray: Ethyl chloride. Joint: Left knee Needle: 25-gauge 1.5 inch Completed without difficulty. Meds: 3 cc 1% Xylocaine, 1 cc (40 mg) Depo-Medrol  Advised to call if fevers/chills, erythema, induration, drainage, or persistent bleeding.

## 2022-11-30 ENCOUNTER — Ambulatory Visit: Payer: BC Managed Care – PPO | Admitting: Psychiatry

## 2022-12-01 ENCOUNTER — Emergency Department (HOSPITAL_BASED_OUTPATIENT_CLINIC_OR_DEPARTMENT_OTHER): Payer: 59

## 2022-12-01 ENCOUNTER — Emergency Department (HOSPITAL_BASED_OUTPATIENT_CLINIC_OR_DEPARTMENT_OTHER)
Admission: EM | Admit: 2022-12-01 | Discharge: 2022-12-01 | Disposition: A | Discharge status: Home / Self Care | Payer: 59 | Attending: Emergency Medicine | Admitting: Emergency Medicine

## 2022-12-01 ENCOUNTER — Encounter (HOSPITAL_BASED_OUTPATIENT_CLINIC_OR_DEPARTMENT_OTHER): Payer: Self-pay | Admitting: Radiology

## 2022-12-01 ENCOUNTER — Other Ambulatory Visit: Payer: Self-pay

## 2022-12-01 DIAGNOSIS — Z91040 Latex allergy status: Secondary | ICD-10-CM | POA: Diagnosis not present

## 2022-12-01 DIAGNOSIS — S61452A Open bite of left hand, initial encounter: Secondary | ICD-10-CM | POA: Diagnosis not present

## 2022-12-01 DIAGNOSIS — W540XXA Bitten by dog, initial encounter: Secondary | ICD-10-CM | POA: Insufficient documentation

## 2022-12-01 MED ORDER — AMOXICILLIN-POT CLAVULANATE 875-125 MG PO TABS: 1.0000 | ORAL_TABLET | Freq: Two times a day (BID) | ORAL | 0 refills | Status: DC

## 2022-12-01 MED ORDER — HYDROCODONE-ACETAMINOPHEN 5-325 MG PO TABS: 1.0000 | ORAL_TABLET | ORAL | 0 refills | Status: DC | PRN

## 2022-12-01 MED ORDER — AMOXICILLIN-POT CLAVULANATE 875-125 MG PO TABS
1.0000 | ORAL_TABLET | Freq: Once | ORAL | Status: AC
  Administered 2022-12-01: 1 via ORAL
  Filled 2022-12-01: qty 1

## 2022-12-01 MED ORDER — RABIES VACCINE, PCEC IM SUSR: 1.0000 mL | Freq: Once | INTRAMUSCULAR | Status: DC

## 2022-12-01 MED ORDER — IBUPROFEN 800 MG PO TABS
800.0000 mg | ORAL_TABLET | Freq: Once | ORAL | Status: AC
  Administered 2022-12-01: 800 mg via ORAL
  Filled 2022-12-01: qty 1

## 2022-12-01 MED ORDER — RABIES IMMUNE GLOBULIN 150 UNIT/ML IM INJ: 20.0000 [IU]/kg | INJECTION | Freq: Once | INTRAMUSCULAR | Status: DC

## 2022-12-01 NOTE — ED Triage Notes (Signed)
Pt states was bit by a dog that was vaccinate. Pt states it happened around 2pm today.

## 2022-12-01 NOTE — ED Notes (Signed)
Pt discharged home after verbalizing understanding of discharge instructions; nad noted. 

## 2022-12-01 NOTE — ED Triage Notes (Signed)
Pt has three small lacerations to her left hand.

## 2022-12-01 NOTE — ED Provider Notes (Signed)
[facility] EM[facility]ENC[facility] DEPT Provider Note   CSN: [id number] Arrival date & time: 12/01/22  1644     History  Chief Complaint  Patient presents with   Animal Bite    Debra Rhodes is a 62 y.o. female.  62 year old left-hand-dominant female presents with dog bite to left hand.  Patient states that she stopped to rescue 2 dogs today and the dog bit her on the left hand resulting in a small wound to the dorsum and palmar aspects of the left hand.  Injury happened around 2 PM today patient was able to contact the dog's owners and verified their vaccines were up-to-date.  Patient's last tetanus was less than 5 years ago.  Presents with concern for pain and swelling in the hand, specifically worse with movement of the wrist and fingers.       Home Medications Prior to Admission medications   Medication Sig Start Date End Date Taking? Authorizing Provider  amoxicillin-clavulanate (AUGMENTIN) 875-125 MG tablet Take 1 tablet by mouth every 12 (twelve) hours. 12/01/22  Yes Tacy Learn, PA-C  HYDROcodone-acetaminophen (NORCO/VICODIN) 5-325 MG tablet Take 1 tablet by mouth every 4 (four) hours as needed. 12/01/22  Yes Tacy Learn, PA-C  cetirizine (ZYRTEC) 10 MG tablet Take 10 mg by mouth daily.    [provider]  estradiol (ESTRACE) 0.5 MG tablet Take 1 tablet (0.5 mg total) by mouth daily. 09/28/22   Jeanie Sewer, NP  gabapentin (NEURONTIN) 100 MG capsule Take 1 capsule (100 mg total) by mouth 3 (three) times daily. 07/31/22 08/30/22  Thayer Headings, PMHNP  lamoTRIgine (LAMICTAL) 100 MG tablet Take 1.5 tablets (150 mg total) by mouth daily. 09/11/22 12/10/22  Thayer Headings, PMHNP  Multiple Vitamin (MULTIVITAMIN) tablet Take 1 tablet by mouth daily.    [provider]  PARoxetine (PAXIL) 20 MG tablet Take 1 tablet (20 mg total) by mouth daily. 09/11/22   Thayer Headings, PMHNP  progesterone (PROMETRIUM) 100 MG capsule Take 1 capsule (100 mg total) by  mouth at bedtime. 09/28/22   Jeanie Sewer, NP  traZODone (DESYREL) 50 MG tablet Take 1 tablet (50 mg total) by mouth at bedtime as needed for sleep. Take 1-2 tabs at bedtime as needed for insomnia 09/11/22   Thayer Headings, PMHNP  triamcinolone cream (KENALOG) 0.1 % Apply 1 Application topically 2 (two) times daily. 09/28/22   Jeanie Sewer, NP  sertraline (ZOLOFT) 100 MG tablet Decrease to 1.5 tabs daily for 5 days, then 1 tab daily for 5 days, then 1/2 tablet daily for 5 days, then stop 11/23/20 12/23/20  Thayer Headings, PMHNP      Allergies    Latex    Review of Systems   Review of Systems Negative except as per HPI Physical Exam Updated Vital Signs BP 116/73 (BP Location: Right Arm)   Pulse 76   Temp 97.7 F (36.5 C)   Resp 16   Ht '5\' 3"'$  (1.6 m)   Wt 59 kg   SpO2 100%   BMI 23.03 kg/m  Physical Exam Vitals and nursing note reviewed.  Constitutional:      General: She is not in acute distress.    Appearance: She is well-developed. She is not diaphoretic.  HENT:     Head: Normocephalic and atraumatic.  Cardiovascular:     Pulses: Normal pulses.  Pulmonary:     Effort: Pulmonary effort is normal.  Musculoskeletal:        General: Swelling and tenderness present.  Left hand: Swelling and tenderness present. Decreased range of motion. Normal capillary refill. Normal pulse.     Comments: Small puncture to palmar and dorsal aspect of left hand with surrounding swelling and tenderness, no erythema  Skin:    General: Skin is warm and dry.     Findings: No erythema or rash.  Neurological:     Mental Status: She is alert and oriented to person, place, and time.     Sensory: No sensory deficit.  Psychiatric:        Behavior: Behavior normal.     ED Results / Procedures / Treatments   Labs (all labs ordered are listed, but only abnormal results are displayed) Labs Reviewed - No data to display  EKG None  Radiology DG Hand Complete Left  Result Date:  12/01/2022 CLINICAL DATA:  Dog bite.  Pain EXAM: LEFT HAND - COMPLETE 3 VIEW COMPARISON:  None Available. FINDINGS: No acute fracture or dislocation. Preserved bone mineralization. Multifocal degenerative changes are identified. This includes mildly along the distal interphalangeal joints of the second and third digit, proximal interphalangeal joints of fifth digit and of the thumb. There is also more moderate degenerative changes of the first carpometacarpal joint with joint space loss, sclerosis and hypertrophic change. No erosive changes. No radiopaque foreign body. IMPRESSION: Multifocal degenerative changes greatest of the first carpometacarpal joint. Electronically Signed   By: Jill Side M.D.   On: 12/01/2022 17:36    Procedures Procedures    Medications Ordered in ED Medications  amoxicillin-clavulanate (AUGMENTIN) 875-125 MG per tablet 1 tablet (has no administration in time range)  ibuprofen (ADVIL) tablet 800 mg (has no administration in time range)    ED Course/ Medical Decision Making/ A&P                             Medical Decision Making Amount and/or Complexity of Data Reviewed Radiology: ordered.  Risk Prescription drug management.   62 year old female with dog bite to the left/dominant hand today.  Appears to have local swelling with small puncture wounds, pain worse with range of motion.  Tetanus is up-to-date.  Will start on Augmentin, obtain x-ray to evaluate for foreign body/bony injury and splint for comfort.  Referral to animal control to verify vaccine status of the dog.  X-ray of the left hand is ordered and interpreted by myself as negative for acute bony injury or retained foreign body.  Agree with radiologist interpretation.  Splint for comfort.  Recommend ice and elevate.  Provided with first dose of Augmentin tonight, prescription for Augmentin and short course of Norco sent to patient's pharmacy.  Referred to hand Ortho for follow-up on Monday with  return to ER precautions for any concerns for infection.        Final Clinical Impression(s) / ED Diagnoses Final diagnoses:  Dog bite, hand, left, initial encounter    Rx / DC Orders ED Discharge Orders          Ordered    amoxicillin-clavulanate (AUGMENTIN) 875-125 MG tablet  Every 12 hours        12/01/22 1726    HYDROcodone-acetaminophen (NORCO/VICODIN) 5-325 MG tablet  Every 4 hours PRN        12/01/22 1726              Tacy Learn, PA-C 12/01/22 1742    Wyvonnia Dusky, MD 12/01/22 2000

## 2022-12-01 NOTE — Discharge Instructions (Addendum)
Clean wound and apply Bacitracin. Take Augmentin as prescribed. Follow up with orthopedics- hand specialist for recheck. Return to ER at anytime for infection concerns. Apply ice for 20 minutes at a time to help with swelling. Can also elevate to help with swelling. Wear splint for comfort.   Your X-ray shows degenerative changes without any broken bones or retained foreign bodies.

## 2022-12-19 ENCOUNTER — Ambulatory Visit: Payer: 59 | Admitting: Sports Medicine

## 2022-12-26 ENCOUNTER — Ambulatory Visit: Payer: 59 | Admitting: Sports Medicine

## 2022-12-26 VITALS — BP 118/70 | Ht 63.0 in | Wt 130.0 lb

## 2022-12-26 DIAGNOSIS — M1712 Unilateral primary osteoarthritis, left knee: Secondary | ICD-10-CM | POA: Diagnosis not present

## 2022-12-26 NOTE — Patient Instructions (Addendum)
Please call our [employer] rep to discuss getting fitted for a medial unloader knee brace Linton Flemings  [phone]  We are going to refer you to Dr. Zachery Dakins to discuss options for your left knee pain. [place] [phone]  Appt: 12/28/22 @ 8:15 am. Please arrive at 8 am for check in.

## 2022-12-27 NOTE — Progress Notes (Signed)
Patient ID: Debra Rhodes, female   DOB: 1961-09-18, 62 y.o.   MRN: [id number]  Debra Rhodes presents today for follow-up on left knee pain secondary to advanced medial compartmental DJD.  Cortisone injection administered at the last visit was helpful.  It resolved her lateral knee pain but she began to experience worsening medial knee pain after undergoing massage.  That pain persists.  Physical exam was not repeated today.  We simply talked about treatment going forward.  Since the vast majority of her osteoarthritis is in the medial compartment, I wonder if she is a candidate for a unicompartmental replacement.  I have recommended a referral to Dr. Zachery Dakins to discuss this further.  Alternatively, she is a candidate for viscosupplementation.  I recommended that she try some topical Voltaren gel and I also think she would benefit from a medial unloader brace (although physical exam was not repeated today, previous exam did demonstrate pseudolaxity of the medial collateral ligament).  Patient agrees with this plan.  This note was dictated using Dragon naturally speaking software and may contain errors in syntax, spelling, or content which have not been identified prior to signing this note.

## 2022-12-28 DIAGNOSIS — M25562 Pain in left knee: Secondary | ICD-10-CM | POA: Diagnosis not present

## 2023-01-04 ENCOUNTER — Ambulatory Visit: Payer: 59 | Admitting: Family

## 2023-01-04 ENCOUNTER — Ambulatory Visit (INDEPENDENT_AMBULATORY_CARE_PROVIDER_SITE_OTHER): Payer: Self-pay | Admitting: Psychiatry

## 2023-01-05 ENCOUNTER — Encounter: Payer: Self-pay | Admitting: Family

## 2023-01-05 ENCOUNTER — Ambulatory Visit (INDEPENDENT_AMBULATORY_CARE_PROVIDER_SITE_OTHER): Payer: 59 | Admitting: Family

## 2023-01-05 VITALS — BP 107/65 | HR 75 | Temp 97.7°F | Ht 63.0 in | Wt 135.0 lb

## 2023-01-05 DIAGNOSIS — M1712 Unilateral primary osteoarthritis, left knee: Secondary | ICD-10-CM | POA: Diagnosis not present

## 2023-01-05 DIAGNOSIS — N3090 Cystitis, unspecified without hematuria: Secondary | ICD-10-CM | POA: Diagnosis not present

## 2023-01-05 LAB — POCT URINALYSIS DIPSTICK
Bilirubin, UA: NEGATIVE
Glucose, UA: NEGATIVE
Ketones, UA: NEGATIVE
Nitrite, UA: POSITIVE — AB
Protein, UA: NEGATIVE
Spec Grav, UA: 1.02 (ref 1.010–1.025)
Urobilinogen, UA: 1 E.U./dL
pH, UA: 6 (ref 5.0–8.0)

## 2023-01-05 MED ORDER — SULFAMETHOXAZOLE-TRIMETHOPRIM 800-160 MG PO TABS: 1.0000 | ORAL_TABLET | Freq: Two times a day (BID) | ORAL | 0 refills | Status: AC

## 2023-01-05 NOTE — Progress Notes (Signed)
Patient ID: Debra Rhodes, female    DOB: 07/23/1961, 62 y.o.   MRN: [id number]  Chief Complaint  Patient presents with   Urinary Frequency    Pt c/o urinary frequency, dysuria and urgency, present for 2 days. Has tried AZO which did help.     HPI:  UTI sx:   Pt c/o urinary frequency, dysuria and urgency, present for 2 days. Denies any nausea, low back or pelvic pain, no fever. Last UTI about 5 yrs ago.  Has tried AZO which did help.      Assessment & Plan:  1. Cystitis sending Bactrim, advised on use & SE, increase water intake to 2L per day. Sending out for culture, advised we will notify her of results.  - POCT Urinalysis Dipstick - sulfamethoxazole-trimethoprim (BACTRIM DS) 800-160 MG tablet; Take 1 tablet by mouth 2 (two) times daily after a meal.  Dispense: 6 tablet; Refill: 0 - Urine Culture   Subjective:    Outpatient Medications Prior to Visit  Medication Sig Dispense Refill   cetirizine (ZYRTEC) 10 MG tablet Take 10 mg by mouth daily.     estradiol (ESTRACE) 0.5 MG tablet Take 1 tablet (0.5 mg total) by mouth daily. 90 tablet 1   Multiple Vitamin (MULTIVITAMIN) tablet Take 1 tablet by mouth daily.     PARoxetine (PAXIL) 20 MG tablet Take 1 tablet (20 mg total) by mouth daily. 90 tablet 1   progesterone (PROMETRIUM) 100 MG capsule Take 1 capsule (100 mg total) by mouth at bedtime. 90 capsule 1   traZODone (DESYREL) 50 MG tablet Take 1 tablet (50 mg total) by mouth at bedtime as needed for sleep. Take 1-2 tabs at bedtime as needed for insomnia 90 tablet 1   triamcinolone cream (KENALOG) 0.1 % Apply 1 Application topically 2 (two) times daily. 30 g 0   amoxicillin-clavulanate (AUGMENTIN) 875-125 MG tablet Take 1 tablet by mouth every 12 (twelve) hours. 14 tablet 0   HYDROcodone-acetaminophen (NORCO/VICODIN) 5-325 MG tablet Take 1 tablet by mouth every 4 (four) hours as needed. 6 tablet 0   gabapentin (NEURONTIN) 100 MG capsule Take 1 capsule (100 mg total) by mouth 3  (three) times daily. 90 capsule 1   lamoTRIgine (LAMICTAL) 100 MG tablet Take 1.5 tablets (150 mg total) by mouth daily. 135 tablet 0   No facility-administered medications prior to visit.   Past Medical History:  Diagnosis Date   Alcohol abuse 01/21/2021   Anxiety    Depression    Elevated serum hCG 09/27/2021   Fatigue 09/27/2021   History of alcohol abuse 09/27/2021   Hypokalemia    Iron deficiency 02/22/2022   Overweight (BMI 25.0-29.9) 05/14/2019   Pain in joint of right shoulder 12/24/2019   Pain in right knee 02/04/2019   PONV (postoperative nausea and vomiting)    Past Surgical History:  Procedure Laterality Date   AUGMENTATION MAMMAPLASTY Bilateral 1996   BACK SURGERY     BREAST BIOPSY Right    CESAREAN SECTION     FRACTURE SURGERY     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   Procedure: TOTAL KNEE ARTHROPLASTY;  Surgeon: Paralee Cancel, MD;  Location: [facility];  Service: Orthopedics;  Laterality: Right;  70 mins   Allergies  Allergen Reactions   Latex Shortness Of Breath, Swelling and Rash      Objective:    Physical Exam Vitals and nursing note reviewed.  Constitutional:      Appearance: Normal appearance.  Cardiovascular:  Rate and Rhythm: Normal rate and regular rhythm.  Pulmonary:     Effort: Pulmonary effort is normal.     Breath sounds: Normal breath sounds.  Musculoskeletal:        General: Normal range of motion.  Skin:    General: Skin is warm and dry.  Neurological:     Mental Status: She is alert.  Psychiatric:        Mood and Affect: Mood normal.        Behavior: Behavior normal.    Temp 97.7 F (36.5 C) (Temporal)   Ht 5' 3"$  (1.6 m)   Wt 135 lb (61.2 kg)   BMI 23.91 kg/m  Wt Readings from Last 3 Encounters:  01/05/23 135 lb (61.2 kg)  12/26/22 130 lb (59 kg)  12/01/22 130 lb (59 kg)       Jeanie Sewer, NP

## 2023-01-09 ENCOUNTER — Ambulatory Visit (INDEPENDENT_AMBULATORY_CARE_PROVIDER_SITE_OTHER): Payer: 59 | Admitting: Psychiatry

## 2023-01-09 ENCOUNTER — Encounter: Payer: Self-pay | Admitting: Psychiatry

## 2023-01-09 VITALS — Wt 135.0 lb

## 2023-01-09 DIAGNOSIS — G4700 Insomnia, unspecified: Secondary | ICD-10-CM | POA: Diagnosis not present

## 2023-01-09 DIAGNOSIS — F411 Generalized anxiety disorder: Secondary | ICD-10-CM

## 2023-01-09 DIAGNOSIS — F32A Depression, unspecified: Secondary | ICD-10-CM | POA: Diagnosis not present

## 2023-01-09 MED ORDER — PAROXETINE HCL 20 MG PO TABS: 20.0000 mg | ORAL_TABLET | Freq: Every day | ORAL | 1 refills | Status: AC

## 2023-01-09 MED ORDER — GABAPENTIN 100 MG PO CAPS: 100.0000 mg | ORAL_CAPSULE | Freq: Three times a day (TID) | ORAL | 1 refills | Status: AC | PRN

## 2023-01-09 MED ORDER — TRAZODONE HCL 50 MG PO TABS: ORAL_TABLET | ORAL | 1 refills | Status: AC

## 2023-01-09 MED ORDER — LAMOTRIGINE 100 MG PO TABS: 100.0000 mg | ORAL_TABLET | Freq: Every day | ORAL | 1 refills | Status: DC

## 2023-01-09 NOTE — Progress Notes (Signed)
JENAVI SCHLOTFELDT [id number] [birth year] 62 y.o.  Subjective:   Patient ID:  Debra Rhodes is a 62 y.o. (DOB 02/21/1961) female.  Chief Complaint:  Chief Complaint  Patient presents with   Follow-up    Mood disturbance, anxiety, insomnia, ADHD    HPI Debra Rhodes presents to the office today for follow-up of anxiety, mood disturbance, and insomnia. She reports that Lamictal has been helpful. She has been taking 100 mg at night and "this is the time of year where I usually feel depressed and icky." She had significant tiredness at 150 mg qd. Denies persistent depressed mood. She reports that she has maybe had less patience with others than usual. Denies impulsive or risky behavior. She reports excessive spending has improved. She reports that her anxiety "is so much better." She reports having some social anxiety and started using Gabapentin as needed for social anixety. She notices increased social anxiety when she is tired. Anxiety with giving a recent presentation. Denies panic attacks. Energy and motivation have been ok. She reports adequate sleep with Trazodone. Appetite has been good. Concentration is consistent with baseline. 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 Denies SI.   She reports that her work has been going well.She reports that going into the office regularly is helpful for her.   Sober for 4 months. She reports continued alcohol cravings. Continues to go to meetings and has a sponsor.  Denies any major psychosocial stressors or changes. Anticipates daughter getting engaged.   Past Psychiatric Medication Trials: Paxil- Has taken it since 62 yo. Has had times where it has been less effective. Had wt gain and sexual side effects at higher doses (40 mg and 60 mg). Prozac- felt more anxious Sertraline Cymbalta- Started on 40 mg and ws increased to 60 mg. Wellbutrin- increased irritability Lamictal- Helpful for depression. Tiredness  at 150 mg qd.  Trazodone-helpful but causes some excessive somnolence, especially at 100 mg dose.  Remeron- Effective, wt gain Hydroxyzine Gabapentin- Takes as needed for anxiety and leg cramps. Occ word finding errors with more than 300 mg Buspar-Increased agitation and anxiety Concerta- Had increased anxiety Strattera- Effective but feels "wired" [place] Office Visit from 07/31/2022 in [place] Office Visit from 02/17/2022 in [place] Total Score 0 1      PHQ2-9    [place] Visit from 02/22/2022 in [place] ED from 10/26/2021 in [facility] Emergency Department at [facility] Total Score 0 4  PHQ-9 Total Score 0 13      [place] ED from 12/01/2022 in [facility] Emergency Department at [facility] ED to Hosp-Admission (Discharged) from 06/23/2022 in [place] ED from 06/21/2022 in [facility] Emergency Department at [place] No Risk Error: Q2 is Yes, you must answer 3, 4, and 5 High Risk        Review of Systems:  Review of Systems  Musculoskeletal:  Negative for gait problem.  Skin:  Negative for rash.  Neurological:  Negative for tremors.  Psychiatric/Behavioral:         Please refer to HPI    Medications: I have reviewed the patient's current medications.  Current Outpatient Medications  Medication Sig Dispense Refill   cetirizine (ZYRTEC) 10 MG tablet Take 10 mg by mouth daily.  estradiol (ESTRACE) 0.5 MG tablet Take 1 tablet (0.5 mg total) by mouth daily. 90 tablet 1   Multiple Vitamin (MULTIVITAMIN) tablet Take 1 tablet by mouth daily.     progesterone (PROMETRIUM) 100 MG capsule Take 1 capsule (100 mg total) by mouth at bedtime. 90 capsule 1   gabapentin (NEURONTIN) 100 MG capsule Take 1 capsule (100 mg total) by mouth 3 (three) times daily  as needed. 180 capsule 1   lamoTRIgine (LAMICTAL) 100 MG tablet Take 1 tablet (100 mg total) by mouth daily. 90 tablet 1   PARoxetine (PAXIL) 20 MG tablet Take 1 tablet (20 mg total) by mouth daily. 90 tablet 1   sulfamethoxazole-trimethoprim (BACTRIM DS) 800-160 MG tablet Take 1 tablet by mouth 2 (two) times daily after a meal. (Patient not taking: Reported on 01/09/2023) 6 tablet 0   traZODone (DESYREL) 50 MG tablet Take 1-2 tabs at bedtime as needed for insomnia 180 tablet 1   No current facility-administered medications for this visit.    Medication Side Effects: None  Allergies:  Allergies  Allergen Reactions   Latex Shortness Of Breath, Swelling and Rash    Past Medical History:  Diagnosis Date   Alcohol abuse 01/21/2021   Anxiety    Depression    Elevated serum hCG 09/27/2021   Fatigue 09/27/2021   History of alcohol abuse 09/27/2021   Hypokalemia    Iron deficiency 02/22/2022   Overweight (BMI 25.0-29.9) 05/14/2019   Pain in joint of right shoulder 12/24/2019   Pain in right knee 02/04/2019   PONV (postoperative nausea and vomiting)     Past Medical History, Surgical history, Social history, and Family history were reviewed and updated as appropriate.   Please see review of systems for further details on the patient's review from today.   Objective:   Physical Exam:  There were no vitals taken for this visit.  Physical Exam Constitutional:      General: She is not in acute distress. Musculoskeletal:        General: No deformity.  Neurological:     Mental Status: She is alert and oriented to person, place, and time.     Coordination: Coordination normal.  Psychiatric:        Attention and Perception: Attention and perception normal. She does not perceive auditory or visual hallucinations.        Mood and Affect: Mood normal. Mood is not anxious or depressed. Affect is not labile, blunt, angry or inappropriate.        Speech: Speech normal.        Behavior:  Behavior normal.        Thought Content: Thought content normal. Thought content is not paranoid or delusional. Thought content does not include homicidal or suicidal ideation. Thought content does not include homicidal or suicidal plan.        Cognition and Memory: Cognition and memory normal.        Judgment: Judgment normal.     Comments: Insight intact     Lab Review:     Component Value Date/Time   NA 135 07/06/2022 0643   K 4.4 07/06/2022 0643   CL 101 07/06/2022 0643   CO2 26 07/06/2022 0643   GLUCOSE 113 (H) 07/06/2022 0643   BUN 13 07/06/2022 0643   CREATININE 0.76 07/06/2022 0643   CALCIUM 9.8 07/06/2022 0643   PROT 6.6 07/04/2022 0645   ALBUMIN 3.8 07/04/2022 0645   AST 22 07/04/2022 0645   ALT 34 07/04/2022 0645  ALKPHOS 39 07/04/2022 0645   BILITOT 0.9 07/04/2022 0645   GFRNONAA >60 07/06/2022 0643   GFRAA >60 05/14/2019 0317       Component Value Date/Time   WBC 4.2 06/26/2022 1030   RBC 3.69 (L) 06/26/2022 1030   HGB 11.9 (L) 06/26/2022 1030   HCT 35.5 (L) 06/26/2022 1030   PLT 165 06/26/2022 1030   MCV 96.2 06/26/2022 1030   MCH 32.2 06/26/2022 1030   MCHC 33.5 06/26/2022 1030   RDW 12.3 06/26/2022 1030   LYMPHSABS 1.2 06/25/2022 0221   MONOABS 0.6 06/25/2022 0221   EOSABS 0.2 06/25/2022 0221   BASOSABS 0.0 06/25/2022 0221    No results found for: "POCLITH", "LITHIUM"   No results found for: "PHENYTOIN", "PHENOBARB", "VALPROATE", "CBMZ"   .res Assessment: Plan:    Will change Lamictal to 100 mg po qd since patient reports that she felt excessively tired on the 150 mg dose and 100 mg daily has been effective for her mood and anxiety.  Will also re-start Gabapentin 100 mg 1-3 capsules daily as needed for anxiety since pt reports that this is helpful for her anxiety in situations like giving a presentation.  Will continue Paxil 20 mg po qd for anxiety and depression.  Continue Trazodone 50 mg 1-2 tabs po QHS prn insomnia.  Pt to follow-up in 6  months or sooner if clinically indicated.  Discussed resuming therapy and pt reports that she now has insurance, however copays are higher and she would like to hold off on therapy at this time and will consider resuming therapy in the future.  Patient advised to contact office with any questions, adverse effects, or acute worsening in signs and symptoms.   Mauda was seen today for follow-up.  Diagnoses and all orders for this visit:  Generalized anxiety disorder -     gabapentin (NEURONTIN) 100 MG capsule; Take 1 capsule (100 mg total) by mouth 3 (three) times daily as needed.  Insomnia, unspecified type -     traZODone (DESYREL) 50 MG tablet; Take 1-2 tabs at bedtime as needed for insomnia  Depression, unspecified depression type -     PARoxetine (PAXIL) 20 MG tablet; Take 1 tablet (20 mg total) by mouth daily. -     lamoTRIgine (LAMICTAL) 100 MG tablet; Take 1 tablet (100 mg total) by mouth daily.     Please see After Visit Summary for patient specific instructions.  Future Appointments  Date Time Provider [place]  03/27/2023 12:00 PM [facility] DX DEXA 1 [facility] GI-[facility]    No orders of the defined types were placed in this encounter.   -------------------------------

## 2023-01-19 ENCOUNTER — Telehealth: Payer: Self-pay | Admitting: Psychiatry

## 2023-01-19 NOTE — Telephone Encounter (Signed)
Patient lvm at 1:51 stating that she can't stop crying. " Its not a big deal just thought to call." If provider on call could rtc to her. Ph [phone]

## 2023-01-23 ENCOUNTER — Emergency Department (HOSPITAL_COMMUNITY)
Admission: EM | Admit: 2023-01-23 | Discharge: 2023-01-24 | Disposition: A | Discharge status: Nursing Home (Includes ICF) | Payer: 59 | Attending: Emergency Medicine | Admitting: Emergency Medicine

## 2023-01-23 ENCOUNTER — Encounter (HOSPITAL_COMMUNITY): Payer: Self-pay

## 2023-01-23 ENCOUNTER — Other Ambulatory Visit: Payer: Self-pay

## 2023-01-23 DIAGNOSIS — F10920 Alcohol use, unspecified with intoxication, uncomplicated: Secondary | ICD-10-CM

## 2023-01-23 DIAGNOSIS — F10129 Alcohol abuse with intoxication, unspecified: Secondary | ICD-10-CM | POA: Diagnosis present

## 2023-01-23 DIAGNOSIS — F10120 Alcohol abuse with intoxication, uncomplicated: Secondary | ICD-10-CM | POA: Diagnosis not present

## 2023-01-23 DIAGNOSIS — Z96651 Presence of right artificial knee joint: Secondary | ICD-10-CM | POA: Diagnosis not present

## 2023-01-23 DIAGNOSIS — Z91040 Latex allergy status: Secondary | ICD-10-CM | POA: Insufficient documentation

## 2023-01-23 DIAGNOSIS — F10929 Alcohol use, unspecified with intoxication, unspecified: Secondary | ICD-10-CM | POA: Diagnosis not present

## 2023-01-23 DIAGNOSIS — R45851 Suicidal ideations: Secondary | ICD-10-CM | POA: Diagnosis not present

## 2023-01-23 DIAGNOSIS — F1010 Alcohol abuse, uncomplicated: Secondary | ICD-10-CM | POA: Diagnosis not present

## 2023-01-23 DIAGNOSIS — Z20822 Contact with and (suspected) exposure to covid-19: Secondary | ICD-10-CM | POA: Insufficient documentation

## 2023-01-23 DIAGNOSIS — R9431 Abnormal electrocardiogram [ECG] [EKG]: Secondary | ICD-10-CM | POA: Diagnosis not present

## 2023-01-23 DIAGNOSIS — F332 Major depressive disorder, recurrent severe without psychotic features: Secondary | ICD-10-CM | POA: Diagnosis not present

## 2023-01-23 DIAGNOSIS — Y908 Blood alcohol level of 240 mg/100 ml or more: Secondary | ICD-10-CM | POA: Diagnosis not present

## 2023-01-23 DIAGNOSIS — Z743 Need for continuous supervision: Secondary | ICD-10-CM | POA: Diagnosis not present

## 2023-01-23 DIAGNOSIS — R457 State of emotional shock and stress, unspecified: Secondary | ICD-10-CM | POA: Diagnosis not present

## 2023-01-23 LAB — ACETAMINOPHEN LEVEL: Acetaminophen (Tylenol), Serum: <10 ug/mL — ABNORMAL LOW (ref 10–30)

## 2023-01-23 LAB — CBC WITH DIFFERENTIAL/PLATELET
Abs Immature Granulocytes: 0.01 10*3/uL (ref 0.00–0.07)
Basophils Absolute: 0.1 10*3/uL (ref 0.0–0.1)
Basophils Relative: 1 %
Eosinophils Absolute: 0.1 10*3/uL (ref 0.0–0.5)
Eosinophils Relative: 2 %
HCT: 39.5 % (ref 36.0–46.0)
Hemoglobin: 13.3 g/dL (ref 12.0–15.0)
Immature Granulocytes: 0 %
Lymphocytes Relative: 48 %
Lymphs Abs: 2 10*3/uL (ref 0.7–4.0)
MCH: 32.4 pg (ref 26.0–34.0)
MCHC: 33.7 g/dL (ref 30.0–36.0)
MCV: 96.3 fL (ref 80.0–100.0)
Monocytes Absolute: 0.3 10*3/uL (ref 0.1–1.0)
Monocytes Relative: 8 %
Neutro Abs: 1.8 10*3/uL (ref 1.7–7.7)
Neutrophils Relative %: 41 %
Platelets: 262 10*3/uL (ref 150–400)
RBC: 4.1 MIL/uL (ref 3.87–5.11)
RDW: 13.9 % (ref 11.5–15.5)
WBC: 4.2 10*3/uL (ref 4.0–10.5)
nRBC: 0 % (ref 0.0–0.2)

## 2023-01-23 LAB — RAPID URINE DRUG SCREEN, HOSP PERFORMED
Amphetamines: NOT DETECTED
Barbiturates: NOT DETECTED
Benzodiazepines: NOT DETECTED
Cocaine: NOT DETECTED
Opiates: NOT DETECTED
Tetrahydrocannabinol: NOT DETECTED

## 2023-01-23 LAB — COMPREHENSIVE METABOLIC PANEL
ALT: 13 U/L (ref 0–44)
AST: 23 U/L (ref 15–41)
Albumin: 4.1 g/dL (ref 3.5–5.0)
Alkaline Phosphatase: 39 U/L (ref 38–126)
Anion gap: 10 (ref 5–15)
BUN: 6 mg/dL — ABNORMAL LOW (ref 8–23)
CO2: 24 mmol/L (ref 22–32)
Calcium: 8.4 mg/dL — ABNORMAL LOW (ref 8.9–10.3)
Chloride: 104 mmol/L (ref 98–111)
Creatinine, Ser: 0.7 mg/dL (ref 0.44–1.00)
GFR, Estimated: >60 mL/min (ref >60)
Glucose, Bld: 99 mg/dL (ref 70–99)
Potassium: 3.8 mmol/L (ref 3.5–5.1)
Sodium: 138 mmol/L (ref 135–145)
Total Bilirubin: 0.5 mg/dL (ref 0.3–1.2)
Total Protein: 7 g/dL (ref 6.5–8.1)

## 2023-01-23 LAB — RESP PANEL BY RT-PCR (RSV, FLU A&B, COVID)  RVPGX2
Influenza A by PCR: NEGATIVE
Influenza B by PCR: NEGATIVE
Resp Syncytial Virus by PCR: NEGATIVE
SARS Coronavirus 2 by RT PCR: NEGATIVE

## 2023-01-23 LAB — SALICYLATE LEVEL: Salicylate Lvl: <7 mg/dL — ABNORMAL LOW (ref 7.0–30.0)

## 2023-01-23 LAB — ETHANOL: Alcohol, Ethyl (B): 375 mg/dL (ref <10)

## 2023-01-23 MED ORDER — ADULT MULTIVITAMIN W/MINERALS CH
1.0000 | ORAL_TABLET | Freq: Every day | ORAL | Status: DC
  Administered 2023-01-23 – 2023-01-24 (×2): 1 via ORAL
  Filled 2023-01-23 (×2): qty 1

## 2023-01-23 MED ORDER — FOLIC ACID 1 MG PO TABS
1.0000 mg | ORAL_TABLET | Freq: Every day | ORAL | Status: DC
  Administered 2023-01-23 – 2023-01-24 (×2): 1 mg via ORAL
  Filled 2023-01-23 (×2): qty 1

## 2023-01-23 MED ORDER — THIAMINE HCL 100 MG/ML IJ SOLN
100.0000 mg | Freq: Every day | INTRAMUSCULAR | Status: DC
  Filled 2023-01-23: qty 2

## 2023-01-23 MED ORDER — LORAZEPAM 1 MG PO TABS
1.0000 mg | ORAL_TABLET | ORAL | Status: DC | PRN
  Administered 2023-01-23: 1 mg via ORAL
  Administered 2023-01-24: 2 mg via ORAL
  Filled 2023-01-23: qty 2
  Filled 2023-01-23: qty 1

## 2023-01-23 MED ORDER — THIAMINE MONONITRATE 100 MG PO TABS
100.0000 mg | ORAL_TABLET | Freq: Every day | ORAL | Status: DC
  Administered 2023-01-23 – 2023-01-24 (×2): 100 mg via ORAL
  Filled 2023-01-23: qty 1

## 2023-01-23 NOTE — ED Triage Notes (Signed)
EMS reports from home, Daughter called from [place] states possible excessive ETOH ingestion. Hx of SI.   BP 120/82 HR 88 RR 18 Sp02 99 RA CBG 100

## 2023-01-23 NOTE — ED Notes (Signed)
Pt belongings placed in a bag with label, 2 bags placed in cabinet at nurses station. Pt has a yellow necklace and one earring in a biohazard bag inside bag.

## 2023-01-23 NOTE — Progress Notes (Addendum)
Pt was accepted to [facility] 01/24/23 PENDING Negative COVID, and IVC paperwork to be sent to [place] at [phone] for review  Pt meets inpatient criteria per Delfin Gant, NP   Attending Physician will be Dr. Dwyane Dee  Report can be called to: [phone]   Pt can arrive after 8:00am  Care Team notified: Night Aubrey Wynonia Hazard, RN, Night [place] Nash Mantis, RN, Drema Halon, RN, [place], RN, Chinwendu Carolan Shiver, NP, [place], [place], [place], Evette Georges, NP, Marijean Niemann, LPN, [address], [place], [place] 01/23/2023 @ 11:00 PM

## 2023-01-23 NOTE — ED Notes (Signed)
Call received from pt daughter  Claris Pong, pt gave verbal permission to talk with her and disclose her medical and psychiatric information, who stated  mother was recently discharged for same issues of ETOH abuse leading to depression then suicide attempt by overdose. Daughter further states that Mrs Seckman has had several admissions to rehab and shortly after discharge she relapses  to same issues.

## 2023-01-23 NOTE — Consult Note (Signed)
[facility] ED ASSESSMENT   Reason for Consult:  Psychiatry evaluation Referring Physician:  ER Physician Patient Identification: Debra Rhodes MRN:  [id number] ED Chief Complaint: Alcohol abuse with intoxication [facility])  Diagnosis:  Principal Problem:   Alcohol abuse with intoxication ([place]) Active Problems:   MDD (major depressive disorder), recurrent severe, without psychosis [facility])   ED Assessment Time Calculation: No data recorded  Subjective:   Debra Rhodes is a 62 y.o. female patient admitted with hx of MDD, .Suicide idea, Anxiety disorder, Bipolar disorder and Alcoholism was brought in by EMS called by her daughter from [place] reporting Excessive Alcohol intake.  Patient has a hx of  SI-OD.  She has hx of Alcohol withdrawal seizures.  HPI:  Patient was seen this evening tearful.  Patient reports long hx of Alcoholism most of her adult life.  She has been to 12 alcohol rehabilitation centers in the past .  Last rehab was 5 years ago.  She reports that she drinks from morning to night time.  She lives alone she says.  Her daughter called EMS to go to patient home because she suspected that she had taken too much alcohol.  Patient reports she drank two regular bottles of wine all day.  She want to go to rehab treatment at [facility] and she has been accepted but the condition  for acceptance is to get detox treatment before coming on the 11th of March..  She reports poor appetite and sleep.  Because of her excessive use of Alcohol she feels depressed and a failure.  Patient denies SI/HI/AVH.  She has mild tremors of her fingers.  Alcohol level result is not available at this time.  Due to hx of Alcohol withdrawal seizures we will monitor patient closely.  We will place her on CIWA protocol using Ativan for coverage.  Plan is to seek inpatient Detox treatment in a controlled environment for safety since she lives alone and has hx of alcohol withdrawal seizures.  We will seek bed placement at any  facility with available bed. Past Psychiatric History: hx of MDD, .Suicide idea, Anxiety disorder, Bipolar disorder and Alcoholism.  Hx of suicide attempt by OD. One admission at [facility] last year for treatment of anxiety and depression  Risk to Self or Others: Is the patient at risk to self? No Has the patient been a risk to self in the past 6 months? No Has the patient been a risk to self within the distant past? No Is the patient a risk to others? No Has the patient been a risk to others in the past 6 months? No Has the patient been a risk to others within the distant past? No  [place] Scale:  [place] ED from 01/23/2023 in [facility] Emergency Department at [facility] ED from 12/01/2022 in [facility] Emergency Department at [facility] Admission (Discharged) from 06/28/2022 in [place] [facility]  C-SSRS RISK CATEGORY No Risk No Risk High Risk       AIMS:  , , ,  ,   ASAM:    Substance Abuse:     Past Medical History:  Past Medical History:  Diagnosis Date   Alcohol abuse 01/21/2021   Anxiety    Depression    Elevated serum hCG 09/27/2021   Fatigue 09/27/2021   History of alcohol abuse 09/27/2021   Hypokalemia    Iron deficiency 02/22/2022   Overweight (BMI 25.0-29.9) 05/14/2019   Pain in joint of right shoulder  12/24/2019   Pain in right knee 02/04/2019   PONV (postoperative nausea and vomiting)     Past Surgical History:  Procedure Laterality Date   AUGMENTATION MAMMAPLASTY Bilateral 1996   BACK SURGERY     BREAST BIOPSY Right    CESAREAN SECTION     FRACTURE SURGERY     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  Father -substance abuse. Social History:  Social History   Substance and Sexual Activity  Alcohol Use Yes   Comment: reports recent relapse on alcohol after multiple 3 months stints of sobriety.     Social History   Substance and Sexual Activity  Drug Use No    Social History   Socioeconomic History   Marital status: Divorced    Spouse name: Not on file   Number of children: Not on file   Years of education: Not on file   Highest education level: Not on file  Occupational History   Not on file  Tobacco Use   Smoking status: Never   Smokeless tobacco: Never  Vaping Use   Vaping Use: Never used  Substance and Sexual Activity   Alcohol use: Yes    Comment: reports recent relapse on alcohol after multiple 3 months stints of sobriety.   Drug use: No   Sexual activity: Not Currently  Other Topics Concern   Not on file  Social History Narrative   Not on file   Social Determinants of Health   Financial Resource Strain: Not on file  Food Insecurity: Not on file  Transportation Needs: Not on file  Physical Activity: Not on file  Stress: Not on file  Social Connections: Not on file   Additional Social History:    Allergies:   Allergies  Allergen Reactions   Latex Shortness Of Breath, Swelling and Rash    Labs:  Results for orders placed or performed during the hospital encounter of 01/23/23 (from the past 48 hour(s))  CBC with Diff     Status: None   Collection Time: 01/23/23  7:00 PM  Result Value Ref Range   WBC 4.2 4.0 - 10.5 K/uL   RBC 4.10 3.87 - 5.11 MIL/uL   Hemoglobin 13.3 12.0 - 15.0 g/dL   HCT 39.5 36.0 - 46.0 %   MCV 96.3 80.0 - 100.0 fL   MCH 32.4 26.0 - 34.0 pg   MCHC 33.7 30.0 - 36.0 g/dL   RDW 13.9 11.5 - 15.5 %   Platelets 262 150  - 400 K/uL   nRBC 0.0 0.0 - 0.2 %   Neutrophils Relative % 41 %   Neutro Abs 1.8 1.7 - 7.7 K/uL   Lymphocytes Relative 48 %   Lymphs Abs 2.0 0.7 - 4.0 K/uL   Monocytes Relative 8 %   Monocytes Absolute 0.3 0.1 - 1.0 K/uL   Eosinophils Relative 2 %   Eosinophils Absolute 0.1 0.0 - 0.5 K/uL  Basophils Relative 1 %   Basophils Absolute 0.1 0.0 - 0.1 K/uL   Immature Granulocytes 0 %   Abs Immature Granulocytes 0.01 0.00 - 0.07 K/uL    Comment: Performed at [facility], [place] [address], [place], [place] [address]    Current Facility-Administered Medications  Medication Dose Route Frequency Provider Last Rate Last Admin   folic acid (FOLVITE) tablet 1 mg  1 mg Oral Daily Brelyn Woehl C, NP       LORazepam (ATIVAN) tablet 1-4 mg  1-4 mg Oral Q1H PRN Delfin Gant, NP       multivitamin with minerals tablet 1 tablet  1 tablet Oral Daily Sabena Winner C, NP       thiamine (VITAMIN B1) tablet 100 mg  100 mg Oral Daily Rollande Thursby C, NP       Or   thiamine (VITAMIN B1) injection 100 mg  100 mg Intravenous Daily Charmaine Downs C, NP       Current Outpatient Medications  Medication Sig Dispense Refill   cetirizine (ZYRTEC) 10 MG tablet Take 10 mg by mouth daily.     estradiol (ESTRACE) 0.5 MG tablet Take 1 tablet (0.5 mg total) by mouth daily. 90 tablet 1   gabapentin (NEURONTIN) 100 MG capsule Take 1 capsule (100 mg total) by mouth 3 (three) times daily as needed. 180 capsule 1   lamoTRIgine (LAMICTAL) 100 MG tablet Take 1 tablet (100 mg total) by mouth daily. 90 tablet 1   Multiple Vitamin (MULTIVITAMIN) tablet Take 1 tablet by mouth daily.     PARoxetine (PAXIL) 20 MG tablet Take 1 tablet (20 mg total) by mouth daily. 90 tablet 1   progesterone (PROMETRIUM) 100 MG capsule Take 1 capsule (100 mg total) by mouth at bedtime. 90 capsule 1   sulfamethoxazole-trimethoprim (BACTRIM DS) 800-160 MG tablet Take 1 tablet by mouth 2 (two) times daily after a  meal. (Patient not taking: Reported on 01/09/2023) 6 tablet 0   traZODone (DESYREL) 50 MG tablet Take 1-2 tabs at bedtime as needed for insomnia 180 tablet 1    Musculoskeletal: Strength & Muscle Tone:  seen sitting in stretcher Gait & Station:  seen sitting in stretcher Patient leans:  see above   Psychiatric Specialty Exam: Presentation  General Appearance:  Casual; Disheveled  Eye Contact: Good  Speech: Clear and Coherent; Normal Rate  Speech Volume: Normal  Handedness: Right   Mood and Affect  Mood: Anxious; Depressed  Affect: Congruent; Depressed   Thought Process  Thought Processes: Coherent; Goal Directed; Linear  Descriptions of Associations:Intact  Orientation:Full (Time, Place and Person)  Thought Content:Logical  History of Schizophrenia/Schizoaffective disorder:No data recorded Duration of Psychotic Symptoms:No data recorded Hallucinations:Hallucinations: None  Ideas of Reference:None  Suicidal Thoughts:Suicidal Thoughts: No  Homicidal Thoughts:Homicidal Thoughts: No   Sensorium  Memory: Immediate Good; Recent Good; Remote Good  Judgment: Intact  Insight: Present   Executive Functions  Concentration: Good  Attention Span: Good  Recall: Good  Fund of Knowledge: Good  Language: Good   Psychomotor Activity  Psychomotor Activity: Psychomotor Activity: Normal   Assets  Assets: Communication Skills; Desire for Improvement; Housing; Resilience    Sleep  Sleep: Sleep: Fair   Physical Exam: Physical Exam Vitals and nursing note reviewed.  HENT:     Head: Normocephalic.     Nose: Nose normal.  Cardiovascular:     Rate and Rhythm: Normal rate and regular rhythm.  Pulmonary:     Effort: Pulmonary effort is  normal.  Musculoskeletal:        General: Normal range of motion.     Cervical back: Normal range of motion.  Skin:    General: Skin is warm and dry.  Neurological:     General: No focal deficit  present.     Mental Status: She is alert and oriented to person, place, and time.    Review of Systems  Constitutional:  Positive for weight loss.  HENT: Negative.    Eyes: Negative.   Respiratory: Negative.    Cardiovascular: Negative.   Gastrointestinal: Negative.   Genitourinary: Negative.   Musculoskeletal: Negative.   Skin: Negative.   Neurological: Negative.   Endo/Heme/Allergies: Negative.   Psychiatric/Behavioral:  Positive for depression. The patient is nervous/anxious.    There were no vitals taken for this visit. There is no height or weight on file to calculate BMI.  Medical Decision Making: Due to hx of Alcohol withdrawal seizures we will monitor patient closely.  We will place her on CIWA protocol using Ativan for coverage.  Plan is to seek inpatient Detox treatment in a controlled environment for safety since she lives alone and has hx of alcohol withdrawal seizures.  We will seek bed placement at any facility with available bed.  Problem 1: Alcohol abuse with intoxication.  Problem 2: Recurrent Major Depressive disorder, severe without Psychotic features. Disposition:  Admit for Alcohol detox, seek placement.  Delfin Gant, NP-PMHNP-BC 01/23/2023 8:22 PM

## 2023-01-23 NOTE — ED Provider Notes (Signed)
[place] [facility] Provider Note   CSN: [id number] Arrival date & time: 01/23/23  1841     History  Chief Complaint  Patient presents with   Alcohol Intoxication    Debra Rhodes is a 62 y.o. female.  62 yo F with a chief complaint of alcohol intoxication.  The patient reportedly called EMS because she was struggling and felt like she needed to come to the hospital for help with alcohol.  She been trying to detox at home.  She felt like she was unsuccessful.  Had about a third of 1/5 of alcohol just about an hour ago.  She denies any other coingestants.  She denies suicidal or homicidal ideology.  Denies chest pain difficulty breathing abdominal pain nausea vomiting or diarrhea.   Alcohol Intoxication       Home Medications Prior to Admission medications   Medication Sig Start Date End Date Taking? Authorizing Provider  cetirizine (ZYRTEC) 10 MG tablet Take 10 mg by mouth daily.    [provider]  estradiol (ESTRACE) 0.5 MG tablet Take 1 tablet (0.5 mg total) by mouth daily. 09/28/22   Jeanie Sewer, NP  gabapentin (NEURONTIN) 100 MG capsule Take 1 capsule (100 mg total) by mouth 3 (three) times daily as needed. 01/09/23 02/08/23  Thayer Headings, PMHNP  lamoTRIgine (LAMICTAL) 100 MG tablet Take 1 tablet (100 mg total) by mouth daily. 01/09/23 04/09/23  Thayer Headings, PMHNP  Multiple Vitamin (MULTIVITAMIN) tablet Take 1 tablet by mouth daily.    [provider]  PARoxetine (PAXIL) 20 MG tablet Take 1 tablet (20 mg total) by mouth daily. 01/09/23   Thayer Headings, PMHNP  progesterone (PROMETRIUM) 100 MG capsule Take 1 capsule (100 mg total) by mouth at bedtime. 09/28/22   Jeanie Sewer, NP  sulfamethoxazole-trimethoprim (BACTRIM DS) 800-160 MG tablet Take 1 tablet by mouth 2 (two) times daily after a meal. Patient not taking: Reported on 01/09/2023 01/05/23   Jeanie Sewer, NP  traZODone (DESYREL) 50 MG tablet Take  1-2 tabs at bedtime as needed for insomnia 01/09/23   Thayer Headings, PMHNP  sertraline (ZOLOFT) 100 MG tablet Decrease to 1.5 tabs daily for 5 days, then 1 tab daily for 5 days, then 1/2 tablet daily for 5 days, then stop 11/23/20 12/23/20  Thayer Headings, PMHNP      Allergies    Latex    Review of Systems   Review of Systems  Physical Exam Updated Vital Signs BP 116/83   Pulse 76   Temp 98.5 F (36.9 C) (Oral)   Resp 16   Ht '5\' 3"'$  (1.6 m)   Wt 59 kg   SpO2 100%   BMI 23.03 kg/m  Physical Exam Vitals and nursing note reviewed.  Constitutional:      General: She is not in acute distress.    Appearance: She is well-developed. She is not diaphoretic.     Comments: Clinically intoxicated  HENT:     Head: Normocephalic and atraumatic.  Eyes:     Pupils: Pupils are equal, round, and reactive to light.  Cardiovascular:     Rate and Rhythm: Normal rate and regular rhythm.     Heart sounds: No murmur heard.    No friction rub. No gallop.  Pulmonary:     Effort: Pulmonary effort is normal.     Breath sounds: No wheezing or rales.  Abdominal:     General: There is no distension.     Palpations: Abdomen is  soft.     Tenderness: There is no abdominal tenderness.  Musculoskeletal:        General: No tenderness.     Cervical back: Normal range of motion and neck supple.  Skin:    General: Skin is warm and dry.  Neurological:     Mental Status: She is alert and oriented to person, place, and time.  Psychiatric:        Behavior: Behavior is withdrawn.     ED Results / Procedures / Treatments   Labs (all labs ordered are listed, but only abnormal results are displayed) Labs Reviewed  COMPREHENSIVE METABOLIC PANEL - Abnormal; Notable for the following components:      Result Value   BUN 6 (*)    Calcium 8.4 (*)    All other components within normal limits  SALICYLATE LEVEL - Abnormal; Notable for the following components:   Salicylate Lvl [id number] (*)    All other components  within normal limits  ACETAMINOPHEN LEVEL - Abnormal; Notable for the following components:   Acetaminophen (Tylenol), Serum <10 (*)    All other components within normal limits  RAPID URINE DRUG SCREEN, HOSP PERFORMED  CBC WITH DIFFERENTIAL/PLATELET  ETHANOL    EKG None  Radiology No results found.  Procedures Procedures    Medications Ordered in ED Medications  LORazepam (ATIVAN) tablet 1-4 mg (1 mg Oral Given 01/23/23 2106)  thiamine (VITAMIN B1) tablet 100 mg (100 mg Oral Given 01/23/23 2106)    Or  thiamine (VITAMIN B1) injection 100 mg ( Intravenous See Alternative [id number])  folic acid (FOLVITE) tablet 1 mg (1 mg Oral Given 01/23/23 2104)  multivitamin with minerals tablet 1 tablet (1 tablet Oral Given 01/23/23 2104)    ED Course/ Medical Decision Making/ A&P                             Medical Decision Making Amount and/or Complexity of Data Reviewed Labs: ordered.   62 yo F with a significant past medical history of alcohol use disorder as well as multiple suicide attempts including 1 that resulted in cardiac arrest and ICU admission.  The patient called EMS reportedly because she was struggling with alcohol.  She is carrying a large amount of pills in her purse which she is reluctant to let anyone see your take.  This is somewhat concerning that she plans on overdosing.  Will have TTS evaluate.  TTS evaluated the patient and think she would qualify for alcohol rehab.  Patient unfortunately is not willing to stay and keeps trying to elope from the department.  She is too intoxicated to make a reasonable decision and I worry that she would and do significant harm to herself where she to leave.  My other concern is that she had a very large amounts of pills in her purse and has had a almost fatal overdose in the past.  I filled out IVC paperwork for her.  No significant electrolyte abnormality UDS negative.  No significant anemia.  Feel she is medically clear.  The  patients results and plan were reviewed and discussed.   Any x-rays performed were independently reviewed by myself.   Differential diagnosis were considered with the presenting HPI.  Medications  LORazepam (ATIVAN) tablet 1-4 mg (1 mg Oral Given 01/23/23 2106)  thiamine (VITAMIN B1) tablet 100 mg (100 mg Oral Given 01/23/23 2106)    Or  thiamine (VITAMIN B1) injection 100 mg (  Intravenous See Alternative [id number])  folic acid (FOLVITE) tablet 1 mg (1 mg Oral Given 01/23/23 2104)  multivitamin with minerals tablet 1 tablet (1 tablet Oral Given 01/23/23 2104)    Vitals:   01/23/23 2102 01/23/23 2137  BP: 116/83 116/83  Pulse: 76 76  Resp: 16   Temp: 98.5 F (36.9 C)   TempSrc: Oral   SpO2: 100%   Weight: 59 kg   Height: '5\' 3"'$  (1.6 m)     Final diagnoses:  Alcoholic intoxication without complication ([place])             Final Clinical Impression(s) / ED Diagnoses Final diagnoses:  Alcoholic intoxication without complication [facility])    Rx / DC Orders ED Discharge Orders     None         Deno Etienne, DO 01/23/23 2157

## 2023-01-23 NOTE — Social Work (Signed)
CSW has reviewed the chart at this time [facility] was consulted for SUD resources. This CSW has added inpatient as well outpatient. Additional resource for [facility]. At this time the patient [place] under [facility] once cleared if TOC is still needed please reach out.

## 2023-01-24 ENCOUNTER — Other Ambulatory Visit (INDEPENDENT_AMBULATORY_CARE_PROVIDER_SITE_OTHER)
Admission: EM | Admit: 2023-01-24 | Discharge: 2023-01-27 | Disposition: A | Discharge status: Home / Self Care | Payer: 59 | Source: Home / Self Care | Admitting: Student

## 2023-01-24 DIAGNOSIS — F10229 Alcohol dependence with intoxication, unspecified: Secondary | ICD-10-CM | POA: Insufficient documentation

## 2023-01-24 DIAGNOSIS — F1090 Alcohol use, unspecified, uncomplicated: Secondary | ICD-10-CM | POA: Diagnosis not present

## 2023-01-24 LAB — URINALYSIS, ROUTINE W REFLEX MICROSCOPIC
Bilirubin Urine: NEGATIVE
Glucose, UA: NEGATIVE mg/dL
Ketones, ur: NEGATIVE mg/dL
Leukocytes,Ua: NEGATIVE
Nitrite: NEGATIVE
Protein, ur: NEGATIVE mg/dL
Specific Gravity, Urine: 1.006 (ref 1.005–1.030)
pH: 7 (ref 5.0–8.0)

## 2023-01-24 MED ORDER — DICLOFENAC SODIUM 1 % EX GEL
2.0000 g | Freq: Three times a day (TID) | CUTANEOUS | Status: DC
  Administered 2023-01-24 – 2023-01-26 (×5): 2 g via TOPICAL
  Filled 2023-01-24 (×2): qty 100

## 2023-01-24 MED ORDER — HYDROXYZINE HCL 25 MG PO TABS: 25.0000 mg | ORAL_TABLET | Freq: Four times a day (QID) | ORAL | Status: DC | PRN

## 2023-01-24 MED ORDER — ADULT MULTIVITAMIN W/MINERALS CH
1.0000 | ORAL_TABLET | Freq: Every day | ORAL | Status: DC
  Administered 2023-01-24 – 2023-01-27 (×4): 1 via ORAL
  Filled 2023-01-24 (×4): qty 1

## 2023-01-24 MED ORDER — CHLORDIAZEPOXIDE HCL 25 MG PO CAPS
25.0000 mg | ORAL_CAPSULE | Freq: Three times a day (TID) | ORAL | Status: AC
  Administered 2023-01-25 (×3): 25 mg via ORAL
  Filled 2023-01-24 (×3): qty 1

## 2023-01-24 MED ORDER — CHLORDIAZEPOXIDE HCL 25 MG PO CAPS
25.0000 mg | ORAL_CAPSULE | Freq: Four times a day (QID) | ORAL | Status: AC
  Administered 2023-01-24 (×4): 25 mg via ORAL
  Filled 2023-01-24 (×4): qty 1

## 2023-01-24 MED ORDER — PAROXETINE HCL 20 MG PO TABS
20.0000 mg | ORAL_TABLET | Freq: Every day | ORAL | Status: DC
  Administered 2023-01-24 – 2023-01-27 (×4): 20 mg via ORAL
  Filled 2023-01-24 (×4): qty 1

## 2023-01-24 MED ORDER — ACETAMINOPHEN 325 MG PO TABS
650.0000 mg | ORAL_TABLET | Freq: Four times a day (QID) | ORAL | Status: DC | PRN
  Administered 2023-01-26: 650 mg via ORAL
  Filled 2023-01-24: qty 2

## 2023-01-24 MED ORDER — LAMOTRIGINE 100 MG PO TABS: 100.0000 mg | ORAL_TABLET | Freq: Every day | ORAL | Status: DC

## 2023-01-24 MED ORDER — LORAZEPAM 1 MG PO TABS: 1.0000 mg | ORAL_TABLET | Freq: Four times a day (QID) | ORAL | Status: DC | PRN

## 2023-01-24 MED ORDER — LOPERAMIDE HCL 2 MG PO CAPS: 2.0000 mg | ORAL_CAPSULE | ORAL | Status: AC | PRN

## 2023-01-24 MED ORDER — HYDROXYZINE HCL 10 MG PO TABS
10.0000 mg | ORAL_TABLET | Freq: Three times a day (TID) | ORAL | Status: DC | PRN
  Administered 2023-01-24: 10 mg via ORAL
  Filled 2023-01-24: qty 1

## 2023-01-24 MED ORDER — CHLORDIAZEPOXIDE HCL 25 MG PO CAPS: 25.0000 mg | ORAL_CAPSULE | Freq: Every day | ORAL | Status: DC

## 2023-01-24 MED ORDER — GABAPENTIN 100 MG PO CAPS
100.0000 mg | ORAL_CAPSULE | Freq: Three times a day (TID) | ORAL | Status: DC
  Administered 2023-01-24 – 2023-01-27 (×10): 100 mg via ORAL
  Filled 2023-01-24 (×10): qty 1

## 2023-01-24 MED ORDER — TRAZODONE HCL 50 MG PO TABS
50.0000 mg | ORAL_TABLET | Freq: Every day | ORAL | Status: DC
  Administered 2023-01-24 – 2023-01-26 (×3): 50 mg via ORAL
  Filled 2023-01-24 (×3): qty 1

## 2023-01-24 MED ORDER — LAMOTRIGINE 100 MG PO TABS
100.0000 mg | ORAL_TABLET | Freq: Every day | ORAL | Status: DC
  Administered 2023-01-24 – 2023-01-26 (×3): 100 mg via ORAL
  Filled 2023-01-24 (×3): qty 1

## 2023-01-24 MED ORDER — CHLORDIAZEPOXIDE HCL 25 MG PO CAPS
25.0000 mg | ORAL_CAPSULE | ORAL | Status: DC
  Administered 2023-01-26: 25 mg via ORAL
  Filled 2023-01-24: qty 1

## 2023-01-24 MED ORDER — THIAMINE MONONITRATE 100 MG PO TABS
100.0000 mg | ORAL_TABLET | Freq: Every day | ORAL | Status: DC
  Administered 2023-01-25 – 2023-01-27 (×3): 100 mg via ORAL
  Filled 2023-01-24 (×3): qty 1

## 2023-01-24 MED ORDER — NICOTINE 21 MG/24HR TD PT24: 21.0000 mg | MEDICATED_PATCH | Freq: Every day | TRANSDERMAL | Status: DC | PRN

## 2023-01-24 MED ORDER — ONDANSETRON 4 MG PO TBDP: 4.0000 mg | ORAL_TABLET | Freq: Four times a day (QID) | ORAL | Status: AC | PRN

## 2023-01-24 MED ORDER — MAGNESIUM HYDROXIDE 400 MG/5ML PO SUSP: 30.0000 mL | Freq: Every day | ORAL | Status: DC | PRN

## 2023-01-24 MED ORDER — LORAZEPAM 2 MG/ML IJ SOLN: 2.0000 mg | Freq: Once | INTRAMUSCULAR | Status: DC | PRN

## 2023-01-24 MED ORDER — ALUM & MAG HYDROXIDE-SIMETH 200-200-20 MG/5ML PO SUSP: 30.0000 mL | ORAL | Status: DC | PRN

## 2023-01-24 NOTE — ED Notes (Signed)
IVC papers faxed to [facility] and [facility]

## 2023-01-24 NOTE — ED Notes (Signed)
Patient observed/assessed in room in bed appearing in no immediate distress resting peacefully. Q15 minute checks continued by MHT and nursing staff. Will continue to monitor and support. 

## 2023-01-24 NOTE — ED Provider Notes (Signed)
Emergency Medicine Observation Re-evaluation Note  Debra Rhodes is a 62 y.o. female, seen on rounds today.  Pt initially presented to the ED for complaints of Alcohol Intoxication Currently, the patient is resting comfortably.  Physical Exam  BP 125/80   Pulse 99   Temp 98.8 F (37.1 C) (Oral)   Resp 16   Ht '5\' 3"'$  (1.6 m)   Wt 59 kg   SpO2 98%   BMI 23.03 kg/m  Physical Exam General: No acute distress Cardiac: Regular rate, max heart rate was in the 70s overnight Lungs: No respiratory distress Psych: Calm  ED Course / MDM  EKG:   I have reviewed the labs performed to date as well as medications administered while in observation.  Recent changes in the last 24 hours include -no new changes.  Psychiatry to reassess the patient, and consider inpatient admission.  Plan  Current plan is for continue expectant management.  CIWA protocol ordered.    Varney Biles, MD 01/24/23 [phone]

## 2023-01-24 NOTE — ED Notes (Signed)
Patient admitted directly from [facility].  Patient relapsed on alcohol and became suicidal while intoxicated.  Patients daughter called EMS and she was brought to ED.  Patient was found to have a bag of pills on her and then attempted to elope so she was IVC'd.  Patient no longer intoxicated and appears to be in etoh withdrawal at this time with CIWA at 11.  Patient also had a panic attack with trembling muscles and unable to stop shaking.  Patient eventually was able to relax somewhat with guided support.  Patient was able to continue and complete admission process.  She was shown to her room and given several blankets.  She was also given librium, vistaril and MVI.  Patient denies suicidal ideation and has no recollection of trying to elope.  She is resting now and staff will monitor accordingly.

## 2023-01-24 NOTE — ED Notes (Signed)
Pt. Attended wrap up group.

## 2023-01-24 NOTE — ED Provider Notes (Signed)
Facility Based Crisis Admission H&P  Date: 01/24/23 Patient Name: Debra Rhodes MRN: [id number] Chief Complaint: IVC, alcohol intoxication  Diagnoses:  Final diagnoses:  Alcohol use disorder    HPI:  The patient is a 62 year old female with a history of alcohol use disorder and major depression, several previous psychiatric admissions, most recently in August after a large overdose that resulted in intubation and ICU stay.  She has been to residential rehab many times but has had difficulty with sobriety.  The patient presented to the emergency department when her daughter called EMS due to concern that she had consumed too much alcohol.  While there the patient was put under involuntary commitment by the emergency department physician due to her level of intoxication, recent serious suicide attempt, and having a large amount of prescription medication in her purse.  Nursing staff communicated handoff that the patient reported suicidal thoughts.  She is documented as denying suicidal thoughts to the emergency department physician and the psychiatric provider.  The patient was transferred to the facility based crisis.  On assessment 3/6, the patient exhibits depressed affect but has a relatively linear and logical thought process.  She reports that she has 3 daughters living in [place] and that she moved to [place] 7 years ago to go to [facility] for residential rehab.  She states that afterwards she did well in AA.  Unfortunately she says her relationship with her boyfriend let her back into drinking and for the past 3 years she has been consuming alcohol heavily.  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  She states that she is drinking 2-3 bottles of wine per day.  She states that she is experiencing mild withdrawal symptoms of anxiety and shakiness.  Her last drink was the afternoon of 3/5.  The patient endorses some depressive symptoms  but says that she has not had suicidal thoughts recently.  Discussed with her that the IVC will remain in place given the safety concerns that the emergency department physician had.  PHQ 2-9:  [place] Office Visit from 02/22/2022 in [place] ED from 10/26/2021 in [facility] Emergency Department at [place] that you would be better off dead, or of hurting yourself in some way Not at all More than half the days  PHQ-9 Total Score 0 13       [place] ED from 01/23/2023 in [facility] Emergency Department at [facility] ED from 12/01/2022 in [facility] Emergency Department at [facility] Admission (Discharged) from 06/28/2022 in [place] [facility]  C-SSRS RISK CATEGORY No Risk No Risk High Risk       Total Time spent with patient: 20 minutes  Musculoskeletal  Strength & Muscle Tone: within normal limits Gait & Station: normal Patient leans: N/A  Psychiatric Specialty Exam  Presentation General Appearance: Appropriate for Environment  Eye Contact:Fair  Speech:Clear and Coherent  Speech Volume:Normal  Handedness:-- (not assessed)   Mood and Affect  Mood: depressed  Affect:Congruent   Thought Process  Thought Processes:Coherent; Linear  Descriptions of Associations:Intact  Orientation:Full (Time, Place and Person)  Thought Content:Logical    Hallucinations:Hallucinations: None  Ideas of Reference:None  Suicidal Thoughts:Suicidal Thoughts: No  Homicidal Thoughts:Homicidal Thoughts: No   Sensorium  Memory:Immediate Fair; Recent Fair; Remote [place]  Insight:Fair   Executive Functions  Concentration:Fair  Attention Span:Fair  [place]  Language:Fair  Psychomotor Activity  Psychomotor Activity:Psychomotor Activity: Normal   Assets  Assets:Communication Skills; Resilience   Sleep  Sleep:Sleep:  Fair   Nutritional Assessment (For OBS and FBC admissions only) Has the patient had a weight loss or gain of 10 pounds or more in the last 3 months?: No Has the patient had a decrease in food intake/or appetite?: Yes Does the patient have dental problems?: No Does the patient have eating habits or behaviors that may be indicators of an eating disorder including binging or inducing vomiting?: No Has the patient recently lost weight without trying?: 0 Has the patient been eating poorly because of a decreased appetite?: 0 Malnutrition Screening Tool Score: 0    Physical Exam Constitutional:      Appearance: the patient is not toxic-appearing.  Pulmonary:     Effort: Pulmonary effort is normal.  Neurological:     General: No focal deficit present.     Mental Status: the patient is alert and oriented to person, place, and time.   Review of Systems  Respiratory:  Negative for shortness of breath.   Cardiovascular:  Negative for chest pain.  Gastrointestinal:  Negative for abdominal pain, constipation, diarrhea, nausea and vomiting.  Neurological:  Negative for headaches.    BP (!) 141/82   Pulse (!) 106   Temp 99.5 F (37.5 C) (Oral)   Resp 18   SpO2 100%    Past Psychiatric History: as above   Is the patient at risk to self? No  Has the patient been a risk to self in the past 6 months? No .    Has the patient been a risk to self within the distant past? No   Is the patient a risk to others? No   Has the patient been a risk to others in the past 6 months? No   Has the patient been a risk to others within the distant past? No   Past Medical History: as above Family History: none Social History: as above   Last Labs:  Admission on 01/23/2023  Component Date Value Ref Range Status   Sodium 01/23/2023 138  135 - 145 mmol/L Final   Potassium 01/23/2023 3.8  3.5 - 5.1 mmol/L Final   Chloride 01/23/2023 104  98 - 111 mmol/L Final   CO2 01/23/2023 24  22 - 32 mmol/L Final    Glucose, Bld 01/23/2023 99  70 - 99 mg/dL Final   Glucose reference range applies only to samples taken after fasting for at least 8 hours.   BUN 01/23/2023 6 (L)  8 - 23 mg/dL Final   Creatinine, Ser 01/23/2023 0.70  0.44 - 1.00 mg/dL Final   Calcium 01/23/2023 8.4 (L)  8.9 - 10.3 mg/dL Final   Total Protein 01/23/2023 7.0  6.5 - 8.1 g/dL Final   Albumin 01/23/2023 4.1  3.5 - 5.0 g/dL Final   AST 01/23/2023 23  15 - 41 U/L Final   ALT 01/23/2023 13  0 - 44 U/L Final   Alkaline Phosphatase 01/23/2023 39  38 - 126 U/L Final   Total Bilirubin 01/23/2023 0.5  0.3 - 1.2 mg/dL Final   GFR, Estimated 01/23/2023 >60  >60 mL/min Final   Comment: (NOTE) Calculated using the CKD-EPI Creatinine Equation (2021)    Anion gap 01/23/2023 10  5 - 15 Final   Comment: ELECTROLYTES REPEATED TO VERIFY Performed at [facility], [place] [address] [place], [place] [address]    Alcohol, Ethyl (B) 01/23/2023 375 (HH)  <  10 mg/dL Final   Comment: CRITICAL RESULT CALLED TO, READ BACK BY AND VERIFIED WITH B,D RN AT 2158 01/23/23 BY VAZQUEZJ (NOTE) Lowest detectable limit for serum alcohol is 10 mg/dL.  For medical purposes only. Performed at [facility], [place] [address], [place], [place] [address]    Opiates 01/23/2023 NONE DETECTED  NONE DETECTED Final   Cocaine 01/23/2023 NONE DETECTED  NONE DETECTED Final   Benzodiazepines 01/23/2023 NONE DETECTED  NONE DETECTED Final   Amphetamines 01/23/2023 NONE DETECTED  NONE DETECTED Final   Tetrahydrocannabinol 01/23/2023 NONE DETECTED  NONE DETECTED Final   Barbiturates 01/23/2023 NONE DETECTED  NONE DETECTED Final   Comment: (NOTE) DRUG SCREEN FOR MEDICAL PURPOSES ONLY.  IF CONFIRMATION IS NEEDED FOR ANY PURPOSE, NOTIFY LAB WITHIN 5 DAYS.  LOWEST DETECTABLE LIMITS FOR URINE DRUG SCREEN Drug Class                     Cutoff (ng/mL) Amphetamine and metabolites    1000 Barbiturate and metabolites    200 Benzodiazepine                  200 Opiates and metabolites        300 Cocaine and metabolites        300 THC                            50 Performed at [facility], [place] [address] [place], [place] [address]    WBC 01/23/2023 4.2  4.0 - 10.5 K/uL Final   RBC 01/23/2023 4.10  3.87 - 5.11 MIL/uL Final   Hemoglobin 01/23/2023 13.3  12.0 - 15.0 g/dL Final   HCT 01/23/2023 39.5  36.0 - 46.0 % Final   MCV 01/23/2023 96.3  80.0 - 100.0 fL Final   MCH 01/23/2023 32.4  26.0 - 34.0 pg Final   MCHC 01/23/2023 33.7  30.0 - 36.0 g/dL Final   RDW 01/23/2023 13.9  11.5 - 15.5 % Final   Platelets 01/23/2023 262  150 - 400 K/uL Final   nRBC 01/23/2023 0.0  0.0 - 0.2 % Final   Neutrophils Relative % 01/23/2023 41  % Final   Neutro Abs 01/23/2023 1.8  1.7 - 7.7 K/uL Final   Lymphocytes Relative 01/23/2023 48  % Final   Lymphs Abs 01/23/2023 2.0  0.7 - 4.0 K/uL Final   Monocytes Relative 01/23/2023 8  % Final   Monocytes Absolute 01/23/2023 0.3  0.1 - 1.0 K/uL Final   Eosinophils Relative 01/23/2023 2  % Final   Eosinophils Absolute 01/23/2023 0.1  0.0 - 0.5 K/uL Final   Basophils Relative 01/23/2023 1  % Final   Basophils Absolute 01/23/2023 0.1  0.0 - 0.1 K/uL Final   Immature Granulocytes 01/23/2023 0  % Final   Abs Immature Granulocytes 01/23/2023 0.01  0.00 - 0.07 K/uL Final   Performed at [facility], [place] [address], [place], [place] [id number]   Salicylate Lvl [id number] <7.0 (L)  7.0 - 30.0 mg/dL Final   Performed at [place] [address], [place], [place] [address]   Acetaminophen (Tylenol), Serum 01/23/2023 <10 (L)  10 - 30 ug/mL Final   Comment: (NOTE) Therapeutic concentrations vary significantly. A range of 10-30 ug/mL  may be an effective concentration for many patients. However, some  are best treated at concentrations outside of this range. Acetaminophen concentrations >150 ug/mL at 4  hours after ingestion  and >50 ug/mL at 12 hours  after ingestion are often associated with  toxic reactions.  Performed at [facility], [place] [address] [place], [place] [address]    SARS Coronavirus 2 by RT PCR 01/23/2023 NEGATIVE  NEGATIVE Final   Comment: (NOTE) SARS-CoV-2 target nucleic acids are NOT DETECTED.  The SARS-CoV-2 RNA is generally detectable in upper respiratory specimens during the acute phase of infection. The lowest concentration of SARS-CoV-2 viral copies this assay can detect is 138 copies/mL. A negative result does not preclude SARS-Cov-2 infection and should not be used as the sole basis for treatment or other patient management decisions. A negative result may occur with  improper specimen collection/handling, submission of specimen other than nasopharyngeal swab, presence of viral mutation(s) within the areas targeted by this assay, and inadequate number of viral copies(<138 copies/mL). A negative result must be combined with clinical observations, patient history, and epidemiological information. The expected result is Negative.  Fact Sheet for Patients:  EntrepreneurPulse.com.au  Fact Sheet for Healthcare Providers:  IncredibleEmployment.be  This test is no                          t yet approved or cleared by the [place] FDA and  has been authorized for detection and/or diagnosis of SARS-CoV-2 by FDA under an Emergency Use Authorization (EUA). This EUA will remain  in effect (meaning this test can be used) for the duration of the COVID-19 declaration under Section 564(b)(1) of the Act, 21 U.S.C.section 360bbb-3(b)(1), unless the authorization is terminated  or revoked sooner.       Influenza A by PCR 01/23/2023 NEGATIVE  NEGATIVE Final   Influenza B by PCR 01/23/2023 NEGATIVE  NEGATIVE Final   Comment: (NOTE) The Xpert Xpress SARS-CoV-2/FLU/RSV plus assay is intended as an aid in the diagnosis of influenza from Nasopharyngeal swab  specimens and should not be used as a sole basis for treatment. Nasal washings and aspirates are unacceptable for Xpert Xpress SARS-CoV-2/FLU/RSV testing.  Fact Sheet for Patients: EntrepreneurPulse.com.au  Fact Sheet for Healthcare Providers: IncredibleEmployment.be  This test is not yet approved or cleared by the [place] FDA and has been authorized for detection and/or diagnosis of SARS-CoV-2 by FDA under an Emergency Use Authorization (EUA). This EUA will remain in effect (meaning this test can be used) for the duration of the COVID-19 declaration under Section 564(b)(1) of the Act, 21 U.S.C. section 360bbb-3(b)(1), unless the authorization is terminated or revoked.     Resp Syncytial Virus by PCR 01/23/2023 NEGATIVE  NEGATIVE Final   Comment: (NOTE) Fact Sheet for Patients: EntrepreneurPulse.com.au  Fact Sheet for Healthcare Providers: IncredibleEmployment.be  This test is not yet approved or cleared by the [place] FDA and has been authorized for detection and/or diagnosis of SARS-CoV-2 by FDA under an Emergency Use Authorization (EUA). This EUA will remain in effect (meaning this test can be used) for the duration of the COVID-19 declaration under Section 564(b)(1) of the Act, 21 U.S.C. section 360bbb-3(b)(1), unless the authorization is terminated or revoked.  Performed at [facility], [place] [address] [place], [place] [address]   Office Visit on 01/05/2023  Component Date Value Ref Range Status   Color, UA 01/05/2023 Orange   Final   Glucose, UA 01/05/2023 Negative  Negative Final   Bilirubin, UA 01/05/2023 Negative   Final   Ketones, UA 01/05/2023 Negative   Final   Spec Grav, UA  01/05/2023 1.020  1.010 - 1.025 Final   Blood, UA 01/05/2023 Moderate (A)   Final   pH, UA 01/05/2023 6.0  5.0 - 8.0 Final   Protein, UA 01/05/2023 Negative  Negative Final    Urobilinogen, UA 01/05/2023 1.0  0.2 or 1.0 E.U./dL Final   Nitrite, UA 01/05/2023 Positive (A)   Final   Leukocytes, UA 01/05/2023 Small (1+) (A)  Negative Final  Abstract on 09/28/2022  Component Date Value Ref Range Status   Cologuard 09/29/2021 Negative  Negative Final   HM Colonoscopy 09/29/2021 See Report (in chart)  See Report (in chart), Patient Reported Final    Allergies: Latex  Medications:  Facility Ordered Medications  Medication   LORazepam (ATIVAN) tablet 1-4 mg   thiamine (VITAMIN B1) tablet 100 mg   Or   thiamine (VITAMIN B1) injection 123XX123 mg   folic acid (FOLVITE) tablet 1 mg   multivitamin with minerals tablet 1 tablet   acetaminophen (TYLENOL) tablet 650 mg   alum & mag hydroxide-simeth (MAALOX/MYLANTA) 200-200-20 MG/5ML suspension 30 mL   magnesium hydroxide (MILK OF MAGNESIA) suspension 30 mL   hydrOXYzine (ATARAX) tablet 10 mg   nicotine (NICODERM CQ - dosed in mg/24 hours) patch 21 mg   multivitamin with minerals tablet 1 tablet   loperamide (IMODIUM) capsule 2-4 mg   ondansetron (ZOFRAN-ODT) disintegrating tablet 4 mg   chlordiazePOXIDE (LIBRIUM) capsule 25 mg   Followed by   Derrill Memo ON 01/25/2023] chlordiazePOXIDE (LIBRIUM) capsule 25 mg   Followed by   Derrill Memo ON 01/26/2023] chlordiazePOXIDE (LIBRIUM) capsule 25 mg   Followed by   Derrill Memo ON 01/27/2023] chlordiazePOXIDE (LIBRIUM) capsule 25 mg   [START ON 01/25/2023] thiamine (VITAMIN B1) tablet 100 mg   LORazepam (ATIVAN) tablet 1 mg   gabapentin (NEURONTIN) capsule 100 mg   PTA Medications  Medication Sig   cetirizine (ZYRTEC) 10 MG tablet Take 10 mg by mouth daily.   Multiple Vitamin (MULTIVITAMIN) tablet Take 1 tablet by mouth daily.   estradiol (ESTRACE) 0.5 MG tablet Take 1 tablet (0.5 mg total) by mouth daily. (Patient not taking: Reported on 01/24/2023)   progesterone (PROMETRIUM) 100 MG capsule Take 1 capsule (100 mg total) by mouth at bedtime. (Patient not taking: Reported on 01/24/2023)    sulfamethoxazole-trimethoprim (BACTRIM DS) 800-160 MG tablet Take 1 tablet by mouth 2 (two) times daily after a meal. (Patient not taking: Reported on 01/09/2023)   traZODone (DESYREL) 50 MG tablet Take 1-2 tabs at bedtime as needed for insomnia (Patient not taking: Reported on 01/24/2023)   gabapentin (NEURONTIN) 100 MG capsule Take 1 capsule (100 mg total) by mouth 3 (three) times daily as needed.   PARoxetine (PAXIL) 20 MG tablet Take 1 tablet (20 mg total) by mouth daily. (Patient not taking: Reported on 01/24/2023)   lamoTRIgine (LAMICTAL) 100 MG tablet Take 1 tablet (100 mg total) by mouth daily.   CELEBREX 100 MG capsule Take 100 mg by mouth 2 (two) times daily.    Long Term Goals: Improvement in symptoms so as ready for discharge  Short Term Goals: Patient will verbalize feelings in meetings with treatment team members., Patient will attend at least of 50% of the groups daily., Pt will complete the PHQ9 on admission, day 3 and discharge., Patient will participate in completing the [place], Patient will score a low risk of violence for 24 hours prior to discharge, and Patient will take medications as prescribed daily.  Medical Decision Making   Status: Involuntary, 24-hour  facility exam paperwork filled out by me  Alcohol use disorder, reported history of alcohol withdrawal seizures, no documented history of alcohol withdrawal seizures, (her described symptomatology is not consistent with seizure based on my interview) - Librium detox, CIWA with as needed Ativan, as needed Ativan injection for seizure  Continue patient's home prescriptions for generalized anxiety disorder - Paxil 20 mg daily - Gabapentin 100 mg 3 times daily - Trazodone 50 mg nightly for insomnia - Lamotrigine 100 mg nightly (patient reports consistent adherence)  Medical: - Lab work unremarkable - UDS negative, BAL 375 - Voltaren gel in place of patient's reported Celebrex (she reports  uncertainty as to whether she is taking this medication or a different 1)  Recommendations  Based on my evaluation the patient does not appear to have an emergency medical condition.  Corky Sox, MD 01/24/23  9:43 AM

## 2023-01-24 NOTE — Tx Team (Signed)
LCSW met with patient to assess current mood, affect, physical state, and inquire about needs/goals while here in [facility] and after discharge. Patient reports she presented due to her recent increase in alcohol consumption. Patient appeared to be somewhat groggy so limited information was obtained at this time. Patient reports she lives at home alone, however reports having support by her daughters and friends. Patient reports she currently works for [employer] in [place] [employer]. Patient reports being in recovery off and on for many years. Patient reports she has been drinking "every other day or two about two small containers of wine up to a 5th of vodka". Patient reports she has received treatment in the past from [facility] about 5 years ago. Patient reports she has also been to multiple facilities in [place], [place] over the past couple of years. Patient reports she has been seeking residential placement for herself and reports she followed up with [facility] who reports they will have a bed available on January 29, 2023. Patient reports an interest in being referred to the local facilities. Patient reports she is already utilizing outpatient resources with [facility] for medication management, and counseling through a private practice agency. Patient could not recall therapist at this time. Patient reports she would like to seek additional support for herself and would like to LCSW to start with [facility] and [facility] for placement. Patient aware that LCSW will send referrals out for review and will follow up to provide updates as received. Patient expressed understanding and appreciation of LCSW assistance. No other needs were reported at this time by patient.   LCSW attempted to contact [facility] [phone] to confirm bed availability, however was instructed to call back on tomorrow once Program Director Dalene Seltzer was back in the office.   Lucius Conn, LCSW Clinical Social Worker [facility] Ph: [phone]

## 2023-01-25 DIAGNOSIS — F1090 Alcohol use, unspecified, uncomplicated: Secondary | ICD-10-CM | POA: Diagnosis not present

## 2023-01-25 NOTE — ED Notes (Signed)
Pt sitting in dayroom calm and cooperative. No c/o pain or distress. Alert and orient x4. Will continue to monitor for safety

## 2023-01-25 NOTE — ED Notes (Signed)
Pt sleeping@this time. Breathing even and unlabored. Will continue to monitor for safety 

## 2023-01-25 NOTE — ED Notes (Signed)
Patient A&Ox4.Patient denies SI/Hi and AVH. Patient reports depression and mild anxiety. Patient reports she feels better than she did on yesterday. Patient denies any physical complaints when asked. No acute distress noted. Support and encouragement provided. Routine safety checks conducted according to facility protocol. Encouraged patient to notify staff if thoughts of harm toward self or others arise. Patient verbalize understanding and agreement. Will continue to monitor for safety.

## 2023-01-25 NOTE — Discharge Instructions (Addendum)
[facility] [place], [place], [address] [phone] phone  New Patient Assessment/Therapy Walk-Ins:  Monday and Wednesday: 8 am until slots are full. Every 1st and 2nd Fridays of the month: 1 pm - 5 pm.  NO ASSESSMENT/THERAPY WALK-INS ON [place]  New Patient Assessment/Medication Management Walk-Ins:  Monday - Friday:  8 am - 11 am.  For all walk-ins, we ask that you arrive by 7:30 am because patients will be seen in the order of arrival.  Availability is limited; therefore, you may not be seen on the same day that you walk-in.  Our goal is to serve and meet the needs of our community to the best of our ability.  SUBSTANCE USE TREATMENT for Medicaid and State Funded/IPRS  [facility] [place], [place], [address] [phone] phone NOTE: [facility] is no longer offering IOP services.  Serves those who are low-income or have no insurance.  [facility] [address] [place], [place], [address] [phone] phone [phone] fax NOTE: Does have Substance Abuse-Intensive Outpatient Program [facility]) as well as transitional housing if eligible.  [facility] [place], [place], [address] [phone] phone [phone] fax  [facility] [phone] [place], [place], [address] [phone] phone [phone] fax  HALFWAY HOUSES:  [facility] [place] [phone]  [employer].oxfordvacancies.com  12 STEP PROGRAMS:  Alcoholics Anonymous of [place] ReportZoo.com.cy  Narcotics Anonymous of [place] GreenScrapbooking.dk  Al-Anon of [employer], [place] www.[place]alan[place].org/find-meetings.html  Nar-Anon https://nar-anon.org/find-a-meetin   [place], [address]

## 2023-01-25 NOTE — Discharge Planning (Signed)
LCSW received phone call from [facility] in [place]. Per Admissions Coordinator, patient has been declined from their facility due to agency's recommendation for a higher level of care. Patient made aware of decision and reports she was accepted to [facility] for Monday. Patient aware that LCSW will attempt to follow up to confirm. Patient also provided permission for LCSW to speak to her daughter Belenda Cruise regarding plans to discharge this weekend.   LCSW attempted to get in contact with [facility] to confirm acceptance, however received no answer.   LCSW contacted the patient's daughter Tanija Kerst [phone] regarding disposition plan. Belenda Cruise also called the patient's other daughter Krystalle Handrick for a three way call. Per Belenda Cruise, the plan is for the family to pick up the patient on Saturday 03/09 between 11:30am-12:15pm to spend the weekend with the family before going into treatment. Per Belenda Cruise, no one else is able to pick up the patient on Saturday and asked for this to be documented in order to avoid the risk of the plan being ineffective. Daughters aware that they will likely be asked to present photo ID at entrance and they expressed understanding. Family reports they will take the patient to pack her items and then she will spend the weekend with them for one of the daughter's birthday on Sunday, and then family will transport her to [facility] between 10am-2:00pm. Family has been sent intake paperwork to complete by [facility], and no other needs were requested at this time.   MD and staff to be made aware of plan. LCSW will continue to follow and provide support to patient while on [facility] unit.   Lucius Conn, LCSW Clinical Social Worker [facility] Ph: [phone]

## 2023-01-25 NOTE — ED Notes (Signed)
Patient participated in RN led group topic was Suicide Safety Plan. Patient actively participated.

## 2023-01-25 NOTE — ED Notes (Signed)
Patient in dayroom watching TV. Respirations equal and unlabored, skin warm and dry, NAD. No change in assessment or acuity. Q 15 minute safety checks remain in place.

## 2023-01-25 NOTE — Discharge Planning (Signed)
Referral has been sent to [facility] for review. LCSW also attempted to get in contact with Admissions Director Billie at [facility] [phone], however line was busy. LCSW was unable to leave voice message. LCSW will continue to follow up for updates.   Lucius Conn, LCSW Clinical Social Worker [facility] Ph: [phone]

## 2023-01-25 NOTE — Group Note (Signed)
Group Topic: Communication  Group Date: 01/25/2023 Start Time: 1000 End Time: 1025 Facilitators: Ferdinand Cava  Department: [facility]  Number of Participants: 5  Group Focus: social skills Treatment Modality:  Psychoeducation and Skills Training Interventions utilized were clarification and group exercise Purpose: improve communication skills  Name: Debra Rhodes Date of Birth: 29-Mar-1961  MR: [id number]    Level of Participation: active Quality of Participation: attentive, cooperative, and engaged Interactions with others: gave feedback Mood/Affect: appropriate Triggers (if applicable): N/A Cognition: coherent/clear and logical Progress: Moderate Response: N/A Plan: follow-up needed  Patients Problems:  Patient Active Problem List   Diagnosis Date Noted   Alcohol use disorder 01/24/2023   Alcohol abuse with intoxication ([place]) 01/23/2023   Postmenopausal hormone therapy 09/28/2022   Bipolar 1 disorder, depressed, moderate ([place]) 06/28/2022   Overdose 06/23/2022   Acute respiratory failure ([place])    Acute encephalopathy    Chronic alcoholism in remission ([place]) 02/22/2022   Other specified behavioral and emotional disorders with onset usually occurring in childhood and adolescence 02/22/2022   Primary insomnia 02/22/2022   Raynaud's disease 02/22/2022   Acute non-recurrent frontal sinusitis 12/22/2021   Neoplasm of uncertain behavior of skin 11/01/2021   Generalized anxiety disorder 10/27/2021   Osteoarthritis of left knee 10/23/2021   Osteoarthrosis of hand 10/23/2021   Iron deficiency anemia 09/27/2021   Mixed hyperlipidemia 09/27/2021   Nasal congestion 08/16/2021   Suicidal ideation    Depression    MDD (major depressive disorder), recurrent severe, without psychosis ([place]) 01/21/2021   Deviated septum 06/30/2019   Mixed conductive and sensorineural hearing loss of both ears 06/30/2019   Nasal turbinate hypertrophy 06/30/2019    Rhinitis, chronic 06/30/2019   Tinnitus aurium, bilateral 06/30/2019   S/P right TKA 05/13/2019   Synovial cyst of knee 02/10/2019

## 2023-01-25 NOTE — ED Provider Notes (Signed)
Behavioral Health Progress Note  Date and Time: 01/25/2023 1:45 PM Name: Debra Rhodes MRN:  [id number]  Subjective:   On assessment 3/7, the patient reports that her motivation to quit drinking is feeling lonely and her desire to have a positive legacy.  She states that she wants to go to [facility] where she states that she has been admitted for residential rehab.  LCSW to verify.  The patient states that she wants to discharge on Saturday, well before her admission date of Monday, so that she can attend one of her daughters birthday party.  She states that her daughters are in agreement with the plan.  LCSW to verify.  Information obtained on initial evaluation: The patient is a 62 year old female with a history of alcohol use disorder and major depression, several previous psychiatric admissions, most recently in August after a large overdose that resulted in intubation and ICU stay.  She has been to residential rehab many times but has had difficulty with sobriety.  The patient presented to the emergency department when her daughter called EMS due to concern that she had consumed too much alcohol.  While there the patient was put under involuntary commitment by the emergency department physician due to her level of intoxication, recent serious suicide attempt, and having a large amount of prescription medication in her purse.  Nursing staff communicated handoff that the patient reported suicidal thoughts.  She is documented as denying suicidal thoughts to the emergency department physician and the psychiatric provider.  The patient was transferred to the facility based crisis.   On assessment 3/6, the patient exhibits depressed affect but has a relatively linear and logical thought process.  She reports that she has 3 daughters living in [place] and that she moved to [place] 7 years ago to go to [facility] for residential rehab.  She states that afterwards she did well in AA.  Unfortunately  she says her relationship with her boyfriend let her back into drinking and for the past 3 years she has been consuming alcohol heavily.  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  She states that she is drinking 2-3 bottles of wine per day.  She states that she is experiencing mild withdrawal symptoms of anxiety and shakiness.  Her last drink was the afternoon of 3/5.   The patient endorses some depressive symptoms but says that she has not had suicidal thoughts recently.  Discussed with her that the IVC will remain in place given the safety concerns that the emergency department physician had. Total Time spent with patient: 20 minutes  Past Psychiatric History: as above Past Medical History: as above Family History: none Family Psychiatric  History: none Social History: as above and per H and P  Additional Social History:  See H and P                  Sleep: Fair  Appetite:  Fair   Current Medications:  Current Facility-Administered Medications  Medication Dose Route Frequency Provider Last Rate Last Admin   acetaminophen (TYLENOL) tablet 650 mg  650 mg Oral Q6H PRN Corky Sox, MD       alum & mag hydroxide-simeth (MAALOX/MYLANTA) 200-200-20 MG/5ML suspension 30 mL  30 mL Oral Q4H PRN Corky Sox, MD       chlordiazePOXIDE (LIBRIUM) capsule 25 mg  25 mg Oral TID Corky Sox, MD   25 mg at 01/25/23 [phone]  Followed by   Derrill Memo ON 01/26/2023] chlordiazePOXIDE (LIBRIUM) capsule 25 mg  25 mg Oral Leonard Schwartz, MD       Followed by   Derrill Memo ON 01/27/2023] chlordiazePOXIDE (LIBRIUM) capsule 25 mg  25 mg Oral Daily Corky Sox, MD       diclofenac Sodium (VOLTAREN) 1 % topical gel 2 g  2 g Topical TID Corky Sox, MD   2 g at 01/25/23 [id number]   gabapentin (NEURONTIN) capsule 100 mg  100 mg Oral TID Corky Sox, MD   100 mg at 01/25/23 [id number]   hydrOXYzine (ATARAX) tablet 10 mg  10 mg Oral TID PRN  Corky Sox, MD   10 mg at 01/24/23 1034   lamoTRIgine (LAMICTAL) tablet 100 mg  100 mg Oral QHS Corky Sox, MD   100 mg at 01/24/23 2116   loperamide (IMODIUM) capsule 2-4 mg  2-4 mg Oral PRN Corky Sox, MD       LORazepam (ATIVAN) injection 2 mg  2 mg Intramuscular Once PRN Corky Sox, MD       LORazepam (ATIVAN) tablet 1 mg  1 mg Oral Q6H PRN Corky Sox, MD       magnesium hydroxide (MILK OF MAGNESIA) suspension 30 mL  30 mL Oral Daily PRN Corky Sox, MD       multivitamin with minerals tablet 1 tablet  1 tablet Oral Daily Corky Sox, MD   1 tablet at 01/25/23 [id number]   nicotine (NICODERM CQ - dosed in mg/24 hours) patch 21 mg  21 mg Transdermal Daily PRN Corky Sox, MD       ondansetron (ZOFRAN-ODT) disintegrating tablet 4 mg  4 mg Oral Q6H PRN Corky Sox, MD       PARoxetine (PAXIL) tablet 20 mg  20 mg Oral Daily Corky Sox, MD   20 mg at 01/25/23 0915   thiamine (VITAMIN B1) tablet 100 mg  100 mg Oral Daily Corky Sox, MD   100 mg at 01/25/23 0915   traZODone (DESYREL) tablet 50 mg  50 mg Oral QHS Corky Sox, MD   50 mg at 01/24/23 2116   Current Outpatient Medications  Medication Sig Dispense Refill   CELEBREX 100 MG capsule Take 100 mg by mouth 2 (two) times daily.     cetirizine (ZYRTEC) 10 MG tablet Take 10 mg by mouth daily.     estradiol (ESTRACE) 0.5 MG tablet Take 1 tablet (0.5 mg total) by mouth daily. (Patient not taking: Reported on 01/24/2023) 90 tablet 1   gabapentin (NEURONTIN) 100 MG capsule Take 1 capsule (100 mg total) by mouth 3 (three) times daily as needed. 180 capsule 1   lamoTRIgine (LAMICTAL) 100 MG tablet Take 1 tablet (100 mg total) by mouth daily. 90 tablet 1   Multiple Vitamin (MULTIVITAMIN) tablet Take 1 tablet by mouth daily.     PARoxetine (PAXIL) 20 MG tablet Take 1 tablet (20 mg total) by mouth daily. (Patient not taking: Reported on 01/24/2023) 90 tablet 1   progesterone (PROMETRIUM) 100 MG capsule Take 1  capsule (100 mg total) by mouth at bedtime. (Patient not taking: Reported on 01/24/2023) 90 capsule 1   sulfamethoxazole-trimethoprim (BACTRIM DS) 800-160 MG tablet Take 1 tablet by mouth 2 (two) times daily after a meal. (Patient not taking: Reported on 01/09/2023) 6 tablet 0   traZODone (DESYREL) 50 MG tablet Take 1-2 tabs at bedtime as needed for insomnia (Patient not taking: Reported on 01/24/2023) 180 tablet 1    Labs  Lab Results:  Admission on 01/24/2023  Component Date Value Ref Range Status   Color, Urine 01/24/2023 YELLOW  YELLOW Final   APPearance 01/24/2023 CLEAR  CLEAR Final   Specific Gravity, Urine 01/24/2023 1.006  1.005 - 1.030 Final   pH 01/24/2023 7.0  5.0 - 8.0 Final   Glucose, UA 01/24/2023 NEGATIVE  NEGATIVE mg/dL Final   Hgb urine dipstick 01/24/2023 SMALL (A)  NEGATIVE Final   Bilirubin Urine 01/24/2023 NEGATIVE  NEGATIVE Final   Ketones, ur 01/24/2023 NEGATIVE  NEGATIVE mg/dL Final   Protein, ur 01/24/2023 NEGATIVE  NEGATIVE mg/dL Final   Nitrite 01/24/2023 NEGATIVE  NEGATIVE Final   Leukocytes,Ua 01/24/2023 NEGATIVE  NEGATIVE Final   RBC / HPF 01/24/2023 0-5  0 - 5 RBC/hpf Final   WBC, UA 01/24/2023 0-5  0 - 5 WBC/hpf Final   Bacteria, UA 01/24/2023 RARE (A)  NONE SEEN Final   Squamous Epithelial / HPF 01/24/2023 6-10  0 - 5 /HPF Final   Performed at [facility], [place] [address], [place], [place] [address]  Admission on 01/23/2023, Discharged on 01/24/2023  Component Date Value Ref Range Status   Sodium 01/23/2023 138  135 - 145 mmol/L Final   Potassium 01/23/2023 3.8  3.5 - 5.1 mmol/L Final   Chloride 01/23/2023 104  98 - 111 mmol/L Final   CO2 01/23/2023 24  22 - 32 mmol/L Final   Glucose, Bld 01/23/2023 99  70 - 99 mg/dL Final   Glucose reference range applies only to samples taken after fasting for at least 8 hours.   BUN 01/23/2023 6 (L)  8 - 23 mg/dL Final   Creatinine, Ser 01/23/2023 0.70  0.44 - 1.00 mg/dL Final   Calcium 01/23/2023 8.4 (L)  8.9 -  10.3 mg/dL Final   Total Protein 01/23/2023 7.0  6.5 - 8.1 g/dL Final   Albumin 01/23/2023 4.1  3.5 - 5.0 g/dL Final   AST 01/23/2023 23  15 - 41 U/L Final   ALT 01/23/2023 13  0 - 44 U/L Final   Alkaline Phosphatase 01/23/2023 39  38 - 126 U/L Final   Total Bilirubin 01/23/2023 0.5  0.3 - 1.2 mg/dL Final   GFR, Estimated 01/23/2023 >60  >60 mL/min Final   Comment: (NOTE) Calculated using the CKD-EPI Creatinine Equation (2021)    Anion gap 01/23/2023 10  5 - 15 Final   Comment: ELECTROLYTES REPEATED TO VERIFY Performed at [facility], [place] [address], [place], [place] [address]    Alcohol, Ethyl (B) 01/23/2023 375 (HH)  <10 mg/dL Final   Comment: CRITICAL RESULT CALLED TO, READ BACK BY AND VERIFIED WITH B,D RN AT 2158 01/23/23 BY [email or web address] (NOTE) Lowest detectable limit for serum alcohol is 10 mg/dL.  For medical purposes only. Performed at [facility], [place] [address], [place],  [address]    Opiates 01/23/2023 NONE DETECTED  NONE DETECTED Final   Cocaine 01/23/2023 NONE DETECTED  NONE DETECTED Final   Benzodiazepines 01/23/2023 NONE DETECTED  NONE DETECTED Final   Amphetamines 01/23/2023 NONE DETECTED  NONE DETECTED Final   Tetrahydrocannabinol 01/23/2023 NONE DETECTED  NONE DETECTED Final   Barbiturates 01/23/2023 NONE DETECTED  NONE DETECTED Final   Comment: (NOTE) DRUG SCREEN FOR MEDICAL PURPOSES ONLY.  IF CONFIRMATION IS NEEDED FOR ANY PURPOSE, NOTIFY LAB WITHIN 5 DAYS.  LOWEST DETECTABLE LIMITS FOR URINE DRUG SCREEN Drug Class  Cutoff (ng/mL) Amphetamine and metabolites    1000 Barbiturate and metabolites    200 Benzodiazepine                 200 Opiates and metabolites        300 Cocaine and metabolites        300 THC                            50 Performed at [facility], [place] [address] [place], [place] [address]    WBC 01/23/2023 4.2  4.0 - 10.5 K/uL Final   RBC 01/23/2023 4.10   3.87 - 5.11 MIL/uL Final   Hemoglobin 01/23/2023 13.3  12.0 - 15.0 g/dL Final   HCT 01/23/2023 39.5  36.0 - 46.0 % Final   MCV 01/23/2023 96.3  80.0 - 100.0 fL Final   MCH 01/23/2023 32.4  26.0 - 34.0 pg Final   MCHC 01/23/2023 33.7  30.0 - 36.0 g/dL Final   RDW 01/23/2023 13.9  11.5 - 15.5 % Final   Platelets 01/23/2023 262  150 - 400 K/uL Final   nRBC 01/23/2023 0.0  0.0 - 0.2 % Final   Neutrophils Relative % 01/23/2023 41  % Final   Neutro Abs 01/23/2023 1.8  1.7 - 7.7 K/uL Final   Lymphocytes Relative 01/23/2023 48  % Final   Lymphs Abs 01/23/2023 2.0  0.7 - 4.0 K/uL Final   Monocytes Relative 01/23/2023 8  % Final   Monocytes Absolute 01/23/2023 0.3  0.1 - 1.0 K/uL Final   Eosinophils Relative 01/23/2023 2  % Final   Eosinophils Absolute 01/23/2023 0.1  0.0 - 0.5 K/uL Final   Basophils Relative 01/23/2023 1  % Final   Basophils Absolute 01/23/2023 0.1  0.0 - 0.1 K/uL Final   Immature Granulocytes 01/23/2023 0  % Final   Abs Immature Granulocytes 01/23/2023 0.01  0.00 - 0.07 K/uL Final   Performed at [facility], [place] [address], [place], [place] [id number]   Salicylate Lvl [id number] <7.0 (L)  7.0 - 30.0 mg/dL Final   Performed at [place] [address] [place], [place] [address]   Acetaminophen (Tylenol), Serum 01/23/2023 <10 (L)  10 - 30 ug/mL Final   Comment: (NOTE) Therapeutic concentrations vary significantly. A range of 10-30 ug/mL  may be an effective concentration for many patients. However, some  are best treated at concentrations outside of this range. Acetaminophen concentrations >150 ug/mL at 4 hours after ingestion  and >50 ug/mL at 12 hours after ingestion are often associated with  toxic reactions.  Performed at [facility], [place] [address], [place], [place] [address]    SARS Coronavirus 2 by RT PCR 01/23/2023 NEGATIVE  NEGATIVE Final   Comment: (NOTE) SARS-CoV-2 target nucleic acids are NOT  DETECTED.  The SARS-CoV-2 RNA is generally detectable in upper respiratory specimens during the acute phase of infection. The lowest concentration of SARS-CoV-2 viral copies this assay can detect is 138 copies/mL. A negative result does not preclude SARS-Cov-2 infection and should not be used as the sole basis for treatment or other patient management decisions. A negative result may occur with  improper specimen collection/handling, submission of specimen other than nasopharyngeal swab, presence of viral mutation(s) within the areas targeted by this assay, and inadequate number of viral copies(<138 copies/mL). A negative result must be combined with clinical observations, patient history, and epidemiological information. The expected  result is Negative.  Fact Sheet for Patients:  EntrepreneurPulse.com.au  Fact Sheet for Healthcare Providers:  IncredibleEmployment.be  This test is no                          t yet approved or cleared by the [place] FDA and  has been authorized for detection and/or diagnosis of SARS-CoV-2 by FDA under an Emergency Use Authorization (EUA). This EUA will remain  in effect (meaning this test can be used) for the duration of the COVID-19 declaration under Section 564(b)(1) of the Act, 21 U.S.C.section 360bbb-3(b)(1), unless the authorization is terminated  or revoked sooner.       Influenza A by PCR 01/23/2023 NEGATIVE  NEGATIVE Final   Influenza B by PCR 01/23/2023 NEGATIVE  NEGATIVE Final   Comment: (NOTE) The Xpert Xpress SARS-CoV-2/FLU/RSV plus assay is intended as an aid in the diagnosis of influenza from Nasopharyngeal swab specimens and should not be used as a sole basis for treatment. Nasal washings and aspirates are unacceptable for Xpert Xpress SARS-CoV-2/FLU/RSV testing.  Fact Sheet for Patients: EntrepreneurPulse.com.au  Fact Sheet for Healthcare  Providers: IncredibleEmployment.be  This test is not yet approved or cleared by the [place] FDA and has been authorized for detection and/or diagnosis of SARS-CoV-2 by FDA under an Emergency Use Authorization (EUA). This EUA will remain in effect (meaning this test can be used) for the duration of the COVID-19 declaration under Section 564(b)(1) of the Act, 21 U.S.C. section 360bbb-3(b)(1), unless the authorization is terminated or revoked.     Resp Syncytial Virus by PCR 01/23/2023 NEGATIVE  NEGATIVE Final   Comment: (NOTE) Fact Sheet for Patients: EntrepreneurPulse.com.au  Fact Sheet for Healthcare Providers: IncredibleEmployment.be  This test is not yet approved or cleared by the [place] FDA and has been authorized for detection and/or diagnosis of SARS-CoV-2 by FDA under an Emergency Use Authorization (EUA). This EUA will remain in effect (meaning this test can be used) for the duration of the COVID-19 declaration under Section 564(b)(1) of the Act, 21 U.S.C. section 360bbb-3(b)(1), unless the authorization is terminated or revoked.  Performed at [facility], [place] [address] [place], [place] [address]   Office Visit on 01/05/2023  Component Date Value Ref Range Status   Color, UA 01/05/2023 Orange   Final   Glucose, UA 01/05/2023 Negative  Negative Final   Bilirubin, UA 01/05/2023 Negative   Final   Ketones, UA 01/05/2023 Negative   Final   Spec Grav, UA 01/05/2023 1.020  1.010 - 1.025 Final   Blood, UA 01/05/2023 Moderate (A)   Final   pH, UA 01/05/2023 6.0  5.0 - 8.0 Final   Protein, UA 01/05/2023 Negative  Negative Final   Urobilinogen, UA 01/05/2023 1.0  0.2 or 1.0 E.U./dL Final   Nitrite, UA 01/05/2023 Positive (A)   Final   Leukocytes, UA 01/05/2023 Small (1+) (A)  Negative Final  Abstract on 09/28/2022  Component Date Value Ref Range Status   Cologuard 09/29/2021 Negative   Negative Final   [facility] Colonoscopy 09/29/2021 See Report (in chart)  See Report (in chart), Patient Reported Final    Blood Alcohol level:  Lab Results  Component Value Date   ETH 375 (HH) 01/23/2023   ETH 233 (H) 123XX123    Metabolic Disorder Labs: Lab Results  Component Value Date   HGBA1C 5.0 06/30/2022   MPG 96.8 06/30/2022   MPG 105.41 10/27/2021   No results found for: "  PROLACTIN" Lab Results  Component Value Date   CHOL 264 (H) 06/30/2022   TRIG 63 06/30/2022   HDL 101 06/30/2022   CHOLHDL 2.6 06/30/2022   VLDL 13 06/30/2022   LDLCALC 150 (H) 06/30/2022   LDLCALC 175 (H) 10/27/2021    Therapeutic Lab Levels: No results found for: "LITHIUM" No results found for: "VALPROATE" No results found for: "CBMZ"  Physical Findings   [facility] Office Visit from 07/31/2022 in [facility] Office Visit from 02/17/2022 in [facility] Admission (Discharged) from 10/26/2021 in [facility] Admission (Discharged) from 01/18/2021 in [facility]  AIMS Total Score 0 1 0 0      AUDIT    Flowsheet Row Admission (Discharged) from 06/28/2022 in [facility] Admission (Discharged) from 10/26/2021 in [facility] Admission (Discharged) from 07/08/2021 in [facility] Admission (Discharged) from 01/18/2021 in [facility]  Alcohol Use Disorder Identification Test Final Score (AUDIT) '22 6 7 25      '$ PHQ2-9    Flowsheet Row ED from 01/24/2023 in [facility] Office Visit from 02/22/2022 in [facility] ED from 10/26/2021 in [facility] Emergency Department at [facility] Total Score 2 0 4  PHQ-9 Total Score 8 0 13      Flowsheet Row ED from 01/24/2023 in [facility] ED from 01/23/2023 in [facility] Emergency Department at [facility] ED from 12/01/2022 in [facility] Emergency Department at [place] No Risk No Risk No Risk      Musculoskeletal  Strength & Muscle Tone: within normal limits Gait & Station: normal Patient leans: N/A  Psychiatric Specialty Exam  Presentation General Appearance: Appropriate for Environment  Eye Contact:Fair  Speech:Clear and Coherent  Speech Volume:Normal  Handedness:-- (not assessed)   Mood and Affect  Mood:Euthymic  Affect:Congruent   Thought Process  Thought Processes:Coherent; Linear  Descriptions of Associations:Intact  Orientation:Full (Time, Place and Person)  Thought Content:Logical    Hallucinations:Hallucinations: None  Ideas of Reference:None  Suicidal Thoughts:Suicidal Thoughts: No  Homicidal Thoughts:Homicidal Thoughts: No   Sensorium  Memory:Immediate Fair; Recent Fair; Remote Fair  Judgment:Fair  Insight:Fair   Executive Functions  Concentration:Fair  Attention Span:Fair  [place]   Psychomotor Activity  Psychomotor Activity:Psychomotor Activity: Normal   Assets  Assets:Communication Skills; Resilience   Sleep  Sleep:Sleep: Fair   Nutritional Assessment (For OBS and FBC admissions only) Has the patient had a weight loss or gain of 10 pounds or more in the last 3 months?: No Has the patient had a decrease in food intake/or appetite?: Yes Does the patient have dental problems?: No Does the patient have eating habits or behaviors that may be indicators of an eating disorder including binging or inducing vomiting?: No Has the patient recently lost weight without trying?: 0 Has the patient been eating poorly because of a decreased appetite?: 0 Malnutrition Screening Tool Score: 0    Physical Exam Constitutional:      Appearance: the patient is  not toxic-appearing.  Pulmonary:     Effort: Pulmonary effort is normal.  Neurological:     General: No focal deficit present.     Mental Status: the patient is alert and oriented to person, place, and time.  Review of Systems  Respiratory:  Negative for shortness of breath.   Cardiovascular:  Negative for chest pain.  Gastrointestinal:  Negative for abdominal pain, constipation, diarrhea, nausea and vomiting.  Neurological:  Negative for headaches.    BP 98/71   Pulse 89   Temp 98.4 F (36.9 C) (Oral)   Resp 16   SpO2 99%   Assessment and Plan:  Status: Involuntary, 24-hour facility exam paperwork filled out by me   Alcohol use disorder, reported history of alcohol withdrawal seizures, no documented history of alcohol withdrawal seizures, (her described symptomatology is not consistent with seizure based on my interview) - Librium detox, CIWA with as needed Ativan, as needed Ativan injection for seizure   Continue patient's home prescriptions for generalized anxiety disorder - Paxil 20 mg daily - Gabapentin 100 mg 3 times daily - Trazodone 50 mg nightly for insomnia - Lamotrigine 100 mg nightly (patient reports consistent adherence)   Medical: - Lab work unremarkable - UDS negative, BAL 375 - Voltaren gel in place of patient's reported Celebrex (she reports uncertainty as to whether she is taking this medication or a different 1)     Corky Sox, MD 01/25/2023 1:45 PM

## 2023-01-25 NOTE — ED Notes (Signed)
Patient is sleeping. Respirations equal and unlabored, skin warm and dry, NAD. No change in assessment or acuity. Routine safety checks conducted according to facility protocol. Will continue to monitor for safety.

## 2023-01-26 DIAGNOSIS — F1090 Alcohol use, unspecified, uncomplicated: Secondary | ICD-10-CM | POA: Diagnosis not present

## 2023-01-26 MED ORDER — IBUPROFEN 600 MG PO TABS
600.0000 mg | ORAL_TABLET | Freq: Two times a day (BID) | ORAL | Status: DC | PRN
  Administered 2023-01-26 – 2023-01-27 (×2): 600 mg via ORAL
  Filled 2023-01-26 (×2): qty 1

## 2023-01-26 NOTE — ED Notes (Signed)
Pt sleeping@this time. Breathing even and unlabored. Will continue to monitor for safety 

## 2023-01-26 NOTE — Progress Notes (Signed)
Pt went to the courtyard for fresh air.

## 2023-01-26 NOTE — ED Notes (Signed)
Pt is attending AA 

## 2023-01-26 NOTE — ED Notes (Signed)
Pt sleeping in bed. RR even and unlabored. Will continue to monitor for safety

## 2023-01-26 NOTE — Group Note (Signed)
Group Topic: Relapse and Recovery  Group Date: 01/26/2023 Start Time: 1000 End Time: [place] Facilitators: Hetty Blend, RN  Department: [facility]  Number of Participants: 5  Group Focus: clarity of thought, communication, family, and relapse prevention Treatment Modality:  Behavior Modification Therapy, Cognitive Behavioral Therapy, and Individual Therapy Interventions utilized were group exercise, mental fitness, patient education, and support Purpose: enhance coping skills, express feelings, improve communication skills, regain self-worth, reinforce self-care, relapse prevention strategies, and trigger / craving management  Name: Debra Rhodes Date of Birth: August 21, 1961  MR: [id number]    Level of Participation: active Quality of Participation: attentive and cooperative Interactions with others: gave feedback Mood/Affect: appropriate Triggers (if applicable): n/a Cognition: coherent/clear Progress: Moderate Response: n/a Plan: follow-up needed  Patients Problems:  Patient Active Problem List   Diagnosis Date Noted   Alcohol use disorder 01/24/2023   Alcohol abuse with intoxication ([place]) 01/23/2023   Postmenopausal hormone therapy 09/28/2022   Bipolar 1 disorder, depressed, moderate ([place]) 06/28/2022   Overdose 06/23/2022   Acute respiratory failure ([place])    Acute encephalopathy    Chronic alcoholism in remission ([place]) 02/22/2022   Other specified behavioral and emotional disorders with onset usually occurring in childhood and adolescence 02/22/2022   Primary insomnia 02/22/2022   Raynaud's disease 02/22/2022   Acute non-recurrent frontal sinusitis 12/22/2021   Neoplasm of uncertain behavior of skin 11/01/2021   Generalized anxiety disorder 10/27/2021   Osteoarthritis of left knee 10/23/2021   Osteoarthrosis of hand 10/23/2021   Iron deficiency anemia 09/27/2021   Mixed hyperlipidemia 09/27/2021   Nasal congestion  08/16/2021   Suicidal ideation    Depression    MDD (major depressive disorder), recurrent severe, without psychosis ([place]) 01/21/2021   Deviated septum 06/30/2019   Mixed conductive and sensorineural hearing loss of both ears 06/30/2019   Nasal turbinate hypertrophy 06/30/2019   Rhinitis, chronic 06/30/2019   Tinnitus aurium, bilateral 06/30/2019   S/P right TKA 05/13/2019   Synovial cyst of knee 02/10/2019

## 2023-01-26 NOTE — Progress Notes (Signed)
Pt's CIWA was 1 

## 2023-01-26 NOTE — Progress Notes (Signed)
Pt had lunch and is currently watching TV. No distress noted or concerns voiced. Staff will monitor for pt's safety.

## 2023-01-26 NOTE — ED Notes (Signed)
Snacks given 

## 2023-01-26 NOTE — ED Provider Notes (Signed)
Behavioral Health Progress Note  Date and Time: 01/26/2023 4:14 PM Name: Debra Rhodes MRN:  [id number]  Subjective:   On assessment 3/8, the patient exhibits appropriate affect and logical thought process.  She states that her daughter will pick her up tomorrow at 11 AM.  This has been confirmed by LCSW.  Patient denies experiencing depression or suicidal thoughts.  She reports that she plans to abstain from alcohol and go to [facility] on Monday.  On assessment 3/7, the patient reports that her motivation to quit drinking is feeling lonely and her desire to have a positive legacy.  She states that she wants to go to [facility] where she states that she has been admitted for residential rehab.  LCSW to verify.  The patient states that she wants to discharge on Saturday, well before her admission date of Monday, so that she can attend one of her daughters birthday party.  She states that her daughters are in agreement with the plan.  LCSW to verify.  Information obtained on initial evaluation: The patient is a 62 year old female with a history of alcohol use disorder and major depression, several previous psychiatric admissions, most recently in August after a large overdose that resulted in intubation and ICU stay.  She has been to residential rehab many times but has had difficulty with sobriety.  The patient presented to the emergency department when her daughter called EMS due to concern that she had consumed too much alcohol.  While there the patient was put under involuntary commitment by the emergency department physician due to her level of intoxication, recent serious suicide attempt, and having a large amount of prescription medication in her purse.  Nursing staff communicated handoff that the patient reported suicidal thoughts.  She is documented as denying suicidal thoughts to the emergency department physician and the psychiatric provider.  The patient was transferred to the facility based  crisis.   On assessment 3/6, the patient exhibits depressed affect but has a relatively linear and logical thought process.  She reports that she has 3 daughters living in [place] and that she moved to [place] 7 years ago to go to [facility] for residential rehab.  She states that afterwards she did well in AA.  Unfortunately she says her relationship with her boyfriend let her back into drinking and for the past 3 years she has been consuming alcohol heavily.  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  She states that she is drinking 2-3 bottles of wine per day.  She states that she is experiencing mild withdrawal symptoms of anxiety and shakiness.  Her last drink was the afternoon of 3/5.   The patient endorses some depressive symptoms but says that she has not had suicidal thoughts recently.  Discussed with her that the IVC will remain in place given the safety concerns that the emergency department physician had. Total Time spent with patient: 20 minutes  Past Psychiatric History: as above Past Medical History: as above Family History: none Family Psychiatric  History: none Social History: as above and per H and P  Additional Social History:  See H and P                  Sleep: Fair  Appetite:  Fair   Current Medications:  Current Facility-Administered Medications  Medication Dose Route Frequency Provider Last Rate Last Admin   acetaminophen (TYLENOL) tablet 650 mg  650 mg  Oral Q6H PRN Corky Sox, MD       alum & mag hydroxide-simeth (MAALOX/MYLANTA) 200-200-20 MG/5ML suspension 30 mL  30 mL Oral Q4H PRN Corky Sox, MD       diclofenac Sodium (VOLTAREN) 1 % topical gel 2 g  2 g Topical TID Corky Sox, MD   2 g at 01/26/23 0855   gabapentin (NEURONTIN) capsule 100 mg  100 mg Oral TID Corky Sox, MD   100 mg at 01/26/23 1539   hydrOXYzine (ATARAX) tablet 10 mg  10 mg Oral TID PRN Corky Sox, MD    10 mg at 01/24/23 1034   ibuprofen (ADVIL) tablet 600 mg  600 mg Oral Q12H PRN Corky Sox, MD   600 mg at 01/26/23 1328   lamoTRIgine (LAMICTAL) tablet 100 mg  100 mg Oral QHS Corky Sox, MD   100 mg at 01/25/23 2103   loperamide (IMODIUM) capsule 2-4 mg  2-4 mg Oral PRN Corky Sox, MD       LORazepam (ATIVAN) injection 2 mg  2 mg Intramuscular Once PRN Corky Sox, MD       LORazepam (ATIVAN) tablet 1 mg  1 mg Oral Q6H PRN Corky Sox, MD       magnesium hydroxide (MILK OF MAGNESIA) suspension 30 mL  30 mL Oral Daily PRN Corky Sox, MD       multivitamin with minerals tablet 1 tablet  1 tablet Oral Daily Corky Sox, MD   1 tablet at 01/26/23 [id number]   nicotine (NICODERM CQ - dosed in mg/24 hours) patch 21 mg  21 mg Transdermal Daily PRN Corky Sox, MD       ondansetron (ZOFRAN-ODT) disintegrating tablet 4 mg  4 mg Oral Q6H PRN Corky Sox, MD       PARoxetine (PAXIL) tablet 20 mg  20 mg Oral Daily Corky Sox, MD   20 mg at 01/26/23 [id number]   thiamine (VITAMIN B1) tablet 100 mg  100 mg Oral Daily Corky Sox, MD   100 mg at 01/26/23 [id number]   traZODone (DESYREL) tablet 50 mg  50 mg Oral Sharmon Leyden, MD   50 mg at 01/25/23 2103   Current Outpatient Medications  Medication Sig Dispense Refill   CELEBREX 100 MG capsule Take 100 mg by mouth 2 (two) times daily.     cetirizine (ZYRTEC) 10 MG tablet Take 10 mg by mouth daily.     estradiol (ESTRACE) 0.5 MG tablet Take 1 tablet (0.5 mg total) by mouth daily. (Patient not taking: Reported on 01/24/2023) 90 tablet 1   gabapentin (NEURONTIN) 100 MG capsule Take 1 capsule (100 mg total) by mouth 3 (three) times daily as needed. 180 capsule 1   lamoTRIgine (LAMICTAL) 100 MG tablet Take 1 tablet (100 mg total) by mouth daily. 90 tablet 1   Multiple Vitamin (MULTIVITAMIN) tablet Take 1 tablet by mouth daily.     PARoxetine (PAXIL) 20 MG tablet Take 1 tablet (20 mg total) by mouth daily. (Patient not taking:  Reported on 01/24/2023) 90 tablet 1   progesterone (PROMETRIUM) 100 MG capsule Take 1 capsule (100 mg total) by mouth at bedtime. (Patient not taking: Reported on 01/24/2023) 90 capsule 1   sulfamethoxazole-trimethoprim (BACTRIM DS) 800-160 MG tablet Take 1 tablet by mouth 2 (two) times daily after a meal. (Patient not taking: Reported on 01/09/2023) 6 tablet 0   traZODone (DESYREL) 50 MG tablet Take 1-2 tabs at bedtime as needed for insomnia (Patient not taking: Reported on 01/24/2023) 180 tablet  1    Labs  Lab Results:  Admission on 01/24/2023  Component Date Value Ref Range Status   Color, Urine 01/24/2023 YELLOW  YELLOW Final   APPearance 01/24/2023 CLEAR  CLEAR Final   Specific Gravity, Urine 01/24/2023 1.006  1.005 - 1.030 Final   pH 01/24/2023 7.0  5.0 - 8.0 Final   Glucose, UA 01/24/2023 NEGATIVE  NEGATIVE mg/dL Final   Hgb urine dipstick 01/24/2023 SMALL (A)  NEGATIVE Final   Bilirubin Urine 01/24/2023 NEGATIVE  NEGATIVE Final   Ketones, ur 01/24/2023 NEGATIVE  NEGATIVE mg/dL Final   Protein, ur 01/24/2023 NEGATIVE  NEGATIVE mg/dL Final   Nitrite 01/24/2023 NEGATIVE  NEGATIVE Final   Leukocytes,Ua 01/24/2023 NEGATIVE  NEGATIVE Final   RBC / HPF 01/24/2023 0-5  0 - 5 RBC/hpf Final   WBC, UA 01/24/2023 0-5  0 - 5 WBC/hpf Final   Bacteria, UA 01/24/2023 RARE (A)  NONE SEEN Final   Squamous Epithelial / HPF 01/24/2023 6-10  0 - 5 /HPF Final   Performed at [facility], [place] [address], [place], [place] [address]  Admission on 01/23/2023, Discharged on 01/24/2023  Component Date Value Ref Range Status   Sodium 01/23/2023 138  135 - 145 mmol/L Final   Potassium 01/23/2023 3.8  3.5 - 5.1 mmol/L Final   Chloride 01/23/2023 104  98 - 111 mmol/L Final   CO2 01/23/2023 24  22 - 32 mmol/L Final   Glucose, Bld 01/23/2023 99  70 - 99 mg/dL Final   Glucose reference range applies only to samples taken after fasting for at least 8 hours.   BUN 01/23/2023 6 (L)  8 - 23 mg/dL Final    Creatinine, Ser 01/23/2023 0.70  0.44 - 1.00 mg/dL Final   Calcium 01/23/2023 8.4 (L)  8.9 - 10.3 mg/dL Final   Total Protein 01/23/2023 7.0  6.5 - 8.1 g/dL Final   Albumin 01/23/2023 4.1  3.5 - 5.0 g/dL Final   AST 01/23/2023 23  15 - 41 U/L Final   ALT 01/23/2023 13  0 - 44 U/L Final   Alkaline Phosphatase 01/23/2023 39  38 - 126 U/L Final   Total Bilirubin 01/23/2023 0.5  0.3 - 1.2 mg/dL Final   GFR, Estimated 01/23/2023 >60  >60 mL/min Final   Comment: (NOTE) Calculated using the CKD-EPI Creatinine Equation (2021)    Anion gap 01/23/2023 10  5 - 15 Final   Comment: ELECTROLYTES REPEATED TO VERIFY Performed at [facility], [place] [address] [place], [place] [address]    Alcohol, Ethyl (B) 01/23/2023 375 (HH)  <10 mg/dL Final   Comment: CRITICAL RESULT CALLED TO, READ BACK BY AND VERIFIED WITH B,D RN AT 2158 01/23/23 BY [email or web address] (NOTE) Lowest detectable limit for serum alcohol is 10 mg/dL.  For medical purposes only. Performed at [facility], [place] [address] [place], [place] [address]    Opiates 01/23/2023 NONE DETECTED  NONE DETECTED Final   Cocaine 01/23/2023 NONE DETECTED  NONE DETECTED Final   Benzodiazepines 01/23/2023 NONE DETECTED  NONE DETECTED Final   Amphetamines 01/23/2023 NONE DETECTED  NONE DETECTED Final   Tetrahydrocannabinol 01/23/2023 NONE DETECTED  NONE DETECTED Final   Barbiturates 01/23/2023 NONE DETECTED  NONE DETECTED Final   Comment: (NOTE) DRUG SCREEN FOR MEDICAL PURPOSES ONLY.  IF CONFIRMATION IS NEEDED FOR ANY PURPOSE, NOTIFY LAB WITHIN 5 DAYS.  LOWEST DETECTABLE LIMITS FOR URINE DRUG SCREEN Drug Class  Cutoff (ng/mL) Amphetamine and metabolites    1000 Barbiturate and metabolites    200 Benzodiazepine                 200 Opiates and metabolites        300 Cocaine and metabolites        300 THC                            50 Performed at [facility], [place] [address] [place], [place] [address]    WBC 01/23/2023 4.2  4.0 - 10.5 K/uL Final   RBC 01/23/2023 4.10  3.87 - 5.11 MIL/uL Final   Hemoglobin 01/23/2023 13.3  12.0 - 15.0 g/dL Final   HCT 01/23/2023 39.5  36.0 - 46.0 % Final   MCV 01/23/2023 96.3  80.0 - 100.0 fL Final   MCH 01/23/2023 32.4  26.0 - 34.0 pg Final   MCHC 01/23/2023 33.7  30.0 - 36.0 g/dL Final   RDW 01/23/2023 13.9  11.5 - 15.5 % Final   Platelets 01/23/2023 262  150 - 400 K/uL Final   nRBC 01/23/2023 0.0  0.0 - 0.2 % Final   Neutrophils Relative % 01/23/2023 41  % Final   Neutro Abs 01/23/2023 1.8  1.7 - 7.7 K/uL Final   Lymphocytes Relative 01/23/2023 48  % Final   Lymphs Abs 01/23/2023 2.0  0.7 - 4.0 K/uL Final   Monocytes Relative 01/23/2023 8  % Final   Monocytes Absolute 01/23/2023 0.3  0.1 - 1.0 K/uL Final   Eosinophils Relative 01/23/2023 2  % Final   Eosinophils Absolute 01/23/2023 0.1  0.0 - 0.5 K/uL Final   Basophils Relative 01/23/2023 1  % Final   Basophils Absolute 01/23/2023 0.1  0.0 - 0.1 K/uL Final   Immature Granulocytes 01/23/2023 0  % Final   Abs Immature Granulocytes 01/23/2023 0.01  0.00 - 0.07 K/uL Final   Performed at [facility], [place] [address], [place], [place] [id number]   Salicylate Lvl [id number] <7.0 (L)  7.0 - 30.0 mg/dL Final   Performed at [place] [address] [place], [place] [address]   Acetaminophen (Tylenol), Serum 01/23/2023 <10 (L)  10 - 30 ug/mL Final   Comment: (NOTE) Therapeutic concentrations vary significantly. A range of 10-30 ug/mL  may be an effective concentration for many patients. However, some  are best treated at concentrations outside of this range. Acetaminophen concentrations >150 ug/mL at 4 hours after ingestion  and >50 ug/mL at 12 hours after ingestion are often associated with  toxic reactions.  Performed at [facility], [place] [address], [place], [place] [address]    SARS Coronavirus 2 by RT PCR  01/23/2023 NEGATIVE  NEGATIVE Final   Comment: (NOTE) SARS-CoV-2 target nucleic acids are NOT DETECTED.  The SARS-CoV-2 RNA is generally detectable in upper respiratory specimens during the acute phase of infection. The lowest concentration of SARS-CoV-2 viral copies this assay can detect is 138 copies/mL. A negative result does not preclude SARS-Cov-2 infection and should not be used as the sole basis for treatment or other patient management decisions. A negative result may occur with  improper specimen collection/handling, submission of specimen other than nasopharyngeal swab, presence of viral mutation(s) within the areas targeted by this assay, and inadequate number of viral copies(<138 copies/mL). A negative result must be combined with clinical observations, patient history, and epidemiological information. The expected  result is Negative.  Fact Sheet for Patients:  EntrepreneurPulse.com.au  Fact Sheet for Healthcare Providers:  IncredibleEmployment.be  This test is no                          t yet approved or cleared by the [place] FDA and  has been authorized for detection and/or diagnosis of SARS-CoV-2 by FDA under an Emergency Use Authorization (EUA). This EUA will remain  in effect (meaning this test can be used) for the duration of the COVID-19 declaration under Section 564(b)(1) of the Act, 21 U.S.C.section 360bbb-3(b)(1), unless the authorization is terminated  or revoked sooner.       Influenza A by PCR 01/23/2023 NEGATIVE  NEGATIVE Final   Influenza B by PCR 01/23/2023 NEGATIVE  NEGATIVE Final   Comment: (NOTE) The Xpert Xpress SARS-CoV-2/FLU/RSV plus assay is intended as an aid in the diagnosis of influenza from Nasopharyngeal swab specimens and should not be used as a sole basis for treatment. Nasal washings and aspirates are unacceptable for Xpert Xpress SARS-CoV-2/FLU/RSV testing.  Fact Sheet for  Patients: EntrepreneurPulse.com.au  Fact Sheet for Healthcare Providers: IncredibleEmployment.be  This test is not yet approved or cleared by the [place] FDA and has been authorized for detection and/or diagnosis of SARS-CoV-2 by FDA under an Emergency Use Authorization (EUA). This EUA will remain in effect (meaning this test can be used) for the duration of the COVID-19 declaration under Section 564(b)(1) of the Act, 21 U.S.C. section 360bbb-3(b)(1), unless the authorization is terminated or revoked.     Resp Syncytial Virus by PCR 01/23/2023 NEGATIVE  NEGATIVE Final   Comment: (NOTE) Fact Sheet for Patients: EntrepreneurPulse.com.au  Fact Sheet for Healthcare Providers: IncredibleEmployment.be  This test is not yet approved or cleared by the [place] FDA and has been authorized for detection and/or diagnosis of SARS-CoV-2 by FDA under an Emergency Use Authorization (EUA). This EUA will remain in effect (meaning this test can be used) for the duration of the COVID-19 declaration under Section 564(b)(1) of the Act, 21 U.S.C. section 360bbb-3(b)(1), unless the authorization is terminated or revoked.  Performed at [facility], [place] [address] [place], [place] [address]   Office Visit on 01/05/2023  Component Date Value Ref Range Status   Color, UA 01/05/2023 Orange   Final   Glucose, UA 01/05/2023 Negative  Negative Final   Bilirubin, UA 01/05/2023 Negative   Final   Ketones, UA 01/05/2023 Negative   Final   Spec Grav, UA 01/05/2023 1.020  1.010 - 1.025 Final   Blood, UA 01/05/2023 Moderate (A)   Final   pH, UA 01/05/2023 6.0  5.0 - 8.0 Final   Protein, UA 01/05/2023 Negative  Negative Final   Urobilinogen, UA 01/05/2023 1.0  0.2 or 1.0 E.U./dL Final   Nitrite, UA 01/05/2023 Positive (A)   Final   Leukocytes, UA 01/05/2023 Small (1+) (A)  Negative Final  Abstract on  09/28/2022  Component Date Value Ref Range Status   Cologuard 09/29/2021 Negative  Negative Final   [facility] Colonoscopy 09/29/2021 See Report (in chart)  See Report (in chart), Patient Reported Final    Blood Alcohol level:  Lab Results  Component Value Date   ETH 375 (HH) 01/23/2023   ETH 233 (H) 123XX123    Metabolic Disorder Labs: Lab Results  Component Value Date   HGBA1C 5.0 06/30/2022   MPG 96.8 06/30/2022   MPG 105.41 10/27/2021   No results found for: "  PROLACTIN" Lab Results  Component Value Date   CHOL 264 (H) 06/30/2022   TRIG 63 06/30/2022   HDL 101 06/30/2022   CHOLHDL 2.6 06/30/2022   VLDL 13 06/30/2022   LDLCALC 150 (H) 06/30/2022   LDLCALC 175 (H) 10/27/2021    Therapeutic Lab Levels: No results found for: "LITHIUM" No results found for: "VALPROATE" No results found for: "CBMZ"  Physical Findings   [place] Office Visit from 07/31/2022 in [place] Office Visit from 02/17/2022 in [place] Admission (Discharged) from 10/26/2021 in [place] [facility] Admission (Discharged) from 01/18/2021 in [place] [facility]  AIMS Total Score 0 1 0 0      AUDIT    Flowsheet Row Admission (Discharged) from 06/28/2022 in [place] [facility] Admission (Discharged) from 10/26/2021 in [place] [facility] Admission (Discharged) from 07/08/2021 in [place] [facility] Admission (Discharged) from 01/18/2021 in [place] [facility]  Alcohol Use Disorder Identification Test Final Score (AUDIT) '22 6 7 25      '$ PHQ2-9    Flowsheet Row ED from 01/24/2023 in [facility] Office Visit from 02/22/2022 in [place] ED from 10/26/2021 in [facility] Emergency Department at [facility] Total Score 2 0 4   PHQ-9 Total Score 8 0 13      [place] ED from 01/24/2023 in [facility] ED from 01/23/2023 in [facility] Emergency Department at [facility] ED from 12/01/2022 in [facility] Emergency Department at [place] No Risk No Risk No Risk      Musculoskeletal  Strength & Muscle Tone: within normal limits Gait & Station: normal Patient leans: N/A  Psychiatric Specialty Exam  Presentation General Appearance: Appropriate for Environment  Eye Contact:Fair  Speech:Clear and Coherent  Speech Volume:Normal  Handedness:-- (not assessed)   Mood and Affect  Mood:Euthymic  Affect:Congruent   Thought Process  Thought Processes:Coherent; Linear  Descriptions of Associations:Intact  Orientation:Full (Time, Place and Person)  Thought Content:Logical    Hallucinations:Hallucinations: None  Ideas of Reference:None  Suicidal Thoughts:Suicidal Thoughts: No  Homicidal Thoughts:Homicidal Thoughts: No   Sensorium  Memory:Immediate Fair; Recent Fair; Remote Fair  Judgment:Fair  Insight:Fair   Executive Functions  Concentration:Fair  Attention Span:Fair  [place]   Psychomotor Activity  Psychomotor Activity:Psychomotor Activity: Normal   Assets  Assets:Communication Skills; Resilience   Sleep  Sleep:Sleep: Fair   Nutritional Assessment (For OBS and FBC admissions only) Has the patient had a weight loss or gain of 10 pounds or more in the last 3 months?: No Has the patient had a decrease in food intake/or appetite?: Yes Does the patient have dental problems?: No Does the patient have eating habits or behaviors that may be indicators of an eating disorder including binging or inducing vomiting?: No Has the patient recently lost weight without trying?: 0 Has the patient been eating poorly because of a decreased appetite?: 0 Malnutrition Screening  Tool Score: 0    Physical Exam Constitutional:      Appearance: the patient is not toxic-appearing.  Pulmonary:     Effort: Pulmonary effort is normal.  Neurological:     General: No focal deficit present.     Mental Status: the patient is alert and oriented to person, place, and time.  Review of Systems  Respiratory:  Negative for shortness of breath.   Cardiovascular:  Negative for chest pain.  Gastrointestinal:  Negative for abdominal pain, constipation, diarrhea, nausea and vomiting.  Neurological:  Negative for headaches.    BP 100/67 (BP Location: Left Arm)   Pulse 61   Temp 98.5 F (36.9 C) (Oral)   Resp 18   SpO2 100%   Assessment and Plan:  Status: Involuntary, 24-hour facility exam paperwork filled out by me -Involuntary commitment changed on 3/8 to voluntary, paperwork filled out by me   Alcohol use disorder, reported history of alcohol withdrawal seizures, no documented history of alcohol withdrawal seizures, (her described symptomatology is not consistent with seizure based on my interview) - Librium detox, CIWA with as needed Ativan, as needed Ativan injection for seizure   Continue patient's home prescriptions for generalized anxiety disorder - Paxil 20 mg daily - Gabapentin 100 mg 3 times daily - Trazodone 50 mg nightly for insomnia - Lamotrigine 100 mg nightly (patient reports consistent adherence) - Patient needs prescription of lamotrigine sent to [facility] on [address]: - Lab work unremarkable - UDS negative, BAL 375 - Voltaren gel in place of patient's reported Celebrex (she reports uncertainty as to whether she is taking this medication or a different 1)     Corky Sox, MD 01/26/2023 4:14 PM

## 2023-01-26 NOTE — Progress Notes (Signed)
Pt awake, alert and oriented X4. Pt complained of back pain due to sleeping on the bed in her room. No signs of acute distress noted. Administered scheduled meds with no issue. Pt denies current SI/HI/AVH, plan or intent. Staff will monitor for pt's safety.

## 2023-01-26 NOTE — ED Notes (Signed)
Pt a/o x4. Denies SI/HI/AVH. Watching tv with peers . No noted distress.Denies s/s of withdrawal. Will continue to monitor for safety

## 2023-01-27 DIAGNOSIS — F1090 Alcohol use, unspecified, uncomplicated: Secondary | ICD-10-CM | POA: Diagnosis not present

## 2023-01-27 MED ORDER — LAMOTRIGINE 100 MG PO TABS: 100.0000 mg | ORAL_TABLET | Freq: Every day | ORAL | 0 refills | Status: AC

## 2023-01-27 MED ORDER — NICOTINE 21 MG/24HR TD PT24: 21.0000 mg | MEDICATED_PATCH | Freq: Every day | TRANSDERMAL | 0 refills | Status: AC | PRN

## 2023-01-27 NOTE — ED Notes (Signed)
Discharge instructions provided and Pt stated understanding. Pt alert, orient and ambulatory prior to d/c from facility. Personal belongings returned from locker number 5. Safety maintained.

## 2023-01-27 NOTE — Group Note (Signed)
Group Topic: Recovery Basics  Group Date: 01/27/2023 Start Time: 0900 End Time: 1000 Facilitators: Parks Neptune, NT  Department: [facility]  Number of Participants: 8  Group Focus: daily focus and substance abuse education Treatment Modality:  Psychoeducation Interventions utilized were clarification, group exercise, and support Purpose: enhance coping skills and increase insight  Name: Debra Rhodes Date of Birth: Jun 12, 1961  MR: [id number]    Level of Participation: moderate Quality of Participation: engaged Interactions with others: positive Mood/Affect: appropriate Triggers (if applicable): n/a Cognition: coherent/clear Progress: Moderate Response: positive Plan: follow-up needed  Patients Problems:  Patient Active Problem List   Diagnosis Date Noted   Alcohol use disorder 01/24/2023   Alcohol abuse with intoxication ([place]) 01/23/2023   Postmenopausal hormone therapy 09/28/2022   Bipolar 1 disorder, depressed, moderate ([place]) 06/28/2022   Overdose 06/23/2022   Acute respiratory failure ([place])    Acute encephalopathy    Chronic alcoholism in remission ([place]) 02/22/2022   Other specified behavioral and emotional disorders with onset usually occurring in childhood and adolescence 02/22/2022   Primary insomnia 02/22/2022   Raynaud's disease 02/22/2022   Acute non-recurrent frontal sinusitis 12/22/2021   Neoplasm of uncertain behavior of skin 11/01/2021   Generalized anxiety disorder 10/27/2021   Osteoarthritis of left knee 10/23/2021   Osteoarthrosis of hand 10/23/2021   Iron deficiency anemia 09/27/2021   Mixed hyperlipidemia 09/27/2021   Nasal congestion 08/16/2021   Suicidal ideation    Depression    MDD (major depressive disorder), recurrent severe, without psychosis ([place]) 01/21/2021   Deviated septum 06/30/2019   Mixed conductive and sensorineural hearing loss of both ears 06/30/2019   Nasal turbinate hypertrophy 06/30/2019    Rhinitis, chronic 06/30/2019   Tinnitus aurium, bilateral 06/30/2019   S/P right TKA 05/13/2019   Synovial cyst of knee 02/10/2019

## 2023-01-27 NOTE — ED Provider Notes (Signed)
FBC/OBS ASAP Discharge Summary  Date and Time: 01/27/2023 10:13 AM  Name: Debra Rhodes  MRN:  [id number]   Discharge Diagnoses:  Final diagnoses:  Alcohol use disorder    Subjective:   Debra Rhodes is a  62 year old female with a history of alcohol use disorder and major depression, several previous psychiatric admissions, most recently in August after a large overdose that resulted in intubation and ICU stay.  She has been to residential rehab many times but has had difficulty with sobriety.  The patient presented to the emergency department when her daughter called EMS due to concern that she had consumed too much alcohol.  While there the patient was put under involuntary commitment by the emergency department physician due to her level of intoxication, recent serious suicide attempt, and having a large amount of prescription medication in her purse.  Nursing staff communicated handoff that the patient reported suicidal thoughts.  She is documented as denying suicidal thoughts to the emergency department physician and the psychiatric provider.  The patient was transferred to the facility based crisis.   Stay Summary:   During stay patient was treated for Etoh use dx, with a Librium taper over the course of 3 days. Patient did not have significant complications on taper. The last 48h of her admission, patient CIWA ranged 0-1. Patient had no behavior events on the unit and was overall pleasant, participating in unit activities. Patient endorse good appetite, but struggled with sleep dur to physical discomfort from the beds. Patient appropriately communicated this and did occasionally take PRN Tylenol and Advil. Patient denies SI, HI, and AVH the and endorsed good insight and judgement. Patient planned to attend a residential rehab, [facility] and is looking forward to this. Patient was continued on her home medications, Paxil '20mg'$  daily, Lamictal '100mg'$  QHS, gabapentin '100mg'$  TID, and Trazodone '50mg'$   QHS during her stay. No adjustments were necessary.   Patient and team felt patient could go home in time for her daughter's birthday before her family taker her to rehab. Patient's family is supportive, and patient reports that her family does not bring Etoh around her and feels confident she will get to rehab without relapse. Patien endorsed improved mood over the course of stay.    Total Time spent with patient: 20 minutes  Past Psychiatric History: as above Past Medical History: as above Family History: none Family Psychiatric  History: none Social History: as above and per H and P Tobacco Cessation:  A prescription for an FDA-approved tobacco cessation medication provided at discharge  Current Medications:  Current Facility-Administered Medications  Medication Dose Route Frequency Provider Last Rate Last Admin   acetaminophen (TYLENOL) tablet 650 mg  650 mg Oral Q6H PRN Corky Sox, MD   650 mg at 01/26/23 1827   alum & mag hydroxide-simeth (MAALOX/MYLANTA) 200-200-20 MG/5ML suspension 30 mL  30 mL Oral Q4H PRN Corky Sox, MD       diclofenac Sodium (VOLTAREN) 1 % topical gel 2 g  2 g Topical TID Corky Sox, MD   2 g at 01/26/23 0855   gabapentin (NEURONTIN) capsule 100 mg  100 mg Oral TID Corky Sox, MD   100 mg at 01/27/23 0948   hydrOXYzine (ATARAX) tablet 10 mg  10 mg Oral TID PRN Corky Sox, MD   10 mg at 01/24/23 1034   ibuprofen (ADVIL) tablet 600 mg  600 mg Oral Q12H PRN Corky Sox, MD   600 mg at 01/27/23 0948   lamoTRIgine (LAMICTAL)  tablet 100 mg  100 mg Oral QHS Corky Sox, MD   100 mg at 01/26/23 2122   LORazepam (ATIVAN) injection 2 mg  2 mg Intramuscular Once PRN Corky Sox, MD       LORazepam (ATIVAN) tablet 1 mg  1 mg Oral Q6H PRN Corky Sox, MD       magnesium hydroxide (MILK OF MAGNESIA) suspension 30 mL  30 mL Oral Daily PRN Corky Sox, MD       multivitamin with minerals tablet 1 tablet  1 tablet Oral Daily  Corky Sox, MD   1 tablet at 01/27/23 0948   nicotine (NICODERM CQ - dosed in mg/24 hours) patch 21 mg  21 mg Transdermal Daily PRN Corky Sox, MD       PARoxetine (PAXIL) tablet 20 mg  20 mg Oral Daily Corky Sox, MD   20 mg at 01/27/23 [id number]   thiamine (VITAMIN B1) tablet 100 mg  100 mg Oral Daily Corky Sox, MD   100 mg at 01/27/23 0949   traZODone (DESYREL) tablet 50 mg  50 mg Oral Sharmon Leyden, MD   50 mg at 01/26/23 2122   Current Outpatient Medications  Medication Sig Dispense Refill   lamoTRIgine (LAMICTAL) 100 MG tablet Take 1 tablet (100 mg total) by mouth at bedtime. 30 tablet 0   CELEBREX 100 MG capsule Take 100 mg by mouth 2 (two) times daily.     cetirizine (ZYRTEC) 10 MG tablet Take 10 mg by mouth daily.     estradiol (ESTRACE) 0.5 MG tablet Take 1 tablet (0.5 mg total) by mouth daily. (Patient not taking: Reported on 01/24/2023) 90 tablet 1   gabapentin (NEURONTIN) 100 MG capsule Take 1 capsule (100 mg total) by mouth 3 (three) times daily as needed. 180 capsule 1   Multiple Vitamin (MULTIVITAMIN) tablet Take 1 tablet by mouth daily.     nicotine (NICODERM CQ - DOSED IN MG/24 HOURS) 21 mg/24hr patch Place 1 patch (21 mg total) onto the skin daily as needed (nicotine dependence). 28 patch 0   PARoxetine (PAXIL) 20 MG tablet Take 1 tablet (20 mg total) by mouth daily. (Patient not taking: Reported on 01/24/2023) 90 tablet 1   progesterone (PROMETRIUM) 100 MG capsule Take 1 capsule (100 mg total) by mouth at bedtime. (Patient not taking: Reported on 01/24/2023) 90 capsule 1   sulfamethoxazole-trimethoprim (BACTRIM DS) 800-160 MG tablet Take 1 tablet by mouth 2 (two) times daily after a meal. (Patient not taking: Reported on 01/09/2023) 6 tablet 0   traZODone (DESYREL) 50 MG tablet Take 1-2 tabs at bedtime as needed for insomnia (Patient not taking: Reported on 01/24/2023) 180 tablet 1    PTA Medications:  Facility Ordered Medications  Medication   acetaminophen  (TYLENOL) tablet 650 mg   alum & mag hydroxide-simeth (MAALOX/MYLANTA) 200-200-20 MG/5ML suspension 30 mL   magnesium hydroxide (MILK OF MAGNESIA) suspension 30 mL   hydrOXYzine (ATARAX) tablet 10 mg   nicotine (NICODERM CQ - dosed in mg/24 hours) patch 21 mg   multivitamin with minerals tablet 1 tablet   [EXPIRED] loperamide (IMODIUM) capsule 2-4 mg   [EXPIRED] ondansetron (ZOFRAN-ODT) disintegrating tablet 4 mg   [COMPLETED] chlordiazePOXIDE (LIBRIUM) capsule 25 mg   Followed by   [COMPLETED] chlordiazePOXIDE (LIBRIUM) capsule 25 mg   thiamine (VITAMIN B1) tablet 100 mg   LORazepam (ATIVAN) tablet 1 mg   gabapentin (NEURONTIN) capsule 100 mg   LORazepam (ATIVAN) injection 2 mg   PARoxetine (PAXIL) tablet 20 mg  traZODone (DESYREL) tablet 50 mg   diclofenac Sodium (VOLTAREN) 1 % topical gel 2 g   lamoTRIgine (LAMICTAL) tablet 100 mg   ibuprofen (ADVIL) tablet 600 mg   PTA Medications  Medication Sig   lamoTRIgine (LAMICTAL) 100 MG tablet Take 1 tablet (100 mg total) by mouth at bedtime.   cetirizine (ZYRTEC) 10 MG tablet Take 10 mg by mouth daily.   Multiple Vitamin (MULTIVITAMIN) tablet Take 1 tablet by mouth daily.   estradiol (ESTRACE) 0.5 MG tablet Take 1 tablet (0.5 mg total) by mouth daily. (Patient not taking: Reported on 01/24/2023)   progesterone (PROMETRIUM) 100 MG capsule Take 1 capsule (100 mg total) by mouth at bedtime. (Patient not taking: Reported on 01/24/2023)   sulfamethoxazole-trimethoprim (BACTRIM DS) 800-160 MG tablet Take 1 tablet by mouth 2 (two) times daily after a meal. (Patient not taking: Reported on 01/09/2023)   traZODone (DESYREL) 50 MG tablet Take 1-2 tabs at bedtime as needed for insomnia (Patient not taking: Reported on 01/24/2023)   gabapentin (NEURONTIN) 100 MG capsule Take 1 capsule (100 mg total) by mouth 3 (three) times daily as needed.   PARoxetine (PAXIL) 20 MG tablet Take 1 tablet (20 mg total) by mouth daily. (Patient not taking: Reported on  01/24/2023)   CELEBREX 100 MG capsule Take 100 mg by mouth 2 (two) times daily.   nicotine (NICODERM CQ - DOSED IN MG/24 HOURS) 21 mg/24hr patch Place 1 patch (21 mg total) onto the skin daily as needed (nicotine dependence).       01/27/2023    9:51 AM 01/24/2023    2:16 PM 02/22/2022    8:08 AM  Depression screen PHQ 2/9  Decreased Interest 1 1 0  Down, Depressed, Hopeless 1 1 0  PHQ - 2 Score 2 2 0  Altered sleeping 1 1 0  Tired, decreased energy 2 1 0  Change in appetite 2 1 0  Feeling bad or failure about yourself  3 1 0  Trouble concentrating 2 1 0  Moving slowly or fidgety/restless 2 1 0  Suicidal thoughts 1 0 0  PHQ-9 Score 15 8 0  Difficult doing work/chores  Somewhat difficult Not difficult at all    [place] ED from 01/24/2023 in [facility] ED from 01/23/2023 in [facility] Emergency Department at [facility] ED from 12/01/2022 in [facility] Emergency Department at [place] No Risk No Risk No Risk       Musculoskeletal  Strength & Muscle Tone: within normal limits Gait & Station: normal Patient leans: N/A  Psychiatric Specialty Exam  Presentation  General Appearance:  Appropriate for Environment; Casual  Eye Contact: Good  Speech: Clear and Coherent  Speech Volume: Normal  Handedness: Right   Mood and Affect  Mood: Anxious (appropriate)  Affect: Appropriate   Thought Process  Thought Processes: Coherent  Descriptions of Associations:Intact  Orientation:Full (Time, Place and Person)  Thought Content:Logical  Diagnosis of Schizophrenia or Schizoaffective disorder in past: No data recorded   Hallucinations:Hallucinations: None  Ideas of Reference:None  Suicidal Thoughts:Suicidal Thoughts: No  Homicidal Thoughts:Homicidal Thoughts: No   Sensorium  Memory: Immediate Good; Recent Good  Judgment: Good  Insight: Good   Executive Functions   Concentration: Good  Attention Span: Good  Recall: [place] of Knowledge: Good  Language: Good   Psychomotor Activity  Psychomotor Activity: Psychomotor Activity: Normal   Assets  Assets: Communication Skills; Housing; Resilience; Social Support; Desire for Improvement  Sleep  Sleep: Sleep: Fair   No data recorded  Physical Exam  Physical Exam HENT:     Head: Atraumatic.  Pulmonary:     Effort: Pulmonary effort is normal.  Neurological:     Mental Status: She is alert and oriented to person, place, and time. Mental status is at baseline.    Review of Systems  Psychiatric/Behavioral:  Negative for hallucinations and suicidal ideas. The patient is nervous/anxious and has insomnia.    Blood pressure 105/69, pulse 64, temperature 98.3 F (36.8 C), temperature source Rectal, resp. rate 16, SpO2 98 %. There is no height or weight on file to calculate BMI.  Demographic Factors:  Caucasian  Loss Factors: NA  Historical Factors: Prior suicide attempts  Risk Reduction Factors:   Sense of responsibility to family and Positive social support  Continued Clinical Symptoms:  Alcohol/Substance Abuse/Dependencies Previous Psychiatric Diagnoses and Treatments Medical Diagnoses and Treatments/Surgeries  Cognitive Features That Contribute To Risk:  None    Suicide Risk:  Mild:  Suicidal ideation of limited frequency, intensity, duration, and specificity, due to patient's hx of suicide attempts in the past.  There are currently no identifiable plans, no associated intent, mild dysphoria and related symptoms, good self-control (both objective and subjective assessment), few other risk factors, and identifiable protective factors, including available and accessible social support.  Plan Of Care/Follow-up recommendations:  Follow up recommendations: - Activity as tolerated. - Diet as recommended by PCP. - Keep all scheduled follow-up appointments as  recommended.   Disposition: Home with daughter, but [facility] via Family on 01/29/2023   PGY-3 Freida Busman, MD 01/27/2023, 10:13 AM

## 2023-03-13 ENCOUNTER — Telehealth: Payer: Self-pay | Admitting: Psychiatry

## 2023-03-13 NOTE — Telephone Encounter (Signed)
Pt lvm that her daughters want her to move in with them in [place]. She needs a letter for her leasing company stating that due to medical reasons it is better for her to move to [place]. Please call her at [phone] and break her lease early

## 2023-03-15 NOTE — Telephone Encounter (Signed)
LVM to RC 

## 2023-03-22 NOTE — Telephone Encounter (Signed)
Left second VM to RC.  

## 2023-03-27 ENCOUNTER — Inpatient Hospital Stay: Admission: RE | Admit: 2023-03-27 | Payer: Commercial Managed Care - HMO | Source: Ambulatory Visit

## 2023-04-03 DIAGNOSIS — Z131 Encounter for screening for diabetes mellitus: Secondary | ICD-10-CM | POA: Diagnosis not present

## 2023-04-03 DIAGNOSIS — Z803 Family history of malignant neoplasm of breast: Secondary | ICD-10-CM | POA: Diagnosis not present

## 2023-04-03 DIAGNOSIS — J3089 Other allergic rhinitis: Secondary | ICD-10-CM | POA: Diagnosis not present

## 2023-04-03 DIAGNOSIS — Z13820 Encounter for screening for osteoporosis: Secondary | ICD-10-CM | POA: Diagnosis not present

## 2023-04-03 DIAGNOSIS — Z0000 Encounter for general adult medical examination without abnormal findings: Secondary | ICD-10-CM | POA: Diagnosis not present

## 2023-04-03 DIAGNOSIS — E538 Deficiency of other specified B group vitamins: Secondary | ICD-10-CM | POA: Diagnosis not present

## 2023-04-03 DIAGNOSIS — Z8049 Family history of malignant neoplasm of other genital organs: Secondary | ICD-10-CM | POA: Diagnosis not present

## 2023-04-03 DIAGNOSIS — F5104 Psychophysiologic insomnia: Secondary | ICD-10-CM | POA: Diagnosis not present

## 2023-04-03 DIAGNOSIS — M1712 Unilateral primary osteoarthritis, left knee: Secondary | ICD-10-CM | POA: Diagnosis not present

## 2023-04-03 DIAGNOSIS — Z6372 Alcoholism and drug addiction in family: Secondary | ICD-10-CM | POA: Diagnosis not present

## 2023-04-03 DIAGNOSIS — F1021 Alcohol dependence, in remission: Secondary | ICD-10-CM | POA: Diagnosis not present

## 2023-04-03 DIAGNOSIS — Z7689 Persons encountering health services in other specified circumstances: Secondary | ICD-10-CM | POA: Diagnosis not present

## 2023-04-03 DIAGNOSIS — Z124 Encounter for screening for malignant neoplasm of cervix: Secondary | ICD-10-CM | POA: Diagnosis not present

## 2023-04-03 DIAGNOSIS — Z780 Asymptomatic menopausal state: Secondary | ICD-10-CM | POA: Diagnosis not present

## 2023-04-03 DIAGNOSIS — F329 Major depressive disorder, single episode, unspecified: Secondary | ICD-10-CM | POA: Diagnosis not present

## 2023-04-03 DIAGNOSIS — Z79890 Hormone replacement therapy (postmenopausal): Secondary | ICD-10-CM | POA: Diagnosis not present

## 2023-04-03 DIAGNOSIS — Z136 Encounter for screening for cardiovascular disorders: Secondary | ICD-10-CM | POA: Diagnosis not present

## 2023-04-03 DIAGNOSIS — Z8639 Personal history of other endocrine, nutritional and metabolic disease: Secondary | ICD-10-CM | POA: Diagnosis not present

## 2023-04-03 DIAGNOSIS — Z13220 Encounter for screening for lipoid disorders: Secondary | ICD-10-CM | POA: Diagnosis not present

## 2023-04-03 DIAGNOSIS — Z1159 Encounter for screening for other viral diseases: Secondary | ICD-10-CM | POA: Diagnosis not present

## 2023-04-03 DIAGNOSIS — H903 Sensorineural hearing loss, bilateral: Secondary | ICD-10-CM | POA: Diagnosis not present

## 2023-04-03 DIAGNOSIS — Z01419 Encounter for gynecological examination (general) (routine) without abnormal findings: Secondary | ICD-10-CM | POA: Diagnosis not present

## 2023-04-03 DIAGNOSIS — Z1239 Encounter for other screening for malignant neoplasm of breast: Secondary | ICD-10-CM | POA: Diagnosis not present

## 2023-04-03 DIAGNOSIS — Z1283 Encounter for screening for malignant neoplasm of skin: Secondary | ICD-10-CM | POA: Diagnosis not present

## 2023-04-03 DIAGNOSIS — Z758 Other problems related to medical facilities and other health care: Secondary | ICD-10-CM | POA: Diagnosis not present

## 2023-04-03 DIAGNOSIS — Z87410 Personal history of cervical dysplasia: Secondary | ICD-10-CM | POA: Diagnosis not present

## 2023-04-03 DIAGNOSIS — E559 Vitamin D deficiency, unspecified: Secondary | ICD-10-CM | POA: Diagnosis not present

## 2023-04-03 DIAGNOSIS — Z1211 Encounter for screening for malignant neoplasm of colon: Secondary | ICD-10-CM | POA: Diagnosis not present

## 2023-04-03 DIAGNOSIS — F419 Anxiety disorder, unspecified: Secondary | ICD-10-CM | POA: Diagnosis not present

## 2023-04-13 DIAGNOSIS — L578 Other skin changes due to chronic exposure to nonionizing radiation: Secondary | ICD-10-CM | POA: Diagnosis not present

## 2023-04-13 DIAGNOSIS — L700 Acne vulgaris: Secondary | ICD-10-CM | POA: Diagnosis not present

## 2023-04-13 DIAGNOSIS — D2339 Other benign neoplasm of skin of other parts of face: Secondary | ICD-10-CM | POA: Diagnosis not present

## 2023-04-13 DIAGNOSIS — L821 Other seborrheic keratosis: Secondary | ICD-10-CM | POA: Diagnosis not present

## 2023-04-13 DIAGNOSIS — Z1283 Encounter for screening for malignant neoplasm of skin: Secondary | ICD-10-CM | POA: Diagnosis not present

## 2023-05-05 ENCOUNTER — Other Ambulatory Visit: Payer: Self-pay

## 2023-05-05 ENCOUNTER — Emergency Department
Admission: EM | Admit: 2023-05-05 | Discharge: 2023-05-05 | Disposition: A | Discharge status: Home / Self Care | Payer: Commercial Managed Care - HMO | Attending: Student in an Organized Health Care Education/Training Program | Admitting: Student in an Organized Health Care Education/Training Program

## 2023-05-05 DIAGNOSIS — R45851 Suicidal ideations: Secondary | ICD-10-CM | POA: Diagnosis not present

## 2023-05-05 DIAGNOSIS — F419 Anxiety disorder, unspecified: Secondary | ICD-10-CM | POA: Diagnosis not present

## 2023-05-05 DIAGNOSIS — F329 Major depressive disorder, single episode, unspecified: Secondary | ICD-10-CM | POA: Diagnosis not present

## 2023-05-05 DIAGNOSIS — K2920 Alcoholic gastritis without bleeding: Secondary | ICD-10-CM | POA: Diagnosis not present

## 2023-05-05 DIAGNOSIS — R1110 Vomiting, unspecified: Secondary | ICD-10-CM | POA: Diagnosis not present

## 2023-05-05 DIAGNOSIS — F1010 Alcohol abuse, uncomplicated: Secondary | ICD-10-CM | POA: Diagnosis not present

## 2023-05-05 LAB — COMPREHENSIVE METABOLIC PANEL, BLOOD
ALT (SGPT): 15 U/L (ref 0–33)
AST (SGOT): 27 U/L (ref 0–32)
Albumin: 4.6 g/dL (ref 3.5–5.2)
Alkaline Phos: 61 U/L (ref 40–130)
Anion Gap: 18 mmol/L — ABNORMAL HIGH (ref 7–15)
BUN: 12 mg/dL (ref 8–23)
Bicarbonate: 24 mmol/L (ref 22–29)
Bilirubin, Tot: 0.54 mg/dL (ref <1.2)
Calcium: 9.4 mg/dL (ref 8.5–10.6)
Chloride: 97 mmol/L — ABNORMAL LOW (ref 98–107)
Creatinine: 0.77 mg/dL (ref 0.51–0.95)
Glucose: 75 mg/dL (ref 70–99)
Potassium: 3.9 mmol/L (ref 3.5–5.1)
Sodium: 139 mmol/L (ref 136–145)
Total Protein: 7.6 g/dL (ref 6.0–8.0)
eGFR Based on CKD-EPI 2021 Equation: >60 mL/min/{1.73_m2}

## 2023-05-05 LAB — ALCOHOL, BLOOD: Alcohol: 190 mg/dL

## 2023-05-05 LAB — UR DRUGS OF ABUSE SCREEN
Amphetamines Screen: NEGATIVE
Cocaine Screen: NEGATIVE
Methadone Screen: NEGATIVE
Opiates Screen: NEGATIVE
Oxycodone Screen: NEGATIVE
Phencyclidine Screen: NEGATIVE
THC Screen: NEGATIVE
UR Benzodiazepines High Sensitivity Screen: NEGATIVE
UR Fentanyl Screen: NEGATIVE

## 2023-05-05 LAB — CBC WITH DIFF, BLOOD
ANC-Automated: 4.3 10*3/uL (ref 1.6–7.0)
Abs Basophils: 0 10*3/uL (ref <0.2)
Abs Eosinophils: 0 10*3/uL (ref 0.0–0.5)
Abs Lymphs: 1.3 10*3/uL (ref 0.8–3.1)
Abs Monos: 0.3 10*3/uL (ref 0.2–0.8)
Basophils: 0.7 %
Eosinophils: 0.5 %
Hct: 41.4 % (ref 34.0–45.0)
Hgb: 14.1 gm/dL (ref 11.2–15.7)
Imm Gran %: 0.3 % (ref <1)
Lymphocytes: 21.2 %
MCH: 31.3 pg (ref 26.0–32.0)
MCHC: 34.1 g/dL (ref 32.0–36.0)
MCV: 92 um3 (ref 79.0–95.0)
MPV: 10 fL (ref 9.4–12.4)
Monocytes: 4.9 %
Plt Count: 246 10*3/uL (ref 140–370)
RBC: 4.5 10*6/uL (ref 3.90–5.20)
RDW: 13.2 % (ref 12.0–14.0)
Segs: 72.4 %
WBC: 5.9 10*3/uL (ref 4.0–10.0)

## 2023-05-05 LAB — PROTHROMBIN TIME, BLOOD
INR: 0.9
PT,Patient: 9.8 s (ref 9.7–12.5)

## 2023-05-05 LAB — TYPE & SCREEN
ABO/RH: A POS
Antibody Screen: NEGATIVE

## 2023-05-05 LAB — ECG 12-LEAD
ATRIAL RATE: 75 {beats}/min
ECG INTERPRETATION: NORMAL
P AXIS: 47 degrees
PR INTERVAL: 132 ms
QRS INTERVAL/DURATION: 76 ms
QT: 390 ms
QTc (Bazett): 435 ms
QTc (Fredericia): 419 ms
R AXIS: 52 degrees
T AXIS: 57 degrees
VENTRICULAR RATE: 75 {beats}/min

## 2023-05-05 LAB — INFLUENZA A/B & SARS-COV-2 PCR COMBO FOR RAPID RESPONSE LAB
Influenza A PCR, RRL: NOT DETECTED
Influenza B PCR, RRL: NOT DETECTED
SARS-CoV-2 PCR, RRL: NOT DETECTED

## 2023-05-05 LAB — TSH, BLOOD: TSH: 0.93 u[IU]/mL (ref 0.27–4.20)

## 2023-05-05 LAB — ABO/RH CONFIRMATION: ABO/RH: A POS

## 2023-05-05 LAB — MAGNESIUM, BLOOD: Magnesium: 2.2 mg/dL (ref 1.6–2.4)

## 2023-05-05 LAB — LIPASE, BLOOD: Lipase: 23 U/L (ref 13–60)

## 2023-05-05 LAB — APTT, BLOOD: PTT: 27 s (ref 27–36)

## 2023-05-05 MED ORDER — ONDANSETRON HCL 4 MG/2ML IV SOLN
4.0000 mg | Freq: Once | INTRAMUSCULAR | Status: AC
Start: 2023-05-05 — End: 2023-05-05
  Administered 2023-05-05: 4 mg via INTRAVENOUS
  Filled 2023-05-05: qty 2

## 2023-05-05 MED ORDER — PHENOBARBITAL SODIUM 130 MG/ML IJ SOLN
260.0000 mg | INTRAMUSCULAR | Status: AC
Start: 2023-05-05 — End: 2023-05-05
  Administered 2023-05-05 (×2): 260 mg via INTRAVENOUS
  Filled 2023-05-05 (×2): qty 2

## 2023-05-05 MED ORDER — SODIUM CHLORIDE 0.9 % IJ SOLN FLUSH
80.0000 mg | Freq: Once | INTRAVENOUS | Status: AC
Start: 2023-05-05 — End: 2023-05-05
  Administered 2023-05-05: 80 mg via INTRAVENOUS
  Filled 2023-05-05: qty 80

## 2023-05-05 MED ORDER — LACTATED RINGERS IV SOLN
Freq: Once | INTRAVENOUS | Status: AC
Start: 2023-05-05 — End: 2023-05-05

## 2023-05-05 MED ORDER — OMEPRAZOLE 20 MG OR CPDR
20.0000 mg | DELAYED_RELEASE_CAPSULE | Freq: Two times a day (BID) | ORAL | 0 refills | Status: AC
Start: 2023-05-05 — End: 2023-05-19
  Filled 2023-05-05: qty 28, 14d supply, fill #0

## 2023-05-05 MED ORDER — PANTOPRAZOLE SODIUM 40 MG IV SOLR
40.0000 mg | Freq: Two times a day (BID) | INTRAVENOUS | Status: DC
Start: 2023-05-06 — End: 2023-05-05

## 2023-05-05 NOTE — Discharge Instructions (Signed)
You have been evaluated in the Emergency Department today. Your evaluation has not indicated the presence of a medical emergency at this time.    However, you will need to follow-up with your primary care provider in 1-3 days time for repeat evaluation. The follow-up appointment is a very important part of your ongoing medical evaluation and management and should not be skipped.    If you are feeling worse or develop any new or concerning symptoms, return to the emergency department immediately.

## 2023-05-05 NOTE — ED Notes (Addendum)
Pt ambulates in hallway with slow but steady gait. Pt tolerating PO intake.

## 2023-05-05 NOTE — Progress Notes (Signed)
Psychiatry Consult Note    Date of Admission: 05/05/2023  Consulting Attending: Everardo Pacific, MD  Reason for Consult: SI    History of Present Illness:     Latasha Cunningham is a 62 year old female with a signficant past medical history of alcohol use disorder, anxiety, major depressive disorder presenting to the ED with multiple complaints.      Patient seen and assessed. Patient states 'I relapsed with my drinking' the last few days, and denies recalling saying reported statements of suicidal ideation. Pt at this time, is future-oriented, stating she is looking forward to getting her health better, and identies her family/sister as reasons to live. Last two weeks, patient denies anhedonia, sleep disturbances, feelings of guilt, anergia, difficulty concentrating, appetite changes, psychomotor retardation, and denies SI/plan to take life at this time. Last two weeks patient denies manic symptoms of distractibility, high energy with little sleep, grandiosity, talking fast, racing thoughts, and reckless impulsive actions.     Past Psychiatric History:     1) Diagnoses: MDD  2) Suicide attempts: denies   3) Inpatient Hospitalizations: states occurred 'years ago'.   4) Outpatient treatment/psychiatrist: denies   5) Medication Trials: denies  6)The patient is unwilling to answer questions regarding psychological trauma.    Substance History:  Etoh abuse recent    Review of Systems - All others negative    Medical History:   There is no problem list on file for this patient.      Allergies:   Allergies   Allergen Reactions    Latex Swelling       Medications:   Scheduled:   [START ON 05/06/2023] pantoprazole  40 mg Q12H                        Vital Signs:  Blood pressure 128/87, pulse 88, temperature 99.1 F (37.3 C), resp. rate 18, height 5\' 4"  (1.626 m), weight 60.6 kg (133 lb 9.6 oz), SpO2 98%.    Mental Status Examination:   Appearance: sitting in hospital bed, in gown.  Behavior: Calm and cooperative    Motor/Abnormal Involuntary Movements: no abnormal movements noted   Gait: not assessed   Speech: clear with normal rate and volume     Mood: 'I'm ok'   Affect: congruent   Thought Process: coherent, logical  Associations: linear   Thought Content: denies SI/HI   Perceptions: no abnl  Insight/Judgment: Fair.   Sensorium, Orientation & Intellectual Functions: intact        No results found for: "AMPCLASS", "BARBCLASS", "BENZYLCGNN", "BENZDIAZCL", "METHADONE", "OPIATESCL", "OXY", "PHENCYCLDN", "TETCANNABIN", "UDI"     Lab Results   Component Value Date    ETOH 190 05/05/2023         Narrative Assessment:     Patient denies acute plan of self-harm, and identifies reasons to live. Thus, does not meet criteria for inpatient psychiatric hospitatlzation. Recommend SW to give resources to set up outpatient psychiatric care as soon as possible. Denies SI/HI/AVH.     A comprehensive suicide risk assessment was performed and the patient was assessed to be at a intermediate acute risk of self-harm.  Modifiable risk factors include precipitating stressors.  Non-modifiable risk factors include older age.  The patient also has protective factors of future life plans and coping skills.    A violence risk assessment was also performed. The following behavioral risk factors are associated with acute violence risk and have been present within the past 24  hours: none. Based on these factors, this patient's acute risk of violence in the inpatient setting in the next 24 hours is assessed to be low. Historical (non-modifiable) risk factors for violence in this patient include: none.      DSM-5 Diagnoses:  MDD    Recommendations:    - Disposition plan: discharge  - Scheduled medications: as per medicine  - PRN medications (for agitation/anxiety): none  - Medical clearance concerns: as per medicine  - Legal status: voluntary  - Level of observation: routine      For medical clearance for inpatient psychiatric admission: In order to be  medically stable for the [facility] psychiatric unit, patient needs to have no lines/tubes (MAY HAVE A FOLEY), not require continuous oxygen, not require IV fluids or IV medications for 24 hours, be able to eat/drink, be able to void, be able to transfer from bed to chair with assist safely, may use an assistive device (no canes), and be stable enough to require vital sign monitoring no more frequently than every 8 hours. Patient needs to have an acute psychiatric need and is not a candidate for hospice enrollment.    Thank you for this consult. Please page psychiatry if additional questions. If patient is in the [facility] emergency department, page [phone] to reach the psychiatry ED resident. If patient is on a [facility] inpatient service, page [phone] to reach the inpatient psychiatry consult team. If the patient is in the [facility], please consult the paging website for the provider on call.

## 2023-05-05 NOTE — ED Notes (Signed)
Bed: L1  Expected date:   Expected time:   Means of arrival: Automobile  Comments:

## 2023-05-05 NOTE — ED Provider Notes (Signed)
EMERGENCY DEPARTMENT NOTE  [facility] electronic medical record reviewed for pertinent medical history.     Patient: Latasha Cunningham  MRN: [id number]  DOB: Nov 26, 1960  PCP: Provider, Not In System    Nursing triage CC:  Chief Complaint   Patient presents with    Alcohol Problem     Recovering alcoholic. Reports vomiting started today. Also feeling suicidal. Last vodka 1 hr PTA dt to shakes       HPI:  Latasha Cunningham is a 62 year old female with a signficant past medical history of alcohol use disorder, anxiety, major depressive disorder presenting to the ED with multiple complaints.  Patient states that she was suicidal.  Patient refused this endorse a plan.    Patient also endorses alcohol use.  Patient states she has been on a binge for the past 3 days.  Has been drinking a heavy amount of vodka.  This morning patient started vomiting.  Patient states that she started vomiting coffee-ground emesis and had melena.  She denies any fever, chest pain or shortness of breath.  Patient denies any AVH      Past Medical History:   No past medical history on file.  Past Surgical history:   No past surgical history on file.  Family History:   No family history on file.  Social History:  Social History     Socioeconomic History    Marital status: Single     Medications:   None     Allergies:   Latex    Physical Exam:    05/05/23  1249 05/05/23  1414 05/05/23  1420 05/05/23  1743   BP: 136/91  128/87 133/75   Pulse: 87 81 88 84   Temp: 97.6 F (36.4 C)  99.1 F (37.3 C) 98.7 F (37.1 C)   Resp: 17  18 16    SpO2: 97%  98% 99%     General:  Older than stated age, tearful  HEENT: normocephalic atraumatic, dMM, open and clear oropharynx, PERRL, EOMI  Neck: normal range of motion, no cervical spine tenderness, trachea midline   Cardiac: reg rate and rhythm, no gallups/rubs/murmurs, no peripheral edema, no chest wall tenderness, radial pulses 2+ b/l  Resp: regular rate, nonlabored, clear to auscultation bilaterally, no  wheezing/ronchi/rales  Abd: soft, no guarding or rigidity, nontender, normal bowel sounds  Back: No midline spine tenderness, no CVAT  MSK: no gross deformities or obvious bleeding  Neuro: A/O x 4, moves all extremities spontaneously, normal eye movements, no aphasia  Derm: no obvious rashes or lesions  Rectal no bright red blood per rectum or melena    Labs:  Labs Reviewed   COMPREHENSIVE METABOLIC PANEL, BLOOD - Abnormal; Notable for the following components:       Result Value    Chloride 97 (*)     Anion Gap 18 (*)     All other components within normal limits   ALCOHOL, BLOOD   HIV 1/2 ANTIBODY & P24 ANTIGEN ASSAY, BLOOD   LIPASE, BLOOD   MAGNESIUM, BLOOD   CBC WITH DIFF, BLOOD   APTT, BLOOD   PROTHROMBIN TIME, BLOOD   TSH, BLOOD   UR DRUGS OF ABUSE SCREEN   TYPE & SCREEN   ABO/RH CONFIRMATION   BARBITURATES, URINE, QUANT   HCV ANTIBODY WITH REFLEX QUANT   INFLUENZA A/B & SARS-COV-2 PCR COMBO FOR RAPID RESPONSE LAB    Narrative:     Collect using a flocked swab in the designated M4RT  viral  transport medium. Other swab types or VTM tubes will be  rejected.  Is this COVID-19 test being ordered as screening for a  hospitalized patient?->Yes       Imaging:  No orders to display         MDM:    Latasha Cunningham is a 62 year old female here with multiple complaints. Pt states that she is currently on a binge. Has been drinking alcohol daily.  Patient was not know how much vodka she has been drinking.  Patient states that she was also suicidal.  Started vomiting today.  Had 1 episode of coffee-ground emesis and an episode of dark stool.  On initial evaluation patient was hemodynamically stable, afebrile, regular rate and rhythm, lungs are clear to auscultation bilaterally, abdomen is soft nontender, suicidal with no plan    Given the history and physical orders placed as below.    Orders:  Orders Placed This Encounter   Procedures    Alcohol, Blood Green Plasma Separator Tube    CMP    HIV 1/2 Antibody & P24  Antigen Assay    Lipase, Blood Green Plasma Separator Tube    Magnesium, Blood Green Plasma Separator Tube    CBC w/ Diff Lavender    aPTT, Blood Blue    Prothrombin Time, Blood Blue    TSH, Blood - See Instructions    UR Drugs of Abuse Screen    Type & Screen Lavender    ABO/RH CONFIRMATION - See Instructions    Barbiturates, Urine, Quant    ECG 12 Lead       Meds administered:  Medications   pantoprazole (PROTONIX) 80 mg in sodium chloride 0.9 % 20 mL IV bolus (80 mg IntraVENOUS Given 05/05/23 1415)   lactated ringers 1,000 mL IV bolus ( IntraVENOUS Stopped 05/05/23 1458)   PHENobarbital injection 260 mg (260 mg IntraVENOUS Given 05/05/23 1528)   ondansetron (ZOFRAN) injection 4 mg (4 mg IntraVENOUS Given 05/05/23 1528)       ED Course Updates:    Workup Summary         Value Comment By Time     Blood pressure (BP): 136/91 (Reviewed) Valente David, MD 06/15 1305     Temperature: 97.6 F (36.4 C) (Reviewed) Valente David, MD 06/15 1305     Respirations: 17 (Reviewed) Valente David, MD 06/15 1305     SpO2: 97 % (Reviewed) Valente David, MD 06/15 1305     Heart Rate: 87 (Reviewed) Valente David, MD 06/15 [phone]      Rectal neg Valente David, MD 06/15 1358      S/o DW 62 year old female presenting w/ Binging for last 3 days with coffee ground emesis and melana stools and endorsing SI.    To Do    [ ]  Psyk   Larita Fife [place], [place], [place] 06/15 1429     Blood pressure (BP): 128/87 (Reviewed) Valente David, MD 06/15 1432     Temperature: 99.1 F (37.3 C) (Reviewed) Valente David, MD 06/15 1432     Heart Rate: 88 (Reviewed) Valente David, MD 06/15 1432     Respirations: 18 (Reviewed) Valente David, MD 06/15 1432     SpO2: 98 % (Reviewed) Valente David, MD 06/15 1432     CBC w/ Diff Lavender:    WBC 5.9   RBC 4.50   Hgb 14.1   Hct 41.4  MCV 92.0   MCH 31.3   MCHC 34.1   RDW 13.2   MPV 10.0   Plt Count 246   Segs 72.4   Imm Gran % 0.3    Lymphocytes 21.2   Monocytes 4.9   Eosinophils 0.5   Basophils 0.7   ANC-Automated 4.3   Abs Lymphs 1.3   Abs Monos 0.3   Abs Eosinophils 0.0   Abs Basophils 0.0   Diff Type Automated CBC without any severe derangements, baseline. No anemia, thrombocytosis/thrombocytopenia, or leukocytosis/leukopenia.    Valente David, MD 06/15 1432      Given the normal hemoglobin low suspicion for active GI bleed.  Will give patient with a PPI for op Valente David, MD 06/15 1432     PT,Patient: 9.8 (Reviewed) Valente David, MD 06/15 1440     INR: 0.9 (Reviewed) Valente David, MD 06/15 1440      Psych paged x2  Valente David, MD 06/15 [phone]      Psyk coming to see Susann Givens [address] [place], [place] 06/15 1507      States no SI now [place], [place], [place], [place] 06/15 1536      Patient states she has not having incised now feeling much better.  At this juncture discussed workups with the patient.  Patient passed p.o. trial and ambulation trial at this juncture patient discharged with strict return precautions encouraged follow up with primary care provider. [address], [place], [place], [place] 06/15 [phone]            Patient remained stable throughout their stay in the emergency department. Laboratory and imaging studies reviewed as above.  Laboratory studies were reassuring the patient was not actively bleeding.  Patient was given a dose of PPI while in the emergency department.  Will provide her with outpatient PPI as well.  Patient signed out to the oncoming team pending psychiatry    Patient to follow-up with primary care provider. Strict return precautions given to the patient. Patient understands and agrees to the plan.    ED Diagnoses:  1. Acute alcoholic gastritis, presence of bleeding unspecified  omeprazole (PRILOSEC) 20 MG capsule      2. Suicide ideation                       Discharge    I have discussed my thought process and plan for the patient with the attending  physician, hogue    Quinn Plowman, MD  Department of Emergency Medicine  [facility]         Valente David, MD  Resident  05/07/23 [id number]       Everardo Pacific, MD  05/09/23 [phone]

## 2023-05-05 NOTE — ED Notes (Signed)
Pt provided with PO snacks and fluids for PO trial.

## 2023-05-06 LAB — HCV ANTIBODY WITH REFLEX QUANT: Hepatitis C Ab: NONREACTIVE

## 2023-05-06 LAB — HIV 1/2 ANTIBODY & P24 ANTIGEN ASSAY, BLOOD: HIV 1/2 Antibody & P24 Antigen Assay: NONREACTIVE

## 2023-05-07 ENCOUNTER — Other Ambulatory Visit: Payer: Self-pay

## 2023-05-09 DIAGNOSIS — F1020 Alcohol dependence, uncomplicated: Secondary | ICD-10-CM | POA: Diagnosis not present

## 2023-05-10 DIAGNOSIS — J3089 Other allergic rhinitis: Secondary | ICD-10-CM | POA: Diagnosis not present

## 2023-05-10 DIAGNOSIS — Z9889 Other specified postprocedural states: Secondary | ICD-10-CM | POA: Diagnosis not present

## 2023-05-10 DIAGNOSIS — Z758 Other problems related to medical facilities and other health care: Secondary | ICD-10-CM | POA: Diagnosis not present

## 2023-05-10 DIAGNOSIS — F5104 Psychophysiologic insomnia: Secondary | ICD-10-CM | POA: Diagnosis not present

## 2023-05-10 DIAGNOSIS — Z79890 Hormone replacement therapy (postmenopausal): Secondary | ICD-10-CM | POA: Diagnosis not present

## 2023-05-10 DIAGNOSIS — E538 Deficiency of other specified B group vitamins: Secondary | ICD-10-CM | POA: Diagnosis not present

## 2023-05-10 DIAGNOSIS — Z780 Asymptomatic menopausal state: Secondary | ICD-10-CM | POA: Diagnosis not present

## 2023-05-10 DIAGNOSIS — F1020 Alcohol dependence, uncomplicated: Secondary | ICD-10-CM | POA: Diagnosis not present

## 2023-05-10 DIAGNOSIS — F329 Major depressive disorder, single episode, unspecified: Secondary | ICD-10-CM | POA: Diagnosis not present

## 2023-05-10 DIAGNOSIS — Z6372 Alcoholism and drug addiction in family: Secondary | ICD-10-CM | POA: Diagnosis not present

## 2023-05-10 DIAGNOSIS — Z8049 Family history of malignant neoplasm of other genital organs: Secondary | ICD-10-CM | POA: Diagnosis not present

## 2023-05-10 DIAGNOSIS — F1021 Alcohol dependence, in remission: Secondary | ICD-10-CM | POA: Diagnosis not present

## 2023-05-10 DIAGNOSIS — E559 Vitamin D deficiency, unspecified: Secondary | ICD-10-CM | POA: Diagnosis not present

## 2023-05-10 DIAGNOSIS — M1712 Unilateral primary osteoarthritis, left knee: Secondary | ICD-10-CM | POA: Diagnosis not present

## 2023-05-10 DIAGNOSIS — F419 Anxiety disorder, unspecified: Secondary | ICD-10-CM | POA: Diagnosis not present

## 2023-05-10 DIAGNOSIS — Z9882 Breast implant status: Secondary | ICD-10-CM | POA: Diagnosis not present

## 2023-05-10 DIAGNOSIS — Z7689 Persons encountering health services in other specified circumstances: Secondary | ICD-10-CM | POA: Diagnosis not present

## 2023-05-10 DIAGNOSIS — Z87410 Personal history of cervical dysplasia: Secondary | ICD-10-CM | POA: Diagnosis not present

## 2023-05-10 DIAGNOSIS — Z8639 Personal history of other endocrine, nutritional and metabolic disease: Secondary | ICD-10-CM | POA: Diagnosis not present

## 2023-05-10 DIAGNOSIS — H903 Sensorineural hearing loss, bilateral: Secondary | ICD-10-CM | POA: Diagnosis not present

## 2023-05-10 DIAGNOSIS — Z803 Family history of malignant neoplasm of breast: Secondary | ICD-10-CM | POA: Diagnosis not present

## 2023-05-10 LAB — BARBITURATES, URINE, QUANT
Butalbital, Urine, Quant: <50 ng/mL
Pentobarbital, Urine, Quant: <50 ng/mL
Phenobarbital, Urine, Quant: >5000 ng/mL

## 2023-05-11 DIAGNOSIS — M25562 Pain in left knee: Secondary | ICD-10-CM | POA: Diagnosis not present

## 2023-05-11 DIAGNOSIS — Z1231 Encounter for screening mammogram for malignant neoplasm of breast: Secondary | ICD-10-CM | POA: Diagnosis not present

## 2023-05-11 DIAGNOSIS — M1712 Unilateral primary osteoarthritis, left knee: Secondary | ICD-10-CM | POA: Diagnosis not present

## 2023-05-17 DIAGNOSIS — F419 Anxiety disorder, unspecified: Secondary | ICD-10-CM | POA: Diagnosis not present

## 2023-05-17 DIAGNOSIS — F1010 Alcohol abuse, uncomplicated: Secondary | ICD-10-CM | POA: Diagnosis not present

## 2023-05-17 DIAGNOSIS — F32A Depression, unspecified: Secondary | ICD-10-CM | POA: Diagnosis not present

## 2023-05-17 DIAGNOSIS — F10230 Alcohol dependence with withdrawal, uncomplicated: Secondary | ICD-10-CM | POA: Diagnosis not present

## 2023-05-17 DIAGNOSIS — Y92481 Parking lot as the place of occurrence of the external cause: Secondary | ICD-10-CM | POA: Diagnosis not present

## 2023-05-17 DIAGNOSIS — T447X1A Poisoning by beta-adrenoreceptor antagonists, accidental (unintentional), initial encounter: Secondary | ICD-10-CM | POA: Diagnosis not present

## 2023-05-17 DIAGNOSIS — R45851 Suicidal ideations: Secondary | ICD-10-CM | POA: Diagnosis not present

## 2023-05-17 DIAGNOSIS — T447X4A Poisoning by beta-adrenoreceptor antagonists, undetermined, initial encounter: Secondary | ICD-10-CM | POA: Diagnosis not present

## 2023-05-17 DIAGNOSIS — Y908 Blood alcohol level of 240 mg/100 ml or more: Secondary | ICD-10-CM | POA: Diagnosis not present

## 2023-05-17 DIAGNOSIS — Z6379 Other stressful life events affecting family and household: Secondary | ICD-10-CM | POA: Diagnosis not present

## 2023-05-17 DIAGNOSIS — R7981 Abnormal blood-gas level: Secondary | ICD-10-CM | POA: Diagnosis not present

## 2023-05-17 DIAGNOSIS — T447X5A Adverse effect of beta-adrenoreceptor antagonists, initial encounter: Secondary | ICD-10-CM | POA: Diagnosis not present

## 2023-05-18 DIAGNOSIS — F419 Anxiety disorder, unspecified: Secondary | ICD-10-CM | POA: Diagnosis not present

## 2023-05-18 DIAGNOSIS — X58XXXA Exposure to other specified factors, initial encounter: Secondary | ICD-10-CM | POA: Diagnosis not present

## 2023-05-18 DIAGNOSIS — F10129 Alcohol abuse with intoxication, unspecified: Secondary | ICD-10-CM | POA: Diagnosis not present

## 2023-05-18 DIAGNOSIS — T50991A Poisoning by other drugs, medicaments and biological substances, accidental (unintentional), initial encounter: Secondary | ICD-10-CM | POA: Diagnosis not present

## 2023-05-18 DIAGNOSIS — F32A Depression, unspecified: Secondary | ICD-10-CM | POA: Diagnosis not present

## 2023-05-18 DIAGNOSIS — T50904A Poisoning by unspecified drugs, medicaments and biological substances, undetermined, initial encounter: Secondary | ICD-10-CM | POA: Diagnosis not present

## 2023-05-18 DIAGNOSIS — Y908 Blood alcohol level of 240 mg/100 ml or more: Secondary | ICD-10-CM | POA: Diagnosis not present

## 2023-05-18 DIAGNOSIS — F1090 Alcohol use, unspecified, uncomplicated: Secondary | ICD-10-CM | POA: Diagnosis not present

## 2023-05-18 DIAGNOSIS — F10229 Alcohol dependence with intoxication, unspecified: Secondary | ICD-10-CM | POA: Diagnosis not present

## 2023-05-18 DIAGNOSIS — Z79899 Other long term (current) drug therapy: Secondary | ICD-10-CM | POA: Diagnosis not present

## 2023-05-19 DIAGNOSIS — T50904A Poisoning by unspecified drugs, medicaments and biological substances, undetermined, initial encounter: Secondary | ICD-10-CM | POA: Diagnosis not present

## 2023-05-19 DIAGNOSIS — Y908 Blood alcohol level of 240 mg/100 ml or more: Secondary | ICD-10-CM | POA: Diagnosis not present

## 2023-05-19 DIAGNOSIS — F32A Depression, unspecified: Secondary | ICD-10-CM | POA: Diagnosis not present

## 2023-05-19 DIAGNOSIS — F10129 Alcohol abuse with intoxication, unspecified: Secondary | ICD-10-CM | POA: Diagnosis not present

## 2023-05-19 DIAGNOSIS — F10229 Alcohol dependence with intoxication, unspecified: Secondary | ICD-10-CM | POA: Diagnosis not present

## 2023-05-19 DIAGNOSIS — F419 Anxiety disorder, unspecified: Secondary | ICD-10-CM | POA: Diagnosis not present

## 2023-05-19 DIAGNOSIS — F1090 Alcohol use, unspecified, uncomplicated: Secondary | ICD-10-CM | POA: Diagnosis not present

## 2023-05-19 DIAGNOSIS — Z79899 Other long term (current) drug therapy: Secondary | ICD-10-CM | POA: Diagnosis not present

## 2023-05-19 DIAGNOSIS — T50991A Poisoning by other drugs, medicaments and biological substances, accidental (unintentional), initial encounter: Secondary | ICD-10-CM | POA: Diagnosis not present

## 2023-05-19 DIAGNOSIS — X58XXXA Exposure to other specified factors, initial encounter: Secondary | ICD-10-CM | POA: Diagnosis not present

## 2023-05-20 DIAGNOSIS — F1020 Alcohol dependence, uncomplicated: Secondary | ICD-10-CM | POA: Diagnosis not present

## 2023-05-20 DIAGNOSIS — F331 Major depressive disorder, recurrent, moderate: Secondary | ICD-10-CM | POA: Diagnosis not present

## 2023-05-20 DIAGNOSIS — F1090 Alcohol use, unspecified, uncomplicated: Secondary | ICD-10-CM | POA: Diagnosis not present

## 2023-05-20 DIAGNOSIS — Z008 Encounter for other general examination: Secondary | ICD-10-CM | POA: Diagnosis not present

## 2023-05-20 DIAGNOSIS — F419 Anxiety disorder, unspecified: Secondary | ICD-10-CM | POA: Diagnosis not present

## 2023-05-20 DIAGNOSIS — X58XXXA Exposure to other specified factors, initial encounter: Secondary | ICD-10-CM | POA: Diagnosis not present

## 2023-05-20 DIAGNOSIS — T50991A Poisoning by other drugs, medicaments and biological substances, accidental (unintentional), initial encounter: Secondary | ICD-10-CM | POA: Diagnosis not present

## 2023-05-20 DIAGNOSIS — F411 Generalized anxiety disorder: Secondary | ICD-10-CM | POA: Diagnosis not present

## 2023-05-20 DIAGNOSIS — H906 Mixed conductive and sensorineural hearing loss, bilateral: Secondary | ICD-10-CM | POA: Diagnosis not present

## 2023-05-20 DIAGNOSIS — T50904A Poisoning by unspecified drugs, medicaments and biological substances, undetermined, initial encounter: Secondary | ICD-10-CM | POA: Diagnosis not present

## 2023-05-20 DIAGNOSIS — Z79899 Other long term (current) drug therapy: Secondary | ICD-10-CM | POA: Diagnosis not present

## 2023-05-20 DIAGNOSIS — Z5901 Sheltered homelessness: Secondary | ICD-10-CM | POA: Diagnosis not present

## 2023-05-20 DIAGNOSIS — J310 Chronic rhinitis: Secondary | ICD-10-CM | POA: Diagnosis not present

## 2023-05-20 DIAGNOSIS — Z79890 Hormone replacement therapy (postmenopausal): Secondary | ICD-10-CM | POA: Diagnosis not present

## 2023-05-20 DIAGNOSIS — F10229 Alcohol dependence with intoxication, unspecified: Secondary | ICD-10-CM | POA: Diagnosis not present

## 2023-05-20 DIAGNOSIS — R45851 Suicidal ideations: Secondary | ICD-10-CM | POA: Diagnosis not present

## 2023-05-20 DIAGNOSIS — G8929 Other chronic pain: Secondary | ICD-10-CM | POA: Diagnosis not present

## 2023-05-20 DIAGNOSIS — F10129 Alcohol abuse with intoxication, unspecified: Secondary | ICD-10-CM | POA: Diagnosis not present

## 2023-05-20 DIAGNOSIS — M25562 Pain in left knee: Secondary | ICD-10-CM | POA: Diagnosis not present

## 2023-05-20 DIAGNOSIS — Y908 Blood alcohol level of 240 mg/100 ml or more: Secondary | ICD-10-CM | POA: Diagnosis not present

## 2023-05-20 DIAGNOSIS — F32A Depression, unspecified: Secondary | ICD-10-CM | POA: Diagnosis not present

## 2023-05-30 DIAGNOSIS — F331 Major depressive disorder, recurrent, moderate: Secondary | ICD-10-CM | POA: Diagnosis not present

## 2023-05-31 ENCOUNTER — Telehealth: Payer: Self-pay

## 2023-05-31 DIAGNOSIS — Z600 Problems of adjustment to life-cycle transitions: Secondary | ICD-10-CM | POA: Diagnosis not present

## 2023-05-31 DIAGNOSIS — Z6372 Alcoholism and drug addiction in family: Secondary | ICD-10-CM | POA: Diagnosis not present

## 2023-05-31 DIAGNOSIS — F1020 Alcohol dependence, uncomplicated: Secondary | ICD-10-CM | POA: Diagnosis not present

## 2023-05-31 DIAGNOSIS — Z638 Other specified problems related to primary support group: Secondary | ICD-10-CM | POA: Diagnosis not present

## 2023-05-31 NOTE — Transitions of Care (Post Inpatient/ED Visit) (Signed)
   05/31/2023  Name: WINNI EHRHARD MRN: [id number] DOB: 1961-09-26  Today's TOC FU Call Status: Today's TOC FU Call Status:: Unsuccessul Call (1st Attempt) Unsuccessful Call (1st Attempt) Date: 05/31/23  Attempted to reach the patient regarding the most recent Inpatient/ED visit.  Follow Up Plan: Additional outreach attempts will be made to reach the patient to complete the Transitions of Care (Post Inpatient/ED visit) call.      Antionette Fairy, RN,BSN,CCM [facility] Care Management Care Management Community Coordinator Direct Phone: [phone] Toll Free: [phone] Fax: [phone]

## 2023-06-01 ENCOUNTER — Telehealth: Payer: Self-pay

## 2023-06-01 NOTE — Transitions of Care (Post Inpatient/ED Visit) (Signed)
   06/01/2023  Name: Debra Rhodes MRN: [id number] DOB: [birth year]  Today's TOC FU Call Status: Today's TOC FU Call Status:: Unsuccessful Call (2nd Attempt) Unsuccessful Call (2nd Attempt) Date: 06/01/23  Attempted to reach the patient regarding the most recent Inpatient/ED visit.  Follow Up Plan: Additional outreach attempts will be made to reach the patient to complete the Transitions of Care (Post Inpatient/ED visit) call.      Antionette Fairy, RN,BSN,CCM [facility] Care Management Care Management Community Coordinator Direct Phone: [phone] Toll Free: [phone] Fax: [phone]

## 2023-06-01 NOTE — Transitions of Care (Post Inpatient/ED Visit) (Signed)
   06/01/2023  Name: MUSLIMAH HASSEL MRN: [id number] DOB: 10/12/1961  Today's TOC FU Call Status: Today's TOC FU Call Status:: Unsuccessful Call (3rd Attempt) Unsuccessful Call (3rd Attempt) Date: 06/01/23  Attempted to reach the patient regarding the most recent Inpatient/ED visit.  Follow Up Plan: No further outreach attempts will be made at this time. We have been unable to contact the patient.     Antionette Fairy, RN,BSN,CCM [facility] Care Management Care Management Community Coordinator Direct Phone: [phone] Toll Free: [phone] Fax: [phone]

## 2023-06-02 DIAGNOSIS — F1020 Alcohol dependence, uncomplicated: Secondary | ICD-10-CM | POA: Diagnosis not present

## 2023-06-04 DIAGNOSIS — F1020 Alcohol dependence, uncomplicated: Secondary | ICD-10-CM | POA: Diagnosis not present

## 2023-06-05 DIAGNOSIS — F1020 Alcohol dependence, uncomplicated: Secondary | ICD-10-CM | POA: Diagnosis not present

## 2023-06-09 DIAGNOSIS — E162 Hypoglycemia, unspecified: Secondary | ICD-10-CM | POA: Diagnosis not present

## 2023-06-09 DIAGNOSIS — R531 Weakness: Secondary | ICD-10-CM | POA: Diagnosis not present

## 2023-06-09 DIAGNOSIS — Y908 Blood alcohol level of 240 mg/100 ml or more: Secondary | ICD-10-CM | POA: Diagnosis not present

## 2023-06-09 DIAGNOSIS — F1010 Alcohol abuse, uncomplicated: Secondary | ICD-10-CM | POA: Diagnosis not present

## 2023-06-09 DIAGNOSIS — Z79899 Other long term (current) drug therapy: Secondary | ICD-10-CM | POA: Diagnosis not present

## 2023-06-09 DIAGNOSIS — F10120 Alcohol abuse with intoxication, uncomplicated: Secondary | ICD-10-CM | POA: Diagnosis not present

## 2023-06-09 DIAGNOSIS — U071 COVID-19: Secondary | ICD-10-CM | POA: Diagnosis not present

## 2023-06-09 DIAGNOSIS — R110 Nausea: Secondary | ICD-10-CM | POA: Diagnosis not present

## 2023-06-12 DIAGNOSIS — F1020 Alcohol dependence, uncomplicated: Secondary | ICD-10-CM | POA: Diagnosis not present

## 2023-06-12 DIAGNOSIS — Z6372 Alcoholism and drug addiction in family: Secondary | ICD-10-CM | POA: Diagnosis not present

## 2023-06-12 DIAGNOSIS — Z600 Problems of adjustment to life-cycle transitions: Secondary | ICD-10-CM | POA: Diagnosis not present

## 2023-06-12 DIAGNOSIS — Z638 Other specified problems related to primary support group: Secondary | ICD-10-CM | POA: Diagnosis not present

## 2023-06-14 DIAGNOSIS — F1020 Alcohol dependence, uncomplicated: Secondary | ICD-10-CM | POA: Diagnosis not present

## 2023-06-15 DIAGNOSIS — F1020 Alcohol dependence, uncomplicated: Secondary | ICD-10-CM | POA: Diagnosis not present

## 2023-06-16 DIAGNOSIS — F1020 Alcohol dependence, uncomplicated: Secondary | ICD-10-CM | POA: Diagnosis not present

## 2023-06-18 DIAGNOSIS — F1020 Alcohol dependence, uncomplicated: Secondary | ICD-10-CM | POA: Diagnosis not present

## 2023-06-19 DIAGNOSIS — F1020 Alcohol dependence, uncomplicated: Secondary | ICD-10-CM | POA: Diagnosis not present

## 2023-06-20 DIAGNOSIS — F1020 Alcohol dependence, uncomplicated: Secondary | ICD-10-CM | POA: Diagnosis not present

## 2023-06-21 DIAGNOSIS — F1020 Alcohol dependence, uncomplicated: Secondary | ICD-10-CM | POA: Diagnosis not present

## 2023-06-22 DIAGNOSIS — Z600 Problems of adjustment to life-cycle transitions: Secondary | ICD-10-CM | POA: Diagnosis not present

## 2023-06-22 DIAGNOSIS — F1020 Alcohol dependence, uncomplicated: Secondary | ICD-10-CM | POA: Diagnosis not present

## 2023-06-22 DIAGNOSIS — Z6372 Alcoholism and drug addiction in family: Secondary | ICD-10-CM | POA: Diagnosis not present

## 2023-06-23 DIAGNOSIS — F1020 Alcohol dependence, uncomplicated: Secondary | ICD-10-CM | POA: Diagnosis not present

## 2023-06-26 DIAGNOSIS — F1020 Alcohol dependence, uncomplicated: Secondary | ICD-10-CM | POA: Diagnosis not present

## 2023-06-28 DIAGNOSIS — Z600 Problems of adjustment to life-cycle transitions: Secondary | ICD-10-CM | POA: Diagnosis not present

## 2023-06-28 DIAGNOSIS — Z6372 Alcoholism and drug addiction in family: Secondary | ICD-10-CM | POA: Diagnosis not present

## 2023-06-28 DIAGNOSIS — F1020 Alcohol dependence, uncomplicated: Secondary | ICD-10-CM | POA: Diagnosis not present

## 2023-06-29 DIAGNOSIS — F1020 Alcohol dependence, uncomplicated: Secondary | ICD-10-CM | POA: Diagnosis not present

## 2023-06-30 DIAGNOSIS — F1020 Alcohol dependence, uncomplicated: Secondary | ICD-10-CM | POA: Diagnosis not present

## 2023-07-03 DIAGNOSIS — F1020 Alcohol dependence, uncomplicated: Secondary | ICD-10-CM | POA: Diagnosis not present

## 2023-07-04 DIAGNOSIS — F1020 Alcohol dependence, uncomplicated: Secondary | ICD-10-CM | POA: Diagnosis not present

## 2023-07-05 DIAGNOSIS — F1020 Alcohol dependence, uncomplicated: Secondary | ICD-10-CM | POA: Diagnosis not present

## 2023-07-07 DIAGNOSIS — F1020 Alcohol dependence, uncomplicated: Secondary | ICD-10-CM | POA: Diagnosis not present

## 2023-07-09 DIAGNOSIS — F1020 Alcohol dependence, uncomplicated: Secondary | ICD-10-CM | POA: Diagnosis not present

## 2023-07-10 DIAGNOSIS — F1020 Alcohol dependence, uncomplicated: Secondary | ICD-10-CM | POA: Diagnosis not present

## 2023-07-12 DIAGNOSIS — F1020 Alcohol dependence, uncomplicated: Secondary | ICD-10-CM | POA: Diagnosis not present

## 2023-07-13 DIAGNOSIS — M25562 Pain in left knee: Secondary | ICD-10-CM | POA: Diagnosis not present

## 2023-07-13 DIAGNOSIS — F1020 Alcohol dependence, uncomplicated: Secondary | ICD-10-CM | POA: Diagnosis not present

## 2023-07-13 DIAGNOSIS — M1712 Unilateral primary osteoarthritis, left knee: Secondary | ICD-10-CM | POA: Diagnosis not present

## 2023-07-14 DIAGNOSIS — F1020 Alcohol dependence, uncomplicated: Secondary | ICD-10-CM | POA: Diagnosis not present

## 2023-07-16 DIAGNOSIS — F1020 Alcohol dependence, uncomplicated: Secondary | ICD-10-CM | POA: Diagnosis not present

## 2023-07-17 DIAGNOSIS — F1020 Alcohol dependence, uncomplicated: Secondary | ICD-10-CM | POA: Diagnosis not present

## 2023-07-18 DIAGNOSIS — F1020 Alcohol dependence, uncomplicated: Secondary | ICD-10-CM | POA: Diagnosis not present

## 2023-07-19 DIAGNOSIS — F1020 Alcohol dependence, uncomplicated: Secondary | ICD-10-CM | POA: Diagnosis not present

## 2023-07-21 DIAGNOSIS — F1020 Alcohol dependence, uncomplicated: Secondary | ICD-10-CM | POA: Diagnosis not present

## 2023-07-24 DIAGNOSIS — F1020 Alcohol dependence, uncomplicated: Secondary | ICD-10-CM | POA: Diagnosis not present

## 2023-07-26 DIAGNOSIS — F1020 Alcohol dependence, uncomplicated: Secondary | ICD-10-CM | POA: Diagnosis not present

## 2023-07-28 DIAGNOSIS — F1020 Alcohol dependence, uncomplicated: Secondary | ICD-10-CM | POA: Diagnosis not present

## 2023-07-30 DIAGNOSIS — F1020 Alcohol dependence, uncomplicated: Secondary | ICD-10-CM | POA: Diagnosis not present

## 2023-07-31 DIAGNOSIS — F1020 Alcohol dependence, uncomplicated: Secondary | ICD-10-CM | POA: Diagnosis not present

## 2023-08-01 DIAGNOSIS — F1020 Alcohol dependence, uncomplicated: Secondary | ICD-10-CM | POA: Diagnosis not present

## 2023-08-02 DIAGNOSIS — F1020 Alcohol dependence, uncomplicated: Secondary | ICD-10-CM | POA: Diagnosis not present

## 2023-08-02 DIAGNOSIS — Z79899 Other long term (current) drug therapy: Secondary | ICD-10-CM | POA: Diagnosis not present

## 2023-08-07 DIAGNOSIS — F1020 Alcohol dependence, uncomplicated: Secondary | ICD-10-CM | POA: Diagnosis not present

## 2023-08-08 DIAGNOSIS — F1020 Alcohol dependence, uncomplicated: Secondary | ICD-10-CM | POA: Diagnosis not present

## 2023-08-09 DIAGNOSIS — F1020 Alcohol dependence, uncomplicated: Secondary | ICD-10-CM | POA: Diagnosis not present

## 2023-08-14 DIAGNOSIS — F1020 Alcohol dependence, uncomplicated: Secondary | ICD-10-CM | POA: Diagnosis not present

## 2023-08-16 DIAGNOSIS — M1712 Unilateral primary osteoarthritis, left knee: Secondary | ICD-10-CM | POA: Diagnosis not present

## 2023-08-16 DIAGNOSIS — F1020 Alcohol dependence, uncomplicated: Secondary | ICD-10-CM | POA: Diagnosis not present

## 2023-08-21 DIAGNOSIS — F1020 Alcohol dependence, uncomplicated: Secondary | ICD-10-CM | POA: Diagnosis not present

## 2023-08-23 DIAGNOSIS — F1020 Alcohol dependence, uncomplicated: Secondary | ICD-10-CM | POA: Diagnosis not present

## 2023-08-31 ENCOUNTER — Other Ambulatory Visit: Payer: Self-pay | Admitting: Family

## 2023-08-31 DIAGNOSIS — Z79890 Hormone replacement therapy (postmenopausal): Secondary | ICD-10-CM

## 2023-09-04 DIAGNOSIS — F1020 Alcohol dependence, uncomplicated: Secondary | ICD-10-CM | POA: Diagnosis not present

## 2023-09-06 DIAGNOSIS — F1020 Alcohol dependence, uncomplicated: Secondary | ICD-10-CM | POA: Diagnosis not present

## 2023-09-11 DIAGNOSIS — F1020 Alcohol dependence, uncomplicated: Secondary | ICD-10-CM | POA: Diagnosis not present

## 2023-09-13 DIAGNOSIS — T7840XA Allergy, unspecified, initial encounter: Secondary | ICD-10-CM | POA: Diagnosis not present

## 2023-09-13 DIAGNOSIS — F1090 Alcohol use, unspecified, uncomplicated: Secondary | ICD-10-CM | POA: Diagnosis not present

## 2023-09-13 DIAGNOSIS — F1099 Alcohol use, unspecified with unspecified alcohol-induced disorder: Secondary | ICD-10-CM | POA: Diagnosis not present

## 2023-09-13 DIAGNOSIS — R251 Tremor, unspecified: Secondary | ICD-10-CM | POA: Diagnosis not present

## 2023-09-13 DIAGNOSIS — R42 Dizziness and giddiness: Secondary | ICD-10-CM | POA: Diagnosis not present

## 2023-09-13 DIAGNOSIS — R000 Tachycardia, unspecified: Secondary | ICD-10-CM | POA: Diagnosis not present

## 2023-09-18 DIAGNOSIS — F1020 Alcohol dependence, uncomplicated: Secondary | ICD-10-CM | POA: Diagnosis not present

## 2023-09-20 DIAGNOSIS — F1020 Alcohol dependence, uncomplicated: Secondary | ICD-10-CM | POA: Diagnosis not present

## 2023-09-22 DIAGNOSIS — F1020 Alcohol dependence, uncomplicated: Secondary | ICD-10-CM | POA: Diagnosis not present

## 2023-09-25 DIAGNOSIS — F1020 Alcohol dependence, uncomplicated: Secondary | ICD-10-CM | POA: Diagnosis not present

## 2023-09-27 DIAGNOSIS — F1020 Alcohol dependence, uncomplicated: Secondary | ICD-10-CM | POA: Diagnosis not present

## 2023-09-29 DIAGNOSIS — F1020 Alcohol dependence, uncomplicated: Secondary | ICD-10-CM | POA: Diagnosis not present

## 2023-10-02 DIAGNOSIS — F1020 Alcohol dependence, uncomplicated: Secondary | ICD-10-CM | POA: Diagnosis not present

## 2023-10-03 ENCOUNTER — Encounter: Payer: Self-pay | Admitting: Psychiatry

## 2023-10-04 DIAGNOSIS — F1020 Alcohol dependence, uncomplicated: Secondary | ICD-10-CM | POA: Diagnosis not present

## 2023-10-06 DIAGNOSIS — F1020 Alcohol dependence, uncomplicated: Secondary | ICD-10-CM | POA: Diagnosis not present

## 2023-10-09 DIAGNOSIS — F1020 Alcohol dependence, uncomplicated: Secondary | ICD-10-CM | POA: Diagnosis not present

## 2023-10-11 DIAGNOSIS — F1020 Alcohol dependence, uncomplicated: Secondary | ICD-10-CM | POA: Diagnosis not present

## 2023-10-16 DIAGNOSIS — F1020 Alcohol dependence, uncomplicated: Secondary | ICD-10-CM | POA: Diagnosis not present

## 2023-10-23 DIAGNOSIS — F1020 Alcohol dependence, uncomplicated: Secondary | ICD-10-CM | POA: Diagnosis not present

## 2023-10-25 DIAGNOSIS — F1020 Alcohol dependence, uncomplicated: Secondary | ICD-10-CM | POA: Diagnosis not present

## 2023-10-30 DIAGNOSIS — F1020 Alcohol dependence, uncomplicated: Secondary | ICD-10-CM | POA: Diagnosis not present

## 2023-11-01 DIAGNOSIS — F1020 Alcohol dependence, uncomplicated: Secondary | ICD-10-CM | POA: Diagnosis not present

## 2023-11-06 DIAGNOSIS — F1020 Alcohol dependence, uncomplicated: Secondary | ICD-10-CM | POA: Diagnosis not present

## 2023-11-15 DIAGNOSIS — F1020 Alcohol dependence, uncomplicated: Secondary | ICD-10-CM | POA: Diagnosis not present

## 2023-11-22 DIAGNOSIS — F1020 Alcohol dependence, uncomplicated: Secondary | ICD-10-CM | POA: Diagnosis not present

## 2023-12-06 DIAGNOSIS — F1020 Alcohol dependence, uncomplicated: Secondary | ICD-10-CM | POA: Diagnosis not present

## 2023-12-13 DIAGNOSIS — F1020 Alcohol dependence, uncomplicated: Secondary | ICD-10-CM | POA: Diagnosis not present

## 2023-12-20 DIAGNOSIS — F1020 Alcohol dependence, uncomplicated: Secondary | ICD-10-CM | POA: Diagnosis not present

## 2023-12-27 DIAGNOSIS — F1020 Alcohol dependence, uncomplicated: Secondary | ICD-10-CM | POA: Diagnosis not present

## 2024-01-03 DIAGNOSIS — F1020 Alcohol dependence, uncomplicated: Secondary | ICD-10-CM | POA: Diagnosis not present

## 2024-01-14 DIAGNOSIS — L84 Corns and callosities: Secondary | ICD-10-CM | POA: Diagnosis not present

## 2024-01-14 DIAGNOSIS — L814 Other melanin hyperpigmentation: Secondary | ICD-10-CM | POA: Diagnosis not present

## 2024-01-14 DIAGNOSIS — D100 Benign neoplasm of lip: Secondary | ICD-10-CM | POA: Diagnosis not present

## 2024-01-24 DIAGNOSIS — F1020 Alcohol dependence, uncomplicated: Secondary | ICD-10-CM | POA: Diagnosis not present

## 2024-01-31 DIAGNOSIS — F1020 Alcohol dependence, uncomplicated: Secondary | ICD-10-CM | POA: Diagnosis not present

## 2024-02-07 DIAGNOSIS — F1020 Alcohol dependence, uncomplicated: Secondary | ICD-10-CM | POA: Diagnosis not present

## 2024-02-12 DIAGNOSIS — F1020 Alcohol dependence, uncomplicated: Secondary | ICD-10-CM | POA: Diagnosis not present

## 2024-05-27 ENCOUNTER — Other Ambulatory Visit: Payer: Self-pay | Admitting: Family

## 2024-05-27 DIAGNOSIS — Z79890 Hormone replacement therapy (postmenopausal): Secondary | ICD-10-CM

## 2024-06-06 ENCOUNTER — Other Ambulatory Visit: Payer: Self-pay | Admitting: Family

## 2024-06-06 DIAGNOSIS — Z79890 Hormone replacement therapy (postmenopausal): Secondary | ICD-10-CM

## 2024-07-05 ENCOUNTER — Other Ambulatory Visit: Payer: Self-pay | Admitting: Family

## 2024-07-05 DIAGNOSIS — Z79890 Hormone replacement therapy (postmenopausal): Secondary | ICD-10-CM

## 2024-07-07 NOTE — Telephone Encounter (Signed)
 Last OV 01/05/23 - acute CPE 09/28/22  Pt has not been seen in > 1 year, needs OV. Please reach out to assist with scheduling.

## 2024-07-18 ENCOUNTER — Other Ambulatory Visit: Payer: Self-pay | Admitting: Family

## 2024-07-18 DIAGNOSIS — Z79890 Hormone replacement therapy (postmenopausal): Secondary | ICD-10-CM

## 2024-08-10 ENCOUNTER — Other Ambulatory Visit: Payer: Self-pay | Admitting: Family

## 2024-08-10 DIAGNOSIS — Z79890 Hormone replacement therapy (postmenopausal): Secondary | ICD-10-CM

## 2024-09-06 ENCOUNTER — Other Ambulatory Visit: Payer: Self-pay | Admitting: Family

## 2024-09-06 DIAGNOSIS — Z79890 Hormone replacement therapy (postmenopausal): Secondary | ICD-10-CM

## 2024-10-15 ENCOUNTER — Other Ambulatory Visit: Payer: Self-pay | Admitting: Family

## 2024-10-15 DIAGNOSIS — Z79890 Hormone replacement therapy (postmenopausal): Secondary | ICD-10-CM

## 2024-11-17 ENCOUNTER — Other Ambulatory Visit: Payer: Self-pay | Admitting: Family

## 2024-11-17 DIAGNOSIS — Z79890 Hormone replacement therapy (postmenopausal): Secondary | ICD-10-CM

## 2024-12-10 ENCOUNTER — Other Ambulatory Visit: Payer: Self-pay | Admitting: Family

## 2024-12-10 DIAGNOSIS — R21 Rash and other nonspecific skin eruption: Secondary | ICD-10-CM

## 2024-12-13 ENCOUNTER — Other Ambulatory Visit: Payer: Self-pay | Admitting: Family

## 2024-12-13 DIAGNOSIS — Z79890 Hormone replacement therapy (postmenopausal): Secondary | ICD-10-CM
